# Patient Record
Sex: Female | Born: 1935 | Race: White | Hispanic: No | Smoking: Never smoker
Health system: Southern US, Community
[De-identification: ages and names within clinical notes are randomized; demographics above are authoritative.]

## PROBLEM LIST (undated history)

## (undated) DIAGNOSIS — M79602 Pain in left arm: Secondary | ICD-10-CM

## (undated) DIAGNOSIS — I10 Essential (primary) hypertension: Secondary | ICD-10-CM

## (undated) DIAGNOSIS — E785 Hyperlipidemia, unspecified: Secondary | ICD-10-CM

## (undated) DIAGNOSIS — Z90710 Acquired absence of both cervix and uterus: Secondary | ICD-10-CM

## (undated) DIAGNOSIS — M545 Low back pain, unspecified: Secondary | ICD-10-CM

## (undated) DIAGNOSIS — C439 Malignant melanoma of skin, unspecified: Secondary | ICD-10-CM

## (undated) DIAGNOSIS — F329 Major depressive disorder, single episode, unspecified: Secondary | ICD-10-CM

## (undated) DIAGNOSIS — R55 Syncope and collapse: Secondary | ICD-10-CM

## (undated) DIAGNOSIS — E119 Type 2 diabetes mellitus without complications: Secondary | ICD-10-CM

## (undated) DIAGNOSIS — F32A Depression, unspecified: Secondary | ICD-10-CM

## (undated) HISTORY — DX: Depression, unspecified: F32.A

## (undated) HISTORY — DX: Pain in left arm: M79.602

## (undated) HISTORY — DX: Low back pain, unspecified: M54.50

## (undated) HISTORY — DX: Syncope and collapse: R55

## (undated) HISTORY — DX: Type 2 diabetes mellitus without complications: E11.9

## (undated) HISTORY — DX: Malignant melanoma of skin, unspecified: C43.9

## (undated) HISTORY — DX: Acquired absence of both cervix and uterus: Z90.710

## (undated) HISTORY — DX: Major depressive disorder, single episode, unspecified: F32.9

## (undated) HISTORY — DX: Low back pain: M54.5

---

## 1971-08-11 DIAGNOSIS — Z90710 Acquired absence of both cervix and uterus: Secondary | ICD-10-CM

## 1971-08-11 HISTORY — DX: Acquired absence of both cervix and uterus: Z90.710

## 1971-08-11 HISTORY — PX: ABDOMINAL HYSTERECTOMY: SHX81

## 1983-08-11 HISTORY — PX: BREAST BIOPSY: SHX20

## 2000-01-01 ENCOUNTER — Encounter: Payer: Self-pay | Admitting: Gastroenterology

## 2000-01-01 ENCOUNTER — Ambulatory Visit (HOSPITAL_COMMUNITY): Admission: RE | Admit: 2000-01-01 | Discharge: 2000-01-01 | Payer: Self-pay | Admitting: Gastroenterology

## 2001-03-22 ENCOUNTER — Ambulatory Visit (HOSPITAL_COMMUNITY): Admission: RE | Admit: 2001-03-22 | Discharge: 2001-03-22 | Payer: Self-pay | Admitting: General Surgery

## 2001-03-22 ENCOUNTER — Encounter: Payer: Self-pay | Admitting: General Surgery

## 2001-03-23 ENCOUNTER — Encounter: Payer: Self-pay | Admitting: General Surgery

## 2001-03-25 ENCOUNTER — Encounter (INDEPENDENT_AMBULATORY_CARE_PROVIDER_SITE_OTHER): Payer: Self-pay | Admitting: *Deleted

## 2001-03-25 ENCOUNTER — Ambulatory Visit (HOSPITAL_COMMUNITY): Admission: RE | Admit: 2001-03-25 | Discharge: 2001-03-26 | Payer: Self-pay | Admitting: General Surgery

## 2001-05-26 ENCOUNTER — Encounter: Admission: RE | Admit: 2001-05-26 | Discharge: 2001-05-26 | Payer: Self-pay | Admitting: Orthopaedic Surgery

## 2001-05-26 ENCOUNTER — Encounter: Payer: Self-pay | Admitting: Orthopaedic Surgery

## 2001-06-06 ENCOUNTER — Encounter: Admission: RE | Admit: 2001-06-06 | Discharge: 2001-06-06 | Payer: Self-pay | Admitting: Orthopaedic Surgery

## 2001-06-06 ENCOUNTER — Encounter: Payer: Self-pay | Admitting: Orthopaedic Surgery

## 2001-06-16 ENCOUNTER — Encounter: Payer: Self-pay | Admitting: Orthopaedic Surgery

## 2001-06-16 ENCOUNTER — Encounter: Admission: RE | Admit: 2001-06-16 | Discharge: 2001-06-16 | Payer: Self-pay | Admitting: Orthopaedic Surgery

## 2001-06-29 ENCOUNTER — Encounter: Payer: Self-pay | Admitting: Orthopaedic Surgery

## 2001-06-29 ENCOUNTER — Encounter: Admission: RE | Admit: 2001-06-29 | Discharge: 2001-06-29 | Payer: Self-pay | Admitting: Orthopaedic Surgery

## 2001-07-13 ENCOUNTER — Encounter: Admission: RE | Admit: 2001-07-13 | Discharge: 2001-07-13 | Payer: Self-pay | Admitting: Orthopaedic Surgery

## 2001-07-13 ENCOUNTER — Encounter: Payer: Self-pay | Admitting: Orthopaedic Surgery

## 2001-08-10 HISTORY — PX: JOINT REPLACEMENT: SHX530

## 2001-08-10 HISTORY — PX: TOTAL HIP ARTHROPLASTY: SHX124

## 2001-11-03 ENCOUNTER — Encounter (INDEPENDENT_AMBULATORY_CARE_PROVIDER_SITE_OTHER): Payer: Self-pay | Admitting: *Deleted

## 2001-11-03 ENCOUNTER — Encounter: Payer: Self-pay | Admitting: Orthopaedic Surgery

## 2001-11-03 ENCOUNTER — Inpatient Hospital Stay (HOSPITAL_COMMUNITY): Admission: RE | Admit: 2001-11-03 | Discharge: 2001-11-07 | Payer: Self-pay | Admitting: Orthopaedic Surgery

## 2001-11-07 ENCOUNTER — Inpatient Hospital Stay (HOSPITAL_COMMUNITY)
Admission: RE | Admit: 2001-11-07 | Discharge: 2001-11-14 | Payer: Self-pay | Admitting: Physical Medicine & Rehabilitation

## 2001-12-27 ENCOUNTER — Encounter: Admission: RE | Admit: 2001-12-27 | Discharge: 2001-12-27 | Payer: Self-pay | Admitting: Gastroenterology

## 2001-12-27 ENCOUNTER — Encounter: Payer: Self-pay | Admitting: Gastroenterology

## 2002-02-24 ENCOUNTER — Encounter: Payer: Self-pay | Admitting: Emergency Medicine

## 2002-02-24 ENCOUNTER — Emergency Department (HOSPITAL_COMMUNITY): Admission: EM | Admit: 2002-02-24 | Discharge: 2002-02-24 | Payer: Self-pay | Admitting: Emergency Medicine

## 2002-03-21 ENCOUNTER — Ambulatory Visit (HOSPITAL_COMMUNITY): Admission: RE | Admit: 2002-03-21 | Discharge: 2002-03-21 | Payer: Self-pay | Admitting: Gastroenterology

## 2002-03-22 ENCOUNTER — Encounter: Payer: Self-pay | Admitting: Gastroenterology

## 2002-03-22 ENCOUNTER — Ambulatory Visit (HOSPITAL_COMMUNITY): Admission: RE | Admit: 2002-03-22 | Discharge: 2002-03-22 | Payer: Self-pay | Admitting: Gastroenterology

## 2002-04-20 ENCOUNTER — Ambulatory Visit (HOSPITAL_COMMUNITY): Admission: RE | Admit: 2002-04-20 | Discharge: 2002-04-20 | Payer: Self-pay | Admitting: Dermatology

## 2002-04-20 ENCOUNTER — Encounter: Payer: Self-pay | Admitting: Dermatology

## 2003-09-11 ENCOUNTER — Ambulatory Visit (HOSPITAL_COMMUNITY): Admission: RE | Admit: 2003-09-11 | Discharge: 2003-09-11 | Payer: Self-pay | Admitting: Family Medicine

## 2003-10-17 ENCOUNTER — Emergency Department (HOSPITAL_COMMUNITY): Admission: EM | Admit: 2003-10-17 | Discharge: 2003-10-17 | Payer: Self-pay | Admitting: Emergency Medicine

## 2004-12-18 ENCOUNTER — Ambulatory Visit (HOSPITAL_COMMUNITY): Admission: RE | Admit: 2004-12-18 | Discharge: 2004-12-18 | Payer: Self-pay | Admitting: Dermatology

## 2005-02-04 ENCOUNTER — Emergency Department (HOSPITAL_COMMUNITY): Admission: EM | Admit: 2005-02-04 | Discharge: 2005-02-05 | Payer: Self-pay | Admitting: Emergency Medicine

## 2005-05-25 ENCOUNTER — Encounter: Admission: RE | Admit: 2005-05-25 | Discharge: 2005-05-25 | Payer: Self-pay | Admitting: Family Medicine

## 2005-06-04 ENCOUNTER — Ambulatory Visit (HOSPITAL_COMMUNITY): Admission: RE | Admit: 2005-06-04 | Discharge: 2005-06-04 | Payer: Self-pay | Admitting: Family Medicine

## 2005-11-05 ENCOUNTER — Encounter: Admission: RE | Admit: 2005-11-05 | Discharge: 2005-11-05 | Payer: Self-pay | Admitting: Family Medicine

## 2006-04-10 ENCOUNTER — Encounter: Admission: RE | Admit: 2006-04-10 | Discharge: 2006-04-10 | Payer: Self-pay | Admitting: Family Medicine

## 2006-05-24 ENCOUNTER — Encounter: Admission: RE | Admit: 2006-05-24 | Discharge: 2006-05-24 | Payer: Self-pay | Admitting: Family Medicine

## 2006-08-24 ENCOUNTER — Encounter: Admission: RE | Admit: 2006-08-24 | Discharge: 2006-08-24 | Payer: Self-pay | Admitting: Gastroenterology

## 2006-12-01 ENCOUNTER — Ambulatory Visit (HOSPITAL_COMMUNITY): Admission: RE | Admit: 2006-12-01 | Discharge: 2006-12-01 | Payer: Self-pay | Admitting: Dermatology

## 2007-03-09 ENCOUNTER — Encounter: Admission: RE | Admit: 2007-03-09 | Discharge: 2007-03-09 | Payer: Self-pay | Admitting: Family Medicine

## 2007-03-29 IMAGING — CR DG CHEST 2V
2 series · 2 of 2 positions shown · non-contrast
Comparison: 10/17/2003.

CLINICAL DATA: Melanoma, dyspnea.  Shortness of breath. 
CHEST - TWO VIEW:

[w chest pa *]
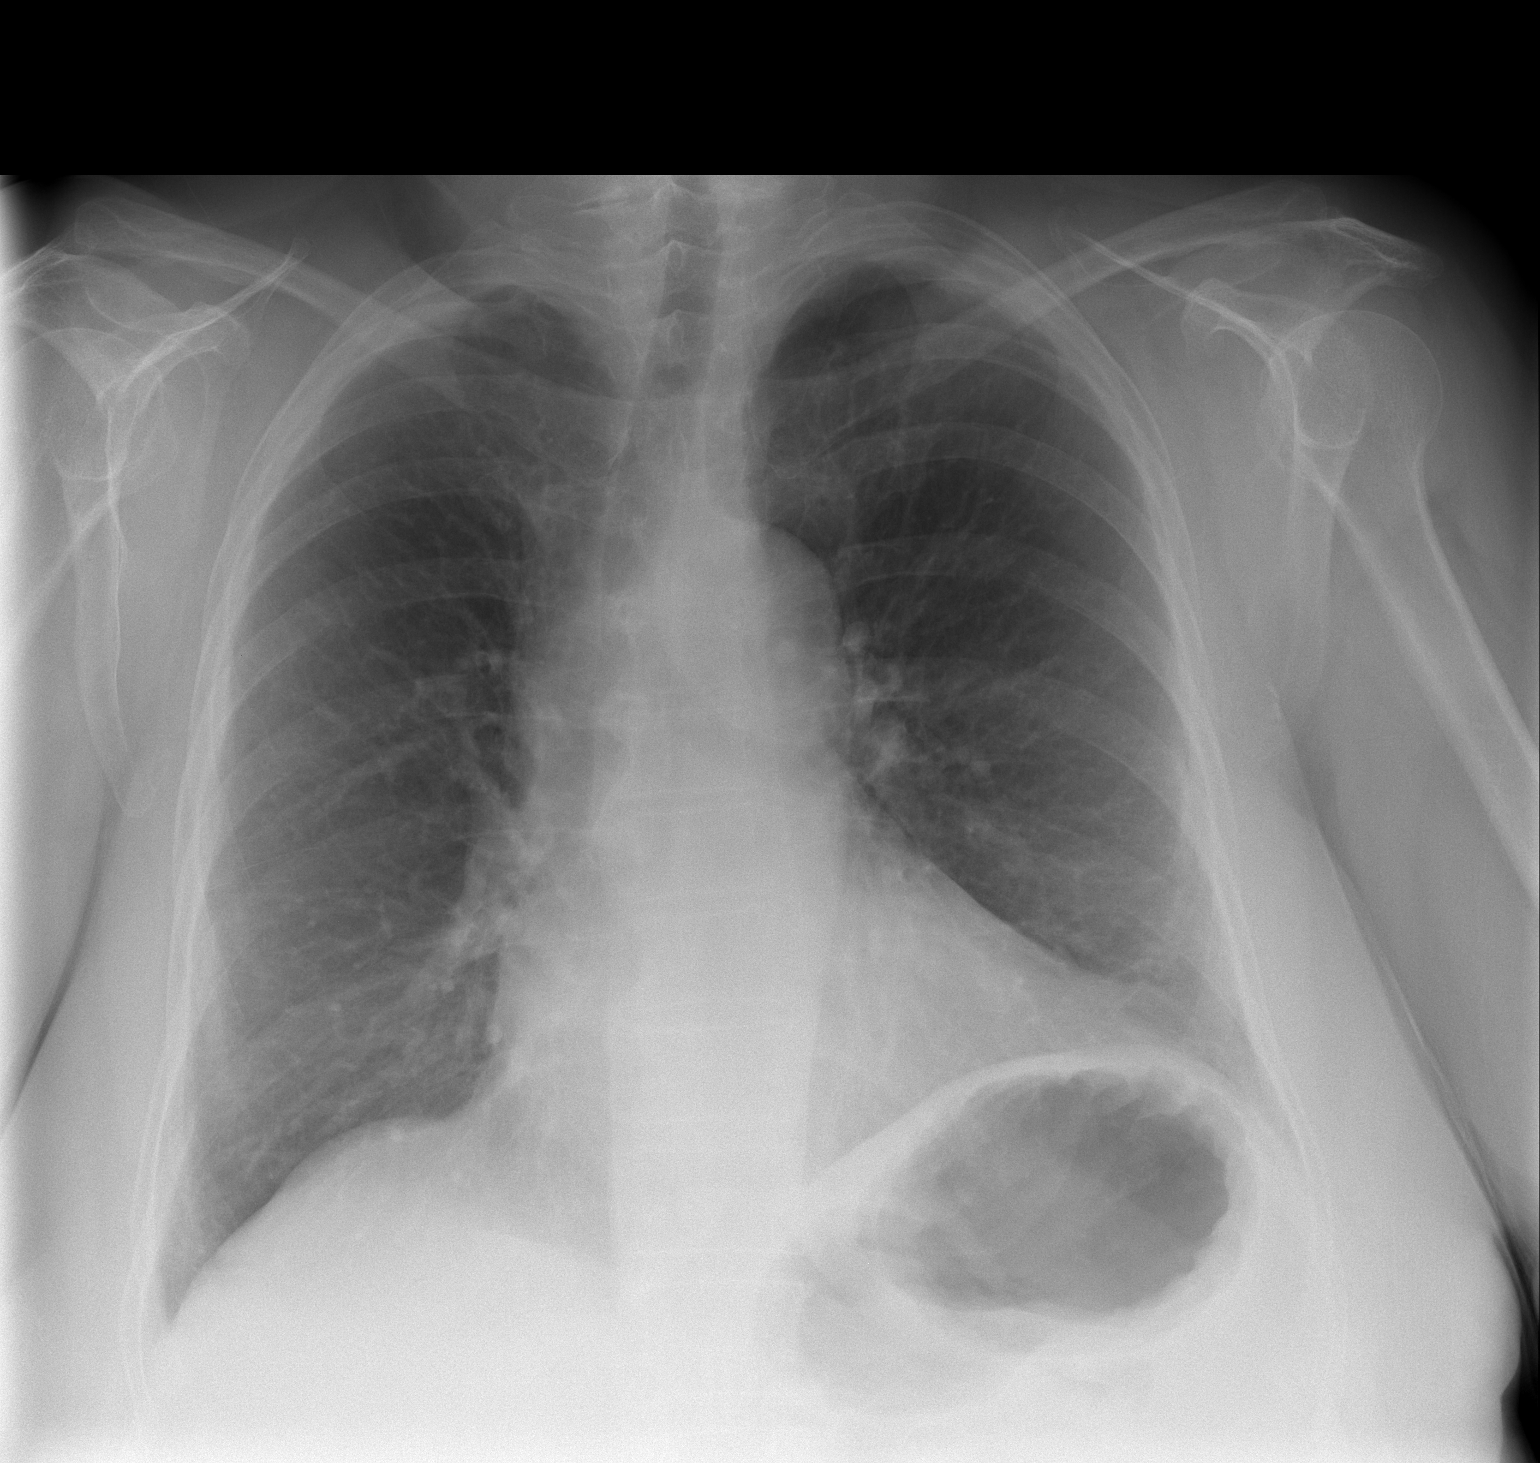

[w chest lat *]
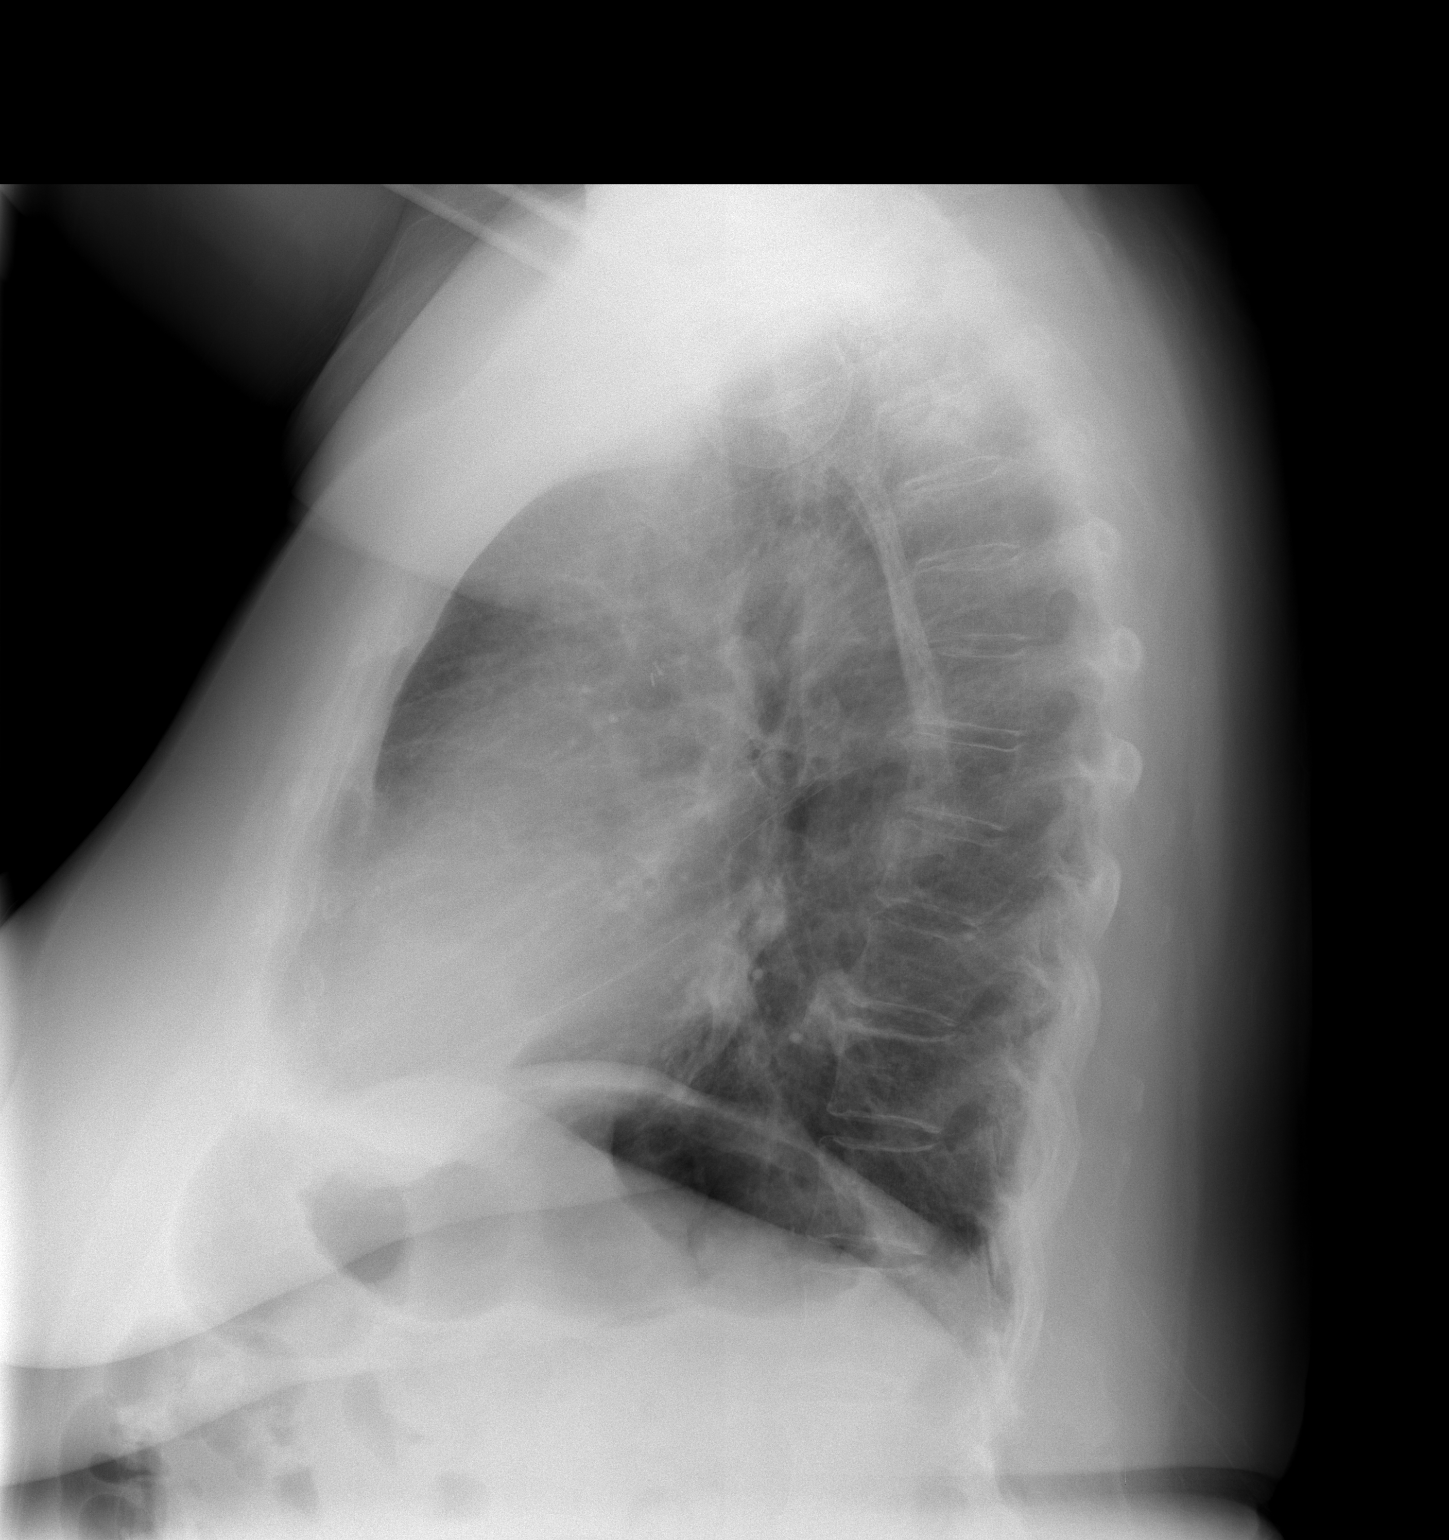

[2 of 2 positions shown; findings below may reference images not displayed]

Cardiomegaly.  Minimal chronic interstitial lung changes. No CHF and no evidence for metastatic involvement of the chest.   Slight peribronchial thickening mid and lower lung zones and suspicion for COPD.  Small nodular density projects over the aorticopulmonary window on PA view compatible with granuloma.
IMPRESSION: 1.  No acute chest disease findings.  Mild cardiomegaly. 
2.  Suspicion for COPD.

## 2007-09-06 ENCOUNTER — Encounter: Admission: RE | Admit: 2007-09-06 | Discharge: 2007-09-06 | Payer: Self-pay | Admitting: Family Medicine

## 2008-01-13 ENCOUNTER — Ambulatory Visit: Payer: Self-pay | Admitting: Internal Medicine

## 2008-01-13 DIAGNOSIS — J441 Chronic obstructive pulmonary disease with (acute) exacerbation: Secondary | ICD-10-CM

## 2008-01-13 DIAGNOSIS — R0602 Shortness of breath: Secondary | ICD-10-CM | POA: Insufficient documentation

## 2008-01-13 DIAGNOSIS — R05 Cough: Secondary | ICD-10-CM

## 2008-02-14 ENCOUNTER — Telehealth (INDEPENDENT_AMBULATORY_CARE_PROVIDER_SITE_OTHER): Payer: Self-pay | Admitting: *Deleted

## 2008-02-20 ENCOUNTER — Ambulatory Visit: Payer: Self-pay | Admitting: Internal Medicine

## 2008-02-21 ENCOUNTER — Telehealth (INDEPENDENT_AMBULATORY_CARE_PROVIDER_SITE_OTHER): Payer: Self-pay | Admitting: *Deleted

## 2008-04-03 ENCOUNTER — Ambulatory Visit: Payer: Self-pay | Admitting: Internal Medicine

## 2008-05-04 ENCOUNTER — Ambulatory Visit: Payer: Self-pay | Admitting: Internal Medicine

## 2008-06-01 ENCOUNTER — Encounter: Payer: Self-pay | Admitting: Internal Medicine

## 2008-06-11 ENCOUNTER — Ambulatory Visit: Payer: Self-pay | Admitting: Internal Medicine

## 2008-06-25 ENCOUNTER — Ambulatory Visit: Payer: Self-pay | Admitting: Internal Medicine

## 2008-07-09 ENCOUNTER — Encounter: Payer: Self-pay | Admitting: Internal Medicine

## 2008-07-10 ENCOUNTER — Encounter: Payer: Self-pay | Admitting: Internal Medicine

## 2008-07-23 ENCOUNTER — Encounter: Payer: Self-pay | Admitting: Internal Medicine

## 2008-08-10 ENCOUNTER — Encounter: Payer: Self-pay | Admitting: Internal Medicine

## 2008-09-05 ENCOUNTER — Encounter: Payer: Self-pay | Admitting: Internal Medicine

## 2008-09-26 ENCOUNTER — Encounter: Payer: Self-pay | Admitting: Internal Medicine

## 2009-12-31 ENCOUNTER — Ambulatory Visit: Payer: Self-pay | Admitting: Otolaryngology

## 2010-03-26 ENCOUNTER — Ambulatory Visit: Payer: Self-pay | Admitting: Otolaryngology

## 2010-05-01 ENCOUNTER — Ambulatory Visit: Payer: Self-pay | Admitting: Specialist

## 2010-12-26 NOTE — Op Note (Signed)
[place]. [facility]  Patient:    Sheena [name], Sheena [name] Visit Number: [id number] MRN: [id number]          Service Type: [facility] Location: [id number] Attending Physician:  Ranelle Oyster Dictated by:   Claude Manges. Cleophas Dunker, M.D. Proc. Date: 11/03/01 Admit Date:  11/07/2001                             Operative Report  PREOPERATIVE DIAGNOSIS:  End-stage osteoarthritis of the left hip.  POSTOPERATIVE DIAGNOSIS:  End-stage osteoarthritis of the left hip.  OPERATION PERFORMED:  Left total hip replacement.  SURGEON:  Claude Manges. Cleophas Dunker, M.D.  ASSISTANT: 1. Lenard Galloway. Chaney Malling, M.D. 2. Jamelle Rushing, P.A.  ANESTHESIA:  General orotracheal.  COMPLICATIONS:  None.  COMPONENTS:  Depuy Prodigy hip stem with pore-coat 12 mm diameter standard, a 32 mm outer diameter hip ball, +9 mm neck length, a pinnacle 100 series acetabular cup 54 mm outer diameter with a pinnacle Marathon acetabular liner plus 4 mm with a 10 degree liner.  DESCRIPTION OF PROCEDURE:  With the patient comfortable in the prone position on the operating table, general anesthetic was provided by anesthesia.  A Foley catheter was inserted by the nursing staff.  The patient was then placed in the lateral decubitus position with the left side up.  The patient was then secured to the operating table with the Innomed hip system.  The left hip was then prepped the iliac crest to below the knee with Betadine scrub and then DuraPrep.  Sterile draping was performed.  A routine Southern incision was utilized and via sharp dissection carried down to the subcutaneous tissue.  There was abundant adipose tissue which made the procedure technically difficult.  Bleeders were Bovie coagulated. Self-retaining retractors were inserted.  The iliotibial band was identified approximately 5 to 6 inches deep to the skin.  It was incised along the length of the skin incision.  Again retractors were inserted more  deeply.  Fatty tissue was removed bluntly from the short external rotators with the hip internally rotated with some difficulty because of the arthritis, the short external rotators were carefully incised.  The tendinous structures were tagged with 0 Tycron suture.  The capsule was identified and incised.  There was a clear yellow joint effusion.  The hip was then dislocated posteriorly.  Using the AML calcar guide, the head was then osteotomized at a point about a fingerbreadth proximal to the lesser trochanter.  The head was removed and inspected.  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  The acetabulum was inspected with evidence of considerable beefy red synovial tissue.  This was removed with a rongeur.  The femoral canal was then prepared initially with a starter hole and then the canal finder was used to obtain the appropriate angle for reaming.  Reaming was performed 11.5 mm to accept a 12 mm prosthesis.  We were very tight that point.  We had templated either the 12 or 13.5 femoral component preoperatively.  Rasping was performed 12 mm with a medial modified aspect and then with the standard, we had a very nice tight fit.  Calcar reamer was used to obtain the appropriate angle in the calcar.  Retractors were placed around the acetabulum.  The labrum was excised sharply. Care was taken throughout the procedure to protect the sciatic nerve which was palpated deeply in the tissue  but never visualized.  Reaming was performed to 53 mm outer diameter to accept a 54 mm prosthesis.  We inserted the trial 52, which had a nice rim fit but completely fit into the acetabulum and then we inserted a 54 that would not completely seat but had good rim fit.  Accordingly, the final 100 series pinnacle acetabular cup was inserted followed by the screw in liner.  We initially tried a 28 mm hip ball but felt that we were  unstable even up to a 9.5 mm neck, so we rearranged the trial components and even with the Prodigy felt that we were still a little unstable in flexion and adduction, so we repositioned the acetabular cut to a little bit more abduction.  I then reinserted all of the trial components and the final construct was a Prodigy hip stem with a 32 mm ball 9 mm in length with a +4 acetabular liner.  We felt we had established leg length discrepancy to be equal in the appropriately stable construct in both extension and flexion.  The trial components were removed.  The wound was copiously irrigated with antibiotic and saline solution.  The apex hole eliminator was inserted followed by the pinnacle Marathon acetabular liner.  The 12 mm diameter femoral component was then impacted on the calcar followed by the 9 mm neck length and 32 mm hip ball.  The entire construct was then reduced and through a full range of motion in both flexion and extension, we had a perfectly stable construct.  The wound was then irrigated with saline solution and the capsule was closed with interrupted 0 Ethibond.  Short external rotators were reapproximated with the same material.  The iliotibial band was then closed with a running 0 Vicryl, the subcutaneous closed in several layers with 0 and 2-0 Vicryl, skin closed with skin clips.  Sterile bulky dressing was applied followed by a knee immobilizer.  The patient was then placed in the supine position and transferred to the operating room stretcher without intraoperative complications. Dictated by:   Claude Manges. Cleophas Dunker, M.D. Attending Physician:  Faith Rogue T DD:  11/03/01 TD:  11/04/01 Job: [id number]

## 2010-12-26 NOTE — H&P (Signed)
[place]. [facility]  Patient:    Sheena [name], Sheena [name]. Visit Number: [id number] MRN: [id number]          Service Type: Attending:  Claude Manges. Cleophas Dunker, M.D. Dictated by:   Jamelle Rushing, P.[name]. Adm. Date:  11/03/01                           History and Physical  DATE OF BIRTH:  06/02/1924  CHIEF COMPLAINT:  Left hip pain.  HISTORY OF PRESENT ILLNESS:  The patient is [name] 77 year old white female with [name] long-term history of progressively worsening left hip pain.  The patient states that around last October her discomfort had significantly increased, causing severe difficulty with ambulation.  The pain is located directly in the left groin.  She does have [name] constant aching sensation in there with some sharp shooting pain with awkward range of motion.  She denies any mechanical symptoms.  She does have pain that does radiate down the entire leg.  She does have significant problems with night pain for which she uses Darvocet to get [name] good nights sleep.  She has been using [name] cane and [name] walker to assist ambulation.  The patient has had bone scan which shows [name] significant degenerate reaction in the left hip.  The patient has donated two units of autologous blood.  CURRENT MEDICATIONS: 1. Zocor 20 mg p.o. q.d. 2. Aspirin 81 mg p.o. q.d. 3. Celebrex 200 mg p.o. q.d. 4. Darvocet p.r.n. 5. Lorazepam 0.5 mg p.r.n. 6. Albuterol MDI p.r.n.  DRUG ALLERGIES:  No known drug allergies.  PAST MEDICAL HISTORY:  The patient does have occasional problems with seasonal-related asthma.  She uses her inhalers.  She has not been hospitalized with this problem.  The patient does have [name] problem with [name] hiatal hernia and some reflux for which she uses Prilosec on [name] p.r.n. basis.  The patient denies any history of diabetes, hypertension, thyroid disease, peptic ulcers, or heart disease.  PAST SURGICAL HISTORY: 1. Fibroid tumor and hemorrhoids removed in 1961. 2. Hysterectomy in  1973. 3. Foot surgery in 1990. 4. Melanoma removal in 2002.  The patient denies any complications with the above-mentioned surgical procedures.  SOCIAL HISTORY:  The patient is [name] 77 year old, centrally obese, white female. She denies any history of smoking or alcohol use.  The patient is currently married.  She lives with her husband in [name] two-story house with all living facilities on the first floor.  The patient is currently retired.  Her family physician is Reola Mosher. Randa Lynn, M.D., with the [facility].  FAMILY MEDICAL HISTORY:  Her mother is deceased from heart disease and complications of diabetes.  Her father is deceased from kidney disease and complications of diabetes.  The patient has one sister alive with [name] history of hypertension, cardiac disease, thyroid disease, and diabetes.  REVIEW OF SYSTEMS:  Positive for partial upper dentures.  She does use glasses at all times.  She does have occasional shortness of breath due to her obesity and severe difficulty ambulating and exercising due to her hip pain.  She does have [name] recent weight loss of 18 pounds since last January with [name] change in diet.  She does have [name] problem with reflux disease for which she uses Prilosec on [name] p.r.n. basis.  She does have occasional problems with urinary urgency. Otherwise the review of systems is negative in all other categories.  PHYSICAL EXAMINATION:  Height is 5  feet 6 inches.  Weight is 214 pounds.  VITAL SIGNS:  The pulse is 72 and regular, respirations 16, temperature 98.0 degrees, and blood pressure 150/74.  GENERAL APPEARANCE:  This patient is [name] healthy-appearing, well-developed, centrally obese, white female.  She does ambulate rate slowly with [name] significant limp and limited range of motion due to her left hip.  She ambulates with the use of [name] cane, but she is able to get on and off of the exam table by herself without any significant difficulty.  HEENT:  Normocephalic and atraumatic.   Nontender over the maxillary and frontal sinuses.  The pupils are equal, round, reactive, and accommodating to light.  Extraocular movements are intact.  The sclerae are not icteric.  The conjunctivae are pink and moist.  External ears without deformities.  Canals patent.  TMs pearly gray and intact.  Gross hearing is intact.  The nasal septum was midline.  Mucous membranes pink and moist.  No polyps noted.  The oral buccal mucosa was pink and moist without lesions.  An upper partial plate was in place.  The rest of the dentition was in fair repair.  The uvula was midline.  NECK:  Supple.  No palpable lymphadenopathy.  The thyroid gland was nontender. The patient had good range of motion of her cervical spine without any difficulty or tenderness.  The rest of her thoracic and lumbar spine was nontender to percussion.  CHEST:  Lung sounds were clear and equal bilaterally.  No wheezes, rales, rhonchi, or rubs noted.  HEART:  Regular rate and rhythm.  S1 and S2 are auscultated.  ABDOMEN:  Round, obese, soft, and nontender to deep palpation.  She had normoactive bowel sounds throughout.  Unable to palpate any hepatosplenomegaly.  CVA was nontender to percussion.  EXTREMITIES:  The upper extremities were symmetric in size and shape.  She had good range of motion of her shoulders, elbows, and wrists with 5/5 motor strength in all muscle groups tested.  In the lower extremities, the patient had 5 degrees short of full extension and flexion of 90 degrees of her left hip.  She had 10 degrees external rotation and no internal rotation, limited by mechanical discomfort.  The right hip had full extension.  She was able to flex up to 115 degrees limited mostly by obesity.  She had 20 degrees of internal and external rotation without discomfort.  Bilateral knees were symmetrical.  There was no sign of erythema or ecchymosis.  She had no palpable effusion.  Medial and lateral joint lines were  nontender.  She had no instability in her knees.  She had  excellent range of motion.  Bilateral ankles were symmetrical with good dorsiflexion and plantar flexion.  PERIPHERAL VASCULATURE:  Carotid pulses 2+ with no bruits.  Radial pulses 2+. Unable to palpate femoral pulses.  Dorsalis pedis and posterior tibial pulses were 1+ with no overt extremity edema or venous stasis changes.  She did have some varicosities.  NEUROLOGIC:  The patient was conscious, alert, and appropriate.  She held an easy conversation with the examiner.  Cranial nerves II-XII were grossly intact.  Deep tendon reflexes of the upper and lower extremities were symmetrical right to left.  The patient was grossly intact to light touch sensation from head to toe.  BREASTS:  Deferred at this time.  RECTAL:  Deferred at this time.  GENITOURINARY:  Deferred at this time.  IMPRESSION: 1. Left hip osteoarthritis. 2. Obesity. 3. Hiatal hernia with reflux. 4.  Asthma.  PLAN:  The patient will be admitted to [facility]. [facility] on November 03, 2001, under the care of Claude Manges. Cleophas Dunker, M.D.  The patient will undergo all routine labs and tests prior to having [name] left total hip arthroplasty.  The patient has donated two units of autologous blood for this procedure. Dictated by:   Jamelle Rushing, P.[name]. Attending:  Claude Manges. Cleophas Dunker, M.D. DD:  10/26/01 TD:  10/27/01 Job: [id number]

## 2010-12-26 NOTE — Discharge Summary (Signed)
[place]. [facility]  Patient:    Sheena [name], Sheena [name] Visit Number: [id number] MRN: [id number]          Service Type: [facility] Location: [id number] Attending Physician:  Faith Rogue T Dictated by:   Mcarthur Rossetti. Angiulli, P.A. Admit Date:  11/07/2001 Discharge Date: 11/14/2001   CC:         Dr. Angelina Ok, M.D.   Discharge Summary  DISCHARGE DIAGNOSES: 1. Left total hip replacement on November 03, 2001 secondary to osteoarthritis. 2. Coumadin for deep vein thrombosis prophylaxis. 3. Anemia. 4. Gastroesophageal reflux disease. 5. Seasonal asthma. 6. Hyperlipidemia.  HISTORY OF PRESENT ILLNESS: The patient is a 77 year old white female admitted on November 03, 2001 with end-stage osteoarthritis of the left hip and no relief with conservative care. She underwent a left total hip replacement on November 03, 2001 per Dr. Cleophas Dunker and placed on Coumadin for deep vein thrombosis prophylaxis and advised 50% partial weight bearing.  Postoperative anemia and transfused on November 05, 2001. Hypokalemia of 3.3 and supplemented X2 days. There was no chest pain or shortness of breath.  Minimal assistance for ambulation. Contact guard assist bed mobility. Latest INR 1.7, hemoglobin 9.1. Chest x-ray negative. Admitted for comprehensive rehab program.  PAST MEDICAL HISTORY: See discharge diagnosis.  PAST SURGICAL HISTORY: 1. Hemorrhoid surgery. 2. Hysterectomy. 3. Foot surgery. 4. Melanoma removed in 2002.  ALLERGIES: None.  SOCIAL HISTORY: No alcohol or tobacco.  FAMILY HISTORY: Lives with husband in [place]. Independent prior to admission.  Lives in 2 level home with 3 steps to entry. Stays on the first floor. Husband to assist on discharge. There is also a daughter in [place].  PRIMARY PHYSICIAN: Dr. Alonna Buckler of [facility].  ADMISSION MEDICATIONS: Prior to admission included Zocor, aspirin, Celebrex, Darvocet, Lorazepam, and Albuterol  inhaler as needed.  HOSPITAL COURSE: The patient did well on rehabilitation services with therapy as initiated on a b.i.d. basis. The following issues were followed during patients rehab course.  Pertaining to Ms. Pattersons left total hip replacement, it remained stable. She was followed by orthopaedic services. She continued to have some serous drainage from the hip. She was placed on Keflex for wound coverage per orthopaedic services. On November 09, 2001 [place] straps were added to keep dressing intact. There were no obvious signs of hematoma.  DISPOSITION: She will continue on Coumadin for deep vein thrombosis prophylaxis, to complete protocol, and be followed by [facility]. She was ambulating with a walker, 50% partial weight bearing. Essentially independent standby assist in all areas of activities of daily living and dressing, grooming, and home making. Overall her strength endurance greatly improved as she was encouraged of her progress and discharged to home with home health therapies.  Her postop anemia revealed latest hemoglobin of 9.8, hematocrit 28.  She continued on an iron supplement.  Blood pressures were controlled.  She will be continued on her iron supplement as advised. She was maintained on her Zocor for hyperlipidemia. Once her Coumadin was completed, her aspirin would be resumed as prior to hospital admission.  LABORATORY:  Latest labs showed an INR of 2.7, hemoglobin 9.8, hematocrit 28.1, sodium 137, potassium 3.9, BUN 7, creatinine 0.8.  DISCHARGE MEDICATIONS: 1. Coumadin daily, dose to be established at the time of discharge, with    latest dose of Coumadin 2.5 mg. 2. Trinsicon b.i.d. 3. Zocor 20 mg at bedtimes. 4. Keflex 500 mg q.i.d. X10 days. 5. Tylox as needed for pain.  6. Albuterol inhaler 4 times daily as needed, as prior to hospital admission.  ACTIVITY: 50% partial weight bearing with total hip  precautions.  DIET:  Regular.  WOUND CARE: Dry dressing to left hip. Home health nurse to follow-up on wound care.  SPECIAL INSTRUCTIONS: Home health physical and occupational therapy. Home health nurse for prothrombin time on Wednesday, November 16, 2001, to be followed by [facility]. The patient would resume aspirin therapy after Coumadin is completed.  FOLLOW-UP: Call for appointment with Dr. Cleophas Dunker, Orthopaedic Services;  Dr. Alonna Buckler of [facility] will continue to follow medical management. Dictated by:   Mcarthur Rossetti. Angiulli, P.A. Attending Physician:  Faith Rogue T DD:  11/14/01 TD:  11/14/01 Job: [id number]

## 2010-12-26 NOTE — Discharge Summary (Signed)
[place]. [facility]  Patient:    KANG, ISHIDA Visit Number: [id number] MRN: [id number]          Service Type: [facility] Location: [phone] Attending Physician:  Faith Rogue T Dictated by:   Jamelle Rushing, P.A. Admit Date:  11/07/2001 Discharge Date: 11/14/2001   CC:         Alonna Buckler, M.D., [facility]   Discharge Summary  ADMISSION DIAGNOSES: 1. Severe left hip osteoarthritis. 2. Obesity. 3. Hiatal hernia with reflux. 4. Asthma.  DISCHARGE DIAGNOSES: 1. Left total hip arthroplasty. 2. Postoperative blood loss anemia. 3. Hypokalemia. 4. Obesity. 5. History of hiatal hernia with reflux. 6. Asthma.  HISTORY OF PRESENT ILLNESS:  The patient is a 77 year old white female with several years of left groin pain with any type of ambulation.  The pain has significantly worsened over the last four months making ambulation very difficult.  The patient is currently having to use a walker and cane to assist ambulation.  The pain is described as a deep aching sensation with sharp shooting pains down the leg with awkward movement.  The patient denies any mechanical symptoms.  The patient does have pain at night.  ALLERGIES:  No known drug allergies.  CURRENT MEDICATIONS: 1. Zocor 20 mg p.o. q.d. 2. Aspirin 81 mg p.o. q.d. 3. Celebrex 200 mg p.o. q.d. 4. Darvocet p.r.n. 5. Lorazepam 0.5 mg p.r.n. 6. Albuterol MDI p.r.n.  SURGICAL PROCEDURE:  On November 03, 2001 the patient was taken to the OR by Dr. Norlene Campbell. He was assisted by Dr. Lenard Galloway. Mortenson and Jamelle Rushing, P.A.-C.  Under general anesthesia, the patient underwent a left total hip replacement with the following components:  A DePuy Prodigy hip stem with pour coat 12 mm diameter standard, a 32 mm outer diameter hip ball with a +9 neck length, pinnacle 100 series acetabular cup, 54 mm outer diameter with a pinnacle marathon acetabular liner plus 4 mm with a 10 degree  liner.  The patient tolerated this procedure well.  No drains were left in place and the patient was transferred to the recovery room and then to the orthopedic floor in good condition.  CONSULTS:  The following routine consults were requested:  Physical therapy, occupational therapy, case management, rehab, and pharmacy for routine DVT prophylaxis with Coumadin.  HOSPITAL COURSE:  On November 03, 2001 the patient was admitted to [facility] under the care of Dr. Norlene Campbell.  The patient was taken to the OR where a left total hip arthroplasty was performed.  The patient tolerated this procedure well and was transferred to the recovery room and then to the orthopedic floor for routine postoperative care.  The patient was started on Coumadin for routine DVT prophylaxis.  The patient incurred a total of four days postoperative care on the orthopedic floor in which the patients only significant untoward event was some postoperative blood loss anemia, where the patients H&H dropped to 8.7/25.3. The patient was typed and crossed and transfused two units of autologous blood.  The patient received this blood without any problems and the patient was able to continue with her routine orthopedic recovery.  The patients vital signs otherwise remained stable. She had some low grade temperatures but nothing significant. Her wound remained benign for any signs of infection. Her leg remained neuromotor vascularly intact.  The patient initially was controlled pain wise with a PCA but that was discontinued on postoperative day two and transferred  over to just p.o. pain medications and this worked very well.  The patient worked well with physical therapy but due to her home social situation and the necessity to be fairly independent on discharge, the patient was evaluated and accepted on the rehab floor and discharged to that unit on postoperative day four in good  condition.  LABORATORY DATA:  CBC on November 06, 2001:  WBC 8.4, hemoglobin 9.1, hematocrit 26.5, platelets 126.  November 07, 2001, PT was 17.7 with an INR of 1.7.  November 06, 2001 routine chemistry showed sodium of 132, potassium 3.3 improving with p.o. replacement, glucose 111, BUN 7, creatinine 0.8.  Routine urinalysis on admission was negative.  Blood cultures showed no growth.  The patient received a total of two units of autologous blood.  DISCHARGE MEDICATIONS FROM ORTHOPEDIC FLOOR:  1. Colace 100 mg p.o. b.i.d.  2. Trinsicon one tablet p.o. t.i.d.  3. Zocor 20 mg p.o. q.d.  4. Laxative or enema of choice p.r.n.  5. Potassium chloride 20 mEq p.o. b.i.d.  6. Coumadin 4 mg p.o. q.d. for an INR of 2.0.  7. Percocet one or two tablets every four to six hours p.r.n. pain.  8. Phenergan 25 mg p.o. q.6h. p.r.n.  9. Tylenol 650 mg p.o. q.4h. p.r.n. 10. Restoril 30 mg p.o. q.h.s. p.r.n. 11. Albuterol MDI one puff p.r.n.  DISCHARGE INSTRUCTIONS:  MEDICATIONS:  The patient to continue routine medications as dispensed on orthopedic floor.  ACTIVITY:  The patient may be 50% weight bearing on the left leg with the use of a walker and close supervision.  DIET:  No restrictions.  WOUND CARE:  The patient is to have wound checked daily for any signs of infection.  Staples are to be removed on postoperative day #14.  SPECIAL INSTRUCTIONS:  The patient is to continue Coumadin for a total of 30 days with an INR of 2.0 to 2.5.  The patient is to have a follow up with Dr. Tomasa Blase two weeks from date of discharge from the orthopedic rehab floor.  CONDITION ON DISCHARGE FROM ORTHOPEDIC FLOOR:  Listed as improved and good. Dictated by:   Jamelle Rushing, P.A. Attending Physician:  Faith Rogue T DD:  12/19/01 TD:  12/20/01 Job: [id number]

## 2010-12-26 NOTE — Op Note (Signed)
[facility]  Patient:    Sheena [name], Sheena [name]                     MRN: [id number] Proc. Date: 03/25/01 Adm. Date:  [id number] Disc. Date: [id number] Attending:  Tempie Donning CC:         Hope M. Danella Deis, M.D.   Operative Report  PREOPERATIVE DIAGNOSIS:  Melanoma, superficial spreading, 1.27 mm deep (Clarks IV).  POSTOPERATIVE DIAGNOSES: 1. Melanoma, superficial spreading, 1.27 mm deep (Clarks level 4). 2. One sentinel node.  PROCEDURES:  Wide excision, level IV melanoma left arm, and left axillary lymph node dissection.  SURGEON:  Gita Kudo, M.D.  ANESTHESIA:  General.  CLINICAL SUMMARY:  This 77 year old female is brought in for surgery.  She had excision of a lesion of her arm that turned out to be a 1.27 mm melanoma.  She has undergone lymphoscintigraphy, showing a solitary node in the left axilla. Preoperatively she was injected in the nuclear medicine division with sulfur colloid.  OPERATIVE FINDINGS:  There was one large hot node noted in the left axilla. The tumor was widely excised from her left arm after mapping out the incision with a measuring device.  DESCRIPTION OF PROCEDURE:  Under satisfactory general endotracheal anesthesia, the patients left arm biopsy site was infiltrated with 4 cc of methylene blue at two separate areas in the dermis.  It was then prepped and draped in a standard fashion to allow for axillary dissection.  The NeoProbe was used preop to localize the area, and this was confirmed after prepping and used continuously intraoperatively.  A transverse incision made in her lowest axillary fold and then the incision deepened to the pectoralis muscle, which was retracted medially.  Using the probe as well as following blue lymphatics, I identified one large, hot blue node, and its count was approximately 500. After it was removed, the count remained at 500 in the node and the axilla itself had no further  activity.  Accordingly, it was sent for pathology and the wound lavaged with saline and approximated with 3-0 Vicryl followed by staples for the skin.  The patient was then positioned, prepped and draped for wide excision of her skin lesion.  A 1.5 cm margin was drawn all around, and then an elliptical incision oriented in an oblique fashion to accommodate this was performed. This was then carried down through the skin and subcutaneous tissue into the fat and then the lesion removed using cautery down to the muscle fascia but not excising it.  This wound was then closed with interrupted 3-0 nylon figure-of-eight sutures followed by multiple metal staples.  Then sterile absorbent compressive dressings were applied and the patient went to the recovery room from the operating room in good condition without complication. DD:  03/25/01 TD:  03/26/01 Job: [id number]

## 2011-02-20 ENCOUNTER — Encounter: Payer: Self-pay | Admitting: Internal Medicine

## 2011-03-11 ENCOUNTER — Encounter: Payer: Self-pay | Admitting: Internal Medicine

## 2011-04-09 ENCOUNTER — Other Ambulatory Visit: Payer: Self-pay | Admitting: *Deleted

## 2011-04-10 NOTE — Telephone Encounter (Signed)
Opened in error

## 2011-04-27 ENCOUNTER — Other Ambulatory Visit: Payer: Self-pay | Admitting: Internal Medicine

## 2011-04-27 MED ORDER — BENZONATATE 200 MG PO CAPS: 200.0000 mg | ORAL_CAPSULE | Freq: Three times a day (TID) | ORAL | Status: DC | PRN

## 2011-05-08 ENCOUNTER — Other Ambulatory Visit: Payer: Self-pay | Admitting: Internal Medicine

## 2011-05-11 MED ORDER — ATORVASTATIN CALCIUM 40 MG PO TABS: 40.0000 mg | ORAL_TABLET | Freq: Every day | ORAL | Status: DC

## 2011-05-11 MED ORDER — LISINOPRIL-HYDROCHLOROTHIAZIDE 20-25 MG PO TABS: 1.0000 | ORAL_TABLET | Freq: Every day | ORAL | Status: DC

## 2011-06-22 ENCOUNTER — Other Ambulatory Visit: Payer: Self-pay | Admitting: Internal Medicine

## 2011-06-23 ENCOUNTER — Other Ambulatory Visit: Payer: Self-pay | Admitting: Internal Medicine

## 2011-06-23 MED ORDER — ZOLPIDEM TARTRATE 10 MG PO TABS: 10.0000 mg | ORAL_TABLET | Freq: Every evening | ORAL | Status: DC | PRN

## 2011-06-23 MED ORDER — ZOLPIDEM TARTRATE 5 MG PO TABS: 5.0000 mg | ORAL_TABLET | Freq: Every evening | ORAL | Status: DC | PRN

## 2011-06-29 ENCOUNTER — Other Ambulatory Visit: Payer: Self-pay | Admitting: Internal Medicine

## 2011-07-09 LAB — HM MAMMOGRAPHY: HM Mammogram: NORMAL

## 2011-07-27 ENCOUNTER — Other Ambulatory Visit: Payer: Self-pay | Admitting: *Deleted

## 2011-07-27 MED ORDER — ESCITALOPRAM OXALATE 10 MG PO TABS: 10.0000 mg | ORAL_TABLET | Freq: Every day | ORAL | Status: DC

## 2011-07-27 MED ORDER — ZOLPIDEM TARTRATE 10 MG PO TABS: 10.0000 mg | ORAL_TABLET | Freq: Every evening | ORAL | Status: DC | PRN

## 2011-07-31 NOTE — Telephone Encounter (Signed)
Rx for Remus Loffler called in.

## 2011-08-07 ENCOUNTER — Telehealth: Payer: Self-pay | Admitting: *Deleted

## 2011-08-07 MED ORDER — LOSARTAN POTASSIUM-HCTZ 50-12.5 MG PO TABS: 1.0000 | ORAL_TABLET | Freq: Every day | ORAL | Status: DC

## 2011-08-07 NOTE — Telephone Encounter (Signed)
Pt's daughter is requesting a refill on losartan for the patient.  She says you have prescribed this for her, but it's not on her med list.  Uses [facility] [address]

## 2011-08-07 NOTE — Telephone Encounter (Signed)
Losartan/hctz was in chart and refilled.

## 2011-08-07 NOTE — Telephone Encounter (Signed)
Addended by: Duncan Dull on: 08/07/2011 03:58 PM   Modules accepted: Orders

## 2011-08-12 ENCOUNTER — Other Ambulatory Visit: Payer: Self-pay | Admitting: *Deleted

## 2011-08-12 NOTE — Telephone Encounter (Signed)
Faxed refill request from [facility], last filled 07/09/11.

## 2011-08-13 MED ORDER — LISINOPRIL-HYDROCHLOROTHIAZIDE 20-25 MG PO TABS: 1.0000 | ORAL_TABLET | Freq: Every day | ORAL | Status: DC

## 2011-08-13 MED ORDER — SITAGLIPTIN PHOS-METFORMIN HCL 50-500 MG PO TABS: 1.0000 | ORAL_TABLET | Freq: Two times a day (BID) | ORAL | Status: DC

## 2011-09-01 ENCOUNTER — Ambulatory Visit (INDEPENDENT_AMBULATORY_CARE_PROVIDER_SITE_OTHER): Payer: 59 | Admitting: Internal Medicine

## 2011-09-01 ENCOUNTER — Encounter: Payer: Self-pay | Admitting: Internal Medicine

## 2011-09-01 VITALS — BP 120/86 | HR 94 | Temp 98.6°F | Resp 16 | Wt 251.5 lb

## 2011-09-01 DIAGNOSIS — E669 Obesity, unspecified: Secondary | ICD-10-CM | POA: Insufficient documentation

## 2011-09-01 DIAGNOSIS — F329 Major depressive disorder, single episode, unspecified: Secondary | ICD-10-CM

## 2011-09-01 DIAGNOSIS — E119 Type 2 diabetes mellitus without complications: Secondary | ICD-10-CM | POA: Insufficient documentation

## 2011-09-01 DIAGNOSIS — F323 Major depressive disorder, single episode, severe with psychotic features: Secondary | ICD-10-CM | POA: Insufficient documentation

## 2011-09-01 DIAGNOSIS — E1159 Type 2 diabetes mellitus with other circulatory complications: Secondary | ICD-10-CM | POA: Insufficient documentation

## 2011-09-01 DIAGNOSIS — R0602 Shortness of breath: Secondary | ICD-10-CM

## 2011-09-01 DIAGNOSIS — G4710 Hypersomnia, unspecified: Secondary | ICD-10-CM

## 2011-09-01 DIAGNOSIS — R5383 Other fatigue: Secondary | ICD-10-CM

## 2011-09-01 DIAGNOSIS — G4700 Insomnia, unspecified: Secondary | ICD-10-CM | POA: Insufficient documentation

## 2011-09-01 LAB — COMPREHENSIVE METABOLIC PANEL
Alkaline Phosphatase: 79 U/L (ref 39–117)
Glucose, Bld: 137 mg/dL — ABNORMAL HIGH (ref 70–99)
Sodium: 136 mEq/L (ref 135–145)
Total Bilirubin: 0.4 mg/dL (ref 0.3–1.2)
Total Protein: 6.9 g/dL (ref 6.0–8.3)

## 2011-09-01 LAB — CBC WITH DIFFERENTIAL/PLATELET
Basophils Relative: 0.5 % (ref 0.0–3.0)
Eosinophils Relative: 2.1 % (ref 0.0–5.0)
HCT: 33.5 % — ABNORMAL LOW (ref 36.0–46.0)
Lymphs Abs: 1.4 10*3/uL (ref 0.7–4.0)
MCV: 92.9 fl (ref 78.0–100.0)
Monocytes Absolute: 0.7 10*3/uL (ref 0.1–1.0)
RBC: 3.61 Mil/uL — ABNORMAL LOW (ref 3.87–5.11)
WBC: 7.9 10*3/uL (ref 4.5–10.5)

## 2011-09-01 LAB — HEMOGLOBIN A1C: Hgb A1c MFr Bld: 7.3 % — ABNORMAL HIGH (ref 4.6–6.5)

## 2011-09-01 MED ORDER — LOSARTAN POTASSIUM-HCTZ 50-12.5 MG PO TABS: 1.0000 | ORAL_TABLET | Freq: Every day | ORAL | Status: DC

## 2011-09-01 MED ORDER — ESCITALOPRAM OXALATE 10 MG PO TABS: 20.0000 mg | ORAL_TABLET | Freq: Every day | ORAL | Status: DC

## 2011-09-01 NOTE — Progress Notes (Signed)
Subjective:    Patient ID: Sheena [name], female    DOB: [birth year], 78 y.o.   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 Her daughter Sheena [name] has been acting aloof and  withdrawn and spending more and more time in her bedroom despite her husband being bedridden and on hospice. She has been driving and according to concerned friends of family, her driving skills have become poor.  She has been  sleeping all day and then unable to sleep at night despite use of hypnotics.  She has been less and less involved with her husband despite his persistent decline. She has been eating a lot of  sweets and according  to several friends and family her driving skills have deteriorated and her daughter has noticed several new dents in her new car. The patient denies getting lost in her car and denies most of the observations that the daughter has made.   Past Medical History  Diagnosis Date  . H/O: hysterectomy 1973    fibroids  . Lumbago   . Left arm pain   . Diabetes mellitus, type 2   . Melanoma     left arm, resected, annual CXR's ordered  . Asthma     treated by Dr. [place] Callas  d  Current Outpatient Prescriptions on File Prior to Visit  Medication Sig Dispense Refill  . atorvastatin (LIPITOR) 40 MG tablet Take 1 tablet (40 mg total) by mouth daily.  30 tablet  3  . lisinopril-hydrochlorothiazide (PRINZIDE,ZESTORETIC) 20-25 MG per tablet Take 1 tablet by mouth daily.  30 tablet  6  . sitaGLIPtan-metformin (JANUMET) 50-500 MG per tablet Take 1 tablet by mouth 2 (two) times daily with a meal.  60 tablet  6    Review of Systems  Constitutional: Negative for fever, chills and unexpected weight change.  HENT: Negative for hearing loss, ear pain,  nosebleeds, congestion, sore throat, facial swelling, rhinorrhea, sneezing, mouth sores, trouble swallowing, neck pain, neck stiffness, voice change, postnasal drip, sinus pressure, tinnitus and ear discharge.   Eyes: Negative for pain, discharge, redness and visual disturbance.  Respiratory: Negative for cough, chest tightness, shortness of breath, wheezing and stridor.   Cardiovascular: Negative for chest pain, palpitations and leg swelling.  Musculoskeletal: Negative for myalgias and arthralgias.  Skin: Negative for color change and rash.  Neurological: Negative for dizziness, weakness, light-headedness and headaches.  Hematological: Negative for adenopathy.       Objective:   Physical Exam  Constitutional: She is oriented to person, place, and time. She appears well-developed and well-nourished.  HENT:  Mouth/Throat: Oropharynx is clear and moist.  Eyes: EOM are normal. Pupils are equal, round, and reactive to light. No scleral icterus.  Neck: Normal range of motion. Neck supple. No JVD present. No thyromegaly present.  Cardiovascular: Normal rate, regular rhythm, normal heart sounds and intact distal pulses.   Pulmonary/Chest: Effort normal and breath sounds normal.  Abdominal: Soft. Bowel sounds are normal. She exhibits no mass. There is no tenderness.  Musculoskeletal: Normal range of motion. She exhibits no edema.  Lymphadenopathy:    She has no cervical adenopathy.  Neurological: She is alert and oriented to person, place, and time.  Skin: Skin is warm and dry.  Psychiatric: She has a  normal mood and affect.      Assessment & Plan:   SHORTNESS OF BREATH (SOB) Her nocturnal gasping and exertional dyspnea may be due to untreated sleep apnea versus congestive heart failure. She has no signs of congestive heart failure on exam but does have central abdominal obesity which may be causing restrictive lung disease with supine position. I have ordered an overnight pulse oximetry  chest to assess her oxygenation at night. If this is normal we'll consider stress test or echocardiogram to evaluate systolic function.  Insomnia, persistent Her current complaints of insomnia are due to an altered sleep cycle. She is retreating to her bedroom during the day to avoid spending time with her husband who is dying on hospice. As a consequence she is unable to sleep at night because she sleeps all day long. I cannot prescribe any more medications for insomnia when the problem is patient's reluctance to engage in quality time with her husband. We spent 20 minutes discussing the fact that she needs to be more actively involved in this part of his life before tonight. I suggested that she collect his favorite readings and read to him during the day. This will not only provide her an activity that will keep her awake but will also assuage any guilt or that she may develop in the future over not spending time with him. She is also admonished and advised to walk for 20 minutes daily with or without her family whenever she can whenever she feels sleepy. I suggested that she take no more than a 20 minute during the day. A total of 60 minutes was spent with this patient today in evaluation and counselling.   Depression Neurologic exam was normal in Mini-Mental Status was actually quite good. Figure her current erratic behavior is due to worsening depression and anxiety. She has been taking Lexapro 10 mg daily for treatment of anxiety. Her current level of inactivity and disengagement from husband suggests that she is not clinically depressed. Will increase Lexapro to 20 mg daily and have patient return in one month for followup.  Obesity (BMI 30-39.9) She has gained considerable weight since her husband's decline in his is resulted in hospice care. She is not walking daily and is requiring increasing assistance at home due to inactivity. I have recommended that she take a 15 minute walk daily with her  daughter and caregiver and have recommended that daughter remove the ice cream and candy from the house and force her to  Adopt a healthy eating style.  Diabetes mellitus type 2 in obese Her diabetes has become less controlled due to her inacitivity and eating habits.  hgba1c is now 7.3.  NO medication changes today but have recommended that she stop eating candy and ice cream every night.     Updated Medication List Outpatient Encounter Prescriptions as of 09/01/2011  Medication Sig Dispense Refill  . aspirin 81 MG tablet Take 81 mg by mouth daily.      Marland Kitchen atorvastatin (LIPITOR) 40 MG tablet Take 1 tablet (40 mg total) by mouth daily.  30 tablet  3  . Azelastine HCl (ASTEPRO) 0.15 % SOLN One inhalation 2 times daily in each nostril      . beta carotene w/minerals (OCUVITE) tablet Take 1 tablet by mouth daily.      . Calcium Carbonate (CALCIUM 500 PO) Take by mouth.      . carisoprodol (SOMA) 350 MG tablet Take 350 mg by mouth 4 (four) times daily as  needed.      Marland Kitchen escitalopram (LEXAPRO) 10 MG tablet Take 2 tablets (20 mg total) by mouth daily.  30 tablet  5  . Fluticasone-Salmeterol (ADVAIR DISKUS) 100-50 MCG/DOSE AEPB Inhale 1 puff into the lungs every 12 (twelve) hours.      Marland Kitchen HYDROcodone-acetaminophen (NORCO) 5-325 MG per tablet Take 1 tablet by mouth every 6 (six) hours as needed.      Marland Kitchen lisinopril-hydrochlorothiazide (PRINZIDE,ZESTORETIC) 20-25 MG per tablet Take 1 tablet by mouth daily.  30 tablet  6  . LORazepam (ATIVAN) 1 MG tablet Take one half to one at bedtime as needed.      Marland Kitchen losartan-hydrochlorothiazide (HYZAAR) 50-12.5 MG per tablet Take 1 tablet by mouth daily.  90 tablet  3  . methocarbamol (ROBAXIN) 500 MG tablet Take 500 mg by mouth 3 (three) times daily.      . Multiple Vitamin (MULTIVITAMIN) tablet Take 1 tablet by mouth daily.      Marland Kitchen omeprazole (PRILOSEC) 40 MG capsule Take 40 mg by mouth daily.      . sitaGLIPtan-metformin (JANUMET) 50-500 MG per tablet Take 1 tablet by  mouth 2 (two) times daily with a meal.  60 tablet  6  . VITAMIN D, ERGOCALCIFEROL, PO Take by mouth.      . zolpidem (AMBIEN) 10 MG tablet Take 10 mg by mouth at bedtime as needed.      Marland Kitchen DISCONTD: escitalopram (LEXAPRO) 10 MG tablet Take 1 tablet (10 mg total) by mouth daily.  30 tablet  5  . DISCONTD: losartan-hydrochlorothiazide (HYZAAR) 50-12.5 MG per tablet Take 1 tablet by mouth daily.  90 tablet  3

## 2011-09-01 NOTE — Assessment & Plan Note (Addendum)
Neurologic exam was normal in Mini-Mental Status was actually quite good. Figure her current erratic behavior is due to worsening depression and anxiety. She has been taking Lexapro 10 mg daily for treatment of anxiety. Her current level of inactivity and disengagement from husband suggests that she is not clinically depressed. Will increase Lexapro to 20 mg daily and have patient return in one month for followup.

## 2011-09-01 NOTE — Assessment & Plan Note (Signed)
Her nocturnal gasping and exertional dyspnea may be due to untreated sleep apnea versus congestive heart failure. She has no signs of congestive heart failure on exam but does have central abdominal obesity which may be causing restrictive lung disease with supine position. I have ordered an overnight pulse oximetry chest to assess her oxygenation at night. If this is normal we'll consider stress test or echocardiogram to evaluate systolic function.

## 2011-09-01 NOTE — Patient Instructions (Signed)
I am increasing your Lexapro to 20 mg daily .  I am recommending that you walk for  20 minutes three times a week to start   I do not want you to sleep during the day except for a 20 minute nap  I would like you to read  to Sheena [name] when she feels sleepy or get up and walk   I want you to take a Driver's Test  To test your skills

## 2011-09-01 NOTE — Assessment & Plan Note (Addendum)
Her current complaints of insomnia are due to an altered sleep cycle. She is retreating to her bedroom during the day to avoid spending time with her husband who is dying on hospice. As a consequence she is unable to sleep at night because she sleeps all day long. I cannot prescribe any more medications for insomnia when the problem is patient's reluctance to engage in quality time with her husband. We spent 20 minutes discussing the fact that she needs to be more actively involved in this part of his life before tonight. I suggested that she collect his favorite readings and read to him during the day. This will not only provide her an activity that will keep her awake but will also assuage any guilt or that she may develop in the future over not spending time with him. She is also admonished and advised to walk for 20 minutes daily with or without her family whenever she can whenever she feels sleepy. I suggested that she take no more than a 20 minute during the day. A total of 60 minutes was spent with this patient today in evaluation and counselling.

## 2011-09-02 ENCOUNTER — Encounter: Payer: Self-pay | Admitting: Internal Medicine

## 2011-09-02 ENCOUNTER — Telehealth: Payer: Self-pay | Admitting: Internal Medicine

## 2011-09-02 DIAGNOSIS — J45909 Unspecified asthma, uncomplicated: Secondary | ICD-10-CM | POA: Insufficient documentation

## 2011-09-02 DIAGNOSIS — C439 Malignant melanoma of skin, unspecified: Secondary | ICD-10-CM | POA: Insufficient documentation

## 2011-09-02 NOTE — Telephone Encounter (Signed)
[phone] Sheena December Sheena [name] daughter called. Dr Darrick Huntsman gave verbal test for deimitia that she passed. daughter was not surprised she passed.  Is the behavior that Sheena [name] a type of deimita treatable.  Her symptoms are  Ex. Going to store for milk and cheese she comes back with 7 ice creams onions 6 olive jars 4 peanut butter tuff she bought the day before. obsessive buying Forgetting where she is going.  And where to turn when she driving somewhere, [employer], forget time and where she's  Been.  Is this a type of demitia or her behavior.  What is the next step for Sheena [name] with her mom.

## 2011-09-02 NOTE — Assessment & Plan Note (Signed)
Her diabetes has become less controlled due to her inacitivity and eating habits.  hgba1c is now 7.3.  NO medication changes today but have recommended that she stop eating candy and ice cream every night.

## 2011-09-02 NOTE — Assessment & Plan Note (Signed)
She has gained considerable weight since her husband's decline in his is resulted in hospice care. She is not walking daily and is requiring increasing assistance at home due to inactivity. I have recommended that she take a 15 minute walk daily with her daughter and caregiver and have recommended that daughter remove the ice cream and candy from the house and force her to  Adopt a healthy eating style.

## 2011-09-02 NOTE — Telephone Encounter (Signed)
Advised patient's daughter.

## 2011-09-02 NOTE — Telephone Encounter (Signed)
I do not think she has dementia.  I think her problem is depression and her personality.  The next step is to increase her lexapro, as we discussed,  And if she wants more indepth evaluation for behavioral issues we can refer her to a psychiatrist, ,  Dr. Jeanie Sewer at [facility] is excellent

## 2011-09-03 ENCOUNTER — Telehealth: Payer: Self-pay | Admitting: *Deleted

## 2011-09-03 NOTE — Telephone Encounter (Signed)
Advised pharmacist. 

## 2011-09-03 NOTE — Telephone Encounter (Signed)
Disregard the losartan and continue the lisinopril,  The family gave [place] the wrong information and the old chart was not availabel for verification.  From now on we call the pharmacy if there is a question about a medication before  filling it.

## 2011-09-03 NOTE — Telephone Encounter (Signed)
Pharmacist states that pt was given a script for losartan /hctz 50/12.5 but she has previously been on lisinopril 20/12.5 and he just wants to make sure you are changing her meds and why.

## 2011-09-03 NOTE — Telephone Encounter (Signed)
Letter regarding pt's driving mailed to her daughter.

## 2011-09-08 ENCOUNTER — Telehealth: Payer: Self-pay | Admitting: *Deleted

## 2011-09-08 NOTE — Telephone Encounter (Signed)
Pt's daughter wanted you to know that pt needs, and will be getting, bilateral hearing aids.

## 2011-09-14 ENCOUNTER — Encounter: Payer: Self-pay | Admitting: Internal Medicine

## 2011-09-15 ENCOUNTER — Encounter: Payer: Self-pay | Admitting: Internal Medicine

## 2011-09-29 ENCOUNTER — Telehealth: Payer: Self-pay | Admitting: Internal Medicine

## 2011-09-29 NOTE — Telephone Encounter (Signed)
Office Message [address] [place], [place] [address] p. [phone] f. [phone] To: [facility] (Daytime Triage) Fax: [phone] From: [facility] Date/ Time: 09/29/2011 4:35 PM Taken By: Alphonzo Lemmings, CSR Caller: [name] Facility: not collected Patient: Areya, Lemmerman DOB: October 14, 1923 Phone: [phone] Reason for Call: [name] calling to check on letter Dr Darrick Huntsman wrote to [facility] regarding patient having to take drivers test for license and wants to know if they were suppose to hear from the [facility]. Regarding Appointment: Appt Date: Appt Time: Unknown Provider: Reason: Details: Outcome:

## 2011-10-01 NOTE — Telephone Encounter (Signed)
Advised pt's daughter to call DM[employer] to follow up.

## 2011-10-14 ENCOUNTER — Other Ambulatory Visit: Payer: Self-pay | Admitting: Internal Medicine

## 2011-10-14 MED ORDER — FISH OIL 1000 MG PO CAPS: 1000.0000 mg | ORAL_CAPSULE | Freq: Every day | ORAL | Status: DC

## 2011-10-16 ENCOUNTER — Other Ambulatory Visit: Payer: Self-pay | Admitting: *Deleted

## 2011-10-16 MED ORDER — BENZONATATE 200 MG PO CAPS: 200.0000 mg | ORAL_CAPSULE | Freq: Three times a day (TID) | ORAL | Status: AC | PRN

## 2011-10-16 NOTE — Telephone Encounter (Signed)
Refill request for Benzonatate 200 mg received by fax.

## 2011-10-29 ENCOUNTER — Other Ambulatory Visit: Payer: Self-pay | Admitting: Internal Medicine

## 2011-10-29 MED ORDER — ASPIRIN 81 MG PO TABS: 81.0000 mg | ORAL_TABLET | Freq: Every day | ORAL | Status: DC

## 2011-10-29 MED ORDER — OCUVITE PO TABS: 1.0000 | ORAL_TABLET | Freq: Every day | ORAL | Status: DC

## 2011-10-29 MED ORDER — LORATADINE 10 MG PO TABS: 10.0000 mg | ORAL_TABLET | Freq: Every day | ORAL | Status: DC

## 2011-10-29 MED ORDER — ATORVASTATIN CALCIUM 40 MG PO TABS: 40.0000 mg | ORAL_TABLET | Freq: Every day | ORAL | Status: DC

## 2011-11-12 ENCOUNTER — Telehealth: Payer: Self-pay | Admitting: Internal Medicine

## 2011-11-12 NOTE — Telephone Encounter (Signed)
Letter has been faxed.

## 2011-11-12 NOTE — Telephone Encounter (Signed)
Wanting a letter that we have on file faxed to [facility] [phone] for her mother's drivers license .

## 2011-11-13 DIAGNOSIS — Z0279 Encounter for issue of other medical certificate: Secondary | ICD-10-CM

## 2011-12-29 ENCOUNTER — Telehealth: Payer: Self-pay | Admitting: Internal Medicine

## 2011-12-29 NOTE — Telephone Encounter (Signed)
Use of soma in the elderly is not  advised. It can cuase delirium and hallucinations

## 2011-12-29 NOTE — Telephone Encounter (Signed)
Patient's daughter Sheena [name] wants mother to have a prescription for Soma 450 mg to help her sleep.

## 2011-12-30 NOTE — Telephone Encounter (Signed)
She is already prescribed ambien and lorazepam, both of which are active on her chart.  f she is sleeping during the day, then she will not sleep at night. I cannot prescribe anything else safely without seeing patient.

## 2011-12-30 NOTE — Telephone Encounter (Signed)
[name] notified, she stated patient stays awake all night until 4 or 5 am, she wanted to know if there was anything you could prescribe that she could take if she has not fallen asleep by 5 am.  Please advise.

## 2011-12-31 NOTE — Telephone Encounter (Signed)
Patients daughter notified, she made an appt.

## 2012-01-08 ENCOUNTER — Other Ambulatory Visit: Payer: Self-pay | Admitting: *Deleted

## 2012-01-08 MED ORDER — OMEPRAZOLE 40 MG PO CPDR: 40.0000 mg | DELAYED_RELEASE_CAPSULE | Freq: Every day | ORAL | Status: DC

## 2012-01-12 ENCOUNTER — Ambulatory Visit: Payer: 59 | Admitting: Internal Medicine

## 2012-01-12 DIAGNOSIS — Z0289 Encounter for other administrative examinations: Secondary | ICD-10-CM

## 2012-02-08 ENCOUNTER — Other Ambulatory Visit: Payer: Self-pay | Admitting: Internal Medicine

## 2012-02-08 MED ORDER — OMEPRAZOLE 40 MG PO CPDR: 40.0000 mg | DELAYED_RELEASE_CAPSULE | Freq: Every day | ORAL | Status: DC

## 2012-02-22 ENCOUNTER — Other Ambulatory Visit: Payer: Self-pay | Admitting: *Deleted

## 2012-02-23 MED ORDER — ZOLPIDEM TARTRATE 5 MG PO TABS: 5.0000 mg | ORAL_TABLET | Freq: Every evening | ORAL | Status: DC | PRN

## 2012-02-24 NOTE — Telephone Encounter (Signed)
rx phone in

## 2012-03-15 ENCOUNTER — Encounter: Payer: Self-pay | Admitting: Internal Medicine

## 2012-03-15 ENCOUNTER — Ambulatory Visit (INDEPENDENT_AMBULATORY_CARE_PROVIDER_SITE_OTHER): Payer: 59 | Admitting: Internal Medicine

## 2012-03-15 VITALS — BP 116/60 | HR 82 | Temp 98.2°F | Resp 18 | Wt 245.5 lb

## 2012-03-15 DIAGNOSIS — F32A Depression, unspecified: Secondary | ICD-10-CM

## 2012-03-15 DIAGNOSIS — N289 Disorder of kidney and ureter, unspecified: Secondary | ICD-10-CM

## 2012-03-15 DIAGNOSIS — Z634 Disappearance and death of family member: Secondary | ICD-10-CM

## 2012-03-15 DIAGNOSIS — E119 Type 2 diabetes mellitus without complications: Secondary | ICD-10-CM

## 2012-03-15 DIAGNOSIS — E039 Hypothyroidism, unspecified: Secondary | ICD-10-CM

## 2012-03-15 DIAGNOSIS — G4700 Insomnia, unspecified: Secondary | ICD-10-CM

## 2012-03-15 DIAGNOSIS — E1169 Type 2 diabetes mellitus with other specified complication: Secondary | ICD-10-CM

## 2012-03-15 DIAGNOSIS — F329 Major depressive disorder, single episode, unspecified: Secondary | ICD-10-CM

## 2012-03-15 DIAGNOSIS — E669 Obesity, unspecified: Secondary | ICD-10-CM

## 2012-03-15 LAB — COMPREHENSIVE METABOLIC PANEL
AST: 21 U/L (ref 0–37)
Albumin: 3.7 g/dL (ref 3.5–5.2)
BUN: 33 mg/dL — ABNORMAL HIGH (ref 6–23)
Calcium: 8.7 mg/dL (ref 8.4–10.5)
Chloride: 99 mEq/L (ref 96–112)
Glucose, Bld: 217 mg/dL — ABNORMAL HIGH (ref 70–99)
Potassium: 4.2 mEq/L (ref 3.5–5.1)
Sodium: 133 mEq/L — ABNORMAL LOW (ref 135–145)
Total Protein: 7.1 g/dL (ref 6.0–8.3)

## 2012-03-15 LAB — TSH: TSH: 1.64 u[IU]/mL (ref 0.35–5.50)

## 2012-03-15 LAB — HEMOGLOBIN A1C: Hgb A1c MFr Bld: 7.2 % — ABNORMAL HIGH (ref 4.6–6.5)

## 2012-03-15 MED ORDER — HYDROCODONE-ACETAMINOPHEN 5-325 MG PO TABS: 1.0000 | ORAL_TABLET | Freq: Four times a day (QID) | ORAL | Status: DC | PRN

## 2012-03-15 MED ORDER — QUETIAPINE FUMARATE 50 MG PO TABS: 50.0000 mg | ORAL_TABLET | Freq: Every day | ORAL | Status: DC

## 2012-03-15 MED ORDER — PAROXETINE HCL 30 MG PO TABS
30.0000 mg | ORAL_TABLET | ORAL | Status: DC
Start: 2012-03-15 — End: 2012-07-11

## 2012-03-15 NOTE — Patient Instructions (Addendum)
Please reduce the Soma to 2 times daily.    I am going to prescribe Seroquel  To take 1 hour before bedtime.  If she is still wide awake in one hour, may add the [place].   We can increase the seroquel in one week if we need to    I am going to change the lexapro to paxil 30 mg daily ,  because it will treat the depression and anxiety . We can wait until her next refill on the lexapro is due
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
## 2012-03-15 NOTE — Assessment & Plan Note (Signed)
Unresponsive to Ambien 5 mg. Patient took 10 mg with no results either. We are starting Seroquel today, 50 mg at bedtime.

## 2012-03-15 NOTE — Assessment & Plan Note (Signed)
L. will give her husband's recent passing. We discussed changing her Lexapro to Paxil in several weeks given failure of symptoms to be mitigated.

## 2012-03-16 ENCOUNTER — Ambulatory Visit: Payer: Self-pay | Admitting: Ophthalmology

## 2012-03-16 LAB — POTASSIUM: Potassium: 5.3 mmol/L — ABNORMAL HIGH (ref 3.5–5.1)

## 2012-03-17 NOTE — Addendum Note (Signed)
Addended by: Duncan Dull on: 03/17/2012 02:42 PM   Modules accepted: Orders

## 2012-03-28 ENCOUNTER — Encounter: Payer: Self-pay | Admitting: Internal Medicine

## 2012-03-30 ENCOUNTER — Ambulatory Visit: Payer: 59 | Admitting: Internal Medicine

## 2012-03-30 ENCOUNTER — Ambulatory Visit: Payer: Self-pay | Admitting: Ophthalmology

## 2012-03-31 ENCOUNTER — Other Ambulatory Visit: Payer: Self-pay | Admitting: *Deleted

## 2012-04-01 ENCOUNTER — Other Ambulatory Visit: Payer: Self-pay | Admitting: Internal Medicine

## 2012-04-01 MED ORDER — ZOLPIDEM TARTRATE 5 MG PO TABS: 5.0000 mg | ORAL_TABLET | Freq: Every evening | ORAL | Status: DC | PRN

## 2012-04-06 ENCOUNTER — Ambulatory Visit (INDEPENDENT_AMBULATORY_CARE_PROVIDER_SITE_OTHER): Payer: 59 | Admitting: Internal Medicine

## 2012-04-06 ENCOUNTER — Encounter: Payer: Self-pay | Admitting: Internal Medicine

## 2012-04-06 VITALS — BP 110/76 | HR 90 | Temp 98.4°F | Resp 18 | Wt 247.8 lb

## 2012-04-06 DIAGNOSIS — F329 Major depressive disorder, single episode, unspecified: Secondary | ICD-10-CM

## 2012-04-06 DIAGNOSIS — E1169 Type 2 diabetes mellitus with other specified complication: Secondary | ICD-10-CM

## 2012-04-06 DIAGNOSIS — E669 Obesity, unspecified: Secondary | ICD-10-CM

## 2012-04-06 DIAGNOSIS — E119 Type 2 diabetes mellitus without complications: Secondary | ICD-10-CM

## 2012-04-06 NOTE — Progress Notes (Signed)
Patient ID: Sheena [name], female   DOB: 1924-03-02, 78 y.o.   MRN: [id number] Patient Active Problem List  Diagnosis  . C O P D  . SHORTNESS OF BREATH (SOB)  . COUGH  . Insomnia, persistent  . Depression  . Obesity (BMI 30-39.9)  . Diabetes mellitus type 2 in obese  . Melanoma  . Asthma  . Bereavement due to life event    Subjective:  CC:   Chief Complaint  Patient presents with  . Follow-up    HPI:   Sheena [name] a 78 y.o. female who presents for follow up on insomnia , chronic complicated by grief reaction to recent loss of husband.  She is sleeping better since changing to seroquel and paxil. She is not using ambien daily any more .  The nocturnal agitation has resolved and she denies excessive sedation in the morning.    Past Medical History  Diagnosis Date  . H/O: hysterectomy 1973    fibroids  . Lumbago   . Left arm pain   . Diabetes mellitus, type 2   . Melanoma     left arm, resected, annual CXR's ordered  . Asthma     treated by Dr.  Callas    Past Surgical History  Procedure Date  . Total hip arthroplasty 2003    left, Dr. Cleophas Dunker  . Breast biopsy 1985    normal  . Joint replacement  2003    Left Hip  . Abdominal hysterectomy 1973    fibroid uterus    The following portions of the patient's history were reviewed and updated as appropriate: Allergies, current medications, and problem list.    Review of Systems:   A comprehensive ROS was done and positive for increased appetite.   The rest was negative.     History   Social History  . Marital Status: Married    Spouse Name: N/A    Number of Children: N/A  . Years of Education: N/A   Occupational History  . Not on file.   Social History Main Topics  . Smoking status: Never Smoker   . Smokeless tobacco: Never Used  . Alcohol Use: Yes     occasional  . Drug Use: No  . Sexually Active: Not on file   Other Topics Concern  . Not on file   Social History Narrative   Lives with husband in one level house.    Objective:  BP 110/76  Pulse 90  Temp 98.4 F (36.9 C) (Oral)  Resp 18  Wt 247 lb 12 oz (112.379 kg)  SpO2 95%  General appearance: alert, cooperative and appears stated age Ears: normal TM's and external ear canals both ears Throat: lips, mucosa, and tongue normal; teeth and gums normal Neck: no adenopathy, no carotid bruit, supple, symmetrical, trachea midline and thyroid not enlarged, symmetric, no tenderness/mass/nodules Back: symmetric, no curvature. ROM normal. No CVA tenderness. Lungs: clear to auscultation bilaterally Heart: regular rate and rhythm, S1, S2 normal, no murmur, click, rub or gallop Abdomen: soft, non-tender; bowel sounds normal; no masses,  no organomegaly Pulses: 2+ and symmetric Skin: Skin color, texture, turgor normal. No rashes or lesions Lymph nodes: Cervical, supraclavicular, and axillary nodes normal.  Assessment and Plan:  Depression Aggravated by loss of husband. With insomnia , nocturnal agitation and overeating as primary symptoms.  The insomnia hs improved with addition of Seroquel and Paxill,   No changes today   Obesity (BMI 30-39.9) I have addressed  BMI and  recommended a low glycemic index diet utilizing smaller more frequent meals to increase metabolism.  I have also recommended that patient start exercising with a goal of 30 minutes of aerobic exercise a minimum of 5 days per week. Screening for lipid disorders, thyroid and diabetes to be done today.    Diabetes mellitus type 2 in obese hgba1c was 7.2 earlier in the month.  No changes today except strong advice to start exercising and following a low GI diet   Updated Medication List Outpatient Encounter Prescriptions as of 04/06/2012  Medication Sig Dispense Refill  . aspirin 81 MG tablet Take 1 tablet (81 mg total) by mouth daily.  30 tablet  5  . atorvastatin (LIPITOR) 40 MG tablet Take 1 tablet (40 mg total) by mouth daily.  30 tablet  3    . Azelastine HCl (ASTEPRO) 0.15 % SOLN One inhalation 2 times daily in each nostril      . beta carotene w/minerals (OCUVITE) tablet Take 1 tablet by mouth daily.  30 tablet  5  . Calcium Carbonate (CALCIUM 500 PO) Take by mouth.      . carisoprodol (SOMA) 350 MG tablet Take 350 mg by mouth 4 (four) times daily as needed.      . Fluticasone-Salmeterol (ADVAIR DISKUS) 100-50 MCG/DOSE AEPB Inhale 1 puff into the lungs every 12 (twelve) hours.      Marland Kitchen HYDROcodone-acetaminophen (NORCO/VICODIN) 5-325 MG per tablet Take 1 tablet by mouth every 6 (six) hours as needed for pain.  60 tablet  2  . lisinopril-hydrochlorothiazide (PRINZIDE,ZESTORETIC) 20-25 MG per tablet Take 1 tablet by mouth daily.  30 tablet  6  . LORazepam (ATIVAN) 1 MG tablet Take one half to one at bedtime as needed.      Marland Kitchen losartan-hydrochlorothiazide (HYZAAR) 50-12.5 MG per tablet Take 1 tablet by mouth daily.  90 tablet  3  . Multiple Vitamin (MULTIVITAMIN) tablet Take 1 tablet by mouth daily.      . Omega-3 Fatty Acids (FISH OIL) 1000 MG CAPS Take 1 capsule (1,000 mg total) by mouth daily.  30 capsule  5  . omeprazole (PRILOSEC) 40 MG capsule Take 1 capsule (40 mg total) by mouth daily.  30 capsule  4  . PARoxetine (PAXIL) 30 MG tablet Take 1 tablet (30 mg total) by mouth every morning.  30 tablet  1  . QUEtiapine (SEROQUEL) 50 MG tablet Take 1 tablet (50 mg total) by mouth at bedtime.  30 tablet  1  . sitaGLIPtan-metformin (JANUMET) 50-500 MG per tablet Take 1 tablet by mouth 2 (two) times daily with a meal.  60 tablet  6  . VITAMIN D, ERGOCALCIFEROL, PO Take by mouth.      . zolpidem (AMBIEN) 5 MG tablet Take 1 tablet (5 mg total) by mouth at bedtime as needed.  30 tablet  3     No orders of the defined types were placed in this encounter.    Return in about 1 month (around 05/07/2012).

## 2012-04-06 NOTE — Patient Instructions (Addendum)
[facility] want [facility] to join [facility] , you and [name]  I want you to spend 3 days a week in the pool either walking or taking part in the water aerobics classes.   The other two day  Walk for a total of 15 minutes on your treadmill  (eventually all at one time instead of broken up)  You can have 2 [employer] mini bars daily  (only if you exercise)    Stick with the sugar free ice cream and yogurt

## 2012-04-11 NOTE — Assessment & Plan Note (Signed)
hgba1c was 7.2 earlier in the month.  No changes today except strong advice to start exercising and following a low GI diet

## 2012-04-11 NOTE — Assessment & Plan Note (Signed)
I have addressed  BMI and recommended a low glycemic index diet utilizing smaller more frequent meals to increase metabolism.  I have also recommended that patient start exercising with a goal of 30 minutes of aerobic exercise a minimum of 5 days per week. Screening for lipid disorders, thyroid and diabetes to be done today.   

## 2012-04-11 NOTE — Assessment & Plan Note (Signed)
Aggravated by loss of husband. With insomnia , nocturnal agitation and overeating as primary symptoms.  The insomnia hs improved with addition of Seroquel and Paxill,   No changes today

## 2012-05-09 ENCOUNTER — Other Ambulatory Visit: Payer: Self-pay | Admitting: Internal Medicine

## 2012-05-13 ENCOUNTER — Other Ambulatory Visit: Payer: Self-pay | Admitting: Internal Medicine

## 2012-05-17 ENCOUNTER — Ambulatory Visit: Payer: 59 | Admitting: Internal Medicine

## 2012-05-19 ENCOUNTER — Ambulatory Visit: Payer: 59 | Admitting: Internal Medicine

## 2012-05-19 DIAGNOSIS — Z0289 Encounter for other administrative examinations: Secondary | ICD-10-CM

## 2012-06-04 ENCOUNTER — Other Ambulatory Visit: Payer: Self-pay | Admitting: Internal Medicine

## 2012-06-06 ENCOUNTER — Encounter: Payer: Self-pay | Admitting: Internal Medicine

## 2012-06-06 ENCOUNTER — Ambulatory Visit (INDEPENDENT_AMBULATORY_CARE_PROVIDER_SITE_OTHER): Payer: 59 | Admitting: Internal Medicine

## 2012-06-06 VITALS — BP 110/60 | HR 96 | Temp 97.7°F | Resp 12 | Ht 63.0 in | Wt 248.2 lb

## 2012-06-06 DIAGNOSIS — R2689 Other abnormalities of gait and mobility: Secondary | ICD-10-CM | POA: Insufficient documentation

## 2012-06-06 DIAGNOSIS — E669 Obesity, unspecified: Secondary | ICD-10-CM

## 2012-06-06 DIAGNOSIS — F323 Major depressive disorder, single episode, severe with psychotic features: Secondary | ICD-10-CM

## 2012-06-06 DIAGNOSIS — E1169 Type 2 diabetes mellitus with other specified complication: Secondary | ICD-10-CM

## 2012-06-06 DIAGNOSIS — E119 Type 2 diabetes mellitus without complications: Secondary | ICD-10-CM

## 2012-06-06 DIAGNOSIS — R29818 Other symptoms and signs involving the nervous system: Secondary | ICD-10-CM

## 2012-06-06 MED ORDER — ESCITALOPRAM OXALATE 20 MG PO TABS: 20.0000 mg | ORAL_TABLET | Freq: Every day | ORAL | Status: DC

## 2012-06-06 NOTE — Assessment & Plan Note (Signed)
Cerebellar function intact.  Neuro exam normal.  Falls occurring secondary to  Vs sedation.  Home PT evaluation ordered.

## 2012-06-06 NOTE — Patient Instructions (Addendum)
Continue the lexapro and stop the paxil.  You can give her the lexapro at night or in the morning.   We will ask a physical therapist to come to the house for an assessment on your balance.

## 2012-06-06 NOTE — Assessment & Plan Note (Signed)
Well controlled for age , no changes today. Retrun for labs in November

## 2012-06-06 NOTE — Assessment & Plan Note (Signed)
Her hallucinations have resolved with use of Seroquel but she is sleeping most of the day.  Stopping Paxil,  Continue Lexapro.  meds reviewed  Lorazepam and soma have not been used in weeks
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
## 2012-06-08 ENCOUNTER — Other Ambulatory Visit: Payer: Self-pay | Admitting: Internal Medicine

## 2012-06-08 DIAGNOSIS — I10 Essential (primary) hypertension: Secondary | ICD-10-CM

## 2012-06-08 MED ORDER — LOSARTAN POTASSIUM-HCTZ 50-12.5 MG PO TABS: 1.0000 | ORAL_TABLET | Freq: Every day | ORAL | Status: DC

## 2012-06-08 NOTE — Telephone Encounter (Signed)
Refills authorized.

## 2012-06-09 ENCOUNTER — Other Ambulatory Visit: Payer: Self-pay

## 2012-07-04 ENCOUNTER — Other Ambulatory Visit: Payer: Self-pay

## 2012-07-04 ENCOUNTER — Ambulatory Visit: Payer: 59 | Admitting: Internal Medicine

## 2012-07-04 NOTE — Telephone Encounter (Signed)
Refill request for Seroquel 50 mg. Ok to refill?

## 2012-07-05 ENCOUNTER — Other Ambulatory Visit: Payer: Self-pay

## 2012-07-05 MED ORDER — QUETIAPINE FUMARATE 50 MG PO TABS: 50.0000 mg | ORAL_TABLET | Freq: Every day | ORAL | Status: DC

## 2012-07-05 NOTE — Telephone Encounter (Signed)
Refill request for Seroquel 50 mg. Ok to refill?Send to [facility]

## 2012-07-11 ENCOUNTER — Ambulatory Visit (INDEPENDENT_AMBULATORY_CARE_PROVIDER_SITE_OTHER): Payer: 59 | Admitting: Internal Medicine

## 2012-07-11 VITALS — BP 118/60 | HR 86 | Temp 97.2°F | Ht 66.0 in | Wt 234.5 lb

## 2012-07-11 DIAGNOSIS — F323 Major depressive disorder, single episode, severe with psychotic features: Secondary | ICD-10-CM

## 2012-07-11 DIAGNOSIS — E119 Type 2 diabetes mellitus without complications: Secondary | ICD-10-CM

## 2012-07-11 DIAGNOSIS — E1169 Type 2 diabetes mellitus with other specified complication: Secondary | ICD-10-CM

## 2012-07-11 DIAGNOSIS — R2689 Other abnormalities of gait and mobility: Secondary | ICD-10-CM

## 2012-07-11 DIAGNOSIS — E669 Obesity, unspecified: Secondary | ICD-10-CM

## 2012-07-11 DIAGNOSIS — N289 Disorder of kidney and ureter, unspecified: Secondary | ICD-10-CM

## 2012-07-11 DIAGNOSIS — R29818 Other symptoms and signs involving the nervous system: Secondary | ICD-10-CM

## 2012-07-11 LAB — HM DIABETES FOOT EXAM: HM Diabetic Foot Exam: NORMAL

## 2012-07-11 MED ORDER — FLUTICASONE-SALMETEROL 100-50 MCG/DOSE IN AEPB: 1.0000 | INHALATION_SPRAY | Freq: Two times a day (BID) | RESPIRATORY_TRACT | Status: DC

## 2012-07-11 NOTE — Progress Notes (Signed)
Patient ID: Sheena [name], female   DOB: 1923-11-19, 78 y.o.   MRN: [id number]   Patient Active Problem List  Diagnosis  . C O P D  . Insomnia, persistent  . Severe major depression with psychotic features  . Obesity (BMI 30-39.9)  . Diabetes mellitus type 2 in obese  . Melanoma  . Asthma  . Loss of balance    Subjective:  CC:   Chief Complaint  Patient presents with  . Medication Problem    HPI:   Sheena [name] a 78 y.o. female who presents for follow up on chronic conditions including diabetes mellitus,  Insomnia secondary to depression with complicated grief reaction, dementia , obesity and deconditioning . She is accompanied by [name], her caregiver who reports that her balance is improving with PT twice weekly, and she is no longer staggering. No recent falls.  However she has developed itching of her forearms that causes her to scratch until she bleeds. There is no history of new medications.  No rash noted.  Uses a scented moisturizer from [place].Marland Kitchen 2) Diabetes and obesity.  Has lost weight,  Eating less ice cream and candy.  4) Daughter [name] is concerned about patient sleeping too much.  Review of medications indicated that she is taking seroquel, started after her husband died for extreme agitation and nocturnal hallucinations, ambien and lexapro.    Past Medical History  Diagnosis Date  . H/O: hysterectomy 1973    fibroids  . Lumbago   . Left arm pain   . Diabetes mellitus, type 2   . Melanoma     left arm, resected, annual CXR's ordered  . Asthma     treated by Dr. [place] Callas  . Depression     Past Surgical History  Procedure Date  . Total hip arthroplasty 2003    left, Dr. Cleophas Dunker  . Breast biopsy 1985    normal  . Joint replacement  2003    Left Hip  . Abdominal hysterectomy 1973    fibroid uterus         The following portions of the patient's history were reviewed and updated as appropriate: Allergies, current medications, and problem  list.    Review of Systems:   Patient denies headache, fevers, malaise, unintentional weight loss, skin rash, eye pain, sinus congestion and sinus pain, sore throat, dysphagia,  hemoptysis , cough, dyspnea, wheezing, chest pain, palpitations, orthopnea, edema, abdominal pain, nausea, melena, diarrhea, constipation, flank pain, dysuria, hematuria, urinary  Frequency, nocturia, numbness, tingling, seizures,  Focal weakness, Loss of consciousness,  Tremor, insomnia, depression, anxiety, and suicidal ideation.       History   Social History  . Marital Status: Married    Spouse Name: N/A    Number of Children: N/A  . Years of Education: N/A   Occupational History  . Not on file.   Social History Main Topics  . Smoking status: Never Smoker   . Smokeless tobacco: Never Used  . Alcohol Use: Yes     Comment: occasional  . Drug Use: No  . Sexually Active: Not on file   Other Topics Concern  . Not on file   Social History Narrative   Lives with husband in one level house.    Objective:  BP 118/60  Pulse 86  Temp 97.2 F (36.2 C) (Oral)  Ht 5\' 6"  (1.676 m)  Wt 234 lb 8 oz (106.369 kg)  BMI 37.85 kg/m2  SpO2 95%  General appearance: alert, cooperative  and appears stated age Ears: normal TM's and external ear canals both ears Throat: lips, mucosa, and tongue normal; teeth and gums normal Neck: no adenopathy, no carotid bruit, supple, symmetrical, trachea midline and thyroid not enlarged, symmetric, no tenderness/mass/nodules Back: symmetric, no curvature. ROM normal. No CVA tenderness. Lungs: clear to auscultation bilaterally Heart: regular rate and rhythm, S1, S2 normal, no murmur, click, rub or gallop Abdomen: soft, non-tender; bowel sounds normal; no masses,  no organomegaly Pulses: 2+ and symmetric Skin: Skin color, texture, turgor normal. No rashes or lesions Lymph nodes: Cervical, supraclavicular, and axillary nodes normal. Foot exam:  Nails are well trimmed,  No  callouses,  Sensation intact to microfilament  Assessment and Plan:  Diabetes mellitus type 2 in obese She has lost weight and her diabetes control is stable with dietary changes.  Given her age and dementia, no attempt at tighter control will be made.  hgba1c is 7.4 cr is stable,  On ARB,  Asa, and statin for hyperlipidemia..  Foot exam normal. Up to date on eye exam.   Severe major depression with psychotic features Stopping seroquel after brief titration due to excessive somnolence. . continue lexapro  Obesity (BMI 30-39.9) Improving with ditary changes and increased acti vity.  Loss of balance Secondary to deconditioning.  Improving with home PT,  No falls since early October.  Gait was improved today    Updated Medication List Outpatient Encounter Prescriptions as of 07/11/2012  Medication Sig Dispense Refill  . aspirin 81 MG tablet Take 1 tablet (81 mg total) by mouth daily.  30 tablet  5  . Azelastine HCl (ASTEPRO) 0.15 % SOLN One inhalation 2 times daily in each nostril      . beta carotene w/minerals (OCUVITE) tablet TAKE ONE TABLET BY MOUTH EVERY DAY.  30 tablet  11  . Calcium Carbonate (CALCIUM 500 PO) Take by mouth.      Marland Kitchen CLARITIN 10 MG tablet TAKE ONE TABLET BY MOUTH EVERY DAY.  30 tablet  11  . Fluticasone-Salmeterol (ADVAIR DISKUS) 100-50 MCG/DOSE AEPB Inhale 1 puff into the lungs every 12 (twelve) hours.  60 each  11  . HYDROcodone-acetaminophen (NORCO/VICODIN) 5-325 MG per tablet Take 1 tablet by mouth every 6 (six) hours as needed for pain.  60 tablet  2  . JANUMET 50-500 MG per tablet TAKE ONE TABLET BY MOUTH 2 TIMES A DAY WITH A MEAL  60 tablet  5  . LEXAPRO 20 MG tablet TAKE (1) TABLET BY MOUTH DAILY.  30 tablet  5  . LIPITOR 40 MG tablet TAKE ONE TABLET BY MOUTH EVERY DAY.  30 tablet  5  . lisinopril-hydrochlorothiazide (PRINZIDE,ZESTORETIC) 20-25 MG per tablet Take 1 tablet by mouth daily.  30 tablet  6  . losartan-hydrochlorothiazide (HYZAAR) 50-12.5 MG per  tablet Take 1 tablet by mouth daily.  90 tablet  3  . losartan-hydrochlorothiazide (HYZAAR) 50-12.5 MG per tablet Take 1 tablet by mouth daily.  90 tablet  3  . Multiple Vitamin (MULTIVITAMIN) tablet Take 1 tablet by mouth daily.      . Omega-3 Fatty Acids (FISH OIL) 1000 MG CAPS Take 1 capsule (1,000 mg total) by mouth daily.  30 capsule  5  . PRILOSEC 40 MG capsule TAKE 1 CAPSULE BY MOUTH DAILY.  30 capsule  5  . QC LO-DOSE ASPIRIN 81 MG EC tablet TAKE ONE TABLET BY MOUTH EVERY DAY.  30 tablet  11  . zolpidem (AMBIEN) 5 MG tablet Take 1 tablet (5 mg  total) by mouth at bedtime as needed.  30 tablet  3  . [DISCONTINUED] Fluticasone-Salmeterol (ADVAIR DISKUS) 100-50 MCG/DOSE AEPB Inhale 1 puff into the lungs every 12 (twelve) hours.      . [DISCONTINUED] PARoxetine (PAXIL) 30 MG tablet Take 1 tablet (30 mg total) by mouth every morning.  30 tablet  1  . [DISCONTINUED] QUEtiapine (SEROQUEL) 50 MG tablet Take 1 tablet (50 mg total) by mouth at bedtime.  30 tablet  3  . [DISCONTINUED] VITAMIN D, ERGOCALCIFEROL, PO Take by mouth.      . ergocalciferol (DRISDOL) 50000 UNITS capsule Take 1 capsule (50,000 Units total) by mouth once a week.  4 capsule  2     Orders Placed This Encounter  Procedures  . Microalbumin / creatinine urine ratio  . Comprehensive metabolic panel  . Hemoglobin A1c  . LDL cholesterol, direct  . HM DIABETES EYE EXAM    No Follow-up on file.

## 2012-07-11 NOTE — Patient Instructions (Addendum)
Decrease the seroquel to 25 mg nightly for 2 weeks  Then stop completely.  continue the zolpidem Remus Loffler) as needed   If we need something to help rest,  We can try mirtazipine, but call to let me know.   Please use eucerin skin moisturizer only ,  After every shower.    Avoid scented moisturizers completely.    Congratulations!  You lost 14 lbs!!  Keep up the good work

## 2012-07-12 ENCOUNTER — Other Ambulatory Visit: Payer: Self-pay | Admitting: Internal Medicine

## 2012-07-12 LAB — CBC WITH DIFFERENTIAL/PLATELET
Eosinophils Relative: 3.4 % (ref 0.0–5.0)
HCT: 35.2 % — ABNORMAL LOW (ref 36.0–46.0)
Hemoglobin: 11.4 g/dL — ABNORMAL LOW (ref 12.0–15.0)
Lymphocytes Relative: 16.3 % (ref 12.0–46.0)
Lymphs Abs: 1.4 10*3/uL (ref 0.7–4.0)
Monocytes Relative: 7.6 % (ref 3.0–12.0)
Neutro Abs: 6 10*3/uL (ref 1.4–7.7)
RBC: 3.76 Mil/uL — ABNORMAL LOW (ref 3.87–5.11)
WBC: 8.4 10*3/uL (ref 4.5–10.5)

## 2012-07-12 LAB — LDL CHOLESTEROL, DIRECT: Direct LDL: 99.8 mg/dL

## 2012-07-12 LAB — COMPREHENSIVE METABOLIC PANEL
ALT: 15 U/L (ref 0–35)
AST: 17 U/L (ref 0–37)
Albumin: 3.8 g/dL (ref 3.5–5.2)
Calcium: 8.9 mg/dL (ref 8.4–10.5)
Chloride: 101 mEq/L (ref 96–112)
Potassium: 4.7 mEq/L (ref 3.5–5.1)

## 2012-07-12 LAB — BASIC METABOLIC PANEL
Calcium: 8.9 mg/dL (ref 8.4–10.5)
GFR: 33.65 mL/min — ABNORMAL LOW (ref >60.00)
Potassium: 4.7 mEq/L (ref 3.5–5.1)
Sodium: 135 mEq/L (ref 135–145)

## 2012-07-12 LAB — MICROALBUMIN / CREATININE URINE RATIO
Creatinine,U: 182.2 mg/dL
Microalb Creat Ratio: 0.5 mg/g (ref 0.0–30.0)

## 2012-07-12 MED ORDER — ERGOCALCIFEROL 1.25 MG (50000 UT) PO CAPS: 50000.0000 [IU] | ORAL_CAPSULE | ORAL | Status: DC

## 2012-07-13 ENCOUNTER — Other Ambulatory Visit: Payer: Self-pay

## 2012-07-13 ENCOUNTER — Encounter: Payer: Self-pay | Admitting: Internal Medicine

## 2012-07-13 NOTE — Assessment & Plan Note (Signed)
Stopping seroquel after brief titration due to excessive somnolence. . continue lexapro

## 2012-07-13 NOTE — Assessment & Plan Note (Signed)
Secondary to deconditioning.  Improving with home PT,  No falls since early October.  Gait was improved today

## 2012-07-13 NOTE — Telephone Encounter (Signed)
Entered in error

## 2012-07-13 NOTE — Assessment & Plan Note (Signed)
Improving with ditary changes and increased acti vity.

## 2012-07-13 NOTE — Assessment & Plan Note (Addendum)
She has lost weight and her diabetes control is stable with dietary changes.  Given her age and dementia, no attempt at tighter control will be made.  hgba1c is 7.4 cr is stable,  On ARB,  Asa, and statin for hyperlipidemia..  Foot exam normal. Up to date on eye exam.

## 2012-07-26 ENCOUNTER — Other Ambulatory Visit: Payer: Self-pay | Admitting: Internal Medicine

## 2012-07-26 ENCOUNTER — Telehealth: Payer: Self-pay

## 2012-07-26 NOTE — Telephone Encounter (Signed)
She should stop the lisinopril Ethelda Chick

## 2012-07-26 NOTE — Telephone Encounter (Signed)
Home Health  Aide called stated that she notice patient is taking Lisinopril and Losartan, she wants clarification if patient should be taking back or needs to stop one. Please advise daughter Virgina Jock @ [phone].

## 2012-07-27 NOTE — Telephone Encounter (Signed)
Spoke to patient daughter advised her to stop taking lisinopril.

## 2012-08-09 ENCOUNTER — Telehealth: Payer: Self-pay | Admitting: Internal Medicine

## 2012-08-09 NOTE — Telephone Encounter (Signed)
07/25/12 at 4:47 daughter called in and stated that Patty the house keeper and caregiver for her mom walked out on them on Friday.  She wanted to know if we knew of anyone that needed a part time job to sit with her mother.

## 2012-08-09 NOTE — Telephone Encounter (Signed)
No I can't think of anyone at the moment.

## 2012-08-12 NOTE — Telephone Encounter (Signed)
Left message on home# voice mail that Dr. Darrick Huntsman has no information to offer on a sitter for her mom.Marland Kitchen

## 2012-08-27 ENCOUNTER — Other Ambulatory Visit: Payer: Self-pay | Admitting: Internal Medicine

## 2012-08-31 ENCOUNTER — Ambulatory Visit (INDEPENDENT_AMBULATORY_CARE_PROVIDER_SITE_OTHER): Payer: 59 | Admitting: Internal Medicine

## 2012-08-31 ENCOUNTER — Encounter: Payer: Self-pay | Admitting: Internal Medicine

## 2012-08-31 VITALS — BP 118/68 | HR 77 | Temp 97.9°F | Resp 16 | Wt 246.2 lb

## 2012-08-31 DIAGNOSIS — I10 Essential (primary) hypertension: Secondary | ICD-10-CM

## 2012-08-31 DIAGNOSIS — G4700 Insomnia, unspecified: Secondary | ICD-10-CM

## 2012-08-31 DIAGNOSIS — L981 Factitial dermatitis: Secondary | ICD-10-CM

## 2012-08-31 DIAGNOSIS — E669 Obesity, unspecified: Secondary | ICD-10-CM

## 2012-08-31 MED ORDER — ASPIRIN 81 MG PO TABS: 81.0000 mg | ORAL_TABLET | Freq: Every day | ORAL | Status: DC

## 2012-08-31 MED ORDER — QUETIAPINE FUMARATE 50 MG PO TABS: 50.0000 mg | ORAL_TABLET | Freq: Every day | ORAL | Status: DC

## 2012-08-31 MED ORDER — HYDROXYZINE HCL 25 MG PO TABS: 25.0000 mg | ORAL_TABLET | Freq: Three times a day (TID) | ORAL | Status: DC | PRN

## 2012-08-31 MED ORDER — ASPIRIN 81 MG PO TBEC: 81.0000 mg | DELAYED_RELEASE_TABLET | Freq: Every day | ORAL | Status: DC

## 2012-08-31 MED ORDER — OMEPRAZOLE 40 MG PO CPDR: 40.0000 mg | DELAYED_RELEASE_CAPSULE | Freq: Every morning | ORAL | Status: DC

## 2012-08-31 MED ORDER — LOSARTAN POTASSIUM-HCTZ 50-12.5 MG PO TABS: 1.0000 | ORAL_TABLET | Freq: Every day | ORAL | Status: DC

## 2012-08-31 MED ORDER — SITAGLIPTIN PHOS-METFORMIN HCL 50-500 MG PO TABS: 1.0000 | ORAL_TABLET | Freq: Two times a day (BID) | ORAL | Status: DC

## 2012-08-31 MED ORDER — FISH OIL 1000 MG PO CAPS: 1000.0000 mg | ORAL_CAPSULE | Freq: Every day | ORAL | Status: DC

## 2012-08-31 NOTE — Patient Instructions (Addendum)
I would like to moisturize your skin after showering,  With Eucerin skin cream  Use hydroxyzine up to 3 tmes daily for the itching .   Please have Takeisha up and dressed by 9 am an din bed by 10 PM   all morning  meds can be given with breakfast except the omeprazole, which should be given upon wakening

## 2012-08-31 NOTE — Progress Notes (Signed)
Patient ID: Sheena [name], female   DOB: 09-24-1923, 79 y.o.   MRN: [id number]  Patient Active Problem List  Diagnosis  . C O P D  . Insomnia, persistent  . Severe major depression with psychotic features  . Obesity (BMI 30-39.9)  . Diabetes mellitus type 2 in obese  . Melanoma  . Asthma  . Loss of balance  . Neurotic excoriations    Subjective:  CC:   Chief Complaint  Patient presents with  . Anxiety    HPI:   Sheena [name] a 79 y.o. female who presents New onset agitation evidenced by picking at the skin on her arms until she is bleeding.  Behavior has been present for several weeks.  She is brought in by new caretaker Sheena [name] who is present 3 24 hour sshifts per week.  Daughter Sheena [name] is still staying at night as well and gave mother one of her Hydroxyzine tablets which worked very well without excessive sedation.  New caretaker has replaced Sheena [name] who left abruptly.  New caretaker is concerned patient is not taking medications correctly and requests a review of appropriate times to give medications.  This was done today and required 15 minutes of discussion.  According to caregiver, patient has very  Labile sleep schedule, often sleeps through morning. But recently was observed to go a full 24 hours without sleep. No wandering or obsessive behavior. Seem to get short of breath with minimal walking.    Past Medical History  Diagnosis Date  . H/O: hysterectomy 1973    fibroids  . Lumbago   . Left arm pain   . Diabetes mellitus, type 2   . Melanoma     left arm, resected, annual CXR's ordered  . Asthma     treated by Dr. [place] Callas  . Depression     Past Surgical History  Procedure Date  . Total hip arthroplasty 2003    left, Dr. Cleoph[place] Dunker  . Breast biopsy 1985    normal  . Joint replacement  2003    Left Hip  . Abdominal hysterectomy 1973    fibroid uterus    The following portions of the patient's history were reviewed and updated as appropriate:  Allergies, current medications, and problem list.   Review of Systems:   Patient denies headache, fevers, malaise, unintentional weight loss, skin rash, eye pain, sinus congestion and sinus pain, sore throat, dysphagia,  hemoptysis , cough, dyspnea, wheezing, chest pain, palpitations, orthopnea, edema, abdominal pain, nausea, melena, diarrhea, constipation, flank pain, dysuria, hematuria, urinary  Frequency, nocturia, numbness, tingling, seizures,  Focal weakness, Loss of consciousness,  Tremor, insomnia, depression, anxiety, and suicidal ideation.     History   Social History  . Marital Status: Married    Spouse Name: N/A    Number of Children: N/A  . Years of Education: N/A   Occupational History  . Not on file.   Social History Main Topics  . Smoking status: Never Smoker   . Smokeless tobacco: Never Used  . Alcohol Use: Yes     Comment: occasional  . Drug Use: No  . Sexually Active: Not on file   Other Topics Concern  . Not on file   Social History Narrative   Lives with husband in one level house.    Objective:  BP 118/68  Pulse 77  Temp 97.9 F (36.6 C) (Oral)  Resp 16  Wt 246 lb 4 oz (111.698 kg)  SpO2 95%  General appearance: alert,  cooperative and appears stated age Ears: normal TM's and external ear canals both ears Throat: lips, mucosa, and tongue normal; teeth and gums normal Neck: no adenopathy, no carotid bruit, supple, symmetrical, trachea midline and thyroid not enlarged, symmetric, no tenderness/mass/nodules Back: symmetric, no curvature. ROM normal. No CVA tenderness. Lungs: clear to auscultation bilaterally Heart: regular rate and rhythm, S1, S2 normal, no murmur, click, rub or gallop Abdomen: soft, non-tender; bowel sounds normal; no masses,  no organomegaly Pulses: 2+ and symmetric Skin: multiple excorations some actively bleeding,  Others healing no signs of infection or rash Lymph nodes: Cervical, supraclavicular, and axillary nodes  normal.  Assessment and Plan:  Neurotic excoriations Trial of hydroxyzine .  Moisturize skin fully with Eucerin after bathing.   Insomnia, persistent Reviewed meds with caregiver.  Patient needs structure and should not be allowed to stay in bed past 8 am. To bed by 10 PM   Obesity (BMI 30-39.9) With deconditioning and diabetes.  Diet discussed,  continue home PT and daily walking    Updated Medication List Outpatient Encounter Prescriptions as of 08/31/2012  Medication Sig Dispense Refill  . aspirin (QC LO-DOSE ASPIRIN) 81 MG EC tablet Take 1 tablet (81 mg total) by mouth daily with lunch. Swallow whole.  30 tablet  11  . aspirin 81 MG tablet Take 1 tablet (81 mg total) by mouth daily.  30 tablet  5  . beta carotene w/minerals (OCUVITE) tablet TAKE ONE TABLET BY MOUTH EVERY DAY.  30 tablet  11  . Calcium Carbonate (CALCIUM 500 PO) Take by mouth.      Marland Kitchen CLARITIN 10 MG tablet TAKE ONE TABLET BY MOUTH EVERY DAY.  30 tablet  11  . ergocalciferol (DRISDOL) 50000 UNITS capsule Take 1 capsule (50,000 Units total) by mouth once a week.  4 capsule  2  . Fluticasone-Salmeterol (ADVAIR DISKUS) 100-50 MCG/DOSE AEPB Inhale 1 puff into the lungs every 12 (twelve) hours.  60 each  11  . HYDROcodone-acetaminophen (NORCO/VICODIN) 5-325 MG per tablet Take 1 tablet by mouth every 6 (six) hours as needed for pain.  60 tablet  2  . LEXAPRO 20 MG tablet TAKE (1) TABLET BY MOUTH DAILY.  30 tablet  5  . LIPITOR 40 MG tablet TAKE ONE TABLET BY MOUTH EVERY DAY.  30 tablet  5  . losartan-hydrochlorothiazide (HYZAAR) 50-12.5 MG per tablet Take 1 tablet by mouth daily with breakfast.  90 tablet  3  . Multiple Vitamin (MULTIVITAMIN) tablet Take 1 tablet by mouth daily.      . Omega-3 Fatty Acids (FISH OIL) 1000 MG CAPS Take 1 capsule (1,000 mg total) by mouth daily. With lunch or dinner  30 capsule  5  . omeprazole (PRILOSEC) 40 MG capsule Take 1 capsule (40 mg total) by mouth every morning.  30 capsule  5  .  QUEtiapine (SEROQUEL) 50 MG tablet Take 1 tablet (50 mg total) by mouth at bedtime.  30 tablet  11  . sitaGLIPtan-metformin (JANUMET) 50-500 MG per tablet Take 1 tablet by mouth 2 (two) times daily with a meal.  60 tablet  5  . TESSALON 200 MG capsule TAKE 1 CAPSULE BY MOUTH EVERY 8 HOURS AS NEEDED FOR COUGH.  60 capsule  0  . zolpidem (AMBIEN) 5 MG tablet TAKE 1 TABLET AT BEDTIME AS NEEDED  30 tablet  2  . [DISCONTINUED] aspirin 81 MG tablet Take 1 tablet (81 mg total) by mouth daily.  30 tablet  5  . [DISCONTINUED] Azelastine  HCl (ASTEPRO) 0.15 % SOLN One inhalation 2 times daily in each nostril      . [DISCONTINUED] JANUMET 50-500 MG per tablet TAKE ONE TABLET BY MOUTH 2 TIMES A DAY WITH A MEAL  60 tablet  5  . [DISCONTINUED] losartan-hydrochlorothiazide (HYZAAR) 50-12.5 MG per tablet Take 1 tablet by mouth daily.  90 tablet  3  . [DISCONTINUED] losartan-hydrochlorothiazide (HYZAAR) 50-12.5 MG per tablet Take 1 tablet by mouth daily.  90 tablet  3  . [DISCONTINUED] Omega-3 Fatty Acids (FISH OIL) 1000 MG CAPS Take 1 capsule (1,000 mg total) by mouth daily.  30 capsule  5  . [DISCONTINUED] PRILOSEC 40 MG capsule TAKE 1 CAPSULE BY MOUTH DAILY.  30 capsule  5  . [DISCONTINUED] QC LO-DOSE ASPIRIN 81 MG EC tablet TAKE ONE TABLET BY MOUTH EVERY DAY.  30 tablet  11  . [DISCONTINUED] QUEtiapine (SEROQUEL) 50 MG tablet       . hydrOXYzine (ATARAX/VISTARIL) 25 MG tablet Take 1 tablet (25 mg total) by mouth 3 (three) times daily as needed for itching.  90 tablet  3  . [DISCONTINUED] lisinopril-hydrochlorothiazide (PRINZIDE,ZESTORETIC) 20-25 MG per tablet          No orders of the defined types were placed in this encounter.    No Follow-up on file.

## 2012-09-02 ENCOUNTER — Telehealth: Payer: Self-pay | Admitting: Internal Medicine

## 2012-09-02 ENCOUNTER — Encounter: Payer: Self-pay | Admitting: Internal Medicine

## 2012-09-02 DIAGNOSIS — L981 Factitial dermatitis: Secondary | ICD-10-CM | POA: Insufficient documentation

## 2012-09-02 MED ORDER — BENZONATATE 200 MG PO CAPS: 200.0000 mg | ORAL_CAPSULE | Freq: Two times a day (BID) | ORAL | Status: DC | PRN

## 2012-09-02 NOTE — Assessment & Plan Note (Signed)
Trial of hydroxyzine .  Moisturize skin fully with Eucerin after bathing.

## 2012-09-02 NOTE — Telephone Encounter (Signed)
[name] calling stating Sheena [name]'s meds are not up to date.  Pt needs Tessalon and hydrocodone and [facility] do not have it on [facility], it has been so long.

## 2012-09-02 NOTE — Assessment & Plan Note (Signed)
With deconditioning and diabetes.  Diet discussed,  continue home PT and daily walking

## 2012-09-02 NOTE — Telephone Encounter (Signed)
Those are PRN medications and narcotics and per caregiver visit yesterday, she was not having any pain so I do not want to refill the hydrocodone.  the tessalon is for cough and I will refill that .  Done via epic.

## 2012-09-02 NOTE — Telephone Encounter (Signed)
Is it ok to fill these meds the pt has been seen on 08/31/12.

## 2012-09-02 NOTE — Assessment & Plan Note (Signed)
Reviewed meds with caregiver.  Patient needs structure and should not be allowed to stay in bed past 8 am. To bed by 10 PM

## 2012-09-08 ENCOUNTER — Other Ambulatory Visit: Payer: Self-pay | Admitting: Internal Medicine

## 2012-10-12 ENCOUNTER — Other Ambulatory Visit: Payer: Self-pay | Admitting: Internal Medicine

## 2012-10-12 ENCOUNTER — Telehealth: Payer: Self-pay | Admitting: Internal Medicine

## 2012-10-12 MED ORDER — HYDROCODONE-ACETAMINOPHEN 5-325 MG PO TABS: 1.0000 | ORAL_TABLET | Freq: Four times a day (QID) | ORAL | Status: DC | PRN

## 2012-10-12 NOTE — Telephone Encounter (Signed)
Ok to refill,  Authorized in epic 

## 2012-10-12 NOTE — Telephone Encounter (Signed)
Pt is needing Hydrocodone refilled for her back pain. [facility] uses [facility] [address].

## 2012-10-13 NOTE — Telephone Encounter (Signed)
Medication phoned in on 10/13/12.  

## 2012-10-28 ENCOUNTER — Encounter: Payer: Self-pay | Admitting: Internal Medicine

## 2012-10-28 ENCOUNTER — Ambulatory Visit (INDEPENDENT_AMBULATORY_CARE_PROVIDER_SITE_OTHER): Payer: 59 | Admitting: Internal Medicine

## 2012-10-28 VITALS — BP 118/68 | HR 90 | Temp 97.9°F | Resp 16 | Wt 237.5 lb

## 2012-10-28 DIAGNOSIS — L981 Factitial dermatitis: Secondary | ICD-10-CM

## 2012-10-28 DIAGNOSIS — E119 Type 2 diabetes mellitus without complications: Secondary | ICD-10-CM

## 2012-10-28 DIAGNOSIS — R5381 Other malaise: Secondary | ICD-10-CM

## 2012-10-28 DIAGNOSIS — R5383 Other fatigue: Secondary | ICD-10-CM

## 2012-10-28 DIAGNOSIS — E039 Hypothyroidism, unspecified: Secondary | ICD-10-CM

## 2012-10-28 DIAGNOSIS — G4700 Insomnia, unspecified: Secondary | ICD-10-CM

## 2012-10-28 DIAGNOSIS — E1169 Type 2 diabetes mellitus with other specified complication: Secondary | ICD-10-CM

## 2012-10-28 DIAGNOSIS — R2689 Other abnormalities of gait and mobility: Secondary | ICD-10-CM

## 2012-10-28 DIAGNOSIS — M6281 Muscle weakness (generalized): Secondary | ICD-10-CM

## 2012-10-28 DIAGNOSIS — E669 Obesity, unspecified: Secondary | ICD-10-CM

## 2012-10-28 DIAGNOSIS — R29818 Other symptoms and signs involving the nervous system: Secondary | ICD-10-CM

## 2012-10-28 LAB — COMPREHENSIVE METABOLIC PANEL
Albumin: 3.4 g/dL (ref 3.4–5.0)
Alkaline Phosphatase: 92 U/L (ref 50–136)
Anion Gap: 13 (ref 7–16)
Calcium, Total: 8.2 mg/dL — ABNORMAL LOW (ref 8.5–10.1)
Chloride: 104 mmol/L (ref 98–107)
Co2: 23 mmol/L (ref 21–32)
EGFR (Non-African Amer.): 25 — ABNORMAL LOW
Glucose: 188 mg/dL — ABNORMAL HIGH (ref 65–99)
Osmolality: 289 (ref 275–301)
Potassium: 4.1 mmol/L (ref 3.5–5.1)
SGPT (ALT): 24 U/L (ref 12–78)
Sodium: 140 mmol/L (ref 136–145)
Total Protein: 7.6 g/dL (ref 6.4–8.2)

## 2012-10-28 LAB — CBC WITH DIFFERENTIAL/PLATELET
Basophils Absolute: 0.1 10*3/uL (ref 0.0–0.1)
Basophils Relative: 1 % (ref 0–1)
MCHC: 32.8 g/dL (ref 30.0–36.0)
Monocytes Absolute: 0.6 10*3/uL (ref 0.1–1.0)
Neutro Abs: 4 10*3/uL (ref 1.7–7.7)
Neutrophils Relative %: 64 % (ref 43–77)
RDW: 13.1 % (ref 11.5–15.5)

## 2012-10-28 LAB — CBC
HCT: 35.9 % (ref 35.0–47.0)
MCH: 29.6 pg (ref 26.0–34.0)
MCHC: 32.6 g/dL (ref 32.0–36.0)
WBC: 9.3 10*3/uL (ref 3.6–11.0)

## 2012-10-28 LAB — HEMOGLOBIN A1C: Mean Plasma Glucose: 157 mg/dL — ABNORMAL HIGH (ref <117)

## 2012-10-28 LAB — CK TOTAL AND CKMB (NOT AT ARMC): CK-MB: <0.5 ng/mL — ABNORMAL LOW (ref 0.5–3.6)

## 2012-10-28 LAB — TROPONIN I: Troponin-I: <0.02 ng/mL

## 2012-10-28 MED ORDER — INSULIN GLARGINE 100 UNIT/ML ~~LOC~~ SOLN: 15.0000 [IU] | Freq: Every day | SUBCUTANEOUS | Status: DC

## 2012-10-28 MED ORDER — INSULIN PEN NEEDLE 31G X 8 MM MISC: 1.0000 | Freq: Every day | Status: DC

## 2012-10-28 NOTE — Patient Instructions (Addendum)
We are starting Lantus Insulin at bedtime 15 units at bedtime   We will schedule Sheena [name] to come back in 2 weeks to reevaluate her blood sugars and follow up on the other issues

## 2012-10-28 NOTE — Progress Notes (Signed)
Patient ID: Sheena [name], female   DOB: 17-Oct-1923, 79 y.o.   MRN: [id number]   Patient Active Problem List  Diagnosis  . C O P D  . Insomnia, persistent  . Severe major depression with psychotic features  . Obesity (BMI 30-39.9)  . Diabetes mellitus type 2 in obese  . Melanoma  . Asthma  . Loss of balance  . Neurotic excoriations    Subjective:  CC:   Chief Complaint  Patient presents with  . Diabetes    HPI:   Sheena [name] a 79 y.o. female who presents for two-month followup on chronic issues including diabetes mellitus, obesity, dementia, neurotic excoriations, and decreased physical conditioning. Patient is accompanied by her caregiver today. Caregiver brings the last several days of blood sugar readings along with a  detailed note from her daughter [name] listing several  concerns including a request for a new lift chair. Patient cannot rise from a seated position without considerable help due to proximal  muscleweakness and history of  orthostasis. Her current lift chair is not function correctly. She walks with a walker but here in the office is using a cane.  ughter is quite happy with the professional care that her current daytime caregivers are providing  But continues to feel that she must remain in the house at night  With Mom as this would cause undue emotional stress of her mother. All caregivers are concerned the patient's blood sugars have been elevated despite medication administration and attention to diet. Medication administration is complicated by patient's sleep schedule. 1st  meal 1:30 to 3:00   gets janumet at 10:00  Sugar is highest at 1:3 at 217  .  After dinner checked at 10:30 pm 181 ,. Caregiver feels that the neurotic itching has improved with use of hydroxyzine.    Past Medical History  Diagnosis Date  . H/O: hysterectomy 1973    fibroids  . Lumbago   . Left arm pain   . Diabetes mellitus, type 2   . Melanoma     left arm, resected, annual  CXR's ordered  . Asthma     treated by Dr. [place] Callas  . Depression     Past Surgical History  Procedure Laterality Date  . Total hip arthroplasty  2003    left, Dr. Cleophas Dunker  . Breast biopsy  1985    normal  . Joint replacement   2003    Left Hip  . Abdominal hysterectomy  1973    fibroid uterus       The following portions of the patient's history were reviewed and updated as appropriate: Allergies, current medications, and problem list.    Review of Systems:   Patient denies headache, fevers, malaise, unintentional weight loss, skin rash, eye pain, sinus congestion and sinus pain, sore throat, dysphagia,  hemoptysis , cough, dyspnea, wheezing, chest pain, palpitations, orthopnea, edema, abdominal pain, nausea, melena, diarrhea, constipation, flank pain, dysuria, hematuria, urinary  Frequency, nocturia, numbness, tingling, seizures,  Focal weakness, Loss of consciousness,  Tremor, insomnia, depression, anxiety, and suicidal ideation.        History   Social History  . Marital Status: Married    Spouse Name: N/A    Number of Children: N/A  . Years of Education: N/A   Occupational History  . Not on file.   Social History Main Topics  . Smoking status: Never Smoker   . Smokeless tobacco: Never Used  . Alcohol Use: No  Comment: occasional  . Drug Use: No  . Sexually Active: No   Other Topics Concern  . Not on file   Social History Narrative   Widowed 2013 after 60+ years of marriage.   One level house,  2 daytime caregivers and daughter livers with her temporarily     Objective:  BP 118/68  Pulse 90  Temp(Src) 97.9 F (36.6 C) (Oral)  Resp 16  Wt 237 lb 8 oz (107.729 kg)  BMI 38.35 kg/m2  SpO2 97%  General appearance: alert, cooperative and appears stated age Ears: normal TM's and external ear canals both ears Throat: lips, mucosa, and tongue normal; teeth and gums normal Neck: no adenopathy, no carotid bruit, supple, symmetrical, trachea  midline and thyroid not enlarged, symmetric, no tenderness/mass/nodules Back: symmetric, no curvature. ROM normal. No CVA tenderness. Lungs: clear to auscultation bilaterally Heart: regular rate and rhythm, S1, S2 normal, no murmur, click, rub or gallop Abdomen: soft, non-tender; bowel sounds normal; no masses,  no organomegaly Pulses: 2+ and symmetric Skin: Skin color, texture, turgor normal. No rashes or lesions Lymph nodes: Cervical, supraclavicular, and axillary nodes normal.  Assessment and Plan:  Neurotic excoriations Degrees 6 grazes and lesions on her arms and legs have completely healed. We'll continue  Loss of balance She has had no recent falls but is not walking very much. We will continue physical therapy.  Diabetes mellitus type 2 in obese A1c has actually improved from 7.3-7.1. No changes today.  Insomnia, persistent Secondary to major depression and dementia. At 27 I not sure we're going to be able to change her sleeping pattern without subjecting her increased risk with more sedation.  Continue current medications of hydroxyzine and 5 mg Ambien daily.  A total of 40 minutes was spent with patient more than half of which was spent in counseling, reviewing records from other prviders and coordination of care.   Updated Medication List Outpatient Encounter Prescriptions as of 10/28/2012  Medication Sig Dispense Refill  . aspirin (QC LO-DOSE ASPIRIN) 81 MG EC tablet Take 1 tablet (81 mg total) by mouth daily with lunch. Swallow whole.  30 tablet  11  . benzonatate (TESSALON) 200 MG capsule Take 1 capsule (200 mg total) by mouth 2 (two) times daily as needed for cough.  60 capsule  2  . beta carotene w/minerals (OCUVITE) tablet TAKE ONE TABLET BY MOUTH EVERY DAY.  30 tablet  11  . Calcium Carbonate (CALCIUM 500 PO) Take by mouth.      Marland Kitchen CLARITIN 10 MG tablet TAKE ONE TABLET BY MOUTH EVERY DAY.  30 tablet  11  . ergocalciferol (DRISDOL) 50000 UNITS capsule Take 1 capsule  (50,000 Units total) by mouth once a week.  4 capsule  2  . Fluticasone-Salmeterol (ADVAIR DISKUS) 100-50 MCG/DOSE AEPB Inhale 1 puff into the lungs every 12 (twelve) hours.  60 each  11  . HYDROcodone-acetaminophen (NORCO/VICODIN) 5-325 MG per tablet Take 1 tablet by mouth every 6 (six) hours as needed for pain.  60 tablet  2  . hydrOXYzine (ATARAX/VISTARIL) 25 MG tablet Take 1 tablet (25 mg total) by mouth 3 (three) times daily as needed for itching.  90 tablet  3  . LEXAPRO 20 MG tablet TAKE (1) TABLET BY MOUTH DAILY.  30 tablet  5  . LIPITOR 40 MG tablet TAKE ONE TABLET BY MOUTH EVERY DAY.  30 tablet  5  . losartan-hydrochlorothiazide (HYZAAR) 50-12.5 MG per tablet Take 1 tablet by mouth daily with breakfast.  90 tablet  3  . Multiple Vitamin (MULTIVITAMIN) tablet Take 1 tablet by mouth daily.      . Omega-3 Fatty Acids (FISH OIL) 1000 MG CAPS TAKE ONE CAPSULE BY MOUTH EVERY DAY.  30 capsule  11  . omeprazole (PRILOSEC) 40 MG capsule Take 1 capsule (40 mg total) by mouth every morning.  30 capsule  5  . QUEtiapine (SEROQUEL) 50 MG tablet Take 1 tablet (50 mg total) by mouth at bedtime.  30 tablet  11  . sitaGLIPtan-metformin (JANUMET) 50-500 MG per tablet Take 1 tablet by mouth 2 (two) times daily with a meal.  60 tablet  5  . zolpidem (AMBIEN) 5 MG tablet TAKE 1 TABLET AT BEDTIME AS NEEDED  30 tablet  2  . insulin glargine (LANTUS SOLOSTAR) 100 UNIT/ML injection Inject 0.15 mLs (15 Units total) into the skin at bedtime.  5 pen  PRN  . Insulin Pen Needle 31G X 8 MM MISC 1 Syringe by Does not apply route daily.  30 each  0  . [DISCONTINUED] aspirin 81 MG tablet Take 1 tablet (81 mg total) by mouth daily.  30 tablet  5   No facility-administered encounter medications on file as of 10/28/2012.     Orders Placed This Encounter  Procedures  . TSH  . CBC with Differential  . Comprehensive metabolic panel  . Hemoglobin A1c    No Follow-up on file.

## 2012-10-29 ENCOUNTER — Inpatient Hospital Stay: Payer: Self-pay | Admitting: Internal Medicine

## 2012-10-29 ENCOUNTER — Ambulatory Visit: Payer: Self-pay | Admitting: Orthopedic Surgery

## 2012-10-29 LAB — COMPREHENSIVE METABOLIC PANEL
AST: 18 U/L (ref 0–37)
Albumin: 4 g/dL (ref 3.5–5.2)
Alkaline Phosphatase: 72 U/L (ref 39–117)
BUN: 21 mg/dL (ref 6–23)
Potassium: 4.5 mEq/L (ref 3.5–5.3)
Sodium: 138 mEq/L (ref 135–145)
Total Bilirubin: 0.5 mg/dL (ref 0.3–1.2)

## 2012-10-29 LAB — TROPONIN I: Troponin-I: <0.02 ng/mL

## 2012-10-29 LAB — CK-MB
CK-MB: <0.5 ng/mL — ABNORMAL LOW (ref 0.5–3.6)
CK-MB: <0.5 ng/mL — ABNORMAL LOW (ref 0.5–3.6)

## 2012-10-29 LAB — TSH: TSH: 2.257 u[IU]/mL (ref 0.350–4.500)

## 2012-10-30 ENCOUNTER — Encounter: Payer: Self-pay | Admitting: Internal Medicine

## 2012-10-30 DIAGNOSIS — M6281 Muscle weakness (generalized): Secondary | ICD-10-CM | POA: Insufficient documentation

## 2012-10-30 LAB — BASIC METABOLIC PANEL
Anion Gap: 6 — ABNORMAL LOW (ref 7–16)
BUN: 18 mg/dL (ref 7–18)
Chloride: 107 mmol/L (ref 98–107)
Co2: 25 mmol/L (ref 21–32)
Creatinine: 1.41 mg/dL — ABNORMAL HIGH (ref 0.60–1.30)
EGFR (Non-African Amer.): 34 — ABNORMAL LOW
Potassium: 3.9 mmol/L (ref 3.5–5.1)
Sodium: 138 mmol/L (ref 136–145)

## 2012-10-30 NOTE — Assessment & Plan Note (Signed)
She has had no recent falls but is not walking very much. We will continue physical therapy.

## 2012-10-30 NOTE — Assessment & Plan Note (Addendum)
Secondary to major depression and dementia. At 33 I not sure we're going to be able to change her sleeping pattern without subjecting her increased risk with more sedation.  Continue current medications of hydroxyzine and 5 mg Ambien daily.

## 2012-10-30 NOTE — Assessment & Plan Note (Signed)
Degrees 6 grazes and lesions on her arms and legs have completely healed. We'll continue

## 2012-10-30 NOTE — Assessment & Plan Note (Addendum)
Chronic due to dig deconditioning and degenerative disc disease seen on prior films of lower back . Her current lift chair is not functioning well and she is at increased risk for fall. We have written an order for a new lift chair.

## 2012-10-30 NOTE — Assessment & Plan Note (Signed)
A1c has actually improved from 7.3-7.1. No changes today.

## 2012-10-31 ENCOUNTER — Encounter: Payer: Self-pay | Admitting: General Practice

## 2012-11-01 ENCOUNTER — Telehealth: Payer: Self-pay | Admitting: Internal Medicine

## 2012-11-01 ENCOUNTER — Other Ambulatory Visit: Payer: Self-pay | Admitting: Internal Medicine

## 2012-11-01 NOTE — Telephone Encounter (Signed)
meds filled

## 2012-11-01 NOTE — Telephone Encounter (Signed)
Hospital follow up 4.8.14 .

## 2012-11-01 NOTE — Telephone Encounter (Signed)
Please calirfy

## 2012-11-01 NOTE — Telephone Encounter (Signed)
Called pt today to see how she was feeling after her hospital stay. No answer. Left message on voicemail for pt to return call.

## 2012-11-02 ENCOUNTER — Telehealth: Payer: Self-pay | Admitting: Internal Medicine

## 2012-11-02 NOTE — Telephone Encounter (Signed)
Caller: [name]/Child; Phone: [phone]; Reason for Call: Daughter called to report Sheena [name] fractured left ankle after syncope on commode 10/28/12 after office visit for vomiting.  Was admitted to [facility] and transferred 10/30/12 to [facility].  ED suspected Vasovagal response.  Has appointment with Dr.Tullo for 11/15/12.

## 2012-11-02 NOTE — Telephone Encounter (Signed)
Caller: [name]/Child; Phone: [phone]; Reason for Call: Daughter called to report Annjeanette fractured left ankle after syncope on commode 10/28/12 after office visit for vomiting.  Was admitted to [facility] and transferred 10/30/12 to [facility].  ED suspected Vasovagal response.  Has appointment with Dr.Tullo for 11/15/12. ° °

## 2012-11-03 ENCOUNTER — Telehealth: Payer: Self-pay | Admitting: Internal Medicine

## 2012-11-03 NOTE — Telephone Encounter (Signed)
Pt daughter stated that pt seen Dr. Ernest Pine. As far as she knows their office should be handling these orders.

## 2012-11-03 NOTE — Telephone Encounter (Signed)
I have gotten 4 requests for an AFG stabilizer on Ms Sheena [name].  I dont' know who generated the order and why. Please find out from doaughter,  It is not something I typically order,  Usually orthopedics does, and now that she has had a fracture, i do not know whether it has been addressed already by Orhtopedics

## 2012-11-10 ENCOUNTER — Encounter: Payer: Self-pay | Admitting: Internal Medicine

## 2012-11-15 ENCOUNTER — Encounter: Payer: Self-pay | Admitting: Internal Medicine

## 2012-11-15 ENCOUNTER — Ambulatory Visit (INDEPENDENT_AMBULATORY_CARE_PROVIDER_SITE_OTHER): Payer: 59 | Admitting: Internal Medicine

## 2012-11-15 VITALS — BP 120/60 | HR 88 | Temp 98.5°F | Resp 16

## 2012-11-15 DIAGNOSIS — Z87448 Personal history of other diseases of urinary system: Secondary | ICD-10-CM

## 2012-11-15 DIAGNOSIS — S82892D Other fracture of left lower leg, subsequent encounter for closed fracture with routine healing: Secondary | ICD-10-CM

## 2012-11-15 DIAGNOSIS — E669 Obesity, unspecified: Secondary | ICD-10-CM

## 2012-11-15 DIAGNOSIS — E1169 Type 2 diabetes mellitus with other specified complication: Secondary | ICD-10-CM

## 2012-11-15 DIAGNOSIS — S8290XD Unspecified fracture of unspecified lower leg, subsequent encounter for closed fracture with routine healing: Secondary | ICD-10-CM

## 2012-11-15 DIAGNOSIS — S82892A Other fracture of left lower leg, initial encounter for closed fracture: Secondary | ICD-10-CM | POA: Insufficient documentation

## 2012-11-15 DIAGNOSIS — E119 Type 2 diabetes mellitus without complications: Secondary | ICD-10-CM

## 2012-11-15 DIAGNOSIS — Z87898 Personal history of other specified conditions: Secondary | ICD-10-CM

## 2012-11-15 DIAGNOSIS — Z9189 Other specified personal risk factors, not elsewhere classified: Secondary | ICD-10-CM

## 2012-11-15 MED ORDER — SITAGLIPTIN PHOSPHATE 100 MG PO TABS: 100.0000 mg | ORAL_TABLET | Freq: Every day | ORAL | Status: DC

## 2012-11-15 NOTE — Progress Notes (Signed)
Patient ID: Sheena [name], female   DOB: [birth year], 79 y.o.   MRN: [id number]   Patient Active Problem List  Diagnosis  . C O P D  . Insomnia, persistent  . Severe major depression with psychotic features  . Obesity (BMI 30-39.9)  . Diabetes mellitus type 2 in obese  . Melanoma  . Asthma  . Loss of balance  . Neurotic excoriations  . Proximal muscle weakness  . History of syncope  . Ankle fracture, left  . H/O acute renal failure    Subjective:  CC:   Chief Complaint  Patient presents with  . [facility] Discharge    HPI:   Sheena [name] a 79 y.o. female who presents for hospital follow up after admission to [facility] on March 22 for syncopal episode which occurred after having a bowel movement . She was noted to be acute renal failure, and had a closed ankle fracture on the left. Head CT and echo of the heart were both normal. . she was seen by orthopedics and placed in an air boot and referred to Dr. Ernest Pine as an outpatient. She has not seen him yet, her appointment is Scheduled for April  14 .  She was discharged to [facility] on March 24 was transferred to [facility] facility  per patient request. She's been receiving physical therapy there. Her pain is under control. She's has gained several pounds and she's been there. Her caregiver today her blood sugars have been well controlled while at [facility].   Past Medical History  Diagnosis Date  . H/O: hysterectomy 1973    fibroids  . Lumbago   . Left arm pain   . Diabetes mellitus, type 2   . Melanoma     left arm, resected, annual CXR's ordered  . Asthma     treated by Dr. [place] Callas  . Depression     Past Surgical History  Procedure Laterality Date  . Total hip arthroplasty  2003    left, Dr. Cleophas Dunker  . Breast biopsy  1985    normal  . Joint replacement   2003    Left Hip  . Abdominal hysterectomy  1973    fibroid uterus       The following portions of the patient's history were reviewed and updated as  appropriate: Allergies, current medications, and problem list.    Review of Systems:   Patient denies headache, fevers, malaise, unintentional weight loss, skin rash, eye pain, sinus congestion and sinus pain, sore throat, dysphagia,  hemoptysis , cough, dyspnea, wheezing, chest pain, palpitations, orthopnea, edema, abdominal pain, nausea, melena, diarrhea, constipation, flank pain, dysuria, hematuria, urinary  Frequency, nocturia, numbness, tingling, seizures,  Focal weakness, Loss of consciousness,  Tremor, insomnia, depression, anxiety, and suicidal ideation.     History   Social History  . Marital Status: Married    Spouse Name: N/A    Number of Children: N/A  . Years of Education: N/A   Occupational History  . Not on file.   Social History Main Topics  . Smoking status: Never Smoker   . Smokeless tobacco: Never Used  . Alcohol Use: No     Comment: occasional  . Drug Use: No  . Sexually Active: No   Other Topics Concern  . Not on file   Social History Narrative   Widowed 2013 after 60+ years of marriage.   One level house,  2 daytime caregivers and daughter livers with her temporarily     Objective:  BP 120/60  Pulse 88  Temp(Src) 98.5 F (36.9 C) (Oral)  Resp 16  SpO2 96%  General appearance: alert, cooperative and appears stated age Ears: normal TM's and external ear canals both ears Throat: lips, mucosa, and tongue normal; teeth and gums normal Neck: no adenopathy, no carotid bruit, supple, symmetrical, trachea midline and thyroid not enlarged, symmetric, no tenderness/mass/nodules Back: symmetric, no curvature. ROM normal. No CVA tenderness. Lungs: clear to auscultation bilaterally Heart: regular rate and rhythm, S1, S2 normal, no murmur, click, rub or gallop Abdomen: soft, non-tender; bowel sounds normal; no masses,  no organomegaly Pulses: 2+ and symmetric Skin: Skin color, texture, turgor normal. No rashes or lesions Lymph nodes: Cervical,  supraclavicular, and axillary nodes normal.  Assessment and Plan:  Diabetes mellitus type 2 in obese Stopping janumet given recent episodes of ARF.  Blood sugars have been well controlled per her caregiver on a supervised diabetic diet.  H/O acute renal failure Given the recent episode of acute renal failure I decided to stop her metformin permanently. Resume Januvia 100 mg daily and continue Lantus  Ankle fracture, left She was evaluated during hospitalization by orthopedics and will followup with trauma clinic in mid April. She is wearing the cam boot and he recently had/PT at [facility].  History of syncope March 2014, post void, resulting in admission and workup with normal ECHO and head CT, ,and resulting in left Ankle fracture     Updated Medication List Outpatient Encounter Prescriptions as of 11/15/2012  Medication Sig Dispense Refill  . aspirin (QC LO-DOSE ASPIRIN) 81 MG EC tablet Take 1 tablet (81 mg total) by mouth daily with lunch. Swallow whole.  30 tablet  11  . atorvastatin (LIPITOR) 40 MG tablet TAKE ONE TABLET BY MOUTH EVERY DAY.  30 tablet  6  . benzonatate (TESSALON) 200 MG capsule Take 1 capsule (200 mg total) by mouth 2 (two) times daily as needed for cough.  60 capsule  2  . beta carotene w/minerals (OCUVITE) tablet TAKE ONE TABLET BY MOUTH EVERY DAY.  30 tablet  11  . Calcium Carbonate (CALCIUM 500 PO) Take by mouth.      Marland Kitchen CLARITIN 10 MG tablet TAKE ONE TABLET BY MOUTH EVERY DAY.  30 tablet  11  . ergocalciferol (DRISDOL) 50000 UNITS capsule Take 1 capsule (50,000 Units total) by mouth once a week.  4 capsule  2  . Fluticasone-Salmeterol (ADVAIR DISKUS) 100-50 MCG/DOSE AEPB Inhale 1 puff into the lungs every 12 (twelve) hours.  60 each  11  . HYDROcodone-acetaminophen (NORCO/VICODIN) 5-325 MG per tablet Take 1 tablet by mouth every 6 (six) hours as needed for pain.  60 tablet  2  . hydrOXYzine (ATARAX/VISTARIL) 25 MG tablet Take 1 tablet (25 mg total) by mouth 3  (three) times daily as needed for itching.  90 tablet  3  . insulin glargine (LANTUS SOLOSTAR) 100 UNIT/ML injection Inject 0.15 mLs (15 Units total) into the skin at bedtime.  5 pen  PRN  . Insulin Pen Needle 31G X 8 MM MISC 1 Syringe by Does not apply route daily.  30 each  0  . LEXAPRO 20 MG tablet TAKE (1) TABLET BY MOUTH DAILY.  30 tablet  5  . losartan-hydrochlorothiazide (HYZAAR) 50-12.5 MG per tablet Take 1 tablet by mouth daily with breakfast.  90 tablet  3  . Multiple Vitamin (MULTIVITAMIN) tablet Take 1 tablet by mouth daily.      . Omega-3 Fatty Acids (FISH OIL) 1000 MG CAPS  TAKE ONE CAPSULE BY MOUTH EVERY DAY.  30 capsule  11  . omeprazole (PRILOSEC) 40 MG capsule Take 1 capsule (40 mg total) by mouth every morning.  30 capsule  5  . QUEtiapine (SEROQUEL) 50 MG tablet Take 1 tablet (50 mg total) by mouth at bedtime.  30 tablet  11  . zolpidem (AMBIEN) 5 MG tablet TAKE 1 TABLET AT BEDTIME AS NEEDED  30 tablet  2  . [DISCONTINUED] JANUMET 50-500 MG per tablet TAKE ONE TABLET BY MOUTH 2 TIMES A DAY WITH A MEAL  60 tablet  6  . sitaGLIPtin (JANUVIA) 100 MG tablet Take 1 tablet (100 mg total) by mouth daily.  30 tablet  2   No facility-administered encounter medications on file as of 11/15/2012.     Orders Placed This Encounter  Procedures  . Basic metabolic panel    Return in about 3 months (around 02/14/2013).

## 2012-11-15 NOTE — Assessment & Plan Note (Signed)
Given the recent episode of acute renal failure I decided to stop her metformin permanently. Resume Januvia 100 mg daily and continue Lantus

## 2012-11-15 NOTE — Patient Instructions (Addendum)
We are changing your insulin medication  to remove the metformin to avoid pro blems whe your kidney function  Wavers.     Continue januvia and lantus.   Return in 3 months

## 2012-11-15 NOTE — Assessment & Plan Note (Signed)
March 2014, post void, resulting in admission and workup with normal ECHO and head CT, ,and resulting in left Ankle fracture

## 2012-11-15 NOTE — Assessment & Plan Note (Addendum)
Stopping janumet given recent episodes of ARF.  Blood sugars have been well controlled per her caregiver on a supervised diabetic diet.

## 2012-11-15 NOTE — Assessment & Plan Note (Signed)
She was evaluated during hospitalization by orthopedics and will followup with trauma clinic in mid April. She is wearing the cam boot and he recently had/PT at [facility].

## 2012-11-16 LAB — BASIC METABOLIC PANEL
BUN: 29 mg/dL — ABNORMAL HIGH (ref 6–23)
CO2: 25 mEq/L (ref 19–32)
GFR: 36.9 mL/min — ABNORMAL LOW (ref >60.00)
Glucose, Bld: 129 mg/dL — ABNORMAL HIGH (ref 70–99)
Potassium: 4.7 mEq/L (ref 3.5–5.1)

## 2012-11-19 LAB — URINALYSIS, COMPLETE
Bilirubin,UR: NEGATIVE
Glucose,UR: NEGATIVE mg/dL (ref 0–75)
Nitrite: NEGATIVE
Specific Gravity: 1.015 (ref 1.003–1.030)
Squamous Epithelial: 1
WBC UR: 257 /HPF (ref 0–5)

## 2012-11-21 LAB — URINE CULTURE

## 2012-12-01 ENCOUNTER — Telehealth: Payer: Self-pay

## 2012-12-01 MED ORDER — HYDROXYZINE HCL 25 MG PO TABS: 25.0000 mg | ORAL_TABLET | Freq: Three times a day (TID) | ORAL | Status: DC

## 2012-12-01 NOTE — Telephone Encounter (Signed)
Called [facility] and notified the nurse on duty of the changes to the patient prescription to give to the patient "three times daily". And im sending the prescription to [facility]

## 2012-12-01 NOTE — Telephone Encounter (Signed)
Please Advise..  Per Request from patients daughter Virgina Jock. The patient is at [facility] for Physical Therapy, pt daughter says that the pt is scratching her body until she bleeds.The nurses at [facility] will not give the patient the medication Hydroxyzine which is prescribed for the patient because the directions on how to take the medication is take one tablet by mouth 3 (three) times daily as needed for itching. Pt daugther wants to know if it is okay to change the directions on how the patient is suppose to take the medication to 3 times daily.   [facility] # [phone]

## 2012-12-01 NOTE — Telephone Encounter (Signed)
Please call [facility] and tell them to give it to her "three times daily" .  Rx on printer

## 2012-12-08 ENCOUNTER — Encounter: Payer: Self-pay | Admitting: Internal Medicine

## 2012-12-08 ENCOUNTER — Telehealth: Payer: Self-pay | Admitting: *Deleted

## 2012-12-08 NOTE — Telephone Encounter (Signed)
Leaving on Saturday from [facility] place and recommending skilled nursing, occupational therapy, physical therapy.

## 2012-12-08 NOTE — Telephone Encounter (Signed)
The doctor at [facility] needs to order this,  I have not had the Face to face" encounter that is necessary to order these services and get them paid for by insurance

## 2012-12-08 NOTE — Telephone Encounter (Signed)
Called Sheena [name] left detailed message that Dr. At [facility] would have to give the okay on home health for this patient.

## 2012-12-12 ENCOUNTER — Ambulatory Visit: Payer: 59 | Admitting: Adult Health

## 2012-12-12 NOTE — Telephone Encounter (Signed)
So you will know

## 2012-12-12 NOTE — Telephone Encounter (Signed)
Ms. Stanton Kidney left message on voicemail on regards to the patient needing papers signed. Dr. Dareen Piano can only sign the initial visit he can not continue to sign off on pt cause he will not be following patient. Pt had appointment to see Raquel today but they reschedule that appointment for tomorrow.

## 2012-12-12 NOTE — Telephone Encounter (Signed)
Make sure Fleet Contras has the form that was in your redfolder

## 2012-12-13 ENCOUNTER — Ambulatory Visit (INDEPENDENT_AMBULATORY_CARE_PROVIDER_SITE_OTHER): Payer: 59 | Admitting: Adult Health

## 2012-12-13 ENCOUNTER — Encounter: Payer: Self-pay | Admitting: Adult Health

## 2012-12-13 VITALS — BP 128/60 | HR 80 | Temp 97.8°F | Resp 12 | Wt 221.5 lb

## 2012-12-13 DIAGNOSIS — S8290XS Unspecified fracture of unspecified lower leg, sequela: Secondary | ICD-10-CM

## 2012-12-13 DIAGNOSIS — Z0189 Encounter for other specified special examinations: Secondary | ICD-10-CM

## 2012-12-13 DIAGNOSIS — S82892S Other fracture of left lower leg, sequela: Secondary | ICD-10-CM

## 2012-12-13 NOTE — Patient Instructions (Addendum)
  I am referring you to outpatient physical therapy for left ankle fracture.  They will contact you to set up an appointment with you.

## 2012-12-13 NOTE — Progress Notes (Signed)
Subjective:    Patient ID: Sheena [name], female    DOB: 10/16/1923, 79 y.o.   MRN: [id number]  HPI  Patient is a pleasant 79 y/o female who was recently discharged from [facility] after fracture of left ankle. Patient was discharged home with order to continue physical therapy. Patient was scheduled appointment today for home health referral for PT. She is feeling well. Currently denies any complaints. Patient has 24 hour caregivers in her home.   Current Outpatient Prescriptions on File Prior to Visit  Medication Sig Dispense Refill  . aspirin (QC LO-DOSE ASPIRIN) 81 MG EC tablet Take 1 tablet (81 mg total) by mouth daily with lunch. Swallow whole.  30 tablet  11  . atorvastatin (LIPITOR) 40 MG tablet TAKE ONE TABLET BY MOUTH EVERY DAY.  30 tablet  6  . beta carotene w/minerals (OCUVITE) tablet TAKE ONE TABLET BY MOUTH EVERY DAY.  30 tablet  11  . Calcium Carbonate (CALCIUM 500 PO) Take by mouth.      Marland Kitchen CLARITIN 10 MG tablet TAKE ONE TABLET BY MOUTH EVERY DAY.  30 tablet  11  . ergocalciferol (DRISDOL) 50000 UNITS capsule Take 1 capsule (50,000 Units total) by mouth once a week.  4 capsule  2  . Fluticasone-Salmeterol (ADVAIR DISKUS) 100-50 MCG/DOSE AEPB Inhale 1 puff into the lungs every 12 (twelve) hours.  60 each  11  . HYDROcodone-acetaminophen (NORCO/VICODIN) 5-325 MG per tablet Take 1 tablet by mouth every 6 (six) hours as needed for pain.  60 tablet  2  . hydrOXYzine (ATARAX/VISTARIL) 25 MG tablet Take 1 tablet (25 mg total) by mouth 3 (three) times daily.  90 tablet  3  . insulin glargine (LANTUS SOLOSTAR) 100 UNIT/ML injection Inject 0.15 mLs (15 Units total) into the skin at bedtime.  5 pen  PRN  . Insulin Pen Needle 31G X 8 MM MISC 1 Syringe by Does not apply route daily.  30 each  0  . LEXAPRO 20 MG tablet TAKE (1) TABLET BY MOUTH DAILY.  30 tablet  5  . losartan-hydrochlorothiazide (HYZAAR) 50-12.5 MG per tablet Take 1 tablet by mouth daily with breakfast.  90 tablet  3   . Multiple Vitamin (MULTIVITAMIN) tablet Take 1 tablet by mouth daily.      . Omega-3 Fatty Acids (FISH OIL) 1000 MG CAPS TAKE ONE CAPSULE BY MOUTH EVERY DAY.  30 capsule  11  . omeprazole (PRILOSEC) 40 MG capsule Take 1 capsule (40 mg total) by mouth every morning.  30 capsule  5  . QUEtiapine (SEROQUEL) 50 MG tablet Take 1 tablet (50 mg total) by mouth at bedtime.  30 tablet  11  . sitaGLIPtin (JANUVIA) 100 MG tablet Take 1 tablet (100 mg total) by mouth daily.  30 tablet  2  . zolpidem (AMBIEN) 5 MG tablet TAKE 1 TABLET AT BEDTIME AS NEEDED  30 tablet  2   No current facility-administered medications on file prior to visit.     Review of Systems  Constitutional: Negative.   HENT: Negative.   Eyes: Negative.   Respiratory: Negative.   Cardiovascular: Negative.   Gastrointestinal: Negative.   Endocrine: Negative.   Genitourinary: Negative.   Musculoskeletal: Positive for joint swelling and gait problem.  Skin: Negative.   Allergic/Immunologic: Negative.   Neurological: Negative.   Hematological: Negative.   Psychiatric/Behavioral: Negative.        Objective:   Physical Exam  Constitutional: She is oriented to person, place, and time.  Obese,  pleasant elderly female in NAD.  Cardiovascular: Normal rate, regular rhythm and normal heart sounds.   Pulmonary/Chest: Breath sounds normal. No respiratory distress. She has no wheezes. She has no rales.  Abdominal: Soft. Bowel sounds are normal.  Musculoskeletal: She exhibits edema.  Mild edema left ankle. She is ambulating with walker.  Neurological: She is alert and oriented to person, place, and time.  Skin: Skin is warm and dry.  Psychiatric: She has a normal mood and affect. Her behavior is normal. Judgment and thought content normal.          Assessment & Plan:

## 2012-12-13 NOTE — Assessment & Plan Note (Signed)
Benefit from continued physical therapy given recent fracture of left ankle. She is able to get out of her home and go to outpatient facility so will order referral for outpatient physical therapy. Patient and caregiver were in agreement with plan.

## 2012-12-14 NOTE — Telephone Encounter (Signed)
Form not found called care path for new form no response to date

## 2012-12-21 ENCOUNTER — Telehealth: Payer: Self-pay | Admitting: *Deleted

## 2012-12-21 NOTE — Telephone Encounter (Signed)
Daughter states 5 mg Ambien is not working and could you please put her mother back on the 10 mg.

## 2012-12-22 NOTE — Telephone Encounter (Signed)
i will not increase the dose because I could lose my license.  I am happy to refill the ambien early but only 5 mg #30 , and I will be happy to refer Sheena [name] to a neurologist or psychiatrist if necessary

## 2012-12-22 NOTE — Telephone Encounter (Signed)
No, I cannot.  5 mg is the maximal safe dose allowed  in elderly patients.  She has two other medications she can use to help her sleep depending on the issue (if it is agitation, she can resume the seroquel ,  Or generic quetiapine.  If the problem is plain insonmnia,  The medication for itching (hydroxyzine,  25 or 50 mg can be used) to supplement the [place].

## 2012-12-22 NOTE — Telephone Encounter (Signed)
Informed patient daughter  Of your instructions Sheena [name] she stated she has been giving her mother two Ambien at bed time ( for total of 10 mg) for Approximately two weeks with the other medications. I informed her that the max safe dose is 5 mg.  Patient daughter stated we will need refill sooner because of giving double the dose. Patient also stated we are giving mother all those medications.

## 2012-12-23 NOTE — Telephone Encounter (Signed)
Patient daughter sated will call for refill next week.

## 2012-12-27 ENCOUNTER — Other Ambulatory Visit: Payer: Self-pay | Admitting: Internal Medicine

## 2012-12-27 ENCOUNTER — Ambulatory Visit (INDEPENDENT_AMBULATORY_CARE_PROVIDER_SITE_OTHER): Payer: 59 | Admitting: Internal Medicine

## 2012-12-27 ENCOUNTER — Encounter: Payer: Self-pay | Admitting: Internal Medicine

## 2012-12-27 VITALS — BP 132/66 | HR 78 | Temp 97.4°F | Resp 18 | Wt 229.5 lb

## 2012-12-27 DIAGNOSIS — F323 Major depressive disorder, single episode, severe with psychotic features: Secondary | ICD-10-CM

## 2012-12-27 DIAGNOSIS — R2689 Other abnormalities of gait and mobility: Secondary | ICD-10-CM

## 2012-12-27 DIAGNOSIS — R3981 Functional urinary incontinence: Secondary | ICD-10-CM

## 2012-12-27 DIAGNOSIS — G4700 Insomnia, unspecified: Secondary | ICD-10-CM

## 2012-12-27 DIAGNOSIS — R29818 Other symptoms and signs involving the nervous system: Secondary | ICD-10-CM

## 2012-12-27 DIAGNOSIS — L981 Factitial dermatitis: Secondary | ICD-10-CM

## 2012-12-27 MED ORDER — QUETIAPINE FUMARATE 50 MG PO TABS: 50.0000 mg | ORAL_TABLET | Freq: Two times a day (BID) | ORAL | Status: DC

## 2012-12-27 NOTE — Telephone Encounter (Signed)
Rx sent to pharmacy by escript  

## 2012-12-27 NOTE — Patient Instructions (Addendum)
Increase the hydroxyzine to 50  Mg three times daily (for itching and to see if the insomnia improves)  Take the ambien at 8 PM.  No water after that!!   For the mood swings,  Increase the seroquel to twice  Daily ( 50 mg )

## 2012-12-27 NOTE — Progress Notes (Signed)
Patient ID: Sheena [name], female   DOB: 1924/07/06, 79 y.o.   MRN: [id number]   . Patient Active Problem List   Diagnosis Date Noted  . Physical therapy evaluation, initial 12/13/2012  . History of syncope 11/15/2012  . Ankle fracture, left 11/15/2012  . H/O acute renal failure 11/15/2012  . Proximal muscle weakness 10/30/2012  . Neurotic excoriations 09/02/2012  . Loss of balance 06/06/2012  . Melanoma   . Asthma   . Insomnia, persistent 09/01/2011  . Severe major depression with psychotic features 09/01/2011  . Obesity (BMI 30-39.9) 09/01/2011  . Diabetes mellitus type 2 in obese 09/01/2011  . C O P D 01/13/2008    Subjective:  CC:   Chief Complaint  Patient presents with  . Follow-up    Patient states she cannot sleep only sleeps 3-4 hrs per night    HPI:   Sheena Clouatre Pattersonis a 79 y.o. female who presents with behavioral changes noticed by family since discharge from rehab.  She has been having persistent insomnia despite using a combination of  ambien 5 mg at bedtime, along with seroquel 50 mg and hydroxyzine 25 mg taken after dinner at 8 PM.  She is not wandering or having agitation, but her  caretaker states that she is awake until 3 am usually. Nocturia x 2 .  Patient's daughter Sheena [name] has requested resuming the 10 mg dose of ambien.  She has also noticed mood changes that occur almost daily, with patient becoming very surly to daughter and family.   Refuses to talk about her deceased husband even in reminiscence with daughter. A urinalysis was checked previously and was normal.  She is continuing to scratch at her arms to the point of bleeding despite using hydroxyzine  25 mg tid .  Blood sugars have been well controlled per caretaker    Past Medical History  Diagnosis Date  . H/O: hysterectomy 1973    fibroids  . Lumbago   . Left arm pain   . Diabetes mellitus, type 2   . Melanoma     left arm, resected, annual CXR's ordered  . Asthma     treated by Dr. [place] Callas   . Depression     Past Surgical History  Procedure Laterality Date  . Total hip arthroplasty  2003    left, Dr. Cleophas Dunker  . Breast biopsy  1985    normal  . Joint replacement   2003    Left Hip  . Abdominal hysterectomy  1973    fibroid uterus       The following portions of the patient's history were reviewed and updated as appropriate: Allergies, current medications, and problem list.    Review of Systems:  Patient denies headache, fevers, malaise, unintentional weight loss, skin rash, eye pain, sinus congestion and sinus pain, sore throat, dysphagia,  hemoptysis , cough, dyspnea, wheezing, chest pain, palpitations, orthopnea, edema, abdominal pain, nausea, melena, diarrhea, constipation, flank pain, dysuria, hematuria,, numbness, tingling, seizures,  Focal weakness, Loss of consciousness,  Tremor, depression, anxiety, and suicidal ideation.     History   Social History  . Marital Status: Married    Spouse Name: N/A    Number of Children: N/A  . Years of Education: N/A   Occupational History  . Not on file.   Social History Main Topics  . Smoking status: Never Smoker   . Smokeless tobacco: Never Used  . Alcohol Use: No     Comment: occasional  . Drug Use:  No  . Sexually Active: No   Other Topics Concern  . Not on file   Social History Narrative   Widowed 2013 after 60+ years of marriage.   One level house,  2 daytime caregivers and daughter livers with her temporarily     Objective:  BP 132/66  Pulse 78  Temp(Src) 97.4 F (36.3 C) (Oral)  Resp 18  Wt 229 lb 8 oz (104.101 kg)  BMI 37.06 kg/m2  SpO2 96%  General appearance: alert, cooperative and appears stated age Ears: normal TM's and external ear canals both ears Throat: lips, mucosa, and tongue normal; teeth and gums normal Neck: no adenopathy, no carotid bruit, supple, symmetrical, trachea midline and thyroid not enlarged, symmetric, no tenderness/mass/nodules Back: symmetric, no curvature.  ROM normal. No CVA tenderness. Lungs: clear to auscultation bilaterally Heart: regular rate and rhythm, S1, S2 normal, no murmur, click, rub or gallop Abdomen: soft, non-tender; bowel sounds normal; no masses,  no organomegaly Pulses: 2+ and symmetric Skin: Skin color, texture, turgor normal. No rashes or lesions Lymph nodes: Cervical, supraclavicular, and axillary nodes normal.  Assessment and Plan:  Severe major depression with psychotic features She is tolerating lexapro and has resumed seroquel with no significant improvement per family.  They are not interested in psychiatric referral.  Will increase seroquel to 50 mg twice daily   Neurotic excoriations Discussed increasing the hydroxyzine to 50 mg as needed.   Insomnia, persistent continue ambien 5 mg but take at 8 pm. ,  Increase hydroxyzine to 50 mg and seroquel to 50 mg bid.   Loss of balance With recent ankle fracture sustained during syncopal episode, requiring rehab and brace without surgical intervention,  Improved balance with PT>    Updated Medication List Outpatient Encounter Prescriptions as of 12/27/2012  Medication Sig Dispense Refill  . aspirin (QC LO-DOSE ASPIRIN) 81 MG EC tablet Take 1 tablet (81 mg total) by mouth daily with lunch. Swallow whole.  30 tablet  11  . atorvastatin (LIPITOR) 40 MG tablet TAKE ONE TABLET BY MOUTH EVERY DAY.  30 tablet  6  . beta carotene w/minerals (OCUVITE) tablet TAKE ONE TABLET BY MOUTH EVERY DAY.  30 tablet  11  . Calcium Carbonate (CALCIUM 500 PO) Take by mouth.      Marland Kitchen CLARITIN 10 MG tablet TAKE ONE TABLET BY MOUTH EVERY DAY.  30 tablet  11  . Fluticasone-Salmeterol (ADVAIR DISKUS) 100-50 MCG/DOSE AEPB Inhale 1 puff into the lungs every 12 (twelve) hours.  60 each  11  . HYDROcodone-acetaminophen (NORCO/VICODIN) 5-325 MG per tablet Take 1 tablet by mouth every 6 (six) hours as needed for pain.  60 tablet  2  . hydrOXYzine (ATARAX/VISTARIL) 25 MG tablet Take 1 tablet (25 mg total)  by mouth 3 (three) times daily.  90 tablet  3  . insulin glargine (LANTUS SOLOSTAR) 100 UNIT/ML injection Inject 0.15 mLs (15 Units total) into the skin at bedtime.  5 pen  PRN  . Insulin Pen Needle 31G X 8 MM MISC 1 Syringe by Does not apply route daily.  30 each  0  . LEXAPRO 20 MG tablet TAKE (1) TABLET BY MOUTH DAILY.  30 tablet  5  . losartan-hydrochlorothiazide (HYZAAR) 50-12.5 MG per tablet Take 1 tablet by mouth daily with breakfast.  90 tablet  3  . Multiple Vitamin (MULTIVITAMIN) tablet Take 1 tablet by mouth daily.      . Omega-3 Fatty Acids (FISH OIL) 1000 MG CAPS TAKE ONE CAPSULE BY MOUTH  EVERY DAY.  30 capsule  11  . omeprazole (PRILOSEC) 40 MG capsule Take 1 capsule (40 mg total) by mouth every morning.  30 capsule  5  . QUEtiapine (SEROQUEL) 50 MG tablet Take 1 tablet (50 mg total) by mouth 2 (two) times daily. i n the morning and evening  60 tablet  11  . sitaGLIPtin (JANUVIA) 100 MG tablet Take 1 tablet (100 mg total) by mouth daily.  30 tablet  2  . zolpidem (AMBIEN) 5 MG tablet TAKE 1 TABLET AT BEDTIME AS NEEDED  30 tablet  2  . [DISCONTINUED] QUEtiapine (SEROQUEL) 50 MG tablet Take 1 tablet (50 mg total) by mouth at bedtime.  30 tablet  11  . ergocalciferol (DRISDOL) 50000 UNITS capsule Take 1 capsule (50,000 Units total) by mouth once a week.  4 capsule  2   No facility-administered encounter medications on file as of 12/27/2012.     Orders Placed This Encounter  Procedures  . DME Other see comment    Return in about 4 weeks (around 01/24/2013).

## 2012-12-28 ENCOUNTER — Encounter: Payer: Self-pay | Admitting: Internal Medicine

## 2012-12-28 NOTE — Assessment & Plan Note (Signed)
Discussed increasing the hydroxyzine to 50 mg as needed.

## 2012-12-28 NOTE — Assessment & Plan Note (Addendum)
She is tolerating lexapro and has resumed seroquel with no significant improvement per family.  They are not interested in psychiatric referral.  Will increase seroquel to 50 mg twice daily

## 2012-12-28 NOTE — Assessment & Plan Note (Addendum)
continue ambien 5 mg but take at 8 pm. ,  Increase hydroxyzine to 50 mg and seroquel to 50 mg bid.

## 2012-12-28 NOTE — Assessment & Plan Note (Addendum)
With recent ankle fracture sustained during syncopal episode, requiring rehab and brace without surgical intervention,  Improved balance with PT>

## 2012-12-30 ENCOUNTER — Other Ambulatory Visit: Payer: Self-pay | Admitting: Internal Medicine

## 2013-01-11 ENCOUNTER — Other Ambulatory Visit: Payer: Self-pay | Admitting: Internal Medicine

## 2013-01-11 ENCOUNTER — Encounter: Payer: Self-pay | Admitting: Internal Medicine

## 2013-01-11 DIAGNOSIS — M1612 Unilateral primary osteoarthritis, left hip: Secondary | ICD-10-CM | POA: Insufficient documentation

## 2013-01-12 NOTE — Telephone Encounter (Signed)
Please Advise

## 2013-01-13 ENCOUNTER — Other Ambulatory Visit: Payer: Self-pay | Admitting: Internal Medicine

## 2013-01-13 NOTE — Telephone Encounter (Signed)
Script faxed.

## 2013-01-13 NOTE — Telephone Encounter (Signed)
Please Advise

## 2013-01-19 ENCOUNTER — Telehealth: Payer: Self-pay | Admitting: Internal Medicine

## 2013-01-19 NOTE — Telephone Encounter (Signed)
sitaGLIPtin (JANUVIA) 100 MG tablet  The patient is needing a refill called into the pharmacy she is completely out of medication.

## 2013-01-20 ENCOUNTER — Other Ambulatory Visit: Payer: Self-pay | Admitting: Internal Medicine

## 2013-01-20 ENCOUNTER — Other Ambulatory Visit: Payer: Self-pay

## 2013-01-20 ENCOUNTER — Telehealth: Payer: Self-pay | Admitting: *Deleted

## 2013-01-20 DIAGNOSIS — E1169 Type 2 diabetes mellitus with other specified complication: Secondary | ICD-10-CM

## 2013-01-20 MED ORDER — SITAGLIPTIN PHOSPHATE 100 MG PO TABS: 100.0000 mg | ORAL_TABLET | Freq: Every day | ORAL | Status: DC

## 2013-01-20 NOTE — Telephone Encounter (Signed)
Refill Request  Loratadine 10 mg  #30   Take one tablet daily

## 2013-01-20 NOTE — Telephone Encounter (Signed)
Sent rx to pt pharmacy  

## 2013-01-23 ENCOUNTER — Telehealth: Payer: Self-pay | Admitting: *Deleted

## 2013-01-23 NOTE — Telephone Encounter (Signed)
Refill Request  Loratadine 10 mg tablet  #30   Take one tablet daily

## 2013-01-24 MED ORDER — LORATADINE 10 MG PO TABS: ORAL_TABLET | ORAL | Status: DC

## 2013-01-24 NOTE — Telephone Encounter (Signed)
Refill sent.

## 2013-02-12 ENCOUNTER — Ambulatory Visit: Payer: Self-pay | Admitting: Orthopedic Surgery

## 2013-02-12 ENCOUNTER — Inpatient Hospital Stay: Payer: Self-pay | Admitting: Orthopedic Surgery

## 2013-02-12 LAB — PROTIME-INR: Prothrombin Time: 12.7 secs (ref 11.5–14.7)

## 2013-02-12 LAB — URINALYSIS, COMPLETE
Bilirubin,UR: NEGATIVE
Blood: NEGATIVE
Glucose,UR: NEGATIVE mg/dL (ref 0–75)
Hyaline Cast: 3
Ketone: NEGATIVE
Nitrite: POSITIVE
Ph: 5 (ref 4.5–8.0)
Protein: NEGATIVE
RBC,UR: 3 /HPF (ref 0–5)
WBC UR: 215 /HPF (ref 0–5)

## 2013-02-12 LAB — CBC WITH DIFFERENTIAL/PLATELET
Basophil #: 0.1 10*3/uL (ref 0.0–0.1)
Basophil %: 0.4 %
Eosinophil #: 0.1 10*3/uL (ref 0.0–0.7)
HGB: 11.1 g/dL — ABNORMAL LOW (ref 12.0–16.0)
Lymphocyte %: 7 %
MCH: 28.8 pg (ref 26.0–34.0)
Monocyte #: 0.9 x10 3/mm (ref 0.2–0.9)
Monocyte %: 6.5 %
Neutrophil #: 11.5 10*3/uL — ABNORMAL HIGH (ref 1.4–6.5)
Neutrophil %: 85.4 %
RDW: 14.8 % — ABNORMAL HIGH (ref 11.5–14.5)
WBC: 13.5 10*3/uL — ABNORMAL HIGH (ref 3.6–11.0)

## 2013-02-12 LAB — BASIC METABOLIC PANEL
Anion Gap: 7 (ref 7–16)
Calcium, Total: 8.1 mg/dL — ABNORMAL LOW (ref 8.5–10.1)
Chloride: 105 mmol/L (ref 98–107)
Co2: 29 mmol/L (ref 21–32)
EGFR (Non-African Amer.): 28 — ABNORMAL LOW
Potassium: 4 mmol/L (ref 3.5–5.1)

## 2013-02-13 LAB — BASIC METABOLIC PANEL
Anion Gap: 4 — ABNORMAL LOW (ref 7–16)
Chloride: 106 mmol/L (ref 98–107)
Co2: 29 mmol/L (ref 21–32)
EGFR (Non-African Amer.): 24 — ABNORMAL LOW
Glucose: 145 mg/dL — ABNORMAL HIGH (ref 65–99)
Sodium: 139 mmol/L (ref 136–145)

## 2013-02-13 LAB — CBC WITH DIFFERENTIAL/PLATELET
Basophil #: 0 10*3/uL (ref 0.0–0.1)
Eosinophil #: 0 10*3/uL (ref 0.0–0.7)
Eosinophil %: 0.4 %
HCT: 28.4 % — ABNORMAL LOW (ref 35.0–47.0)
HGB: 9.4 g/dL — ABNORMAL LOW (ref 12.0–16.0)
MCH: 29.7 pg (ref 26.0–34.0)
MCV: 90 fL (ref 80–100)
Monocyte %: 15.1 %
Neutrophil %: 73.3 %
RBC: 3.16 10*6/uL — ABNORMAL LOW (ref 3.80–5.20)
WBC: 11.9 10*3/uL — ABNORMAL HIGH (ref 3.6–11.0)

## 2013-02-14 LAB — CBC WITH DIFFERENTIAL/PLATELET
Basophil %: 0.5 %
HCT: 24.8 % — ABNORMAL LOW (ref 35.0–47.0)
HGB: 8.3 g/dL — ABNORMAL LOW (ref 12.0–16.0)
Lymphocyte %: 15.6 %
MCH: 30.1 pg (ref 26.0–34.0)
MCV: 90 fL (ref 80–100)
Monocyte #: 1.6 x10 3/mm — ABNORMAL HIGH (ref 0.2–0.9)
Neutrophil %: 65.6 %
Platelet: 137 10*3/uL — ABNORMAL LOW (ref 150–440)
WBC: 10.4 10*3/uL (ref 3.6–11.0)

## 2013-02-14 LAB — BASIC METABOLIC PANEL
Anion Gap: 6 — ABNORMAL LOW (ref 7–16)
Calcium, Total: 8.1 mg/dL — ABNORMAL LOW (ref 8.5–10.1)
Co2: 28 mmol/L (ref 21–32)
Creatinine: 1.77 mg/dL — ABNORMAL HIGH (ref 0.60–1.30)
EGFR (African American): 29 — ABNORMAL LOW
Osmolality: 282 (ref 275–301)

## 2013-02-16 LAB — URINE CULTURE

## 2013-02-23 ENCOUNTER — Other Ambulatory Visit: Payer: Self-pay | Admitting: Internal Medicine

## 2013-02-27 ENCOUNTER — Ambulatory Visit: Payer: 59 | Admitting: Internal Medicine

## 2013-03-10 ENCOUNTER — Other Ambulatory Visit: Payer: Self-pay | Admitting: *Deleted

## 2013-03-11 MED ORDER — HYDROCODONE-ACETAMINOPHEN 5-325 MG PO TABS: ORAL_TABLET | ORAL | Status: DC

## 2013-03-11 MED ORDER — ZOLPIDEM TARTRATE 5 MG PO TABS: ORAL_TABLET | ORAL | Status: DC

## 2013-03-28 ENCOUNTER — Ambulatory Visit: Payer: 59 | Admitting: Internal Medicine

## 2013-05-05 ENCOUNTER — Telehealth: Payer: Self-pay | Admitting: Internal Medicine

## 2013-05-05 NOTE — Telephone Encounter (Signed)
Hospital follow up 10.3.14

## 2013-05-06 NOTE — Telephone Encounter (Signed)
Thanks, please fax over request for ER records

## 2013-05-10 ENCOUNTER — Telehealth: Payer: Self-pay | Admitting: Internal Medicine

## 2013-05-10 NOTE — Telephone Encounter (Signed)
Yes we are aware of the potentional drug interaction,.  continue current meds  Verbal auth to continue home  PT 2/week given

## 2013-05-10 NOTE — Telephone Encounter (Signed)
Also need verbal for physical therapy 2 times weekly please advise.

## 2013-05-10 NOTE — Telephone Encounter (Signed)
Calling to inform of drug interaction on pt medication list.  Quetiapine and escitalopram have drug interaction.  States Dr. Darrick Huntsman may be aware of the interaction and benefits may outweigh risks, but they need confirmation that we are aware that there is an interaction between the two drugs.  Please contact [facility]

## 2013-05-11 ENCOUNTER — Encounter: Payer: Self-pay | Admitting: *Deleted

## 2013-05-11 ENCOUNTER — Other Ambulatory Visit: Payer: Self-pay | Admitting: Internal Medicine

## 2013-05-12 ENCOUNTER — Ambulatory Visit (INDEPENDENT_AMBULATORY_CARE_PROVIDER_SITE_OTHER): Payer: Medicare Other | Admitting: Internal Medicine

## 2013-05-12 VITALS — BP 116/58 | HR 92 | Temp 97.6°F | Resp 14 | Wt 228.0 lb

## 2013-05-12 DIAGNOSIS — E785 Hyperlipidemia, unspecified: Secondary | ICD-10-CM

## 2013-05-12 DIAGNOSIS — IMO0002 Reserved for concepts with insufficient information to code with codable children: Secondary | ICD-10-CM

## 2013-05-12 DIAGNOSIS — E669 Obesity, unspecified: Secondary | ICD-10-CM

## 2013-05-12 DIAGNOSIS — E119 Type 2 diabetes mellitus without complications: Secondary | ICD-10-CM

## 2013-05-12 DIAGNOSIS — K5900 Constipation, unspecified: Secondary | ICD-10-CM

## 2013-05-12 DIAGNOSIS — F323 Major depressive disorder, single episode, severe with psychotic features: Secondary | ICD-10-CM

## 2013-05-12 DIAGNOSIS — M6281 Muscle weakness (generalized): Secondary | ICD-10-CM

## 2013-05-12 DIAGNOSIS — D649 Anemia, unspecified: Secondary | ICD-10-CM

## 2013-05-12 DIAGNOSIS — R2689 Other abnormalities of gait and mobility: Secondary | ICD-10-CM

## 2013-05-12 DIAGNOSIS — G4700 Insomnia, unspecified: Secondary | ICD-10-CM

## 2013-05-12 DIAGNOSIS — Z23 Encounter for immunization: Secondary | ICD-10-CM

## 2013-05-12 DIAGNOSIS — S82892K Other fracture of left lower leg, subsequent encounter for closed fracture with nonunion: Secondary | ICD-10-CM

## 2013-05-12 DIAGNOSIS — E1169 Type 2 diabetes mellitus with other specified complication: Secondary | ICD-10-CM

## 2013-05-12 DIAGNOSIS — R29818 Other symptoms and signs involving the nervous system: Secondary | ICD-10-CM

## 2013-05-12 LAB — COMPREHENSIVE METABOLIC PANEL
Alkaline Phosphatase: 86 U/L (ref 39–117)
BUN: 26 mg/dL — ABNORMAL HIGH (ref 6–23)
Glucose, Bld: 86 mg/dL (ref 70–99)
Sodium: 137 mEq/L (ref 135–145)
Total Bilirubin: 0.3 mg/dL (ref 0.3–1.2)
Total Protein: 6.8 g/dL (ref 6.0–8.3)

## 2013-05-12 LAB — HEMOGLOBIN A1C
Hgb A1c MFr Bld: 6.6 % — ABNORMAL HIGH (ref <5.7)
Mean Plasma Glucose: 143 mg/dL — ABNORMAL HIGH (ref <117)

## 2013-05-12 MED ORDER — POLYETHYLENE GLYCOL 3350 17 GM/SCOOP PO POWD: 17.0000 g | Freq: Every day | ORAL | Status: DC

## 2013-05-12 MED ORDER — DOCUSATE SODIUM 100 MG PO CAPS: 100.0000 mg | ORAL_CAPSULE | Freq: Two times a day (BID) | ORAL | Status: DC

## 2013-05-12 NOTE — Patient Instructions (Addendum)
Need to log every BM  Get a calendar for BM's   For DM:  Increase lantus to 18 units daily   Check BS at different times twice daily   Pre/post breakfast  ( w hr post)    Pre/ post lunch   Pre/ post dinner   Send  Sugars to me weekly  Via e mail/[facility]  Can substitute dipenhydramine for hydroxyzine   Up to 50 mg at a time   Start using miralax once daily and stool softener twice daily (sent to pharamcy)  Return in one month

## 2013-05-12 NOTE — Progress Notes (Signed)
Patient ID: Sheena [name], female   DOB: 1924/03/24, 79 y.o.   MRN: [id number]  Patient Active Problem List   Diagnosis Date Noted  . Other and unspecified hyperlipidemia 05/14/2013  . Osteoarthritis of left hip 01/11/2013  . History of syncope 11/15/2012  . Ankle fracture, left 11/15/2012  . H/O acute renal failure 11/15/2012  . Proximal muscle weakness 10/30/2012  . Neurotic excoriations 09/02/2012  . Loss of balance 06/06/2012  . Melanoma   . Asthma   . Insomnia, persistent 09/01/2011  . Severe major depression with psychotic features 09/01/2011  . Obesity (BMI 30-39.9) 09/01/2011  . Type II or unspecified type diabetes mellitus with renal manifestations, not stated as uncontrolled(250.40) 09/01/2011    Subjective:  CC:   Chief Complaint  Patient presents with  . Follow-up    hospital    HPI:   Sheena [name] a 79 y.o. female who presents Follow up on multiple medical issues.   She was hospitalized at [facility] on July 6 for left distal tib/ fib fracturewhich occurred at home  and transferred to [facility] care from in late July for rehab until late September  Following reconstructive ankle surgery  Requiring 27 screws,  2 plates.  Etc ,  Apparently had an episode of asystole while in the ER and apparently a Code Blue was called.  Patient was previously  DNR but is now FULL CODE    Had a lot of fecal incontinence while there due to overuse of suppositories.  Had an episode of severe constipation requiring manual disimpaction while there. Family reports that she has not had a bowel movement since coming home on the 26th.  She now has 3 caregivers providing 24 hour supervision., one from 8 am  to noon,  One from 12 am to 8 pm,  Another from 8 pm to 8 am.Daughter Sheena [name] is still living in her house and overseeing things.  They are not charting her bowel movements. She does not ambulate to the bathroom unassisted since her fall and subsequent ankle fracture.   DM: her  daughter is concerned that the  Insulin regimen used while in rehab needs adjustment.  There have been no hypoglyceminc episodes reported since being home,  Her appetite is good.,  The caregivers are checking blood sugars   2 hr post prandially and are 200 to 250  And her  fastings are alsoaveraging  200 .   Family is giving her Lantus once daily     Past Medical History  Diagnosis Date  . H/O: hysterectomy 1973    fibroids  . Lumbago   . Left arm pain   . Diabetes mellitus, type 2   . Melanoma     left arm, resected, annual CXR's ordered  . Asthma     treated by Dr. [place] Callas  . Depression     Past Surgical History  Procedure Laterality Date  . Total hip arthroplasty  2003    left, Dr. Cleophas Dunker  . Breast biopsy  1985    normal  . Joint replacement   2003    Left Hip  . Abdominal hysterectomy  1973    fibroid uterus       The following portions of the patient's history were reviewed and updated as appropriate: Allergies, current medications, and problem list.    Review of Systems:   12 Pt  review of systems was negative except those addressed in the HPI,     History   Social History  .  Marital Status: Married    Spouse Name: N/A    Number of Children: N/A  . Years of Education: N/A   Occupational History  . Not on file.   Social History Main Topics  . Smoking status: Never Smoker   . Smokeless tobacco: Never Used  . Alcohol Use: No     Comment: occasional  . Drug Use: No  . Sexual Activity: No   Other Topics Concern  . Not on file   Social History Narrative   Widowed 2013 after 60+ years of marriage.   One level house,  2 daytime caregivers and daughter livers with her temporarily     Objective:  Filed Vitals:   05/12/13 1410  BP: 116/58  Pulse: 92  Temp: 97.6 F (36.4 C)  Resp: 14     General appearance: alert, cooperative and appears stated age Ears: normal TM's and external ear canals both ears Throat: lips, mucosa, and tongue  normal; teeth and gums normal Neck: no adenopathy, no carotid bruit, supple, symmetrical, trachea midline and thyroid not enlarged, symmetric, no tenderness/mass/nodules Back: symmetric, no curvature. ROM normal. No CVA tenderness. Lungs: clear to auscultation bilaterally Heart: regular rate and rhythm, S1, S2 normal, no murmur, click, rub or gallop Abdomen: soft, non-tender; bowel sounds normal; no masses,  no organomegaly Pulses: 2+ and symmetric Skin: Skin color, texture, turgor normal. No rashes or lesions Lymph nodes: Cervical, supraclavicular, and axillary nodes normal.  Assessment and Plan:  Ankle fracture, left T  Recovery was slow due to nonweightbearing and she was transferred to SNF .  She was release from [facility] on Sept 26th.   Type II or unspecified type diabetes mellitus with renal manifestations, not stated as uncontrolled(250.40) Well-controlled on current medications.  hemoglobin A1c has been consistently less than 7.0 . Will raise the Lantus to 18 units from 15 .  Based on family and caregivers recent blood sugars reported since discharge from [facility] being elevated.  Se is up-to-date on eye exams and his foot exam is normal. She is on the appropriate medications. Renal funciton is stable,  Metformin has been discontinued.   Loss of balance With recent left tib fib fracture.  She requires a walker and constant supervision .   Other and unspecified hyperlipidemia Well controlled on current regimen.liver enzymes normal, no changes today.  Severe major depression with psychotic features No changes were made today to patient's regimen,   Unspecified constipation Recommended keeping a stool chart. Resume daily miralax and daily stool softener  Insomnia, persistent Continue seroquel and ambien.  Without these medications patient does not sleep and wanders.   A total of 40 minutes was spent with patient more than half of which was spent in counseling,  reviewing records from other prviders and coordination of care. Updated Medication List Outpatient Encounter Prescriptions as of 05/12/2013  Medication Sig Dispense Refill  . aspirin (QC LO-DOSE ASPIRIN) 81 MG EC tablet Take 1 tablet (81 mg total) by mouth daily with lunch. Swallow whole.  30 tablet  11  . aspirin EC 81 MG tablet TAKE 1 TABLET BY MOUTH EVERY DAY  30 tablet  11  . atorvastatin (LIPITOR) 40 MG tablet TAKE ONE TABLET BY MOUTH EVERY DAY.  30 tablet  6  . beta carotene w/minerals (OCUVITE) tablet TAKE ONE TABLET BY MOUTH EVERY DAY.  30 tablet  11  . Calcium Carbonate (CALCIUM 500 PO) Take by mouth.      . ergocalciferol (DRISDOL) 50000 UNITS capsule Take  1 capsule (50,000 Units total) by mouth once a week.  4 capsule  2  . escitalopram (LEXAPRO) 20 MG tablet TAKE 1 TABLET (20 MG TOTAL) BY MOUTH DAILY.  30 tablet  2  . Fluticasone-Salmeterol (ADVAIR DISKUS) 100-50 MCG/DOSE AEPB Inhale 1 puff into the lungs every 12 (twelve) hours.  60 each  11  . HYDROcodone-acetaminophen (NORCO/VICODIN) 5-325 MG per tablet TAKE 1 TABLET BY MOUTH TWICE DAILY  AS NEEDED FOR PAIN  60 tablet  2  . hydrOXYzine (ATARAX/VISTARIL) 25 MG tablet Take 1 tablet (25 mg total) by mouth 3 (three) times daily.  90 tablet  3  . hydrOXYzine (ATARAX/VISTARIL) 25 MG tablet Take 2 tablets (50 mg total) by mouth every 8 (eight) hours as needed for itching.  180 tablet  3  . insulin glargine (LANTUS) 100 UNIT/ML injection Inject 0.16 mLs (16 Units total) into the skin at bedtime.  5 pen  PRN  . Insulin Pen Needle 31G X 8 MM MISC 1 Syringe by Does not apply route daily.  30 each  0  . loratadine (CLARITIN) 10 MG tablet TAKE ONE TABLET BY MOUTH EVERY DAY.  30 tablet  5  . losartan-hydrochlorothiazide (HYZAAR) 50-12.5 MG per tablet Take 1 tablet by mouth daily with breakfast.  90 tablet  3  . Multiple Vitamin (MULTIVITAMIN) tablet Take 1 tablet by mouth daily.      . Omega-3 Fatty Acids (FISH OIL) 1000 MG CAPS TAKE ONE CAPSULE  BY MOUTH EVERY DAY.  30 capsule  11  . omeprazole (PRILOSEC) 40 MG capsule TAKE 1 CAPSULE BY MOUTH DAILY.  30 capsule  5  . QUEtiapine (SEROQUEL) 50 MG tablet Take 1 tablet (50 mg total) by mouth 2 (two) times daily. i n the morning and evening  60 tablet  11  . sitaGLIPtin (JANUVIA) 100 MG tablet Take 1 tablet (100 mg total) by mouth daily.  30 tablet  5  . zolpidem (AMBIEN) 5 MG tablet TAKE 1 TABLET AT BEDTIME AS NEEDED FOR INSOMNIA  30 tablet  2  . [DISCONTINUED] insulin glargine (LANTUS SOLOSTAR) 100 UNIT/ML injection Inject 0.15 mLs (15 Units total) into the skin at bedtime.  5 pen  PRN  . docusate sodium (COLACE) 100 MG capsule Take 1 capsule (100 mg total) by mouth 2 (two) times daily.  180 capsule  3  . polyethylene glycol powder (GLYCOLAX/MIRALAX) powder Take 17 g by mouth daily.  3350 g  1  . [DISCONTINUED] LEXAPRO 20 MG tablet TAKE (1) TABLET BY MOUTH DAILY.  30 tablet  5   No facility-administered encounter medications on file as of 05/12/2013.      Orders Placed This Encounter  Procedures  . Flu Vaccine QUAD 36+ mos PF IM (Fluarix)  . CBC with Differential  . Comprehensive metabolic panel  . TSH  . Hemoglobin A1c  . HM DIABETES FOOT EXAM    Return in about 4 weeks (around 06/09/2013).

## 2013-05-12 NOTE — Telephone Encounter (Signed)
Left message for [facility] to call office for verbal.

## 2013-05-14 ENCOUNTER — Encounter: Payer: Self-pay | Admitting: Internal Medicine

## 2013-05-14 DIAGNOSIS — K5900 Constipation, unspecified: Secondary | ICD-10-CM | POA: Insufficient documentation

## 2013-05-14 DIAGNOSIS — E785 Hyperlipidemia, unspecified: Secondary | ICD-10-CM | POA: Insufficient documentation

## 2013-05-14 MED ORDER — INSULIN GLARGINE 100 UNIT/ML ~~LOC~~ SOLN: 16.0000 [IU] | Freq: Every day | SUBCUTANEOUS | Status: DC

## 2013-05-14 NOTE — Assessment & Plan Note (Signed)
Well controlled on current regimen.liver enzymes normal, no changes today.

## 2013-05-14 NOTE — Assessment & Plan Note (Signed)
Recommended keeping a stool chart. Resume daily miralax and daily stool softener

## 2013-05-14 NOTE — Assessment & Plan Note (Signed)
With recent left tib fib fracture.  She requires a walker and constant supervision .

## 2013-05-14 NOTE — Assessment & Plan Note (Addendum)
Continue seroquel and ambien.  Without these medications patient does not sleep and wanders.

## 2013-05-14 NOTE — Assessment & Plan Note (Addendum)
Well-controlled on current medications.  hemoglobin A1c has been consistently less than 7.0 . Will raise the Lantus to 18 units from 15 .  Based on family and caregivers recent blood sugars reported since discharge from [facility] being elevated.  Se is up-to-date on eye exams and his foot exam is normal. She is on the appropriate medications. Renal funciton is stable,  Metformin has been discontinued.    

## 2013-05-14 NOTE — Assessment & Plan Note (Addendum)
T  Recovery was slow due to nonweightbearing and she was transferred to SNF .  She was release from [facility] on Sept 26th.

## 2013-05-14 NOTE — Assessment & Plan Note (Signed)
No changes were made today to patient's regimen,

## 2013-05-16 ENCOUNTER — Encounter: Payer: Self-pay | Admitting: *Deleted

## 2013-06-05 ENCOUNTER — Ambulatory Visit (INDEPENDENT_AMBULATORY_CARE_PROVIDER_SITE_OTHER): Payer: 59 | Admitting: Adult Health

## 2013-06-05 ENCOUNTER — Encounter: Payer: Self-pay | Admitting: Adult Health

## 2013-06-05 VITALS — BP 110/60 | HR 88 | Temp 97.3°F | Resp 14 | Wt 247.0 lb

## 2013-06-05 DIAGNOSIS — R300 Dysuria: Secondary | ICD-10-CM

## 2013-06-05 DIAGNOSIS — F05 Delirium due to known physiological condition: Secondary | ICD-10-CM

## 2013-06-05 DIAGNOSIS — N390 Urinary tract infection, site not specified: Secondary | ICD-10-CM | POA: Insufficient documentation

## 2013-06-05 LAB — POCT URINALYSIS DIPSTICK
Bilirubin, UA: NEGATIVE
Blood, UA: NEGATIVE
Glucose, UA: NEGATIVE
Ketones, UA: NEGATIVE
Nitrite, UA: POSITIVE
Protein, UA: NEGATIVE
Spec Grav, UA: 1.02
Urobilinogen, UA: 0.2
pH, UA: 5

## 2013-06-05 MED ORDER — CIPROFLOXACIN HCL 250 MG PO TABS: 250.0000 mg | ORAL_TABLET | Freq: Two times a day (BID) | ORAL | Status: DC

## 2013-06-05 NOTE — Progress Notes (Signed)
Subjective:    Patient ID: Sheena [name], female    DOB: 26-Jan-1924, 79 y.o.   MRN: [id number]  HPI  Patient presents with symptoms of possible UTI and confusion. She has had symptoms for approximately 3 weeks. No dysuria but had frequency and foul odor of urine. Low grade temp on Friday of 99. She has not taken any over-the-counter medications such as Pyridium. She presents to clinic with 2 daughters.   Current Outpatient Prescriptions on File Prior to Visit  Medication Sig Dispense Refill  . aspirin (QC LO-DOSE ASPIRIN) 81 MG EC tablet Take 1 tablet (81 mg total) by mouth daily with lunch. Swallow whole.  30 tablet  11  . atorvastatin (LIPITOR) 40 MG tablet TAKE ONE TABLET BY MOUTH EVERY DAY.  30 tablet  6  . beta carotene w/minerals (OCUVITE) tablet TAKE ONE TABLET BY MOUTH EVERY DAY.  30 tablet  11  . Calcium Carbonate (CALCIUM 500 PO) Take by mouth.      . docusate sodium (COLACE) 100 MG capsule Take 1 capsule (100 mg total) by mouth 2 (two) times daily.  180 capsule  3  . ergocalciferol (DRISDOL) 50000 UNITS capsule Take 1 capsule (50,000 Units total) by mouth once a week.  4 capsule  2  . escitalopram (LEXAPRO) 20 MG tablet TAKE 1 TABLET (20 MG TOTAL) BY MOUTH DAILY.  30 tablet  2  . Fluticasone-Salmeterol (ADVAIR DISKUS) 100-50 MCG/DOSE AEPB Inhale 1 puff into the lungs every 12 (twelve) hours.  60 each  11  . HYDROcodone-acetaminophen (NORCO/VICODIN) 5-325 MG per tablet TAKE 1 TABLET BY MOUTH TWICE DAILY  AS NEEDED FOR PAIN  60 tablet  2  . hydrOXYzine (ATARAX/VISTARIL) 25 MG tablet Take 1 tablet (25 mg total) by mouth 3 (three) times daily.  90 tablet  3  . insulin glargine (LANTUS) 100 UNIT/ML injection Inject 0.16 mLs (16 Units total) into the skin at bedtime.  5 pen  PRN  . Insulin Pen Needle 31G X 8 MM MISC 1 Syringe by Does not apply route daily.  30 each  0  . loratadine (CLARITIN) 10 MG tablet TAKE ONE TABLET BY MOUTH EVERY DAY.  30 tablet  5  .  losartan-hydrochlorothiazide (HYZAAR) 50-12.5 MG per tablet Take 1 tablet by mouth daily with breakfast.  90 tablet  3  . Multiple Vitamin (MULTIVITAMIN) tablet Take 1 tablet by mouth daily.      . Omega-3 Fatty Acids (FISH OIL) 1000 MG CAPS TAKE ONE CAPSULE BY MOUTH EVERY DAY.  30 capsule  11  . omeprazole (PRILOSEC) 40 MG capsule TAKE 1 CAPSULE BY MOUTH DAILY.  30 capsule  5  . polyethylene glycol powder (GLYCOLAX/MIRALAX) powder Take 17 g by mouth daily.  3350 g  1  . QUEtiapine (SEROQUEL) 50 MG tablet Take 1 tablet (50 mg total) by mouth 2 (two) times daily. i n the morning and evening  60 tablet  11  . sitaGLIPtin (JANUVIA) 100 MG tablet Take 1 tablet (100 mg total) by mouth daily.  30 tablet  5  . zolpidem (AMBIEN) 5 MG tablet TAKE 1 TABLET AT BEDTIME AS NEEDED FOR INSOMNIA  30 tablet  2   No current facility-administered medications on file prior to visit.      Review of Systems  Constitutional: Positive for fever. Negative for chills.  Genitourinary: Positive for frequency. Negative for dysuria, hematuria and pelvic pain.  Psychiatric/Behavioral: Positive for confusion.       Objective:   Physical Exam  Constitutional: No distress.  Pleasant 79 year old female  Pulmonary/Chest: Effort normal. No respiratory distress.  Genitourinary:  No suprapubic tenderness  Neurological: She is alert.  Psychiatric: She has a normal mood and affect. Her behavior is normal. Judgment and thought content normal.          Assessment & Plan:

## 2013-06-05 NOTE — Patient Instructions (Signed)
  Start Cipro 250 mg twice a day for 5 days. Return to clinic for urine recheck to see if UTI cleared at least 3 days after completing antibiotic.  Urinary Tract Infection Urinary tract infections (UTIs) can develop anywhere along your urinary tract. Your urinary tract is your body's drainage system for removing wastes and extra water. Your urinary tract includes two kidneys, two ureters, a bladder, and a urethra. Your kidneys are a pair of bean-shaped organs. Each kidney is about the size of your fist. They are located below your ribs, one on each side of your spine. CAUSES Infections are caused by microbes, which are microscopic organisms, including fungi, viruses, and bacteria. These organisms are so small that they can only be seen through a microscope. Bacteria are the microbes that most commonly cause UTIs. SYMPTOMS  Symptoms of UTIs may vary by age and gender of the patient and by the location of the infection. Symptoms in young women typically include a frequent and intense urge to urinate and a painful, burning feeling in the bladder or urethra during urination. Older women and men are more likely to be tired, shaky, and weak and have muscle aches and abdominal pain. A fever may mean the infection is in your kidneys. Other symptoms of a kidney infection include pain in your back or sides below the ribs, nausea, and vomiting. DIAGNOSIS To diagnose a UTI, your caregiver will ask you about your symptoms. Your caregiver also will ask to provide a urine sample. The urine sample will be tested for bacteria and white blood cells. White blood cells are made by your body to help fight infection. TREATMENT  Typically, UTIs can be treated with medication. Because most UTIs are caused by a bacterial infection, they usually can be treated with the use of antibiotics. The choice of antibiotic and length of treatment depend on your symptoms and the type of bacteria causing your infection. HOME CARE  INSTRUCTIONS  If you were prescribed antibiotics, take them exactly as your caregiver instructs you. Finish the medication even if you feel better after you have only taken some of the medication.  Drink enough water and fluids to keep your urine clear or pale yellow.  Avoid caffeine, tea, and carbonated beverages. They tend to irritate your bladder.  Empty your bladder often. Avoid holding urine for long periods of time.  Empty your bladder before and after sexual intercourse.  After a bowel movement, women should cleanse from front to back. Use each tissue only once. SEEK MEDICAL CARE IF:   You have back pain.  You develop a fever.  Your symptoms do not begin to resolve within 3 days. SEEK IMMEDIATE MEDICAL CARE IF:   You have severe back pain or lower abdominal pain.  You develop chills.  You have nausea or vomiting.  You have continued burning or discomfort with urination. MAKE SURE YOU:   Understand these instructions.  Will watch your condition.  Will get help right away if you are not doing well or get worse. Document Released: 05/06/2005 Document Revised: 01/26/2012 Document Reviewed: 09/04/2011 [facility] Patient Information 2014 [place], [place].

## 2013-06-05 NOTE — Assessment & Plan Note (Addendum)
UA dipstick shows large amounts of leukocytes and positive nitrites. Start Cipro 250 mg twice a day x5 days. Return to clinic for recheck 3-4 days after completing antibiotics.

## 2013-06-06 ENCOUNTER — Other Ambulatory Visit: Payer: Self-pay | Admitting: Internal Medicine

## 2013-06-07 LAB — URINE CULTURE

## 2013-06-08 ENCOUNTER — Other Ambulatory Visit: Payer: Self-pay | Admitting: Adult Health

## 2013-06-08 MED ORDER — SULFAMETHOXAZOLE-TMP DS 800-160 MG PO TABS: 1.0000 | ORAL_TABLET | Freq: Two times a day (BID) | ORAL | Status: DC

## 2013-06-08 NOTE — Progress Notes (Signed)
Urine culture shows resistance to Cipro. Change to Bactrim bid x 7 days.

## 2013-06-12 ENCOUNTER — Encounter: Payer: Self-pay | Admitting: *Deleted

## 2013-06-13 ENCOUNTER — Encounter: Payer: Self-pay | Admitting: Internal Medicine

## 2013-06-13 ENCOUNTER — Telehealth: Payer: Self-pay | Admitting: *Deleted

## 2013-06-13 ENCOUNTER — Ambulatory Visit (INDEPENDENT_AMBULATORY_CARE_PROVIDER_SITE_OTHER): Payer: 59 | Admitting: Internal Medicine

## 2013-06-13 VITALS — BP 118/60 | HR 98 | Temp 97.5°F | Resp 16 | Wt 245.5 lb

## 2013-06-13 DIAGNOSIS — Z87898 Personal history of other specified conditions: Secondary | ICD-10-CM

## 2013-06-13 DIAGNOSIS — M6281 Muscle weakness (generalized): Secondary | ICD-10-CM

## 2013-06-13 DIAGNOSIS — E669 Obesity, unspecified: Secondary | ICD-10-CM

## 2013-06-13 DIAGNOSIS — Z9189 Other specified personal risk factors, not elsewhere classified: Secondary | ICD-10-CM

## 2013-06-13 DIAGNOSIS — G3184 Mild cognitive impairment, so stated: Secondary | ICD-10-CM

## 2013-06-13 DIAGNOSIS — M169 Osteoarthritis of hip, unspecified: Secondary | ICD-10-CM

## 2013-06-13 DIAGNOSIS — M1612 Unilateral primary osteoarthritis, left hip: Secondary | ICD-10-CM

## 2013-06-13 DIAGNOSIS — N390 Urinary tract infection, site not specified: Secondary | ICD-10-CM

## 2013-06-13 DIAGNOSIS — R413 Other amnesia: Secondary | ICD-10-CM

## 2013-06-13 LAB — POCT URINALYSIS DIPSTICK
Leukocytes, UA: NEGATIVE
Nitrite, UA: NEGATIVE
Urobilinogen, UA: 0.2

## 2013-06-13 MED ORDER — HYDROCODONE-ACETAMINOPHEN 5-325 MG PO TABS: ORAL_TABLET | ORAL | Status: DC

## 2013-06-13 NOTE — Telephone Encounter (Signed)
Ok to refill,  printed rx  

## 2013-06-13 NOTE — Telephone Encounter (Signed)
[facility]ist, [facility] ([facility]) is requesting that we mail a hard copy of the hydrocodone to their [place] address. Pt was seen today with ankle injury

## 2013-06-13 NOTE — Progress Notes (Addendum)
Patient ID: Sheena [name], female   DOB: [birth year], 79 y.o.   MRN: [id number]  Patient Active Problem List   Diagnosis Date Noted  . UTI (urinary tract infection) 06/05/2013  . Other and unspecified hyperlipidemia 05/14/2013  . Unspecified constipation 05/14/2013  . Osteoarthritis of left hip 01/11/2013  . History of syncope 11/15/2012  . Ankle fracture, left 11/15/2012  . H/O acute renal failure 11/15/2012  . Proximal muscle weakness 10/30/2012  . Neurotic excoriations 09/02/2012  . Loss of balance 06/06/2012  . Melanoma   . Asthma   . Insomnia, persistent 09/01/2011  . Severe major depression with psychotic features 09/01/2011  . Obesity (BMI 30-39.9) 09/01/2011  . Type II or unspecified type diabetes mellitus with renal manifestations, not stated as uncontrolled(250.40) 09/01/2011    Subjective:  CC:   Chief Complaint  Patient presents with  . Follow-up    f/u of UTI. Also had a non productive cough last night    HPI:   Sheena [name] a 79 y.o. female who presents Follow up on altered mental status accompanying UTI.  Her acute mental status changes have resolved but she continues to required 24 hour supervision due  For medication administration secondary to  medication noncompliance  Complicated by macular degeneration , assistance with ADLs due to proximal muscle weakness and limited mobility resulting from prior hip and ankle fractures, and recurrent syncopal events and falls.  She is thus far tolerating abx without diarrhea or nausea.  No frequency or burning.   Cough reported started < 24 hours ago. No fevers, dyspnea or wheezing. Appetite excellent   Obesity and DM diet reviewed with caregiver. Patient continues to overeat and sneak cookies and ice cream whenever she can .     Past Medical History  Diagnosis Date  . H/O: hysterectomy 1973    fibroids  . Lumbago   . Left arm pain   . Diabetes mellitus, type 2   . Melanoma     left arm, resected, annual  CXR's ordered  . Asthma     treated by Dr. Moca Callas  . Depression     Past Surgical History  Procedure Laterality Date  . Total hip arthroplasty  2003    left, Dr. Cleophas Dunker  . Breast biopsy  1985    normal  . Joint replacement   2003    Left Hip  . Abdominal hysterectomy  1973    fibroid uterus       The following portions of the patient's history were reviewed and updated as appropriate: Allergies, current medications, and problem list.    Review of Systems:   12 Pt  review of systems was negative except those addressed in the HPI,     History   Social History  . Marital Status: Married    Spouse Name: N/A    Number of Children: N/A  . Years of Education: N/A   Occupational History  . Not on file.   Social History Main Topics  . Smoking status: Never Smoker   . Smokeless tobacco: Never Used  . Alcohol Use: No     Comment: occasional  . Drug Use: No  . Sexual Activity: No   Other Topics Concern  . Not on file   Social History Narrative   Widowed 2013 after 60+ years of marriage.   One level house,  2 daytime caregivers and daughter livers with her temporarily     Objective:  Filed Vitals:   06/13/13 1142  BP: 118/60  Pulse: 98  Temp: 97.5 F (36.4 C)  Resp: 16     General appearance: alert, cooperative and appears stated age Ears: normal TM's and external ear canals both ears Throat: lips, mucosa, and tongue normal; teeth and gums normal Neck: no adenopathy, no carotid bruit, supple, symmetrical, trachea midline and thyroid not enlarged, symmetric, no tenderness/mass/nodules Back: symmetric, no curvature. ROM normal. No CVA tenderness. Lungs: clear to auscultation bilaterally Heart: regular rate and rhythm, S1, S2 normal, no murmur, click, rub or gallop Abdomen: soft, non-tender; bowel sounds normal; no masses,  no organomegaly Pulses: 2+ and symmetric Skin: Skin color, texture, turgor normal. No rashes or lesions Lymph nodes: Cervical,  supraclavicular, and axillary nodes normal.  Assessment and Plan:  UTI (urinary tract infection) Accompanied by delirium.  All resolved by repeat eval and UA  Obesity (BMI 30-39.9) I have addressed  BMI and recommended wt loss of 10% of body weight over the next 6 months using a low glycemic index diet and regular exercise a minimum of 5 days per week. Diet reviewed at length with caregiver   Proximal muscle weakness She requires use of a walker at home and a motorized wheelchair away from home due to recurrent loss of balance and falls.   History of syncope Her last syncopal event occurred March 2014, post void, resulting in admission and workup with ECHO and head CT, ,and resulting in left Ankle fracture  Which required surgery and rehabilitation    Osteoarthritis of left hip aggravated by recent ankle fracture. She now requires a lift chair to stand.   Mild cognitive impairment with memory loss She continues to require full time supervision to maintain safe living environment, without which she would require residence in assisted living for medication management, meal provision and assistance with ADLs   Updated Medication List Outpatient Encounter Prescriptions as of 06/13/2013  Medication Sig  . beta carotene w/minerals (OCUVITE) tablet TAKE ONE TABLET BY MOUTH EVERY DAY.  . Calcium Carbonate (CALCIUM 500 PO) Take by mouth.  . docusate sodium (COLACE) 100 MG capsule Take 1 capsule (100 mg total) by mouth 2 (two) times daily.  . ergocalciferol (DRISDOL) 50000 UNITS capsule Take 1 capsule (50,000 Units total) by mouth once a week.  . Fluticasone-Salmeterol (ADVAIR DISKUS) 100-50 MCG/DOSE AEPB Inhale 1 puff into the lungs every 12 (twelve) hours.  . Multiple Vitamin (MULTIVITAMIN) tablet Take 1 tablet by mouth daily.  . Omega-3 Fatty Acids (FISH OIL) 1000 MG CAPS TAKE ONE CAPSULE BY MOUTH EVERY DAY.  Marland Kitchen omeprazole (PRILOSEC) 40 MG capsule TAKE 1 CAPSULE BY MOUTH DAILY.  Marland Kitchen  polyethylene glycol powder (GLYCOLAX/MIRALAX) powder Take 17 g by mouth daily.  . sitaGLIPtin (JANUVIA) 100 MG tablet Take 1 tablet (100 mg total) by mouth daily.  . [DISCONTINUED] aspirin (QC LO-DOSE ASPIRIN) 81 MG EC tablet Take 1 tablet (81 mg total) by mouth daily with lunch. Swallow whole.  . [DISCONTINUED] atorvastatin (LIPITOR) 40 MG tablet TAKE ONE TABLET BY MOUTH EVERY DAY.  . [DISCONTINUED] escitalopram (LEXAPRO) 20 MG tablet TAKE (1) TABLET BY MOUTH DAILY.  . [DISCONTINUED] HYDROcodone-acetaminophen (NORCO/VICODIN) 5-325 MG per tablet TAKE 1 TABLET BY MOUTH TWICE DAILY  AS NEEDED FOR PAIN  . [DISCONTINUED] hydrOXYzine (ATARAX/VISTARIL) 25 MG tablet Take 1 tablet (25 mg total) by mouth 3 (three) times daily.  . [DISCONTINUED] insulin glargine (LANTUS) 100 UNIT/ML injection Inject 0.16 mLs (16 Units total) into the skin at bedtime.  . [DISCONTINUED] Insulin Pen Needle 31G X 8  MM MISC 1 Syringe by Does not apply route daily.  . [DISCONTINUED] loratadine (CLARITIN) 10 MG tablet TAKE ONE TABLET BY MOUTH EVERY DAY.  . [DISCONTINUED] losartan-hydrochlorothiazide (HYZAAR) 50-12.5 MG per tablet Take 1 tablet by mouth daily with breakfast.  . [DISCONTINUED] QUEtiapine (SEROQUEL) 50 MG tablet Take 1 tablet (50 mg total) by mouth 2 (two) times daily. i n the morning and evening  . [DISCONTINUED] sulfamethoxazole-trimethoprim (BACTRIM DS) 800-160 MG per tablet Take 1 tablet by mouth 2 (two) times daily.  . [DISCONTINUED] zolpidem (AMBIEN) 5 MG tablet TAKE 1 TABLET AT BEDTIME AS NEEDED FOR INSOMNIA

## 2013-06-13 NOTE — Patient Instructions (Addendum)
Your urinary tract infection has resolved, but I would like you to finish your septra .  Your blood sugars go up after dinner because of the starch and the desserts you are having  You do not need to increase your insulin.  Just choose lower "glycemic index" fruits and vegetables to eat   Follow up in 2 months

## 2013-06-13 NOTE — Progress Notes (Signed)
Pre-visit discussion using our clinic review tool. No additional management support is needed unless otherwise documented below in the visit note.  

## 2013-06-14 NOTE — Assessment & Plan Note (Addendum)
I have addressed  BMI and recommended wt loss of 10% of body weight over the next 6 months using a low glycemic index diet and regular exercise a minimum of 5 days per week. Diet reviewed at length with caregiver

## 2013-06-14 NOTE — Assessment & Plan Note (Signed)
Accompanied by delirium.  All resolved by repeat eval and UA

## 2013-06-15 ENCOUNTER — Other Ambulatory Visit: Payer: Self-pay | Admitting: Internal Medicine

## 2013-06-15 DIAGNOSIS — E1169 Type 2 diabetes mellitus with other specified complication: Secondary | ICD-10-CM

## 2013-06-15 MED ORDER — INSULIN GLARGINE 100 UNIT/ML ~~LOC~~ SOLN: 16.0000 [IU] | Freq: Every day | SUBCUTANEOUS | Status: DC

## 2013-06-15 MED ORDER — ZOLPIDEM TARTRATE 5 MG PO TABS: ORAL_TABLET | ORAL | Status: DC

## 2013-06-15 MED ORDER — INSULIN PEN NEEDLE 31G X 8 MM MISC: 1.0000 | Freq: Every day | Status: DC

## 2013-06-15 NOTE — Telephone Encounter (Signed)
Left vm.  States pt needs Lantus and needles and [facility] did not have the refill.  Asking [place] to call [facility] for refills on needles as well as Ambien.

## 2013-06-15 NOTE — Telephone Encounter (Signed)
Ok to refill 

## 2013-06-16 NOTE — Telephone Encounter (Signed)
Eprescribed.

## 2013-06-19 ENCOUNTER — Other Ambulatory Visit: Payer: Self-pay | Admitting: Internal Medicine

## 2013-07-04 ENCOUNTER — Other Ambulatory Visit: Payer: Self-pay | Admitting: Internal Medicine

## 2013-07-10 ENCOUNTER — Telehealth: Payer: Self-pay | Admitting: Internal Medicine

## 2013-07-10 NOTE — Telephone Encounter (Signed)
States Sheena [name] at [facility] needs hard copy mailed for hydrocodone.  States this was mailed last time and wants to do that again.

## 2013-07-11 MED ORDER — HYDROCODONE-ACETAMINOPHEN 5-325 MG PO TABS: ORAL_TABLET | ORAL | Status: DC

## 2013-07-11 NOTE — Telephone Encounter (Signed)
Hard script mailed as requested.

## 2013-07-11 NOTE — Telephone Encounter (Signed)
no printer

## 2013-07-11 NOTE — Telephone Encounter (Signed)
Last refill 06/13/13 okay to refill.

## 2013-07-14 ENCOUNTER — Telehealth: Payer: Self-pay | Admitting: Internal Medicine

## 2013-07-14 MED ORDER — SULFAMETHOXAZOLE-TRIMETHOPRIM 800-160 MG PO TABS: 1.0000 | ORAL_TABLET | Freq: Two times a day (BID) | ORAL | Status: DC

## 2013-07-14 NOTE — Telephone Encounter (Signed)
Patient notified as requested. 

## 2013-07-14 NOTE — Telephone Encounter (Signed)
Patient Information:  Caller Name: Asher Muir  Phone: [phone]  Patient: Sheena [name], Sheena [name]  Gender: Female  DOB: 1924-04-29  Age: 79 Years  PCP: Duncan Dull (Adults only)  Office Follow Up:  Does the office need to follow up with this patient?: Yes  Instructions For The Office: Family would like to have her urine tested.  PLEASE CONTACT FAMILY.  RN Note:  Family would like to have her urine tested.  PLEASE CONTACT FAMILY.  Symptoms  Reason For Call & Symptoms: Caregiver states that patient was treated for UTI three weeks ago . They believe the UTI is back. She is wearing incontinet pads and the odor is strong, color dark, sleeping more and no energy. They called for appt and was told Dr. Darrick Huntsman did not have appt and they would like to drop of U/A sample  Reviewed Health History In EMR: Yes  Reviewed Medications In EMR: Yes  Reviewed Allergies In EMR: Yes  Reviewed Surgeries / Procedures: Yes  Date of Onset of Symptoms: 07/12/2013  Guideline(s) Used:  Urination Pain - Female  Disposition Per Guideline:   See Today in Office  Reason For Disposition Reached:   Age > 50 years  Advice Given:  Fluids:   Drink extra fluids. Drink 8-10 glasses of liquids a day (Reason: to produce a dilute, non-irritating urine).  Cranberry Juice:   Some people think that drinking cranberry juice may help in fighting urinary tract infections. However, there is no good research that has ever proved this.  Call Back If:  You become worse.  RN Overrode Recommendation:  Make Appointment  Family would like to have her urine tested.  PLEASE CONTACT FAMILY.

## 2013-07-14 NOTE — Telephone Encounter (Signed)
Since it is too late to have them drop off a urine,  I sent an rx for Septra DS to [facility] for her to take over the weekend.  If she doesn't improve she will need to go to ER or make appt

## 2013-07-14 NOTE — Telephone Encounter (Signed)
Please advise 

## 2013-07-14 NOTE — Telephone Encounter (Signed)
It's a little too late to have her drop off urine now .  In the future if there are no appts available, let the patient  drop off a urine.

## 2013-08-01 ENCOUNTER — Other Ambulatory Visit: Payer: Self-pay | Admitting: Internal Medicine

## 2013-08-08 ENCOUNTER — Encounter: Payer: 59 | Admitting: Internal Medicine

## 2013-08-10 DIAGNOSIS — R55 Syncope and collapse: Secondary | ICD-10-CM

## 2013-08-10 HISTORY — DX: Syncope and collapse: R55

## 2013-08-22 ENCOUNTER — Observation Stay: Payer: Self-pay | Admitting: Internal Medicine

## 2013-08-22 LAB — URINALYSIS, COMPLETE
Glucose,UR: NEGATIVE mg/dL (ref 0–75)
Hyaline Cast: 7
Nitrite: NEGATIVE
Ph: 5 (ref 4.5–8.0)
Protein: NEGATIVE
RBC,UR: 2 /HPF (ref 0–5)
SPECIFIC GRAVITY: 1.024 (ref 1.003–1.030)
Squamous Epithelial: 2
WBC UR: 9 /HPF (ref 0–5)

## 2013-08-22 LAB — COMPREHENSIVE METABOLIC PANEL
ALK PHOS: 98 U/L
ANION GAP: 8 (ref 7–16)
Albumin: 3.4 g/dL (ref 3.4–5.0)
BILIRUBIN TOTAL: 0.3 mg/dL (ref 0.2–1.0)
BUN: 24 mg/dL — ABNORMAL HIGH (ref 7–18)
CREATININE: 1.6 mg/dL — AB (ref 0.60–1.30)
Calcium, Total: 9 mg/dL (ref 8.5–10.1)
Chloride: 102 mmol/L (ref 98–107)
Co2: 25 mmol/L (ref 21–32)
GFR CALC AF AMER: 33 — AB
GFR CALC NON AF AMER: 29 — AB
GLUCOSE: 176 mg/dL — AB (ref 65–99)
OSMOLALITY: 278 (ref 275–301)
POTASSIUM: 3.7 mmol/L (ref 3.5–5.1)
SGOT(AST): 23 U/L (ref 15–37)
SGPT (ALT): 18 U/L (ref 12–78)
Sodium: 135 mmol/L — ABNORMAL LOW (ref 136–145)
TOTAL PROTEIN: 7.6 g/dL (ref 6.4–8.2)

## 2013-08-22 LAB — TROPONIN I: Troponin-I: <0.02 ng/mL

## 2013-08-22 LAB — CK TOTAL AND CKMB (NOT AT ARMC)
CK, TOTAL: 53 U/L (ref 21–215)
CK-MB: 0.5 ng/mL (ref 0.5–3.6)

## 2013-08-22 LAB — CBC
HCT: 35.7 % (ref 35.0–47.0)
HGB: 12 g/dL (ref 12.0–16.0)
MCH: 30 pg (ref 26.0–34.0)
MCHC: 33.5 g/dL (ref 32.0–36.0)
MCV: 90 fL (ref 80–100)
Platelet: 223 10*3/uL (ref 150–440)
RBC: 3.99 10*6/uL (ref 3.80–5.20)
RDW: 15.2 % — ABNORMAL HIGH (ref 11.5–14.5)
WBC: 8.8 10*3/uL (ref 3.6–11.0)

## 2013-08-23 DIAGNOSIS — I519 Heart disease, unspecified: Secondary | ICD-10-CM

## 2013-08-23 HISTORY — PX: TRANSTHORACIC ECHOCARDIOGRAM: SHX275

## 2013-08-23 LAB — TROPONIN I: Troponin-I: <0.02 ng/mL

## 2013-08-23 LAB — CK-MB
CK-MB: 0.7 ng/mL (ref 0.5–3.6)
CK-MB: <0.5 ng/mL — ABNORMAL LOW (ref 0.5–3.6)

## 2013-08-24 ENCOUNTER — Encounter: Payer: Self-pay | Admitting: Internal Medicine

## 2013-08-24 ENCOUNTER — Telehealth: Payer: Self-pay

## 2013-08-24 ENCOUNTER — Telehealth: Payer: Self-pay | Admitting: *Deleted

## 2013-08-24 ENCOUNTER — Ambulatory Visit (INDEPENDENT_AMBULATORY_CARE_PROVIDER_SITE_OTHER): Payer: 59 | Admitting: Internal Medicine

## 2013-08-24 VITALS — BP 98/58 | HR 76 | Temp 97.3°F | Resp 14 | Wt 238.6 lb

## 2013-08-24 DIAGNOSIS — G3184 Mild cognitive impairment, so stated: Secondary | ICD-10-CM

## 2013-08-24 DIAGNOSIS — R55 Syncope and collapse: Secondary | ICD-10-CM

## 2013-08-24 DIAGNOSIS — E1129 Type 2 diabetes mellitus with other diabetic kidney complication: Secondary | ICD-10-CM

## 2013-08-24 DIAGNOSIS — G4700 Insomnia, unspecified: Secondary | ICD-10-CM

## 2013-08-24 DIAGNOSIS — E669 Obesity, unspecified: Secondary | ICD-10-CM

## 2013-08-24 MED ORDER — DOXEPIN HCL 10 MG PO CAPS: 10.0000 mg | ORAL_CAPSULE | Freq: Every evening | ORAL | Status: DC | PRN

## 2013-08-24 MED ORDER — LOSARTAN POTASSIUM 50 MG PO TABS: 50.0000 mg | ORAL_TABLET | Freq: Every day | ORAL | Status: DC

## 2013-08-24 MED ORDER — BLOOD PRESSURE KIT: PACK | Status: DC

## 2013-08-24 MED ORDER — DOXEPIN HCL 6 MG PO TABS: 1.0000 | ORAL_TABLET | Freq: Every evening | ORAL | Status: DC | PRN

## 2013-08-24 NOTE — Telephone Encounter (Signed)
Pharmacy stated that they do not make Doxepin 6 mg that the lowest dose is 10 mg. Please advise?

## 2013-08-24 NOTE — Telephone Encounter (Signed)
Attempted to contact pt regarding discharge from [facility] on 08/23/13.  Left detailed message on pt's voicemail asking her to call w/ any questions regarding her discharge instructions or medications and to make pt aware of appt w/ Valeria Batman, PA on 08/30/13 @ 1:15.

## 2013-08-24 NOTE — Patient Instructions (Addendum)
Sheena [name] is too sedated and too obese.   I am stopping her hydrocodone , ambien and seroquel (quetiapine).  I am reducing her blood pressure medication  To losartan only, (no hctz) ;  New rx sent to [facility].   Suspend the losartan hct for now and resume  The losartan only if bp is > 140/90   You can use doxepin (silenor) at bedtime as needed for insomnia  I recommend that you continue to have a paid caregiver 24/7 for Sheena Johndrow   tran

## 2013-08-24 NOTE — Progress Notes (Addendum)
Patient ID: Sheena [name], female   DOB: 12/05/1923, 80 y.o.   MRN: [id number]   Patient Active Problem List   Diagnosis Date Noted  . Other and unspecified hyperlipidemia 05/14/2013  . Unspecified constipation 05/14/2013  . Osteoarthritis of left hip 01/11/2013  . History of syncope 11/15/2012  . Ankle fracture, left 11/15/2012  . H/O acute renal failure 11/15/2012  . Proximal muscle weakness 10/30/2012  . Neurotic excoriations 09/02/2012  . Loss of balance 06/06/2012  . Melanoma   . Asthma   . Insomnia, persistent 09/01/2011  . Severe major depression with psychotic features 09/01/2011  . Obesity (BMI 30-39.9) 09/01/2011  . Type II or unspecified type diabetes mellitus with renal manifestations, not stated as uncontrolled(250.40) 09/01/2011    Subjective:  CC:   Chief Complaint  Patient presents with  . Follow-up    hospital for syncope.     HPI:   Sheena Gallus Pattersonis a 80 y.o. female who presents for hospital followup,.  She was admitted to [facility] on Jan 13th after syncopal event which occurred at a restaurant.  [patient had overeaten (12 dinner rolls per caregiver, before meal even began) , developed sudden onset of nausea, then fainted and fell to the floor.. .  Caregiver Daleen Snook is with her today and was with her at the time of the incident . She reported seeing vomitus in her mouth when she rolled her over and patient was apneic with a puulse.  Caregiver did Heimlich ,  Patient started throwing up and breathing again.  And was taken to [facility] by EMS.  ED physician tried to send her home but Caregiver insisted she be admitted. Marland Kitchen   Discharged on the 14th.  Carotid dopplers and CT scan of head was  done as well as cxr,  Per caregiver all were negative for aacute events and UTI.  Wearing a telemetry monitor,  Until Friday.     Sleepy currently.  Received ambein last night. She has not slept since the night of the 12th  ,  Finally fell alseep last night  Around 12:30.  This morningn  was difficult to arouse.   Chronic insomnia  Goes to sleep late and wakes up late.  Hypersomnolent.  Caregiver uses ambien 5 mg prn but it does not work .   Has a left forehead abrasion.  Hematoma noted on CT scan.  Cognitive impairment: mild complicated by history of  severe depression, and personality disorder. Patient continues to require 24 hours of supervision and assistance for medication administration due to macular degeneration,  meal preparation, due ot histor of overeating and poor judgement  and ADLs  With frequent falls due to limited ,mobility due to  OA of hip and ankle with history of prior fractures requiring surgery .  If she is not primpted she will spend the day in bed.  Without supervision of meals she will not follow a sensible diet.  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 H/O: hysterectomy 1973    fibroids  . Lumbago   . Left arm pain   . Diabetes mellitus, type 2   . Melanoma     left arm, resected, annual CXR's ordered  . Asthma     treated by Dr. Donneta Romberg  . Depression     Past Surgical History  Procedure Laterality Date  . Total hip arthroplasty  2003    left, Dr. Durward Fortes  .  Breast biopsy  1985    normal  . Joint replacement   2003    Left Hip  . Abdominal hysterectomy  1973    fibroid uterus       The following portions of the patient's history were reviewed and updated as appropriate: Allergies, current medications, and problem list.    Review of Systems:   12 Pt  review of systems was negative except those addressed in the HPI,     History   Social History  . Marital Status: Married    Spouse Name: N/A    Number of Children: N/A  . Years of Education: N/A   Occupational History  . Not on file.   Social History Main Topics  . Smoking status: Never Smoker   . Smokeless tobacco: Never Used  . Alcohol Use: No     Comment: occasional  . Drug Use: No  . Sexual Activity: No    Other Topics Concern  . Not on file   Social History Narrative   Widowed 2013 after 60+ years of marriage.   One level house,  2 daytime caregivers and daughter livers with her temporarily     Objective:  Filed Vitals:   08/24/13 1103  BP: 98/58  Pulse:   Temp:   Resp:      General appearance: alert, cooperative and appears stated age Ears: normal TM's and external ear canals both ears Throat: lips, mucosa, and tongue normal; teeth and gums normal Neck: no adenopathy, no carotid bruit, supple, symmetrical, trachea midline and thyroid not enlarged, symmetric, no tenderness/mass/nodules Back: symmetric, no curvature. ROM normal. No CVA tenderness. Lungs: clear to auscultation bilaterally Heart: regular rate and rhythm, S1, S2 normal, no murmur, click, rub or gallop Abdomen: soft, non-tender; bowel sounds normal; no masses,  no organomegaly Pulses: 2+ and symmetric Skin: Skin color, texture, turgor normal. No rashes or lesions Lymph nodes: Cervical, supraclavicular, and axillary nodes normal.  Assessment and Plan: Insomnia, persistent She has been receiving ambien for persistent insomnia, but , per caregiver, has only been  Receiving it once or twice per week. She is concerned that other family members have been using it.  Dc ambien  Type II or unspecified type diabetes mellitus with renal manifestations, not stated as uncontrolled(250.40) Well-controlled on current medications.  hemoglobin A1c has been consistently less than 7.0 . Will raise the Lantus to 18 units from 15 .  Based on family and caregivers recent blood sugars reported since discharge from [facility] being elevated.  Se is up-to-date on eye exams and his foot exam is normal. She is on the appropriate medications. Renal funciton is stable,  Metformin has been discontinued.     Syncope and collapse Etiology unclear. Cardiac monitoring is ongoing  Obesity (BMI 30-39.9) She has a tendency to overeat on a  regular basis per caregiver. This may be a manifestation of her dementia.  Reviewed diet, which is low glycemic   Mild cognitive impairment with memory loss Complicated by depression and personality disorder and macular degenertative.  She is unsafe to live alone or self administer medications and has had recurrent hospitalizations due to syncope , falls , overeating leading to choking, etc.  Family has provided 24 hour caregiver.    Updated Medication List Outpatient Encounter Prescriptions as of 08/24/2013  Medication Sig  . ASPIRIN LOW DOSE 81 MG EC tablet TAKE ONE TABLET BY MOUTH EVERY DAY.  . beta carotene w/minerals (OCUVITE) tablet TAKE ONE TABLET BY MOUTH EVERY  DAY.  . Calcium Carbonate (CALCIUM 500 PO) Take by mouth.  . docusate sodium (COLACE) 100 MG capsule Take 1 capsule (100 mg total) by mouth 2 (two) times daily.  . ergocalciferol (DRISDOL) 50000 UNITS capsule Take 1 capsule (50,000 Units total) by mouth once a week.  . Fluticasone-Salmeterol (ADVAIR DISKUS) 100-50 MCG/DOSE AEPB Inhale 1 puff into the lungs every 12 (twelve) hours.  . insulin glargine (LANTUS) 100 UNIT/ML injection Inject 0.16 mLs (16 Units total) into the skin at bedtime.  . Insulin Pen Needle 31G X 8 MM MISC 1 Syringe by Does not apply route daily.  Marland Kitchen LANTUS SOLOSTAR 100 UNIT/ML SOPN INJECT 15 UNITS INTO SKIN AT BEDTIME  . loratadine (CLARITIN) 10 MG tablet TAKE ONE TABLET BY MOUTH EVERY DAY.  . Multiple Vitamin (MULTIVITAMIN) tablet Take 1 tablet by mouth daily.  . Omega-3 Fatty Acids (FISH OIL) 1000 MG CAPS TAKE ONE CAPSULE BY MOUTH EVERY DAY.  Marland Kitchen omeprazole (PRILOSEC) 40 MG capsule TAKE 1 CAPSULE BY MOUTH DAILY.  Marland Kitchen polyethylene glycol powder (GLYCOLAX/MIRALAX) powder Take 17 g by mouth daily.  . sitaGLIPtin (JANUVIA) 100 MG tablet Take 1 tablet (100 mg total) by mouth daily.  . [DISCONTINUED] atorvastatin (LIPITOR) 40 MG tablet TAKE ONE TABLET BY MOUTH EVERY DAY.  . [DISCONTINUED] escitalopram (LEXAPRO) 20  MG tablet TAKE (1) TABLET BY MOUTH DAILY.  . [DISCONTINUED] HYDROcodone-acetaminophen (NORCO/VICODIN) 5-325 MG per tablet TAKE 1 TABLET BY MOUTH TWICE DAILY  AS NEEDED FOR PAIN  . [DISCONTINUED] hydrOXYzine (ATARAX/VISTARIL) 25 MG tablet Take 1 tablet (25 mg total) by mouth 3 (three) times daily.  . [DISCONTINUED] losartan-hydrochlorothiazide (HYZAAR) 50-12.5 MG per tablet TAKE ONE TABLET BY MOUTH ONCE DAILY.  . [DISCONTINUED] QUEtiapine (SEROQUEL) 50 MG tablet Take 1 tablet (50 mg total) by mouth 2 (two) times daily. i n the morning and evening  . [DISCONTINUED] sulfamethoxazole-trimethoprim (BACTRIM DS) 800-160 MG per tablet Take 1 tablet by mouth 2 (two) times daily.  . [DISCONTINUED] sulfamethoxazole-trimethoprim (SEPTRA DS) 800-160 MG per tablet Take 1 tablet by mouth 2 (two) times daily.  . [DISCONTINUED] zolpidem (AMBIEN) 5 MG tablet TAKE ONE TABLET BY MOUTH AT BEDTIME AS NEEDED FOR INSOMNIA.  Marland Kitchen Blood Pressure KIT Use daily to check blood pressure  401.9  . [DISCONTINUED] Doxepin HCl 6 MG TABS Take 1 tablet (6 mg total) by mouth at bedtime as needed.  . [DISCONTINUED] losartan (COZAAR) 50 MG tablet Take 1 tablet (50 mg total) by mouth daily.

## 2013-08-24 NOTE — Telephone Encounter (Signed)
Changed to 10 mg and re sent

## 2013-08-25 NOTE — Telephone Encounter (Signed)
The doxepin is not cover under patient insurance and pharmacy would like to know if you want to do a prior authorization or try something else please advise.

## 2013-08-25 NOTE — Telephone Encounter (Signed)
Pharmacy notified to ho;d off stated they will notify patient.

## 2013-08-25 NOTE — Telephone Encounter (Signed)
I would like to to do nothing for now.  Since caregiver states that she is not using it every night and everything is either contraindicated or not covered. She was very lethargic in the office yesterday after receiving ambien.

## 2013-08-26 ENCOUNTER — Encounter: Payer: Self-pay | Admitting: Internal Medicine

## 2013-08-26 DIAGNOSIS — R55 Syncope and collapse: Secondary | ICD-10-CM | POA: Insufficient documentation

## 2013-08-26 NOTE — Assessment & Plan Note (Signed)
She has been receiving ambien for persistent insomnia, but , per caregiver, has only been  Receiving it once or twice per week. She is concerned that other family members have been using it.  Dc Lorrin Mais

## 2013-08-26 NOTE — Assessment & Plan Note (Signed)
Etiology unclear. Cardiac monitoring is ongoing

## 2013-08-26 NOTE — Assessment & Plan Note (Signed)
She has a tendency to overeat on a regular basis per caregiver. This may be a manifestation of her dementia.  Reviewed diet, which is low glycemic

## 2013-08-26 NOTE — Assessment & Plan Note (Signed)
Well-controlled on current medications.  hemoglobin A1c has been consistently less than 7.0 . Will raise the Lantus to 18 units from 15 .  Based on family and caregivers recent blood sugars reported since discharge from [facility] being elevated.  Se is up-to-date on eye exams and his foot exam is normal. She is on the appropriate medications. Renal funciton is stable,  Metformin has been discontinued.

## 2013-08-28 ENCOUNTER — Encounter: Payer: 59 | Admitting: Internal Medicine

## 2013-08-30 ENCOUNTER — Encounter: Payer: Self-pay | Admitting: Physician Assistant

## 2013-08-30 ENCOUNTER — Ambulatory Visit (INDEPENDENT_AMBULATORY_CARE_PROVIDER_SITE_OTHER): Payer: 59 | Admitting: Physician Assistant

## 2013-08-30 VITALS — BP 118/62 | HR 76 | Ht 66.0 in | Wt 248.5 lb

## 2013-08-30 DIAGNOSIS — R55 Syncope and collapse: Secondary | ICD-10-CM

## 2013-08-30 NOTE — Patient Instructions (Signed)
Your heart monitor shows no evidence of abnormal heart rhythms. You have no evidence of blockages on your echocardiogram (ultrasound). Your EKG shows no evidence of a heart attack.  Your passing out episode may have been caused by an abnormality with your blood pressure caused by certain triggers such as nausea, pain, straining, stress, the sight of blood, etc. This is called vagal-mediated syncope.   We will see you back in 3 months.  If you continue to have multiple or more frequent episodes, please call us and we can arrange a heart monitor for a longer period of time.

## 2013-08-30 NOTE — Assessment & Plan Note (Signed)
The patient had an extensive syncope work-up at [facility], as above, which was unrevealing. Holter monitoring indicated sinus arrhythmia, no arrhythmias otherwise. She has had several of these episodes which are years apart, and surrounding vagal-stimulation, nausea most recently, straining in the past. After reviewing with Dr. Ula Lingo who spoke with the patient, no further cardiac work-up is planned. Lengthy cardiac monitoring would be of low yield. Will see in follow-up in 3 months to reassess symptoms. Advised to call sooner if symptoms develop or syncope recurs.

## 2013-08-30 NOTE — Progress Notes (Signed)
Patient ID: Sheena [name], female   DOB: May 22, 1924, 80 y.o.   MRN: [id number]   Date:  08/30/2013   ID:  Sheena [name], DOB August 23, 1923, MRN [id number]  PCP:  Deborra Medina, MD  Primary Cardiologist: New- staffed w/ Dr. Ula Lingo   History of Present Illness:  Sheena [name] is a 80 y.o. female w/ PMHx s/f morbid obesity, DM2, HTN, HLD, GERD, CKD and bipolar disorder who was admitted at [facility] 1/13 to 08/23/13 with syncope. She presents today as a new patient visit to follow-up with Holter monitor results.   She reports two prior episodes of syncope- one while seated on the toilet (straining) and the other while seated on a stool while cooking. She was enjoying dinner at [employer] and thinks she may have over-eaten. She became nauseated, then awoke on the floor 2-3 minutes later. He grand daughter was with her. She fell to the floor. She was rolled over and food bolus was seen in her mouth. She was given the Heimlich and CPR for < 1 minute. She reports the patient had no pulses and was not breathing. More food was removed and the patient regained consciousness. No chest pain, palpitations, involuntary movements, incontinence, tongue biting or postictal state. She was subsequently admitted.   EKG in the ED showed NSR, inferior IVCD, no ST/T changes or Q waves.   2D echo 1/14: EF 60-65%, borderline concentric LVH, impaired LV diastolic relaxation, LA ULN  Noncontrast head CT 1/14: atrophy, chronic microvascular ischemia, no acute abnormality  Carotid u/s 1/14: R ICA < 50%, L ICA minor nonobstructive  48 hour Holter monitor was arranged, interpreted by Dr. Fletcher Anon: NSR, occasional PACs, no significant arrhythmias  Orthostatics negative in the hospital.   Blood glucose 90 in the ED per caregiver.   No involuntary movements, tongue biting, incontinence, post ictal state.  Two prior episodes, one while sitting on the toilet straining. The other while sitting on a stool cooking.   EKG:  NSR, Q waves III, aVF, 0.5 mm flat ST depression I, aVL  Wt Readings from Last 3 Encounters:  08/24/13 238 lb 9 oz (108.211 kg)  06/13/13 245 lb 8 oz (111.358 kg)  06/05/13 247 lb (112.038 kg)    Past Medical History  Diagnosis Date  . H/O: hysterectomy 1973    fibroids  . Lumbago   . Left arm pain   . Diabetes mellitus, type 2   . Melanoma     left arm, resected, annual CXR's ordered  . Asthma     treated by Dr. Donneta Romberg  . Depression     Current Outpatient Prescriptions  Medication Sig Dispense Refill  . ASPIRIN LOW DOSE 81 MG EC tablet TAKE ONE TABLET BY MOUTH EVERY DAY.  30 tablet  11  . atorvastatin (LIPITOR) 40 MG tablet TAKE ONE TABLET BY MOUTH EVERY DAY.  30 tablet  0  . beta carotene w/minerals (OCUVITE) tablet TAKE ONE TABLET BY MOUTH EVERY DAY.  30 tablet  11  . Blood Pressure KIT Use daily to check blood pressure  401.9  1 each  0  . Calcium Carbonate (CALCIUM 500 PO) Take by mouth.      . docusate sodium (COLACE) 100 MG capsule Take 1 capsule (100 mg total) by mouth 2 (two) times daily.  180 capsule  3  . doxepin (SINEQUAN) 10 MG capsule Take 1 capsule (10 mg total) by mouth at bedtime as needed.  30 capsule  2  . ergocalciferol (DRISDOL)  50000 UNITS capsule Take 1 capsule (50,000 Units total) by mouth once a week.  4 capsule  2  . escitalopram (LEXAPRO) 20 MG tablet TAKE (1) TABLET BY MOUTH DAILY.  30 tablet  2  . Fluticasone-Salmeterol (ADVAIR DISKUS) 100-50 MCG/DOSE AEPB Inhale 1 puff into the lungs every 12 (twelve) hours.  60 each  11  . hydrOXYzine (ATARAX/VISTARIL) 25 MG tablet Take 1 tablet (25 mg total) by mouth 3 (three) times daily.  90 tablet  3  . insulin glargine (LANTUS) 100 UNIT/ML injection Inject 0.16 mLs (16 Units total) into the skin at bedtime.  5 pen  3  . Insulin Pen Needle 31G X 8 MM MISC 1 Syringe by Does not apply route daily.  100 each  3  . LANTUS SOLOSTAR 100 UNIT/ML SOPN INJECT 15 UNITS INTO SKIN AT BEDTIME  15 pen  5  . loratadine  (CLARITIN) 10 MG tablet TAKE ONE TABLET BY MOUTH EVERY DAY.  30 tablet  5  . losartan (COZAAR) 50 MG tablet Take 1 tablet (50 mg total) by mouth daily.  90 tablet  3  . losartan-hydrochlorothiazide (HYZAAR) 50-12.5 MG per tablet TAKE ONE TABLET BY MOUTH ONCE DAILY.  30 tablet  5  . Multiple Vitamin (MULTIVITAMIN) tablet Take 1 tablet by mouth daily.      . Omega-3 Fatty Acids (FISH OIL) 1000 MG CAPS TAKE ONE CAPSULE BY MOUTH EVERY DAY.  30 capsule  11  . omeprazole (PRILOSEC) 40 MG capsule TAKE 1 CAPSULE BY MOUTH DAILY.  30 capsule  5  . polyethylene glycol powder (GLYCOLAX/MIRALAX) powder Take 17 g by mouth daily.  3350 g  1  . sitaGLIPtin (JANUVIA) 100 MG tablet Take 1 tablet (100 mg total) by mouth daily.  30 tablet  5  . sulfamethoxazole-trimethoprim (BACTRIM DS) 800-160 MG per tablet Take 1 tablet by mouth 2 (two) times daily.  14 tablet  0  . sulfamethoxazole-trimethoprim (SEPTRA DS) 800-160 MG per tablet Take 1 tablet by mouth 2 (two) times daily.  14 tablet  0   No current facility-administered medications for this visit.    Allergies:   No Known Allergies  Social History:  The patient  reports that she has never smoked. She has never used smokeless tobacco. She reports that she does not drink alcohol or use illicit drugs.   Family History:  Family History  Problem Relation Age of Onset  . Diabetes Mother   . Heart disease Mother     CHF  . Heart disease Father   . Diabetes Father     Review of Systems: General: negative for chills, fever, night sweats or weight changes.  Cardiovascular: negative for chest pain, dyspnea on exertion, edema, orthopnea, palpitations, paroxysmal nocturnal dyspnea or shortness of breath Dermatological: negative for rash Respiratory:  negative for cough or wheezing Urologic:  negative for hematuria Abdominal: negative for nausea, vomiting, diarrhea, bright red blood per rectum, melena, or hematemesis Neurologic: positive for syncope, negative for  visual changes, syncope, or dizziness All other systems reviewed and are otherwise negative except as noted above.  PHYSICAL EXAM: VS:  There were no vitals taken for this visit. Well nourished, well developed, in no acute distress HEENT: normal, PERRL Neck: no JVD or bruits Cardiac:  normal S1, S2; RRR; no murmur or gallops Lungs:  clear to auscultation bilaterally, no wheezing, rhonchi or rales Abd: soft, nontender, no hepatomegaly, normoactive BS x 4 quads Ext: no edema, cyanosis or clubbing Skin: warm and dry, cap  refill < 2 sec Neuro:  CNs 2-12 intact, no focal abnormalities noted Musculoskeletal: strength and tone appropriate for age  Psych: normal affect

## 2013-08-31 ENCOUNTER — Telehealth: Payer: Self-pay | Admitting: *Deleted

## 2013-08-31 NOTE — Telephone Encounter (Signed)
Requesting refill on Hydrocodone daughter called and advised of needing refill [name].

## 2013-08-31 NOTE — Progress Notes (Signed)
I saw & examined the patient after Mr. Everlean Cherry. 80 y/o woman admitted for syncope.  To date, she has had an unrevealing evaluation. No clear etiology identified.   No arrythmia. Echo normal. I agree that post-prandial vasovagal syncope is a valid diagnosis.  Would not do further work-up for now unless there is another event.  Leonie Man, MD

## 2013-08-31 NOTE — Telephone Encounter (Signed)
Denied.  Per caregiver who was at her hospital follow up,  The patient has not needed the hydrocodone at all.  The patient was also very lethargic at her last visit.

## 2013-09-01 ENCOUNTER — Other Ambulatory Visit: Payer: Self-pay | Admitting: Internal Medicine

## 2013-09-01 NOTE — Telephone Encounter (Signed)
Patients DPR notified and advised of the reason for denial daughter reported giving mother Ambien says patient cannot sleep a nervous wreck and ask if you had reviewed  Notes from cardiology.

## 2013-09-02 NOTE — Telephone Encounter (Signed)
No I have not reviewed the notes from cardiology.  I am not refilling the ambien because the last two times I have seen patient she has been lethargic and both times she had received ambien.

## 2013-09-05 NOTE — Telephone Encounter (Signed)
Left message for pt to call back  °

## 2013-09-08 NOTE — Telephone Encounter (Signed)
Left message on voicemail to call office.  

## 2013-09-21 DIAGNOSIS — G3184 Mild cognitive impairment, so stated: Secondary | ICD-10-CM

## 2013-09-21 DIAGNOSIS — R413 Other amnesia: Secondary | ICD-10-CM

## 2013-09-21 DIAGNOSIS — E1129 Type 2 diabetes mellitus with other diabetic kidney complication: Secondary | ICD-10-CM

## 2013-09-21 DIAGNOSIS — G4700 Insomnia, unspecified: Secondary | ICD-10-CM

## 2013-09-21 DIAGNOSIS — R55 Syncope and collapse: Secondary | ICD-10-CM

## 2013-09-21 DIAGNOSIS — E669 Obesity, unspecified: Secondary | ICD-10-CM

## 2013-09-25 ENCOUNTER — Ambulatory Visit: Payer: 59 | Admitting: Internal Medicine

## 2013-10-01 DIAGNOSIS — R55 Syncope and collapse: Secondary | ICD-10-CM

## 2013-10-01 DIAGNOSIS — L981 Factitial dermatitis: Secondary | ICD-10-CM

## 2013-10-01 DIAGNOSIS — M1610 Unilateral primary osteoarthritis, unspecified hip: Secondary | ICD-10-CM

## 2013-10-01 DIAGNOSIS — M169 Osteoarthritis of hip, unspecified: Secondary | ICD-10-CM

## 2013-10-04 DIAGNOSIS — G3184 Mild cognitive impairment, so stated: Secondary | ICD-10-CM | POA: Insufficient documentation

## 2013-10-04 NOTE — Assessment & Plan Note (Addendum)
She requires use of a walker at home and a motorized wheelchair away from home due to recurrent loss of balance and falls.

## 2013-10-04 NOTE — Assessment & Plan Note (Signed)
Complicated by depression and personality disorder and macular degenertative.  She is unsafe to live alone or self administer medications and has had recurrent hospitalizations due to syncope , falls , overeating leading to choking, etc.  Family has provided 24 hour caregiver.

## 2013-10-04 NOTE — Assessment & Plan Note (Signed)
aggravated by recent ankle fracture. She now requires a lift chair to stand.

## 2013-10-04 NOTE — Assessment & Plan Note (Signed)
She continues to require full time supervision to maintain safe living environment, without which she would require residence in assisted living for medication management, meal provision and assistance with ADLs

## 2013-10-04 NOTE — Assessment & Plan Note (Signed)
Her last syncopal event occurred March 2014, post void, resulting in admission and workup with ECHO and head CT, ,and resulting in left Ankle fracture  Which required surgery and rehabilitation

## 2013-10-11 ENCOUNTER — Telehealth: Payer: Self-pay | Admitting: Internal Medicine

## 2013-10-11 NOTE — Telephone Encounter (Signed)
Patient notifed and forms placed up front.

## 2013-10-11 NOTE — Telephone Encounter (Signed)
The patient's grandson (POA) called to see if the patient's paper work for long term care insurance was ready.

## 2013-10-12 ENCOUNTER — Telehealth: Payer: Self-pay | Admitting: Internal Medicine

## 2013-10-12 MED ORDER — HYDROCODONE-ACETAMINOPHEN 5-325 MG PO TABS: 1.0000 | ORAL_TABLET | Freq: Four times a day (QID) | ORAL | Status: DC | PRN

## 2013-10-12 NOTE — Telephone Encounter (Signed)
Please advise? You DC due to Falls right?

## 2013-10-12 NOTE — Telephone Encounter (Signed)
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  She's 89 and sedentary .  She can use tylenol and motrin ,  No narcotics

## 2013-10-12 NOTE — Telephone Encounter (Signed)
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

## 2013-10-12 NOTE — Telephone Encounter (Signed)
Pt nurse calling.  States pt was previously prescribed a pain medication for back pain.  States at last appt they told Dr. Derrel Nip she could cancel the script due to the patient not taking the medication for a while.  Pt was making a pie and hurt her back and they are asking if the prescription can be refilled just for a couple of days to help the pt.  No appt available.  States it is too hard to take the pt to ER due to size and weather.  Please advise.    Nurse:  Daleen Snook [phone]

## 2013-10-12 NOTE — Telephone Encounter (Signed)
If they have not tried ibuprofren 800mg   And tylenol 500 mg every 8 hours they should do tht today  I cannot sign an rx until tomorrow anymore be i am out of offic.e

## 2013-10-13 ENCOUNTER — Encounter: Payer: Self-pay | Admitting: Internal Medicine

## 2013-10-13 ENCOUNTER — Ambulatory Visit (INDEPENDENT_AMBULATORY_CARE_PROVIDER_SITE_OTHER): Payer: 59 | Admitting: Internal Medicine

## 2013-10-13 VITALS — BP 118/60 | HR 72 | Temp 97.8°F | Resp 16 | Wt 251.5 lb

## 2013-10-13 DIAGNOSIS — M546 Pain in thoracic spine: Secondary | ICD-10-CM

## 2013-10-13 MED ORDER — HYDROCODONE-ACETAMINOPHEN 5-325 MG PO TABS: 1.0000 | ORAL_TABLET | Freq: Two times a day (BID) | ORAL | Status: DC | PRN

## 2013-10-13 NOTE — Telephone Encounter (Signed)
Seen in office today  

## 2013-10-13 NOTE — Progress Notes (Signed)
Patient ID: Sheena [name], female   DOB: 1924-08-01, 80 y.o.   MRN: [id number]   Patient Active Problem List   Diagnosis Date Noted  . Back pain, thoracic 10/14/2013  . Mild cognitive impairment with memory loss 10/04/2013  . Syncope and collapse 08/26/2013  . Other and unspecified hyperlipidemia 05/14/2013  . Unspecified constipation 05/14/2013  . Osteoarthritis of left hip 01/11/2013  . History of syncope 11/15/2012  . Ankle fracture, left 11/15/2012  . H/O acute renal failure 11/15/2012  . Proximal muscle weakness 10/30/2012  . Neurotic excoriations 09/02/2012  . Loss of balance 06/06/2012  . Melanoma   . Asthma   . Insomnia, persistent 09/01/2011  . Severe major depression with psychotic features 09/01/2011  . Obesity (BMI 30-39.9) 09/01/2011  . Type II or unspecified type diabetes mellitus with renal manifestations, not stated as uncontrolled(250.40) 09/01/2011    Subjective:  CC:   Chief Complaint  Patient presents with  . Back Pain    HPI:   MEOSHIA [name] is a 80 y.o. female who presents for evaluation of Back pain .  Patient reports pain on the right side in the mid thoracic side, present for over a week,  Aggravated by prolonged standing last week when cooking,  Taking ibuprofen and tylenol without relief., makin it difficult to stand up and get out of chair.     Past Medical History  Diagnosis Date  . H/O: hysterectomy 1973    fibroids  . Lumbago   . Left arm pain   . Diabetes mellitus, type 2   . Melanoma     left arm, resected, annual CXR's ordered  . Asthma     treated by Dr. Donneta Romberg  . Depression   . Syncope 08/2013    Suspected vagal-mediated    Past Surgical History  Procedure Laterality Date  . Total hip arthroplasty  2003    left, Dr. Durward Fortes  . Breast biopsy  1985    normal  . Joint replacement   2003    Left Hip  . Abdominal hysterectomy  1973    fibroid uterus  . Transthoracic echocardiogram  08/23/13    EF 60-65%, borderline  concentric LVH, impaired LV diastolic relaxation, LA ULN       The following portions of the patient's history were reviewed and updated as appropriate: Allergies, current medications, and problem list.    Review of Systems:   Patient denies headache, fevers, malaise, unintentional weight loss, skin rash, eye pain, sinus congestion and sinus pain, sore throat, dysphagia,  hemoptysis , cough, dyspnea, wheezing, chest pain, palpitations, orthopnea, edema, abdominal pain, nausea, melena, diarrhea, constipation, flank pain, dysuria, hematuria, urinary  Frequency, nocturia, numbness, tingling, seizures,  Focal weakness, Loss of consciousness,  Tremor, insomnia, depression, anxiety, and suicidal ideation.     History   Social History  . Marital Status: Married    Spouse Name: N/A    Number of Children: N/A  . Years of Education: N/A   Occupational History  . Not on file.   Social History Main Topics  . Smoking status: Never Smoker   . Smokeless tobacco: Never Used  . Alcohol Use: No     Comment: occasional  . Drug Use: No  . Sexual Activity: No   Other Topics Concern  . Not on file   Social History Narrative   Widowed 2013 after 60+ years of marriage.   One level house,  2 daytime caregivers and daughter livers with her temporarily  Objective:  Filed Vitals:   10/13/13 1041  BP: 118/60  Pulse: 72  Temp: 97.8 F (36.6 C)  Resp: 16     General appearance: alert, cooperative and appears stated age Ears: normal TM's and external ear canals both ears Throat: lips, mucosa, and tongue normal; teeth and gums normal Neck: no adenopathy, no carotid bruit, supple, symmetrical, trachea midline and thyroid not enlarged, symmetric, no tenderness/mass/nodules Back: symmetric, no curvature. ROM normal. No CVA tenderness. Lungs: clear to auscultation bilaterally Heart: regular rate and rhythm, S1, S2 normal, no murmur, click, rub or gallop Abdomen: soft, non-tender; bowel  sounds normal; no masses,  no organomegaly Pulses: 2+ and symmetric Skin: Skin color, texture, turgor normal. No rashes or lesions Lymph nodes: Cervical, supraclavicular, and axillary nodes normal.  Assessment and Plan:  Back pain, thoracic Adding vicodin for persistent pain limiting her activity,    Updated Medication List Outpatient Encounter Prescriptions as of 10/13/2013  Medication Sig  . ASPIRIN LOW DOSE 81 MG EC tablet TAKE ONE TABLET BY MOUTH EVERY DAY.  Marland Kitchen atorvastatin (LIPITOR) 40 MG tablet TAKE ONE TABLET BY MOUTH EVERY DAY.  . beta carotene w/minerals (OCUVITE) tablet TAKE ONE TABLET BY MOUTH EVERY DAY.  Marland Kitchen Blood Pressure KIT Use daily to check blood pressure  401.9  . Calcium Carbonate (CALCIUM 500 PO) Take by mouth.  . docusate sodium (COLACE) 100 MG capsule Take 1 capsule (100 mg total) by mouth 2 (two) times daily.  Marland Kitchen doxepin (SINEQUAN) 10 MG capsule Take 1 capsule (10 mg total) by mouth at bedtime as needed.  . ergocalciferol (DRISDOL) 50000 UNITS capsule Take 1 capsule (50,000 Units total) by mouth once a week.  . escitalopram (LEXAPRO) 20 MG tablet TAKE (1) TABLET BY MOUTH DAILY.  Marland Kitchen Fluticasone-Salmeterol (ADVAIR DISKUS) 100-50 MCG/DOSE AEPB Inhale 1 puff into the lungs every 12 (twelve) hours.  . hydrOXYzine (ATARAX/VISTARIL) 25 MG tablet Take 25 mg by mouth daily.  . insulin glargine (LANTUS) 100 UNIT/ML injection Inject 0.16 mLs (16 Units total) into the skin at bedtime.  . Insulin Pen Needle 31G X 8 MM MISC 1 Syringe by Does not apply route daily.  Marland Kitchen loratadine (CLARITIN) 10 MG tablet TAKE ONE TABLET BY MOUTH EVERY DAY.  . Multiple Vitamin (MULTIVITAMIN) tablet Take 1 tablet by mouth daily.  . Omega-3 Fatty Acids (FISH OIL) 1000 MG CAPS TAKE ONE CAPSULE BY MOUTH EVERY DAY.  Marland Kitchen polyethylene glycol powder (GLYCOLAX/MIRALAX) powder Take 17 g by mouth daily.  . sitaGLIPtin (JANUVIA) 100 MG tablet Take 1 tablet (100 mg total) by mouth daily.  Marland Kitchen HYDROcodone-acetaminophen  (NORCO/VICODIN) 5-325 MG per tablet Take 1 tablet by mouth 2 (two) times daily as needed for moderate pain.  Marland Kitchen LANTUS SOLOSTAR 100 UNIT/ML SOPN INJECT 15 UNITS INTO SKIN AT BEDTIME  . omeprazole (PRILOSEC) 40 MG capsule TAKE 1 CAPSULE BY MOUTH DAILY.  . [DISCONTINUED] HYDROcodone-acetaminophen (NORCO/VICODIN) 5-325 MG per tablet Take 1 tablet by mouth every 6 (six) hours as needed for moderate pain.     Orders Placed This Encounter  Procedures  . Ambulatory referral to Home Health    No Follow-up on file.

## 2013-10-13 NOTE — Patient Instructions (Signed)
Continue ibuprofen every  8 hours up to  600 mg  Or naproxen one tablet twice daily  You may use up to 2 vicodin daily in addition to the ibuprofen   PT  Home health for back pain and strengthening    Watch for constipation   The medication for sleep,  Doxepin was sent to [facility]

## 2013-10-13 NOTE — Progress Notes (Signed)
Pre-visit discussion using our clinic review tool. No additional management support is needed unless otherwise documented below in the visit note.  

## 2013-10-14 DIAGNOSIS — M546 Pain in thoracic spine: Secondary | ICD-10-CM | POA: Insufficient documentation

## 2013-10-14 NOTE — Assessment & Plan Note (Signed)
Adding vicodin for persistent pain limiting her activity,

## 2013-10-17 ENCOUNTER — Telehealth: Payer: Self-pay | Admitting: Internal Medicine

## 2013-10-17 NOTE — Telephone Encounter (Signed)
Received order for PT but have not been able to contact the pt for therapy visit.  There will be a delay in service until they can reach the pt.

## 2013-10-18 NOTE — Telephone Encounter (Signed)
FYI

## 2013-10-19 ENCOUNTER — Telehealth: Payer: Self-pay | Admitting: *Deleted

## 2013-10-19 ENCOUNTER — Other Ambulatory Visit: Payer: Self-pay | Admitting: Internal Medicine

## 2013-10-19 NOTE — Telephone Encounter (Signed)
Patient grandson left message that the Januvia  With insurance the copay is 259.00 and patient cannot afford this and ask if there is an alternative. Please advise

## 2013-10-20 NOTE — Telephone Encounter (Signed)
Left message to return call to office.

## 2013-10-20 NOTE — Telephone Encounter (Signed)
No problem.  Ask him to check about onglyza and tradjenta which are similar and may be on their formulary which i dont' have.  If they are not covered either we will simply icrease her lantus dose as needed based on blood sugars.

## 2013-10-20 NOTE — Telephone Encounter (Signed)
Patient notified to contact  Insurance to find drug formulary approval will advise ASAP. Patient POA also ask if a letter could be written that patient is to have no cats around or in house with patient due to health condition. Please advise?

## 2013-10-23 NOTE — Telephone Encounter (Signed)
FYI

## 2013-10-23 NOTE — Telephone Encounter (Signed)
Cary called from [facility] still trying to contact patient . Have not be able to talk with anyone.

## 2013-11-06 ENCOUNTER — Telehealth: Payer: Self-pay | Admitting: Internal Medicine

## 2013-11-06 ENCOUNTER — Other Ambulatory Visit: Payer: Self-pay | Admitting: Internal Medicine

## 2013-11-06 DIAGNOSIS — G3184 Mild cognitive impairment, so stated: Secondary | ICD-10-CM

## 2013-11-06 NOTE — Telephone Encounter (Signed)
Called to request refill for hydrocodone.  Please call when ready for pick up.

## 2013-11-06 NOTE — Telephone Encounter (Signed)
Please advise 

## 2013-11-07 MED ORDER — HYDROCODONE-ACETAMINOPHEN 5-325 MG PO TABS: 1.0000 | ORAL_TABLET | Freq: Two times a day (BID) | ORAL | Status: DC | PRN

## 2013-11-07 NOTE — Telephone Encounter (Signed)
Patient notified

## 2013-11-07 NOTE — Addendum Note (Signed)
Addended by: Nanci Pina on: 11/07/2013 01:46 PM   Modules accepted: Orders

## 2013-11-07 NOTE — Telephone Encounter (Signed)
Patient must pick up personally, and once she is here needs to provide a UDS

## 2013-11-08 NOTE — Telephone Encounter (Signed)
Patient came in to pick up script but refused o sign narcotics agreement still have script patient stated she was going to check with POA before signing.

## 2013-11-09 NOTE — Telephone Encounter (Signed)
Patient POA called and ask why patient had to sign a narcotic agreement and why did patient have to take a UDS and advised POA this was the rules of [place] and that all patient receiving narcotic are bound by this agreement. POA seem satisfied with answer.

## 2013-11-10 ENCOUNTER — Other Ambulatory Visit: Payer: Self-pay | Admitting: Internal Medicine

## 2013-11-28 ENCOUNTER — Ambulatory Visit: Payer: 59 | Admitting: Cardiovascular Disease

## 2013-12-06 ENCOUNTER — Ambulatory Visit: Payer: Self-pay | Admitting: Internal Medicine

## 2013-12-07 ENCOUNTER — Ambulatory Visit: Payer: 59 | Admitting: Cardiovascular Disease

## 2013-12-08 ENCOUNTER — Other Ambulatory Visit: Payer: Self-pay | Admitting: Internal Medicine

## 2013-12-14 ENCOUNTER — Ambulatory Visit: Payer: 59 | Admitting: Cardiovascular Disease

## 2013-12-14 ENCOUNTER — Encounter: Payer: Self-pay | Admitting: *Deleted

## 2014-01-10 ENCOUNTER — Other Ambulatory Visit: Payer: Self-pay | Admitting: Internal Medicine

## 2014-01-15 ENCOUNTER — Other Ambulatory Visit: Payer: Self-pay | Admitting: Internal Medicine

## 2014-01-15 NOTE — Telephone Encounter (Signed)
Refill? Not on current med list 

## 2014-01-16 NOTE — Telephone Encounter (Signed)
i assume you are asking about the bottom 3 . .ok to refill

## 2014-02-05 ENCOUNTER — Other Ambulatory Visit: Payer: Self-pay | Admitting: Internal Medicine

## 2014-03-05 ENCOUNTER — Emergency Department: Payer: Self-pay | Admitting: Emergency Medicine

## 2014-03-05 LAB — URINALYSIS, COMPLETE
Bilirubin,UR: NEGATIVE
Blood: NEGATIVE
Glucose,UR: NEGATIVE mg/dL (ref 0–75)
Ketone: NEGATIVE
Nitrite: NEGATIVE
PROTEIN: NEGATIVE
Ph: 5 (ref 4.5–8.0)
RBC,UR: 2 /HPF (ref 0–5)
Specific Gravity: 1.01 (ref 1.003–1.030)
Squamous Epithelial: 1

## 2014-03-05 LAB — CBC
HCT: 34.3 % — ABNORMAL LOW (ref 35.0–47.0)
HGB: 11.4 g/dL — AB (ref 12.0–16.0)
MCH: 30.2 pg (ref 26.0–34.0)
MCHC: 33.1 g/dL (ref 32.0–36.0)
MCV: 91 fL (ref 80–100)
Platelet: 209 10*3/uL (ref 150–440)
RBC: 3.77 10*6/uL — ABNORMAL LOW (ref 3.80–5.20)
RDW: 14.3 % (ref 11.5–14.5)
WBC: 8.1 10*3/uL (ref 3.6–11.0)

## 2014-03-05 LAB — BASIC METABOLIC PANEL
Anion Gap: 11 (ref 7–16)
BUN: 21 mg/dL — ABNORMAL HIGH (ref 7–18)
CALCIUM: 8.1 mg/dL — AB (ref 8.5–10.1)
CHLORIDE: 104 mmol/L (ref 98–107)
CREATININE: 1.4 mg/dL — AB (ref 0.60–1.30)
Co2: 23 mmol/L (ref 21–32)
EGFR (African American): 39 — ABNORMAL LOW
EGFR (Non-African Amer.): 33 — ABNORMAL LOW
GLUCOSE: 113 mg/dL — AB (ref 65–99)
OSMOLALITY: 279 (ref 275–301)
Potassium: 3.8 mmol/L (ref 3.5–5.1)
SODIUM: 138 mmol/L (ref 136–145)

## 2014-03-05 LAB — TROPONIN I: Troponin-I: <0.02 ng/mL

## 2014-03-05 LAB — PRO B NATRIURETIC PEPTIDE: B-Type Natriuretic Peptide: 349 pg/mL (ref 0–450)

## 2014-03-05 LAB — CK TOTAL AND CKMB (NOT AT ARMC)
CK, Total: 69 U/L
CK-MB: 0.6 ng/mL (ref 0.5–3.6)

## 2014-03-12 ENCOUNTER — Other Ambulatory Visit: Payer: Self-pay | Admitting: Internal Medicine

## 2014-03-12 NOTE — Telephone Encounter (Signed)
Review of chart appears pts last several appoints have been for hospital follow ups.  Does not appear to have had CPE in over a year.  Please advise refills

## 2014-03-14 NOTE — Telephone Encounter (Signed)
Ok to refill,  Refills sent

## 2014-03-29 ENCOUNTER — Other Ambulatory Visit: Payer: Self-pay | Admitting: Internal Medicine

## 2014-04-11 ENCOUNTER — Other Ambulatory Visit: Payer: Self-pay | Admitting: Internal Medicine

## 2014-04-21 ENCOUNTER — Other Ambulatory Visit: Payer: Self-pay | Admitting: Internal Medicine

## 2014-04-23 ENCOUNTER — Telehealth: Payer: Self-pay | Admitting: *Deleted

## 2014-04-23 NOTE — Telephone Encounter (Signed)
Fax received from [facility], needing PA on hydroxyzine. PA started and given to Dr. Derrel Nip for signature

## 2014-04-25 NOTE — Telephone Encounter (Signed)
PA signed and faxed to [facility] billing copy made and sent to scan.

## 2014-06-12 ENCOUNTER — Other Ambulatory Visit: Payer: Self-pay | Admitting: Internal Medicine

## 2014-07-09 ENCOUNTER — Ambulatory Visit: Payer: Medicare Other | Admitting: Nurse Practitioner

## 2014-07-11 ENCOUNTER — Other Ambulatory Visit: Payer: Self-pay | Admitting: Internal Medicine

## 2014-08-13 ENCOUNTER — Emergency Department: Payer: Self-pay | Admitting: Emergency Medicine

## 2014-08-13 LAB — BASIC METABOLIC PANEL
Anion Gap: 8 (ref 7–16)
BUN: 20 mg/dL — ABNORMAL HIGH (ref 7–18)
Calcium, Total: 8.4 mg/dL — ABNORMAL LOW (ref 8.5–10.1)
Chloride: 101 mmol/L (ref 98–107)
Co2: 27 mmol/L (ref 21–32)
Creatinine: 1.64 mg/dL — ABNORMAL HIGH (ref 0.60–1.30)
EGFR (African American): 38 — ABNORMAL LOW
EGFR (Non-African Amer.): 31 — ABNORMAL LOW
GLUCOSE: 212 mg/dL — AB (ref 65–99)
Osmolality: 281 (ref 275–301)
POTASSIUM: 4.2 mmol/L (ref 3.5–5.1)
SODIUM: 136 mmol/L (ref 136–145)

## 2014-08-13 LAB — CBC
HCT: 38.5 % (ref 35.0–47.0)
HGB: 12.6 g/dL (ref 12.0–16.0)
MCH: 30.4 pg (ref 26.0–34.0)
MCHC: 32.6 g/dL (ref 32.0–36.0)
MCV: 93 fL (ref 80–100)
PLATELETS: 246 10*3/uL (ref 150–440)
RBC: 4.13 10*6/uL (ref 3.80–5.20)
RDW: 14 % (ref 11.5–14.5)
WBC: 10.5 10*3/uL (ref 3.6–11.0)

## 2014-09-12 ENCOUNTER — Other Ambulatory Visit: Payer: Self-pay | Admitting: Internal Medicine

## 2014-09-13 ENCOUNTER — Other Ambulatory Visit: Payer: Self-pay | Admitting: Internal Medicine

## 2014-10-09 ENCOUNTER — Other Ambulatory Visit: Payer: Self-pay | Admitting: Internal Medicine

## 2014-10-31 ENCOUNTER — Other Ambulatory Visit: Payer: Self-pay | Admitting: Internal Medicine

## 2014-10-31 NOTE — Telephone Encounter (Signed)
Ok to refill,  Refill sent  

## 2014-10-31 NOTE — Telephone Encounter (Signed)
Last visit 10/13/13

## 2014-11-27 NOTE — Op Note (Signed)
PATIENT NAME:  Sheena [name], Sheena [name] MR#:  [id number] DATE OF BIRTH:  [birth year]  DATE OF PROCEDURE:  03/30/2012  PROCEDURE PERFORMED:  1. Pars plana vitrectomy of the right eye.  2. Internal limiting membrane peel of the right eye.   PREOPERATIVE DIAGNOSES:  1. Epiretinal membrane.  2. Retinal edema.   POSTOPERATIVE DIAGNOSES:  1. Epiretinal membrane.  2. Retinal edema.   ESTIMATED BLOOD LOSS: Less than 1 mL.   PRIMARY SURGEON: Garlan Fair,  M.D.   ANESTHESIA: Retrobulbar block of the right eye with monitored anesthesia care.   COMPLICATIONS: None.   INDICATIONS FOR PROCEDURE: This is a patient with slowly decreasing vision in the right eye. Examination revealed an epiretinal membrane and associated retinal edema. The risks, benefits, and alternatives of the above procedure were discussed and the patient wished to proceed.   DETAILS OF PROCEDURE: After informed consent was obtained, the patient was brought into the operative suite at [facility] The patient was placed in the supine position and was given a small dose of Alfenta and a retrobulbar block was performed on the right eye by the primary surgery without any complications. The right eye was prepped and draped in sterile manner. After a lid speculum was inserted, a 25-gauge trocar was placed inferotemporally through displaced conjunctiva 3 mm beyond the limbus. Infusion cannula was turned on and inserted through the trocar and secured in position with Steri-Strips. Two more trocars were placed in a similar fashion superotemporally and superonasally. The vitreous cutter and light pipe were introduced in the eye and a core vitrectomy was performed. The vitreous face was elevated off of the retina using suction and peripheral vitrectomy was performed for 360 degrees. Indocyanine green was injected onto the posterior pole and removed within 30 seconds. The epiretinal membrane and internal limiting membrane were  removed 360 degrees around the fovea. Epiretinal membrane was noted to be extremely thick and adherent and extreme care was taken in slowly removing this from the fovea. No hole could be identified once complete. Scleral depressed exam was performed for 360 degrees and no signs of any breaks or tears could be identified. A 90% air-fluid exchange was performed and the trocars were removed. The wounds were noted to be airtight. Pressure in the eye was confirmed to be approximately 15 mmHg. 5 mg of dexamethasone was given into the inferior fornix and the lid speculum was removed. The eye was cleaned and TobraDex was placed on the eye. A patch and shield were placed over the eye and the patient was taken to postanesthesia care with instructions to remain head up.      ____________________________ Teresa Pelton. Starling Manns, MD [id number] D: 03/30/2012 08:14:13 ET T: 03/30/2012 09:51:58 ET JOB#: [id number]  cc: Teresa Pelton. Starling Manns, MD, <Dictator> Coralee Rud MD ELECTRONICALLY SIGNED 03/30/2012 12:36

## 2014-11-30 NOTE — Discharge Summary (Signed)
PATIENT NAME:  Sheena [name], Sheena [name] MR#:  [id number] DATE OF BIRTH:  1924-02-21  DATE OF ADMISSION:  10/29/2012 DATE OF DISCHARGE:  10/31/2012  DISPOSITION: To [facility].   DISCHARGE DIAGNOSES:  1.  Vasovagal syncope.  2.  Acute renal failure.  3.  Hypertension.  4.  Diabetes mellitus type 2.  5.  Left ankle fracture.   DISCHARGE MEDICATIONS:  1.  Aspirin 81 mg daily.  2.  Tessalon Perle as needed for cough.  3.  Ocuvite 1 tablet p.o. daily.  4.  Claritin 10 mg daily.  5.  Vitamin D 50,000 units once a week.  6.  Advair 100/50, 1 puff b.i.d.  7.  Hydroxyzine as needed for itching.  8.  Lexapro 20 mg daily.  9.  Lipitor 40 mg p.o. daily.  10.  Metformin with sitagliptin 500/50, 1 tablet p.o. b.i.d.  11.  Ambien 5 mg p.o. daily.  12.  Omeprazole 40 mg p.o. daily.  13.  Fish oil 1 gram p.o. daily.  14.  Lipitor 40 mg daily.  15.  Lantus 15 units at bedtime.  16.  Percocet 5/325 every 6 hours p.r.n. as needed for pain.   CONSULTATIONS: Orthopedic consult.   HOSPITAL COURSE: An 81 year old female patient who follows with Dr. Deborra Medina, came in because of syncope. The patient had noticed syncope after she had a bowel movement. She felt lightheaded and was brought in for syncope. The patient did not have any chest pain or trouble breathing. She felt dizzy when she passed out. Her creatinine was found to be 1.8. The patient's past medical history is significant for hypertension, diabetes, GERD and chronic sinusitis. Blood pressure was 168/62 on admission and heart rate 70. Her EKG showed normal sinus, no ST-T changes. On admission, creatinine 1.3. CT head did not show any acute changes. The patient was admitted for syncope, likely vasovagal. Troponins have been negative. She was monitored on telemetry. The patient had no neurological deficits. Her echocardiogram showed _normal systolic ____ function with EF of 65% to 70% with normal wall motion. The patient's troponins have been negative  x 3. The patient was found to have BUN and creatinine on admission of 26 BUN, creatinine 1.83. She was hydrated. Repeat BUN and creatinine did drop to BUN of 18, creatinine 1.41. That was yesterday. The patient did suffer an ankle fracture with ankle swelling when she passed out. Left ankle showed oblique fracture in the distal fibula and avulsion fracture on the tip of the malleolus. The patient had severe pain, and she was seen by orthopedic doctor, Dr. Marry Guan, and she was seen by Dr. Durene Cal, the on-call physician for orthopedic doctor. The patient saw the orthopedic doctor who recommended Ace wrap and Cam walker boot, and also seeing the physical therapist. The patient did not get the Cam walker boot yesterday, and Physical Therapy recommended rehab, so she will be going to [facility] today. The patient needs to see orthopedic doctor, Dr. Marry Guan, in a week, and the patient will continue Cam walker board and weightbearing as tolerated and mobilize with physical therapy. The patient was on her HCTZ at home for high blood pressure, but the dose is not satisfied. We can probably put her on some Norvasc 5 mg daily to help with blood pressure, and she can follow up with Dr. Deborra Medina in about a month for followup. The patient will also have a carotid ultrasound, which was not done as a part of syncope workup, before she  goes to rehab.   TIME SPENT ON DISCHARGE PREPARATION: More than 30 minutes    ____________________________ Epifanio Lesches, MD sk:lo D: 10/31/2012 12:20:17 ET T: 10/31/2012 12:40:35 ET JOB#: [id number]  cc: Epifanio Lesches, MD, <Dictator> Epifanio Lesches MD ELECTRONICALLY SIGNED 11/08/2012 21:40

## 2014-11-30 NOTE — Consult Note (Signed)
Brief Consult Note: Diagnosis: closed min disp L distal fibula fx.   Patient was seen by consultant.   Consult note dictated.   Orders entered.   Comments: nonop tx recommmended, ice/elevate ace wrap cam boot  f/u Dr Marry Guan 1 wk full note dictated.  Electronic Signatures: Maebelle Munroe (MD)  (Signed 22-Mar-14 18:58)  Authored: Brief Consult Note   Last Updated: 22-Mar-14 18:58 by Maebelle Munroe (MD)

## 2014-11-30 NOTE — Op Note (Signed)
PATIENT NAME:  Sheena [name], Sheena [name] MR#:  [id number] DATE OF BIRTH:  1923-08-23  DATE OF PROCEDURE:  02/12/2013  PREOPERATIVE DIAGNOSIS: Closed displaced left distal tibia and fibula fracture with possible distal fibula nonunion.   POSTOPERATIVE DIAGNOSIS:  Closed displaced left distal tibia and fibula fracture with  distal fibula atrophic nonunion.   PROCEDURES PERFORMED:   1.  Left distal fibula open reduction and internal fixation with distal fibula nonunion repair and allograft bone graft.  2.  Subcutaneous plating with ORIF of left distal tibia fracture.   SURGEON:  Maebelle Munroe, MD   ANESTHESIA: Spinal.   COMPLICATIONS: None apparent.   ESTIMATED BLOOD LOSS: 300 mL.  TOURNIQUET TIME: 21 minutes for the fibula and approximately 90 minutes for the tibia.   IMPLANTS: DePuy distal fibular locking plate with assorted 3.5 locking and nonlocking screws and Synthes DBX bone putty, 1 mL, Synthes distal tibial locking plate with 3.5 locking and nonlocking screws.   OPERATIVE FINDINGS: Atrophic distal fibula nonunion with good bone quality.  Small fracture gap after debridement of the attempted fibula healing.   Approximately 3 x 3 area of skin necrosis of the anteromedial distal tibia or the anteromedial distal leg.    ESTIMATED BLOOD LOSS: 300 mL.   DRAINS: None.   SPECIMENS: None.  INDICATIONS: Ms. Seefeld is an 81 year old female who sustained a fall last evening with resultant displaced distal fibula and tibia fracture indicating need for operative treatment. Risks, benefits and alternatives were discussed with her to include but not limited to bleeding, infection, damage to blood vessels and nerves, need for further surgery and treatment, chronic pain, loss of function, stiffness, allergy, anesthetic risk, DVT, PE, increased risk of wound problems, nonunion and infection, given history of diabetes. She appeared to understand the risks, benefits and desired to proceed with the  operative treatment. I did also explain to the patient that with or without surgery the area of skin necrosis over the distal tibia may be problematic to her, including small potential for risk for amputation if she were to have unremitting wound problems and infection, which she appeared to understand. Hospitalist consultation deemed her to be medically optimized for surgical intervention.   DESCRIPTION OF PROCEDURE: After positive identification of the patient in the preoperative holding area, and after informed consent had been obtained, the correct operative site had been initialed by myself, the patient was taken to the operating room and placed in supine  position. IV antibiotic was given before any skin incision had been made, which was ceftriaxone, given her history of UTI preoperatively.  IV antibiotics were repeated at approximately 4 hours.  The tourniquet was placed on the distal thigh. The left lower extremity was prepped and draped in the usual sterile fashion after spinal anesthesia had been successfully administered. Timeout was performed. TED and sequentials were applied to the opposite leg. The left lower extremity was prepped and draped in the usual sterile fashion. A longitudinal incision was made coursing about the distal aspect of the fibula.  Incision was carried sharply through the skin and subcutaneous tissues.  The superficial peroneal nerve was identified and protected throughout the procedure. The fascia was incised over the distal fibula and peroneal musculature.  The acute fracture was stabilized with a single K wire. The distal fibula nonunion was curetted and clamped into an anatomic position, and a single lag screw was placed with excellent fixation using standard AO technique.  After the plate had been applied, this area was filled  in with DBX bone paste and after the wound had been copiously irrigated. A 6-hole DePuy periarticular distal fibular locking plate was then fixated to  the distal fibula. Provisional position was secured with K wires and the plate was secured to the bone proximally with 3.5 nonlocking screws and in the segmental piece of the fibula as well as distally initially with nonlocking screws to get plate apposition to the bone and 3.5 locking screws thereafter. All clamps and K wires were removed.  Biplanar fluoroscopy deemed her to be in anatomic reduction of the distal fibula. Synthes DBX paste was injected into the nonunion site.  The fascia overlying the distal fibula was closed, again protecting the superficial peroneal nerve. The wound was closed in layers with 3-0 PDS and 3-0 nylon in the skin. Attention was then directed toward the distal tibia.  Alignment of the distal tibia was unacceptable after plating of the distal fibula fracture nonunion repair.  Given that all incisions could be kept out of the zone of the necrosis, I opted to proceed with fixation of the tibia given that it could be done in a percutaneous fashion. Limited incisions were made proximally at the proximal most aspect of the plate and in the middle of the plate and just over the medial malleolus. All incisions were kept out of the zone of the necrosis.  The leg was exsanguinated. The tourniquet had been deflated after 21 minutes tourniquet time given the venous tourniquet. Hemostasis was obtained prior to closure of the distal fibula, and copious irrigation was utilized. The saphenous neurovascular bundle was identified anteriorly and protected throughout the procedure. The periosteal elevator was used on the medial face of the distal tibia from the distal incision, the middle incision and the proximal most incision as well. A Synthes tibial locking plate was then slid subcutaneously. Plate placement was assessed under biplanar fluoroscopy and approved. A single 3.5 nonlocking screw was placed.  Plate position was adjusted, and the plate was fixed distally. Next, the distal tibial fragment was  then reduced to the metaphyseal and distal portion of the plate.  Biplanar fluoroscopy deemed there to be a near anatomic reduction. The distal tibial plate was then fixated using initially 3.5 nonlocking screw for plate apposition to the bone and then 4 locking screws, and a lag screw in standard AO fashion was advanced directly across the fracture site from the middle incision. All  wounds were copiously irrigated. The area of necrosis did not widen significantly during the procedure. There was a small fracture blister over the area of necrosis at the end of the procedure. This was left all untouched. All wounds were copiously irrigated. Skin was closed in layers with 3-0 PDS and 3-0 nylon. Xeroform was applied to all incisions. A percutaneous incision had been made with blunt dissection carried out down to the lateral aspect of the fracture anteriorly to clamp the fracture into an anatomic position; 30 mL of 0.25%  bupivacaine without epinephrine was injected proximal to the tibia and fibular    wounds, and an additional 40 mL of 1:1 mix of 20 mL of Exparel and 20 mL of normal saline dilutent for an ankle block for both the Marcaine and the Exparel.  The tourniquet had been deflated, and the short leg splint with the foot in the plantigrade position was placed.  These findings were relayed to her daughter, as well as her helper, Loma Sousa, who appeared to understand the nature of the operation as well as her  need for followup compliance.   Dr. Marry Guan will assume her care tomorrow morning.    ____________________________ Maebelle Munroe, MD jfs:cb D: 02/12/2013 20:57:54 ET T: 02/12/2013 21:16:10 ET JOB#: [id number]  cc: Maebelle Munroe, MD, <Dictator> Maebelle Munroe MD ELECTRONICALLY SIGNED 02/26/2013 14:00

## 2014-11-30 NOTE — Consult Note (Signed)
  DATE OF BIRTH:  23-Sep-1923  DATE OF CONSULTATION:  10/29/2012  CONSULTING PHYSICIAN:  Maebelle Munroe, MD  CHIEF COMPLAINT:  Left ankle pain.   HISTORY OF PRESENT ILLNESS:  The patient is an 81 year old female who was sitting on the commode, and suddenly became sweaty and lightheaded, and  slid off the commode onto her left lower extremity. She was brought to [facility] Emergency Department, where she was diagnosed with a distal fibula fracture. I was asked to provide opinion for management of the fracture.   She complains of sharp, intermittent pain diffusely in the left ankle.   PAST MEDICAL HISTORY:  Remarkable for hypertension, diabetes mellitus, osteoarthritis, GERD, and chronic sinusitis.   PAST SURGICAL HISTORY: Remarkable for hysterectomy and hip replacement. Also melanoma excision left arm.   CURRENT MEDICATIONS INCLUDE: Ambien, Quetiapine, omeprazole, Ocuvite, multivitamin, metformin, Lipitor, Lexapro, Lantus, hydroxyzine, hydrochlorothiazide, fluticasone, Salmeterol, fish oil, ergocalciferol,  Claritin, calcium, aspirin, acetaminophen.   SOCIAL HISTORY:  No smoking or alcohol use. Currently lives with daughter, and has a caregiver.  REVIEW OF SYSTEMS:  Please see HPI.   PHYSICAL EXAMINATION: GENERAL:  A pleasant, alert, elderly female appearing stated age.  PSYCHIATRIC:  Mood and affect appropriate.  VITAL SIGNS: On presentation to the ward demonstrate a temperature of 98.1, pulse of 87, respirations 18, blood pressure 102/50, with 90% room air saturation.  SKIN EXAMINATION:  Warm and intact in the left ankle region.  LYMPHATIC EXAMINATION:  Moderate swelling left ankle.  NEUROLOGIC EXAMINATION:  Motor and light touch sensation examination intact to the left foot.  VASCULAR EXAMINATION:  2+ dorsalis pedis and posterior tibialis pulse.   MUSCULOSKELETAL: Focused musculoskeletal examination of the left ankle and foot demonstrates moderate tenderness to  palpation over the distal fibula, and mild to moderate tenderness to palpation over the deltoid ligament. There is a negative proximal squeeze test. The foot is nontender to palpation. There is no deformity. There is moderate swelling and mild ecchymosis about the left ankle.   DIAGNOSTIC DATA:  Radiographs reveal a minimally displaced distal fibula fracture, with symmetric mortise and syndesmotic relationships.   IMPRESSION:  Closed displaced left distal fibula fracture, SER2 pattern.   PLAN:  Ace wrap, Cam Walker boot, weight-bearing as tolerated, mobilize with physical therapy. TED and sequentials to be applied to the opposite leg, and followup in the office with Dr. Marry Guan in approximately one week, or sooner if questions, concerns or problems. I will be signing off the case,  in that there is no operative management needed for this patient.      ____________________________ Maebelle Munroe, MD jfs:mr D: 10/29/2012 18:57:17 ET T: 10/29/2012 19:22:38 ET JOB#: [id number]  cc: Maebelle Munroe, MD, <Dictator> Maebelle Munroe MD ELECTRONICALLY SIGNED 11/12/2012 13:02

## 2014-11-30 NOTE — H&P (Signed)
PATIENT NAME:  Sheena [name], Sheena [name] MR#:  [id number] DATE OF BIRTH:  [birth year]  DATE OF ADMISSION:  02/12/2013  PRIMARY CARE PHYSICIAN: Dr. Deborra Medina   REFERRING PHYSICIAN: Dr. Alric Ran   CHIEF COMPLAINT: Left ankle fracture status post fall.   HISTORY OF PRESENT ILLNESS:  Sheena [name] is an 81 year old Caucasian female who was in her usual state of health. Today, she had an accident while she was walking in the kitchen.  She tripped, and when her foot caught by the chair, fell to the floor, resulting in left ankle fracture. She fractured the distal tibia and fibula. The patient was transferred to the Emergency Department for evaluation. She was seen by Dr. Alric Ran as part of orthopedic consultation. He is planning to operate on the patient tomorrow. In the interim, the patient is here, denies any other discomfort other than the pain in the left ankle.   REVIEW OF SYSTEM: CONSTITUTIONAL: Denies any fever. No chills. No fatigue.  EYES: No blurring of vision. No double vision.  ENT: No hearing impairment. No sore throat. No dysphagia.  CARDIOVASCULAR: No chest pain. No shortness of breath. No syncope.  RESPIRATORY: No cough. No sputum production. No chest pain.  GASTROINTESTINAL: No abdominal pain, no vomiting, no diarrhea.  GENITOURINARY: No dysuria. No frequency of urination, although her urine appears to be infected.  MUSCULOSKELETAL: Apart from the left ankle pain, she has no other muscular pain or swelling. INTEGUMENTARY: No skin rash. No ulcers.  NEUROLOGY: No focal weakness. No seizure activity. No headache.  PSYCHIATRY: No anxiety, but she has history of bipolar disorder and possibly schizophrenia.  ENDOCRINE: No polyuria or polydipsia. No heat or cold intolerance.   PAST MEDICAL HISTORY: Systemic hypertension, history of syncope in March of this year, diabetes mellitus type 2, chronic kidney disease stage 3 to stage 4 with a creatinine ranging between 1.4 to 1.8, gastroesophageal  reflux disease, osteoarthritis, chronic sinusitis, bipolar manic depressive illness. There is a question whether she has schizophrenia; one daughter states that she does have that; the other is objecting about this diagnosis.   PAST SURGICAL HISTORY: Hysterectomy, hip replacement and removal of melanoma from the left arm.   SOCIAL HABITS:  Nonsmoker. No history of alcohol or drug abuse.   FAMILY HISTORY: Both parents had diabetes mellitus and her father died from complications of diabetes. Her mother suffered from coronary artery disease and diabetes. She has a daughter at the age of 98 had transient ischemic attack.   SOCIAL HISTORY: She is widowed. Lives at home with her one of her daughters.   ADMISSION MEDICATIONS: Aspirin 81 mg a day, Claritin 10 mg a day, Advair 100/50 one inhalation twice a day, calcium 500 mg once a day, vitamin D 50,000 units once a week, hydroxyzine 25 mg 3 times a day and recently started on Seroquel 10 mg at night, Lexapro 20 mg a day, Lipitor 40 mg a day, metformin with sitagliptin 500/50 twice a day, Ambien 5 mg at night, omeprazole 40 mg a day, Lantus 15 units at bedtime.   ALLERGIES: No known drug allergies.   PHYSICAL EXAMINATION: VITAL SIGNS: Blood pressure 133/59, respiratory rate 18, pulse 79, temperature 98.6, oxygen saturation 96%.  GENERAL APPEARANCE: Elderly female lying in bed, in no acute distress.  HEAD AND NECK: No pallor. No icterus. No cyanosis.  EENT: Ear examination revealed normal hearing, no discharge, no ulcers. Examination of the mouth, lips, tongue were normal. No ulcers. No oral thrush. Eye examination revealed normal eyelids and  conjunctivae.  NECK: Supple. Trachea at midline. No thyromegaly. No cervical lymphadenopathies.  HEART: Normal S1, S2. No S3, S4. No murmur. No gallop. No carotid bruits.  RESPIRATORY: Revealed normal breathing pattern without use of accessory muscles. No rales. No wheezing.  ABDOMEN: Soft without tenderness. No  hepatosplenomegaly. No masses. Obese. No hernias.  SKIN: No ulcers. No subcutaneous nodules.  EXTREMITIES: There is ecchymosis and swelling of the left ankle area, site of the fracture.   MUSCULOSKELETAL: Soft tissue swelling and deformity of the left foot which is externally rotated. No other joint swelling or no clubbing of fingers.  NEUROLOGIC: Cranial nerves II through XII are intact. No focal motor deficit.  PSYCHIATRIC: The patient is alert, oriented to place and people.   LABORATORY FINDINGS: EKG is not done. Chest x-ray was not done. X-ray of the left ankle showed distal tibia and fibula fracture. X-ray of the AP and pelvis: There is a question whether there is pelvic fracture. Serum glucose 117, BUN 23, creatinine 1.6, sodium 141, potassium 4. Estimated GFR is 28. CBC showed white count of 13,000, hemoglobin 11, hematocrit 34, platelet count 197. ProTime 12, INR 0.9, PTT 26. Urinalysis showed cloudy urine, positive nitrite, 215 white blood cells and bacteria +1.   ASSESSMENT: 1.  Left distal tibia and left fibular fracture, status post fall.  2.  Urinary tract infection.  3.  Systemic hypertension.  4.  Diabetes mellitus, type 2.  5.  Chronic kidney disease, stage 4.   6.  History of gastroesophageal reflux disease. 7.  Bipolar manic depressive illness and question whether she has schizophrenia.   PLAN: I asked the Emergency Department physician to order an EKG and portable chest x-ray. I will send urine for culture. I will start her on Rocephin 1 gram daily to cover for urinary tract infection. Keep the patient n.p.o. except for her medications. I will continue the same home medications, but I will discontinue metformin in the face of elevated creatinine. Accu-Chek on sliding scale. I will reduce her Lantus to 10 units until she is back eating as usual, then we can resume the full dose. The patient does have a living will. Her CODE STATUS is FULL CODE. Her daughter tells me that  previously she was do not resuscitate status, but thereafter her mother had changed her mind.   Time spent in evaluating this patient took more than 55 minutes, including reviewing her medical records and discussion with the daughters.    ____________________________ Clovis Pu. Lenore Manner, MD amd:nts D: 02/12/2013 04:37:44 ET T: 02/12/2013 04:53:41 ET JOB#: [id number]  cc: Clovis Pu. Lenore Manner, MD, <Dictator> Ellin Saba MD ELECTRONICALLY SIGNED 02/13/2013 6:07

## 2014-11-30 NOTE — Consult Note (Signed)
PATIENT NAME:  Sheena [name], Sheena [name] MR#:  [id number] DATE OF BIRTH:  04/11/1924  ORTHOPEDICS CONSULTATION  DATE OF EVALUATION:  02/12/2013  CHIEF COMPLAINT: Left ankle pain.   HISTORY OF PRESENT ILLNESS: Ms Edinger is an 81 year old female who fell in the kitchen last evening, sustaining a displaced left distal tibia and fibula fracture. She was admitted by the hospitalist service, who feel that she is medically optimized for surgical intervention. She complains of sharp pain intermittently in the left ankle.   She is a known diabetic.   PAST MEDICAL HISTORY: Remarkable for diabetes mellitus, vitamin D deficiency, environmental allergies, anxiety disorder, hypercholesterolemia, insomnia and depression. Past medical history also remarkable for diabetic nephropathy.   PAST SURGICAL HISTORY: Remarkable for left total hip replacement, hysterectomy and left arm melanoma excision.   SOCIAL HISTORY: Lives at home with her daughter and 2 young females who intermittently provide assistance to her. She is a widow and denies any alcohol or tobacco use.   CURRENT ADMISSION MEDICATIONS: Include Lantus, omeprazole, metformin, Lipitor, Lexapro, Seroquel, hydroxyzine, vitamin D, calcium, aspirin, Claritin, Advair.   ALLERGIES: No known drug allergies.   PHYSICAL EXAMINATION: GENERAL: Pleasant, alert, elderly female appearing stated age, presenting with a female helper, Loma Sousa, today.  VITAL SIGNS: On presentation to the ward show a temperature of 98.2, pulse of  87, respirations of 20, blood pressure 112/71, 2 liters 95% oxygen saturation on 2 liters of oxygen.  PSYCHIATRIC: Mood appropriate. Affect slightly blunted.   SKIN: Is warm and intact over the left ankle region. There is a 3 x 4 cm area of ecchymosis over the medial ankle though the skin itself is intact. The lateral ankle skin is completely intact.  HEENT: Normocephalic, atraumatic. Sclerae clear. Oral mucosa membranes are slightly dry.   LYMPHATIC: Moderate swelling of the left ankle.  VASCULAR: Showing 1+ dorsalis pedis and posterior tibialis pulse.  NEUROLOGIC: Motor and light touch sensation examination intact to the left ankle.  ABDOMEN: Soft, nondistended. Positive bowel sounds.  HEART: Regular rate and rhythm. Normal S1, S2.  LUNGS: Clear to auscultation bilaterally except for mild expiratory wheezing bilaterally.  MUSCULOSKELETAL: Bilateral upper extremities and right lower extremity nontender to palpation. Left lower extremity reveals obvious pain and mild deformity of the left ankle, with  marked tenderness to palpation over the distal tibia and fibula.  SKIN: Warm and intact over the ankle region. The remainder of the ipsilateral left lower extremity is nontender to palpation, including the hip region.   Radiographic review reveals a distal tibia and fibula segmental fibula fracture.   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 The distal tibia has mild-to-moderate comminution in a spiral fracture morphology.   There is also a question of a left anterior pelvic ring injury on the left, which upon evaluation of the left AP pelvis reveals total hip components in place on the left hip.   CT of the pelvis reveals no fracture or dislocation. Findings consistent with prior left total hip replacement.   UA on admission is consistent with a urinary tract infection. She was given a dose of Rocephin in the Emergency Department.   IMPRESSION: Closed, displaced left distal tibia and fibula fracture, with unacceptable alignment.  UTI  PLAN: Given the comminuted nature of this fracture as well as the fact that the fracture occurs at the same level, operative treatment is indicated. Given the distal  nature of the tibia fracture, a  subcutaneous plating would best manage the fracture. I  did explain to the patient that we will addressed the fibula first and if the skin is too compromised over the region of the fracture in that area needs to be opened for plating we will either delay operative treatment of her tibia for 1 to  2 weeks, or treat her tibia nonoperatively assuming that the alignment is acceptable.   Risks, benefits and alternatives were discussed with her to include, but not limited to, bleeding, infection, damage to blood vessels and nerves, need for further surgery and treatment, chronic pain, loss of function, stiffness, allergy, anesthetic risk, DVT, PE, heart, lung, brain, and kidney complications especially given prior history of chronic kidney disease and diabetes, with increased risk of nonunion, wound problems, and infection given history of diabetes.   ASSESSMENT:  1.  Closed displaced left distal tibia and fibula fracture. 2. 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  Acute urinary tract infection.   PLAN: Will give her a second dose of ceftriaxone as well preoperatively.   DVT and PE were also discussed with the patient. We will proceed with fixation of at least the fibula today, and hopefully the tibia if her soft tissues will tolerate it. possible need for distal fibula nonunion  repair.   Addendum: in discussion with the pt's daugther, the pt had sustained a fall within the past 6 months with resultant distal fibula fracture.   ____________________________ Maebelle Munroe, MD jfs:dm D: 02/12/2013 13:50:53 ET T: 02/12/2013 14:27:19 ET JOB#: [id number]  cc:   Maebelle Munroe MD ELECTRONICALLY SIGNED 02/12/2013 20:45

## 2014-11-30 NOTE — H&P (Signed)
PATIENT NAME:  Sheena [name], Sheena [name] MR#:  [id number] DATE OF BIRTH:  1923/11/25  DATE OF ADMISSION:  10/29/2012  PRIMARY CARE PHYSICIAN: Deborra Medina, MD   REFERRING PHYSICIAN: Blair Heys. Cheri Guppy, MD  CHIEF COMPLAINT: Syncope.   HISTORY OF PRESENT ILLNESS: The patient is an 81 year old obese white female, who was brought to the Emergency Department after having an episode of syncope and a fall. The patient went to toilet to have a bowel movement. After having a bowel movement, she started to experience lightheadedness and started feeling dizzy and fell down to the floor. When the patient's daughter saw her, her legs were tangled. Was well conscious at the time. However, had 1 large vomitus with green phlegm. The patient also had a big bowel movement. Considering this, the patient was brought to the Emergency Department. On workup in the Emergency Department, the patient was found to have elevated creatinine of 1.8. We do not have the patient's baseline. The patient was also found to have left malleolar fracture. At baseline, the patient's functional status is poor. Walks to the bathroom 3 to 4 times a day. Most of the time, remains in the bed. Usually has a good appetite and having regular bowel movements. Denies any dysuria. The patient denied having any chest pain at that time. The patient is able to provide all the history.   PAST MEDICAL HISTORY:  1. Hypertension.  2. Diabetes mellitus, insulin dependent.  3. Osteoarthritis.  4. Gastroesophageal reflux disease.  5. Chronic sinusitis.   PAST SURGICAL HISTORY:  1. Hysterectomy.  2. Hip replacement.  3. Melanoma removal from the left arm.   ALLERGIES: No known drug allergies.   HOME MEDICATIONS:  1. Ambien 5 mg once a day.  2. Quetiapine 50 mg once a day.  3. Omeprazole 40 mg once a day.  4. Ocuvite 1 tablet once a day.  5. Multivitamin 1 tablet once a day.  6. Metformin/sitagliptin 2 times a day.  7. Lipitor 40 mg once a day.  8. Lexapro  20 mg once a day.  9. Lantus 15 units once a day.  10. Hydroxyzine 1 tablet 3 times a day.  11. Hydrochlorothiazide 1 tablet once a day.  12. Fluticasone/salmeterol 1 puff inhaled 2 times a day.  13. Fish oil 1000 mg once a day.  14. Ergocalciferol 1 capsule once a day.  15. Claritin 10 mg once a day.  16. Calcium carbonate 1 capsule 2 times a day.  17. Aspirin 1 tablet once a day.  18. Acetaminophen tablet every 6 hours as needed.   SOCIAL HISTORY: No history of smoking, drinking alcohol or using illicit drugs. Lives with her daughter and has a caregiver.   FAMILY HISTORY: Per patient and daughter, no obvious health problems run in the family.   REVIEW OF SYSTEMS:  CONSTITUTIONAL: Has generalized weakness.  EYES: No change in vision or eye discharge.  ENT: No sore throat, change in hearing or tinnitus.  RESPIRATORY: No cough, shortness of breath.  CARDIOVASCULAR: No chest pain, no pedal edema.  GASTROINTESTINAL: No nausea, vomiting or diarrhea. No abdominal pain.  GENITOURINARY: No increased frequency or urgency of urination. No hematuria. ENDOCRINE: No polyuria or polydipsia.  NEUROLOGIC: No weakness or numbness in any part of the body.  SKIN: No rash or lesions.   MUSCULOSKELETAL: Has generalized body aches.   PHYSICAL EXAMINATION:  GENERAL: This is a well-built, well-nourished, age-appropriate female lying down in the bed, not in distress.  VITAL SIGNS: Temperature 97.9, pulse  77, blood pressure 168/62, respiratory rate of 70 and oxygen saturation is 95% on room air.  HEENT: Head normocephalic, atraumatic. Eyes: No scleral icterus. Conjunctivae normal. Pupils equal and reactive to light. Mucous membranes moist.  NECK: Supple. No lymphadenopathy. No JVD. No carotid bruit.  CHEST: No focal tenderness.  LUNGS: Bilaterally clear to auscultation.  HEART: S1 and S2 regular. No murmurs are heard.  ABDOMEN: Bowel sounds present. Soft, nontender, nondistended.  EXTREMITIES: Left  ankle is swollen with decreased range of motion.  SKIN: No rash or lesions.   ELECTROCARDIOGRAM, 12-LEAD: Normal sinus rhythm with no ST-T wave abnormalities.   LABORATORIES: BUN 26, creatinine of 1.3. The rest of the values are within normal limits. Troponins are negative CBC: WBC of 9.3, hemoglobin 11.7, platelet count of 222.   CT HEAD WITHOUT CONTRAST: No acute intracranial abnormality.   ASSESSMENT AND PLAN: The patient is an 81 year old female who comes to the Emergency Department after having an episode of syncope.   1. Syncope. This seems to be more of a vasovagal after having a bowel movement. However, considering her age and medical problems, cannot exclude cardiac causes. Will obtain cardiac enzymes x3. Will obtain echocardiogram. Admit the patient to the monitored bed. Will also obtain carotid Dopplers. The patient does not have any neurologic deficits. Will not obtain any MRI of the brain.  2. Acute renal insufficiency. We do not know the patient's baseline. Will treat it as acute. Continue with IV fluids and follow up. This could be from the drop in blood pressure at the time of syncope.  3. Diabetes mellitus. Continue with Lantus; however, will hold the metformin/sitagliptin considering the patient's renal insufficiency.  4. Hypertension, currently well controlled.  5. Left malleolar fracture. Will consult orthopedic surgery. Until then, will keep the patient on nonweightbearing. Will also involve physical therapy and occupational therapy.  6. Obesity. Will need further counseling.  7. Keep the patient on deep vein thrombosis prophylaxis with Lovenox.   TIME SPENT: 50 minutes.    ____________________________ Monica Becton, MD pv:OSi D: 10/29/2012 06:18:48 ET T: 10/29/2012 12:09:18 ET JOB#: [id number]  cc: Monica Becton, MD, <Dictator> Deborra Medina, MD Monica Becton MD ELECTRONICALLY SIGNED 11/01/2012 1:30

## 2014-11-30 NOTE — Discharge Summary (Signed)
PATIENT NAME:  Sheena [name], Sheena [name] MR#:  [id number] DATE OF BIRTH:  1924-03-11  DATE OF ADMISSION:  02/12/2013 DATE OF DISCHARGE:  02/15/2013  ADMITTING DIAGNOSIS:  Bimalleolar left ankle fracture.   DISCHARGE DIAGNOSIS:  Bimalleolar left ankle fracture.    ADMITTING PHYSICIAN:  Dr. Lenore Manner  CONSULTING PHYSICIAN:  Dr. Mardi Mainland, [place] for the weekend.   HISTORY: The patient is an 81 year old female who states that on day of admission she was walking in her kitchen. She tripped when her foot became caught by a chair and subsequently fell to the floor. She was unable to bear weight due to the severity of discomfort. She subsequently was brought to [facility], where x-rays were taken. 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 After discussion of the risks and benefits of surgical intervention, the patient expressed her  understanding of risks and benefits of surgical intervention. The patient was taken for surgery by Dr. Lenore Manner.   HOSPITAL COURSE:    PROCEDURES: 1.  Left distal fibular open reduction and internal fixation of distal fibula nonunion repair and allograft bone graft.  2.  Subcutaneous plating with ORIF of left distal tibia fracture.   IMPLANTS UTILIZED: DePuy distal fibula locking plate with assorted 3.5 locking and nonlocking screws and Synthes DBX bone putty, 1 mm, Synthes distal tibial locking plate with 3.5 locking and nonlocking screws.   The patient tolerated the procedure very well. She had no complications. She was then taken to the PACU where she was stabilized then transferred to the orthopedic floor. The patient began receiving anticoagulation therapy of enteric-coated aspirin 325 mg per day. The left leg was elevated on pillows. The right leg was elevated off the bed using rolled towels. She was fitted with TED stockings on the nonoperative leg. This was allowed to be removed 1 hour per  8-hour shift. The patient was fitted with the Polar Care day 2 following that surgery. AVI compression foot pumps were applied to the nonsurgical leg. Her calf from the right side had been nontender. There has been no evidence of any deep venous thromboses. Negative Homans sign.   The patient has denied any chest pain or shortness of breath. Vital signs have been stable. She has been afebrile. Hemodynamically, she was stable. No transfusions were needed. The patient did have a urinalysis performed in the ER prior to her surgery with the results not being available until the day of discharge. The [facility] showed preliminary results with urine of Shigella specimen. This is a specimen that is not usually seen in the urine. She denied any diarrhea or any recent aid. The specimen was being sent to the state lab for confirmation. She has had no symptoms of urinary tract infection. This was not a result of catheterization. This was done prior to her surgery.   Physical therapy was initiated on day 1 for gait training and transfers. This has been extremely slow secondary to the fact that she has been nonweightbearing to the left lower extremity. She pretty much has been bed-to-chair transfers. Occupational therapy was also initiated on day 1 for ADL and assistive devices.   The patient's dressing was changed on day 2. The wounds were looking very good. Minimal swelling. She was noted to have a lot of bruising, but not a lot of swelling. She has normal sensation to touch. Capillary refills were intact and within normal limits. She was able to move toes and foot well.  Strength was not examined on this date.   The patient is being discharged to skilled nursing facility in improved stable condition. She has been nonweightbearing to the affected side. She is to elevate the left leg on 1 to 2 pillows and rolled towels under the right heel. TED stockings to the nonoperative leg may be removed 1 hour per 8-hour shift.  Incentive spirometer q.1 hour while awake. Encouraged  IS q.2  hours while awake. She is placed on an ADA diet.  Polar Care to the surgical leg maintaining a temperature of 40 to 50 degrees Fahrenheit. The wound is to stay dry. Call the clinic if any temperatures of 101.5 or greater.  PT for gait training and transfers. OT for ADLs and assistive devices. She is not to take a shower and get the wound wet until the staples are removed. She will need to be seen in the [facility] in 7 to 10 days. An appointment will need to be made.   DRUG ALLERGIES: No known drug allergies.   MEDICATIONS:  Norvasc 5 mg daily, atorvastatin 40 mg daily, calcium carbonate 2 tablets 500 mg b.i.d., Lexapro 20 mg daily, Levaquin 250 mg daily for five days, multivitamin tablet 1 tablet per day, Senokot-S 1 tablet b.i.d., enteric coated aspirin 325 mg b.i.d., Lantus insulin 15 units subcutaneous at bedtime, Novolin R sliding scale, Januvia 100 mg daily, Advair Diskus 500/50 inhaler 1 puff b.i.d., Norco 325/7.5 one to two tablets every 4 to 6 hours p.r.n., Dulcolax suppositories 10 mg rectally daily p.r.n., Atarax 25 mg t.i.d.  p.r.n., lactulose 10 grams/15 mL syrup 30 mL b.i.d., milk of magnesia 30 mL b.i.d., tramadol 50 to 100 mg q.4 to 6 hours p.r.n., Ambien 5 mg at bedtime p.r.n.   PAST MEDICAL HISTORY: Systemic hypertension, history of syncope in March of this year, diabetes mellitus type 2, chronic kidney disease stage III to stage IV with creatinine ranging between 1.4 to 1.8, gastroesophageal reflux disease, chronic sinusitis, bipolar manic depressive illness.    ____________________________ Vance Peper, PA jrw:cc D: 02/15/2013 14:58:00 ET T: 02/15/2013 15:39:04 ET JOB#: [id number]  cc: Vance Peper, PA, <Dictator> Terena Bohan PA ELECTRONICALLY SIGNED 02/19/2013 21:29

## 2014-12-01 NOTE — H&P (Signed)
PATIENT NAME:  Sheena [name], Sheena [name] MR#:  [id number] DATE OF BIRTH:  12-16-1923  DATE OF ADMISSION:  08/22/2013  PRIMARY CARE PHYSICIAN: Dr. Deborra Medina.   REFERRING PHYSICIAN: Dr. Owens Shark.   CHIEF COMPLAINT: Syncope.   HISTORY OF PRESENT ILLNESS: Sheena [name] is an 81 year old, pleasant, white female with a past medical history of diabetes mellitus, hypertension, chronic kidney disease. Was having dinner at a restaurant. After was through eating, started to experience nausea. The patient leaned forward and was resting; however, fell off of the chair and hit her head. The patient was somewhat confused for about 2 minutes and regained consciousness. The patient's caregivers were by her side who witnessed this event. Did not have any seizure episode. After passing out, the patient had an episode of vomiting. The patient had similar episodes occur 2 times in the past. Initial workup in the Emergency Department with EKG and cardiac enzymes was unremarkable. CT head without contrast did not show any intracranial abnormality. There is a frontal scalp hematoma. EKG was unremarkable.   PAST MEDICAL HISTORY:  1. Hypertension.  2. Hyperlipidemia.  3. Diabetes mellitus.  4. Osteoarthritis.  5. Morbid obesity.  6. Gastroesophageal reflux disease.   7. Chronic kidney disease.  8. Bipolar manic depressive illness.  9. Chronic sinusitis.   PAST SURGICAL HISTORY:  1. Hysterectomy.  2. Hip replacement.  3. Removal of melanoma from the left arm.   ALLERGIES: No known drug allergies.   HOME MEDICATIONS:  1. Tessalon Perles 200 mg 3 times a day.  2. Quetiapine 50 mg 2 times a day.  3. Polyethylene glycol as needed.  4. Omeprazole 40 mg once a day.  5. Ocuvite 1 tablet once a day.  6. Naprosyn 2 tablets once a day.  7. Multivitamin 1 tablet 2 times a day.  8. Metformin/  500/50 one tablet 2 times a day.  9. Loratadine 10 mg once a day.  10. Lexapro 20 mg once a day.  11. Januvia 100 mg once a day.   12. Hydroxyzine 1 tablet 3 times a day.  13. Losartan/hydrochlorothiazide 50/12.5 mg once a day.  14. Fish oil 1 capsule once a day.  15. Docusate sodium 1 capsule 2 times a day.  16. Atorvastatin 40 mg once a day.  17. Aspirin daily.  18. Advair Diskus 1 puff 2 times a day.   SOCIAL HISTORY: No history of smoking, drinking alcohol or using illicit drugs. Lives with her daughter and other caregivers.   FAMILY HISTORY: Both parents had diabetes mellitus. Father died from complications of diabetes. Mother suffered from coronary artery disease and diabetes mellitus.   REVIEW OF SYSTEMS:  CONSTITUTIONAL: Denies any generalized weakness.  EYES: No change in vision.  EARS, NOSE, THROAT: No change in hearing, sore throat, runny nose.  RESPIRATORY: No cough, shortness of breath.  CARDIOVASCULAR: No chest pain, palpitations. No pedal edema.  GASTROINTESTINAL: No  abdominal pain.  GENITOURINARY: No dysuria or hematuria.  SKIN: No rash or lesions.  MUSCULOSKELETAL: Has history of osteoarthritis.  NEUROLOGIC: No weakness or numbness in any part of the body.   PHYSICAL EXAMINATION:  GENERAL: This is a well-built, well-nourished, morbidly obese female lying down in the bed, not in distress.  VITAL SIGNS: Temperature 97.5, pulse 75, blood pressure 122/47, respiratory rate of 16, oxygen saturation is 98% on room air.  HEENT: Head normocephalic, atraumatic. There is no scleral icterus. Conjunctivae normal. Pupils equal and react to light. Extraocular movements are intact. Mucous membranes: Mild dryness. No  pharyngeal erythema.  NECK: Supple. No lymphadenopathy. No JVD. No carotid bruit.   CHEST: Has no focal tenderness.  LUNGS: Bilaterally clear to auscultation.  HEART: S1, S2 regular. No murmurs are heard.  ABDOMEN: Bowel sounds present. Soft, nontender, nondistended.  EXTREMITIES: No pedal edema. Pulses 2+.  NEUROLOGIC: The patient is alert, oriented to place, person and time. Cranial nerves II  through XII intact. Motor 5/5 in upper and lower extremities. No sensory deficits.  SKIN: Has frontal scalp hematoma, bruising. No rash or lesions.  MUSCULOSKELETAL: Good range of motion in all of the extremities.   LABORATORIES: Troponin I negative.   CMP: BUN 24, creatinine of 1.6.   Chest x-ray, 1 view portable: No acute disease.   CT head without contrast: No acute intracranial abnormality.   UA negative for nitrites and leukocyte esterase.   ASSESSMENT AND PLAN: Sheena [name] is an 81 year old female who comes to the Emergency Department after having an episode of syncope.  1. Syncope: Most likely this is vasovagal; however, considering the patient's age and comorbid conditions, cannot exclude a transient ischemic attack or possible arrhythmias. Admit the patient to a monitored bed. Cycle cardiac enzymes x 3. Obtain MRI of the brain, carotid Dopplers and echocardiogram.  2. Diabetes mellitus: Continue with home medications. Will keep the patient on sliding scale insulin.  3. Hypertension: Will hold the hydrochlorothiazide. Continue with the losartan.  4. Morbid obesity: Will need counseling.  5. Deep vein thrombosis prophylaxis with Lovenox.   TIME SPENT: 45 minutes.   ____________________________ Monica Becton, MD pv:gb D: 08/23/2013 04:42:12 ET T: 08/23/2013 05:26:59 ET JOB#: [id number]  cc: Monica Becton, MD, <Dictator> Deborra Medina, MD Monica Becton MD ELECTRONICALLY SIGNED 09/03/2013 21:17

## 2014-12-01 NOTE — Discharge Summary (Signed)
PATIENT NAME:  Sheena [name], Sheena [name] MR#:  [id number] DATE OF BIRTH:  06/21/1924  DATE OF ADMISSION:  08/22/2013 DATE OF DISCHARGE:  08/23/2013  ADMISSION DIAGNOSIS: Syncope.    DISCHARGE DIAGNOSES: 1.  Syncope likely vasovagal reaction.  2.  Diabetes.  3.  Morbid obesity.   CONSULTATIONS: None.   IMAGING:  1.  The patient underwent a CT of the head, which showed no evidence of any intracranial hemorrhage or CVA.  2.  MRI of the brain showed no acute abnormality.  3.  Carotid ultrasound showed no hemodynamically significant stenosis.  4.  A 2-D echocardiogram showed normal ejection without any valvular abnormalities to cause her syncope.   HOSPITAL COURSE: This is an 81 year old female with a history of diabetes, who was at lunch and after lunch apparently had a syncopal episode. No further details, please refer to the H and P.  1. Syncope. The patient was admitted the hospitalist service. She was placed on telemetry unit. She underwent a carotid Doppler, MRI and  echo; all of which essentially were normal. She had no evidence of telemetry monitoring. Cardiac enzymes are normal. I suspect this is likely a vasovagal reaction; however, given her history of diabetes and morbid obesity, I did discharge the patient with a Holter monitor. We also checked orthostatics and she was not orthostatic. The patient will follow up in cardiology after her 48 hour Holter.  2.  Diabetes.  The patient will continue her  outpatient medications.  3.  Hypothyroidism. The patient will continue her Synthroid.  4.  Morbid obesity. We encouraged weight loss as tolerated and exercise as tolerated.   DISCHARGE MEDICATIONS: 1.  Ocuvite vitamin, 1 tablet.  2. Lexapro 20 mg daily 3.  Metformin/sitagliptin 500/50 b.i.d.  4.  Advair Diskus 100/50  b.i.d.  5.  Multivitamin 1 tablet b.i.d.  6.   Fish Oil 1000 mg daily.  7.  Docusate 100 mg t.i.d.  8.  Loratadine 10 mg daily.  9. HCTZ/losartan  12.5/50 daily.  10.   Aspirin 81 mg daily.  11.  Quetiapine 50 mg b.i.d.  12.  Omeprazole 40 mg.  13.  Atorvastatin 40 at bedtime.  14.  Januvia 100 mg daily.   DISCHARGE DIET: Low sodium, low fat, low cholesterol, ADA diet.   DISCHARGE ACTIVITY: As tolerated.   DISCHARGE FOLLOW-UP:  The patient will follow up with [facility] in two weeks after the Holter monitor. The patient will follow up with Dr. Derrel Nip.  TIME SPENT: 35 minutes. The patient was medically stable for discharge.    ____________________________ Mamye Bolds P. Benjie Karvonen, MD spm:cc D: 08/23/2013 21:18:57 ET T: 08/23/2013 21:40:24 ET JOB#: [id number]  cc: Kismet Facemire P. Benjie Karvonen, MD, <Dictator> Deborra Medina, MD [place] P Rodric Punch MD ELECTRONICALLY SIGNED 08/24/2013 19:27

## 2014-12-10 ENCOUNTER — Other Ambulatory Visit: Payer: Self-pay | Admitting: Internal Medicine

## 2014-12-10 NOTE — Telephone Encounter (Signed)
Refill? Last visit 10/13/13

## 2014-12-10 NOTE — Telephone Encounter (Signed)
Refill denied.  Has not been seen in over a year

## 2014-12-12 ENCOUNTER — Encounter: Payer: Self-pay | Admitting: Emergency Medicine

## 2014-12-12 ENCOUNTER — Emergency Department
Admission: EM | Admit: 2014-12-12 | Discharge: 2014-12-12 | Disposition: A | Discharge status: Home / Self Care | Payer: Medicare Other | Attending: Emergency Medicine | Admitting: Emergency Medicine

## 2014-12-12 DIAGNOSIS — R0689 Other abnormalities of breathing: Secondary | ICD-10-CM | POA: Insufficient documentation

## 2014-12-12 DIAGNOSIS — Z79899 Other long term (current) drug therapy: Secondary | ICD-10-CM | POA: Diagnosis not present

## 2014-12-12 DIAGNOSIS — N390 Urinary tract infection, site not specified: Secondary | ICD-10-CM

## 2014-12-12 DIAGNOSIS — R55 Syncope and collapse: Secondary | ICD-10-CM | POA: Insufficient documentation

## 2014-12-12 DIAGNOSIS — E119 Type 2 diabetes mellitus without complications: Secondary | ICD-10-CM | POA: Diagnosis not present

## 2014-12-12 LAB — URINALYSIS COMPLETE WITH MICROSCOPIC (ARMC ONLY)
BILIRUBIN URINE: NEGATIVE
Glucose, UA: 50 mg/dL — AB
Hgb urine dipstick: NEGATIVE
Nitrite: NEGATIVE
Protein, ur: 100 mg/dL — AB
Specific Gravity, Urine: 1.029 (ref 1.005–1.030)
pH: 5 (ref 5.0–8.0)

## 2014-12-12 LAB — BASIC METABOLIC PANEL
Anion gap: 10 (ref 5–15)
BUN: 16 mg/dL (ref 6–20)
CALCIUM: 8.6 mg/dL — AB (ref 8.9–10.3)
CO2: 23 mmol/L (ref 22–32)
CREATININE: 1.39 mg/dL — AB (ref 0.44–1.00)
Chloride: 99 mmol/L — ABNORMAL LOW (ref 101–111)
GFR calc Af Amer: 38 mL/min — ABNORMAL LOW (ref >60)
GFR calc non Af Amer: 33 mL/min — ABNORMAL LOW (ref >60)
GLUCOSE: 214 mg/dL — AB (ref 65–99)
Potassium: 3.6 mmol/L (ref 3.5–5.1)
SODIUM: 132 mmol/L — AB (ref 135–145)

## 2014-12-12 LAB — CBC
HEMATOCRIT: 32.9 % — AB (ref 35.0–47.0)
Hemoglobin: 10.6 g/dL — ABNORMAL LOW (ref 12.0–16.0)
MCH: 29.7 pg (ref 26.0–34.0)
MCHC: 32.3 g/dL (ref 32.0–36.0)
MCV: 91.7 fL (ref 80.0–100.0)
Platelets: 220 10*3/uL (ref 150–440)
RBC: 3.59 MIL/uL — AB (ref 3.80–5.20)
RDW: 13.5 % (ref 11.5–14.5)
WBC: 10.1 10*3/uL (ref 3.6–11.0)

## 2014-12-12 LAB — TROPONIN I

## 2014-12-12 MED ORDER — CEPHALEXIN 500 MG PO CAPS: 500.0000 mg | ORAL_CAPSULE | Freq: Four times a day (QID) | ORAL | Status: AC

## 2014-12-12 MED ORDER — CEPHALEXIN 500 MG PO CAPS
ORAL_CAPSULE | ORAL | Status: AC
  Administered 2014-12-12: 500 mg via ORAL
  Filled 2014-12-12: qty 1

## 2014-12-12 MED ORDER — CEPHALEXIN 500 MG PO CAPS
500.0000 mg | ORAL_CAPSULE | Freq: Once | ORAL | Status: AC
  Administered 2014-12-12: 500 mg via ORAL

## 2014-12-12 NOTE — ED Provider Notes (Signed)
[facility] Emergency Department Provider Note    ____________________________________________  Time seen: on arrival  I have reviewed the triage vital signs and the nursing notes.   HISTORY  Chief Complaint No chief complaint on file.   History limited by: Not Limited   HPI Sheena [name] is a 81 y.o. female Presents to the emergency department today after a syncopal episode. Per EMS report the patient also had some decreased respiratory rate during this episode. Patient had one episode of emesis after the syncope. She denies any chest pain or shortness of breath. She has had similar symptoms in the past. She states she has had workup for this in the past without any obvious etiology being found.     Past Medical History  Diagnosis Date  . H/O: hysterectomy 1973    fibroids  . Lumbago   . Left arm pain   . Diabetes mellitus, type 2   . Melanoma     left arm, resected, annual CXR's ordered  . Asthma     treated by Dr. Donneta Romberg  . Depression   . Syncope 08/2013    Suspected vagal-mediated    Patient Active Problem List   Diagnosis Date Noted  . Back pain, thoracic 10/14/2013  . Mild cognitive impairment with memory loss 10/04/2013  . Syncope and collapse 08/26/2013  . Other and unspecified hyperlipidemia 05/14/2013  . Unspecified constipation 05/14/2013  . Osteoarthritis of left hip 01/11/2013  . History of syncope 11/15/2012  . Ankle fracture, left 11/15/2012  . H/O acute renal failure 11/15/2012  . Proximal muscle weakness 10/30/2012  . Neurotic excoriations 09/02/2012  . Loss of balance 06/06/2012  . Melanoma   . Asthma   . Insomnia, persistent 09/01/2011  . Severe major depression with psychotic features 09/01/2011  . Obesity (BMI 30-39.9) 09/01/2011  . Type II or unspecified type diabetes mellitus with renal manifestations, not stated as uncontrolled(250.40) 09/01/2011    Past Surgical History  Procedure Laterality Date  .  Total hip arthroplasty  2003    left, Dr. Durward Fortes  . Breast biopsy  1985    normal  . Joint replacement   2003    Left Hip  . Abdominal hysterectomy  1973    fibroid uterus  . Transthoracic echocardiogram  08/23/13    EF 60-65%, borderline concentric LVH, impaired LV diastolic relaxation, LA ULN    Current Outpatient Rx  Name  Route  Sig  Dispense  Refill  . ASPIRIN LOW DOSE 81 MG EC tablet      TAKE ONE TABLET BY MOUTH EVERY DAY.   30 tablet   0     NEEDS OFFICE VISIT PRIOR TO FURTHER REFILLS, PLEAS ...   . atorvastatin (LIPITOR) 40 MG tablet      TAKE ONE TABLET BY MOUTH EVERY DAY.   30 tablet   0     NEEDS OFFICE VISIT PRIOR TO FURTHER REFILLS, PLEAS ...   . B-D ULTRAFINE III SHORT PEN 31G X 8 MM MISC      USE AS DIRECTED TO INJECT INSULIN.   100 each   5   . beta carotene w/minerals (OCUVITE) tablet      TAKE ONE TABLET BY MOUTH EVERY DAY.   30 tablet   5   . Blood Pressure KIT      Use daily to check blood pressure  401.9   1 each   0   . Calcium Carbonate (CALCIUM 500 PO)   Oral  Take by mouth.         . doxepin (SINEQUAN) 10 MG capsule   Oral   Take 1 capsule (10 mg total) by mouth at bedtime as needed.   30 capsule   2   . ergocalciferol (DRISDOL) 50000 UNITS capsule   Oral   Take 1 capsule (50,000 Units total) by mouth once a week.   4 capsule   2   . escitalopram (LEXAPRO) 20 MG tablet      TAKE (1) TABLET BY MOUTH DAILY.   30 tablet   0     NEEDS OFFICE VISIT PRIOR TO FURTHER REFILLS, PLEAS ...   . Fluticasone-Salmeterol (ADVAIR DISKUS) 100-50 MCG/DOSE AEPB   Inhalation   Inhale 1 puff into the lungs every 12 (twelve) hours.   60 each   11   . HYDROcodone-acetaminophen (NORCO/VICODIN) 5-325 MG per tablet   Oral   Take 1 tablet by mouth 2 (two) times daily as needed for moderate pain.   60 tablet   0   . hydrOXYzine (ATARAX/VISTARIL) 25 MG tablet   Oral   Take 25 mg by mouth daily.         . hydrOXYzine  (ATARAX/VISTARIL) 25 MG tablet      TAKE 2 TABLETS (50 MG TOTAL) BY MOUTH EVERY 8 (EIGHT) HOURS AS NEEDED FOR ITCHING.   180 tablet   0   . insulin glargine (LANTUS) 100 UNIT/ML injection   Subcutaneous   Inject 0.16 mLs (16 Units total) into the skin at bedtime.   5 pen   3   . JANUVIA 100 MG tablet      TAKE ONE TABLET BY MOUTH ONCE DAILY.   30 tablet   5   . LANTUS SOLOSTAR 100 UNIT/ML Solostar Pen      INJECT 16 UNITS SUBCUTANEOUSLY AT BEDTIME.   15 mL   1   . loratadine (CLARITIN) 10 MG tablet      TAKE ONE TABLET BY MOUTH EVERY DAY.   30 tablet   3   . losartan (COZAAR) 50 MG tablet      TAKE 1 TABLET BY MOUTH ONCE A DAY.   30 tablet   0     NEEDS OFFICE VISIT PRIOR TO FURTHER REFILLS, PLEAS ...   . Multiple Vitamin (MULTIVITAMIN) tablet   Oral   Take 1 tablet by mouth daily.         . Omega-3 Fatty Acids (FISH OIL) 1000 MG CAPS      TAKE ONE CAPSULE BY MOUTH EVERY DAY.   30 capsule   11   . omega-3 fish oil (MAXEPA) 1000 MG CAPS capsule      TAKE ONE CAPSULE BY MOUTH EVERY DAY.   30 capsule   5   . omega-3 fish oil (MAXEPA) 1000 MG CAPS capsule      TAKE ONE CAPSULE BY MOUTH EVERY DAY.   30 capsule   0     NEEDS OFFICE VISIT PRIOR TO FURTHER REFILLS, PLEAS ...   . omeprazole (PRILOSEC) 40 MG capsule      TAKE 1 CAPSULE BY MOUTH DAILY.   30 capsule   0     NEEDS OFFICE VISIT PRIOR TO FURTHER REFILLS, PLEAS ...   . polyethylene glycol powder (GLYCOLAX/MIRALAX) powder   Oral   Take 17 g by mouth daily.   3350 g   1   . QUEtiapine (SEROQUEL) 50 MG tablet      TAKE (1) TABLET  BY MOUTH TWICE DAILY.   60 tablet   5   . STOOL SOFTENER 100 MG capsule      TAKE (1) CAPSULE BY MOUTH TWICE DAILY.   60 capsule   3     Allergies Review of patient's allergies indicates no known allergies.  Family History  Problem Relation Age of Onset  . Diabetes Mother   . Heart disease Mother     CHF  . Heart disease Father   . Diabetes  Father     Social History History  Substance Use Topics  . Smoking status: Never Smoker   . Smokeless tobacco: Never Used  . Alcohol Use: No     Comment: occasional    Review of Systems  Constitutional: Negative for fever. Cardiovascular: Negative for chest pain. Respiratory: Negative for shortness of breath. Gastrointestinal: Negative for abdominal pain, vomiting and diarrhea. Genitourinary: Negative for dysuria. Musculoskeletal: Negative for back pain. Skin: Negative for rash. Neurological: Negative for headaches, focal weakness or numbness.  10-point ROS otherwise negative.  ____________________________________________   PHYSICAL EXAM:  Constitutional: Alert and oriented. Well appearing and in no distress. Eyes: Conjunctivae are normal. PERRL. Normal extraocular movements. ENT   Head: Normocephalic and atraumatic.   Nose: No congestion/rhinnorhea.   Mouth/Throat: Mucous membranes are moist.   Neck: No stridor. Hematological/Lymphatic/Immunilogical: No cervical lymphadenopathy. Cardiovascular: Normal rate, regular rhythm.  No murmurs, rubs, or gallops. Respiratory: Normal respiratory effort without tachypnea nor retractions. Breath sounds are clear and equal bilaterally. No wheezes/rales/rhonchi. Gastrointestinal: Soft and nontender. No distention.  Genitourinary: Deferred Musculoskeletal: Normal range of motion in all extremities. No joint effusions.  No lower extremity tenderness nor edema. Neurologic:  Normal speech and language. No gross focal neurologic deficits are appreciated. Speech is normal. No gait instability. Skin:  Skin is warm, dry and intact. No rash noted. Psychiatric: Mood and affect are normal. Speech and behavior are normal. Patient exhibits appropriate insight and judgment.  ____________________________________________    LABS (pertinent positives/negatives)  Labs Reviewed  CBC - Abnormal; Notable for the following:    RBC 3.59  (*)    Hemoglobin 10.6 (*)    HCT 32.9 (*)    All other components within normal limits  BASIC METABOLIC PANEL - Abnormal; Notable for the following:    Sodium 132 (*)    Chloride 99 (*)    Glucose, Bld 214 (*)    Creatinine, Ser 1.39 (*)    Calcium 8.6 (*)    GFR calc non Af Amer 33 (*)    GFR calc Af Amer 38 (*)    All other components within normal limits  URINALYSIS COMPLETEWITH MICROSCOPIC ([facility])  - Abnormal; Notable for the following:    Color, Urine YELLOW (*)    APPearance CLOUDY (*)    Glucose, UA 50 (*)    Ketones, ur TRACE (*)    Protein, ur 100 (*)    Leukocytes, UA TRACE (*)    Bacteria, UA MANY (*)    Squamous Epithelial / LPF 6-30 (*)    All other components within normal limits  TROPONIN I     ____________________________________________   EKG  EKG Time: 1759 Rate: 85 Rhythm: normal sinus rhythm Axis: left axis deviation Intervals: QTC 480 QRS: minimal voltage criteria for LVH ST changes: no ST elevation    ____________________________________________    RADIOLOGY  none  ____________________________________________   PROCEDURES  Procedure(s) performed: None  Critical Care performed: No  ____________________________________________   INITIAL IMPRESSION / ASSESSMENT AND PLAN /  ED COURSE  Pertinent labs & imaging results that were available during my care of the patient were reviewed by me and considered in my medical decision making (see chart for details).  Patient here after syncopal episode. Patient has had similar episodes in the past with unrevealing workup. We will get basic blood work, urine.  Urinalysis concerning for UTI. Will treat.  ____________________________________________   FINAL CLINICAL IMPRESSION(S) / ED DIAGNOSES  Final diagnoses:  Syncope, unspecified syncope type  UTI (lower urinary tract infection)     Nance Pear, MD 12/12/14 2125

## 2014-12-12 NOTE — ED Notes (Signed)
Patient placed on 1L O2 for sats at 90%.

## 2014-12-12 NOTE — ED Notes (Signed)
E-sign pad not working.  

## 2014-12-12 NOTE — Discharge Instructions (Signed)
Seek medical attention for any high fevers, chest pain, abdominal pain, shortness of breath, change behavior or any other new or concerning symptoms.   Urinary Tract Infection Urinary tract infections (UTIs) can develop anywhere along your urinary tract. Your urinary tract is your body's drainage system for removing wastes and extra water. Your urinary tract includes two kidneys, two ureters, a bladder, and a urethra. Your kidneys are a pair of bean-shaped organs. Each kidney is about the size of your fist. They are located below your ribs, one on each side of your spine. CAUSES Infections are caused by microbes, which are microscopic organisms, including fungi, viruses, and bacteria. These organisms are so small that they can only be seen through a microscope. Bacteria are the microbes that most commonly cause UTIs. SYMPTOMS  Symptoms of UTIs may vary by age and gender of the patient and by the location of the infection. Symptoms in young women typically include a frequent and intense urge to urinate and a painful, burning feeling in the bladder or urethra during urination. Older women and men are more likely to be tired, shaky, and weak and have muscle aches and abdominal pain. A fever may mean the infection is in your kidneys. Other symptoms of a kidney infection include pain in your back or sides below the ribs, nausea, and vomiting. DIAGNOSIS To diagnose a UTI, your caregiver will ask you about your symptoms. Your caregiver also will ask to provide a urine sample. The urine sample will be tested for bacteria and white blood cells. White blood cells are made by your body to help fight infection. TREATMENT  Typically, UTIs can be treated with medication. Because most UTIs are caused by a bacterial infection, they usually can be treated with the use of antibiotics. The choice of antibiotic and length of treatment depend on your symptoms and the type of bacteria causing your infection. HOME CARE  INSTRUCTIONS  If you were prescribed antibiotics, take them exactly as your caregiver instructs you. Finish the medication even if you feel better after you have only taken some of the medication.  Drink enough water and fluids to keep your urine clear or pale yellow.  Avoid caffeine, tea, and carbonated beverages. They tend to irritate your bladder.  Empty your bladder often. Avoid holding urine for long periods of time.  Empty your bladder before and after sexual intercourse.  After a bowel movement, women should cleanse from front to back. Use each tissue only once. SEEK MEDICAL CARE IF:   You have back pain.  You develop a fever.  Your symptoms do not begin to resolve within 3 days. SEEK IMMEDIATE MEDICAL CARE IF:   You have severe back pain or lower abdominal pain.  You develop chills.  You have nausea or vomiting.  You have continued burning or discomfort with urination. MAKE SURE YOU:   Understand these instructions.  Will watch your condition.  Will get help right away if you are not doing well or get worse. Document Released: 05/06/2005 Document Revised: 01/26/2012 Document Reviewed: 09/04/2011 [facility] Patient Information 2015 [place], [place]. This information is not intended to replace advice given to you by your health care provider. Make sure you discuss any questions you have with your health care provider.  Syncope Syncope means a person passes out (faints). The person usually wakes up in less than 5 minutes. It is important to seek medical care for syncope. HOME CARE  Have someone stay with you until you feel normal.  Do  not drive, use machines, or play sports until your doctor says it is okay.  Keep all doctor visits as told.  Lie down when you feel like you might pass out. Take deep breaths. Wait until you feel normal before standing up.  Drink enough fluids to keep your pee (urine) clear or pale yellow.  If you take blood pressure or heart  medicine, get up slowly. Take several minutes to sit and then stand. GET HELP RIGHT AWAY IF:   You have a severe headache.  You have pain in the chest, belly (abdomen), or back.  You are bleeding from the mouth or butt (rectum).  You have black or tarry poop (stool).  You have an irregular or very fast heartbeat.  You have pain with breathing.  You keep passing out, or you have shaking (seizures) when you pass out.  You pass out when sitting or lying down.  You feel confused.  You have trouble walking.  You have severe weakness.  You have vision problems. If you fainted, call your local emergency services (911 in [place] Do not drive yourself to the hospital. MAKE SURE YOU:   Understand these instructions.  Will watch your condition.  Will get help right away if you are not doing well or get worse. Document Released: 01/13/2008 Document Revised: 01/26/2012 Document Reviewed: 09/25/2011 [facility] Patient Information 2015 [place], [place]. This information is not intended to replace advice given to you by your health care provider. Make sure you discuss any questions you have with your health care provider.

## 2014-12-12 NOTE — ED Notes (Signed)
Pt presents via [facility]. Per EMS pt had a witness syncopal episode, with decreased RR, per EMS pt never lost pulse but believes it was possible for agonal respirations. EMS states daughter opened airway and resp failure was not noted on their arrival. EMS states pt has been nauseated with 1 episode of vomiting since the syncopal episode.

## 2014-12-12 NOTE — ED Notes (Signed)
Pt. Did not want another set of VS.

## 2014-12-12 NOTE — ED Notes (Signed)
Pt is alert, answers simple questions appropriately.

## 2014-12-23 ENCOUNTER — Encounter: Payer: Self-pay | Admitting: *Deleted

## 2014-12-23 ENCOUNTER — Emergency Department
Admission: EM | Admit: 2014-12-23 | Discharge: 2014-12-24 | Disposition: A | Discharge status: Home / Self Care | Payer: Medicare Other | Attending: Student | Admitting: Student

## 2014-12-23 DIAGNOSIS — Z7951 Long term (current) use of inhaled steroids: Secondary | ICD-10-CM | POA: Diagnosis not present

## 2014-12-23 DIAGNOSIS — N12 Tubulo-interstitial nephritis, not specified as acute or chronic: Secondary | ICD-10-CM | POA: Diagnosis not present

## 2014-12-23 DIAGNOSIS — Z7982 Long term (current) use of aspirin: Secondary | ICD-10-CM | POA: Insufficient documentation

## 2014-12-23 DIAGNOSIS — E119 Type 2 diabetes mellitus without complications: Secondary | ICD-10-CM | POA: Insufficient documentation

## 2014-12-23 DIAGNOSIS — Z794 Long term (current) use of insulin: Secondary | ICD-10-CM | POA: Diagnosis not present

## 2014-12-23 DIAGNOSIS — Z79899 Other long term (current) drug therapy: Secondary | ICD-10-CM | POA: Insufficient documentation

## 2014-12-23 DIAGNOSIS — R1030 Lower abdominal pain, unspecified: Secondary | ICD-10-CM

## 2014-12-23 DIAGNOSIS — M545 Low back pain: Secondary | ICD-10-CM

## 2014-12-23 DIAGNOSIS — Z792 Long term (current) use of antibiotics: Secondary | ICD-10-CM | POA: Diagnosis not present

## 2014-12-23 DIAGNOSIS — J441 Chronic obstructive pulmonary disease with (acute) exacerbation: Secondary | ICD-10-CM | POA: Insufficient documentation

## 2014-12-23 LAB — URINALYSIS COMPLETE WITH MICROSCOPIC (ARMC ONLY)
Bilirubin Urine: NEGATIVE
GLUCOSE, UA: 50 mg/dL — AB
Ketones, ur: NEGATIVE mg/dL
Leukocytes, UA: NEGATIVE
Nitrite: POSITIVE — AB
PH: 5 (ref 5.0–8.0)
PROTEIN: NEGATIVE mg/dL
RBC / HPF: NONE SEEN RBC/hpf (ref 0–5)
Specific Gravity, Urine: 1.017 (ref 1.005–1.030)

## 2014-12-23 NOTE — ED Notes (Signed)
Pt presents w/ c/o low back pain that has been persistent since last admission, worsening over the past 3 days. Pt has recent hx of UTI and CPR.

## 2014-12-23 NOTE — ED Provider Notes (Signed)
[facility] Emergency Department Provider Note  ____________________________________________  Time seen: Approximately 11:52 PM  I have reviewed the triage vital signs and the nursing notes.   HISTORY  Chief Complaint Back Pain    HPI Sheena [name] is a 81 y.o. female with mild cognitive impairment with memory loss, diabetes, COPD presents for evaluation of approximately 12 days of gradual onset, constant lower back pain. Patient was seen in this emergency department on 12/12/2014 after a syncopal episode during which her caregiver gave her CPR. She was discharged from the emergency Department on the same day with antibiotics for urinary tract infection. Unfortunately, her family did not realize that the prescription was mixed in with her discharge paperwork and that prescription was never filled. The patient has had worsening bilateral back pain, yesterday had 2 episodes of nonbloody nonbilious emesis, she has developed mild abdominal pain. No fevers. She has had worsening productive cough but denies any chest pain or difficulty breathing. No modifying factors.   Past Medical History  Diagnosis Date  . H/O: hysterectomy 1973    fibroids  . Lumbago   . Left arm pain   . Diabetes mellitus, type 2   . Melanoma     left arm, resected, annual CXR's ordered  . Asthma     treated by Dr. Donneta Romberg  . Depression   . Syncope 08/2013    Suspected vagal-mediated    Patient Active Problem List   Diagnosis Date Noted  . Back pain, thoracic 10/14/2013  . Mild cognitive impairment with memory loss 10/04/2013  . Syncope and collapse 08/26/2013  . Other and unspecified hyperlipidemia 05/14/2013  . Unspecified constipation 05/14/2013  . Osteoarthritis of left hip 01/11/2013  . History of syncope 11/15/2012  . Ankle fracture, left 11/15/2012  . H/O acute renal failure 11/15/2012  . Proximal muscle weakness 10/30/2012  . Neurotic excoriations 09/02/2012  . Loss of  balance 06/06/2012  . Melanoma   . Asthma   . Insomnia, persistent 09/01/2011  . Severe major depression with psychotic features 09/01/2011  . Obesity (BMI 30-39.9) 09/01/2011  . Type II or unspecified type diabetes mellitus with renal manifestations, not stated as uncontrolled(250.40) 09/01/2011    Past Surgical History  Procedure Laterality Date  . Total hip arthroplasty  2003    left, Dr. Durward Fortes  . Breast biopsy  1985    normal  . Joint replacement   2003    Left Hip  . Abdominal hysterectomy  1973    fibroid uterus  . Transthoracic echocardiogram  08/23/13    EF 60-65%, borderline concentric LVH, impaired LV diastolic relaxation, LA ULN    Current Outpatient Rx  Name  Route  Sig  Dispense  Refill  . ASPIRIN LOW DOSE 81 MG EC tablet      TAKE ONE TABLET BY MOUTH EVERY DAY.   30 tablet   0     NEEDS OFFICE VISIT PRIOR TO FURTHER REFILLS, PLEAS ...   . atorvastatin (LIPITOR) 40 MG tablet      TAKE ONE TABLET BY MOUTH EVERY DAY.   30 tablet   0     NEEDS OFFICE VISIT PRIOR TO FURTHER REFILLS, PLEAS ...   . B-D ULTRAFINE III SHORT PEN 31G X 8 MM MISC      USE AS DIRECTED TO INJECT INSULIN.   100 each   5   . beta carotene w/minerals (OCUVITE) tablet      TAKE ONE TABLET BY MOUTH EVERY DAY.  30 tablet   5   . Blood Pressure KIT      Use daily to check blood pressure  401.9   1 each   0   . Calcium Carbonate (CALCIUM 500 PO)   Oral   Take by mouth.         . ciprofloxacin (CIPRO) 500 MG tablet   Oral   Take 1 tablet (500 mg total) by mouth 2 (two) times daily.   20 tablet   0   . doxepin (SINEQUAN) 10 MG capsule   Oral   Take 1 capsule (10 mg total) by mouth at bedtime as needed.   30 capsule   2   . ergocalciferol (DRISDOL) 50000 UNITS capsule   Oral   Take 1 capsule (50,000 Units total) by mouth once a week.   4 capsule   2   . escitalopram (LEXAPRO) 20 MG tablet      TAKE (1) TABLET BY MOUTH DAILY.   30 tablet   0     NEEDS  OFFICE VISIT PRIOR TO FURTHER REFILLS, PLEAS ...   . Fluticasone-Salmeterol (ADVAIR DISKUS) 100-50 MCG/DOSE AEPB   Inhalation   Inhale 1 puff into the lungs every 12 (twelve) hours.   60 each   11   . HYDROcodone-acetaminophen (NORCO/VICODIN) 5-325 MG per tablet   Oral   Take 1 tablet by mouth 2 (two) times daily as needed for moderate pain.   60 tablet   0   . hydrOXYzine (ATARAX/VISTARIL) 25 MG tablet   Oral   Take 25 mg by mouth daily.         . hydrOXYzine (ATARAX/VISTARIL) 25 MG tablet      TAKE 2 TABLETS (50 MG TOTAL) BY MOUTH EVERY 8 (EIGHT) HOURS AS NEEDED FOR ITCHING.   180 tablet   0   . insulin glargine (LANTUS) 100 UNIT/ML injection   Subcutaneous   Inject 0.16 mLs (16 Units total) into the skin at bedtime.   5 pen   3   . JANUVIA 100 MG tablet      TAKE ONE TABLET BY MOUTH ONCE DAILY.   30 tablet   5   . LANTUS SOLOSTAR 100 UNIT/ML Solostar Pen      INJECT 16 UNITS SUBCUTANEOUSLY AT BEDTIME.   15 mL   1   . loratadine (CLARITIN) 10 MG tablet      TAKE ONE TABLET BY MOUTH EVERY DAY.   30 tablet   3   . losartan (COZAAR) 50 MG tablet      TAKE 1 TABLET BY MOUTH ONCE A DAY.   30 tablet   0     NEEDS OFFICE VISIT PRIOR TO FURTHER REFILLS, PLEAS ...   . Multiple Vitamin (MULTIVITAMIN) tablet   Oral   Take 1 tablet by mouth daily.         . Omega-3 Fatty Acids (FISH OIL) 1000 MG CAPS      TAKE ONE CAPSULE BY MOUTH EVERY DAY.   30 capsule   11   . omega-3 fish oil (MAXEPA) 1000 MG CAPS capsule      TAKE ONE CAPSULE BY MOUTH EVERY DAY.   30 capsule   5   . omega-3 fish oil (MAXEPA) 1000 MG CAPS capsule      TAKE ONE CAPSULE BY MOUTH EVERY DAY.   30 capsule   0     NEEDS OFFICE VISIT PRIOR TO FURTHER REFILLS, PLEAS ...   . omeprazole (PRILOSEC)  40 MG capsule      TAKE 1 CAPSULE BY MOUTH DAILY.   30 capsule   0     NEEDS OFFICE VISIT PRIOR TO FURTHER REFILLS, PLEAS ...   . polyethylene glycol powder (GLYCOLAX/MIRALAX)  powder   Oral   Take 17 g by mouth daily.   3350 g   1   . QUEtiapine (SEROQUEL) 50 MG tablet      TAKE (1) TABLET BY MOUTH TWICE DAILY.   60 tablet   5   . STOOL SOFTENER 100 MG capsule      TAKE (1) CAPSULE BY MOUTH TWICE DAILY.   60 capsule   3     Allergies Review of patient's allergies indicates no known allergies.  Family History  Problem Relation Age of Onset  . Diabetes Mother   . Heart disease Mother     CHF  . Heart disease Father   . Diabetes Father     Social History History  Substance Use Topics  . Smoking status: Never Smoker   . Smokeless tobacco: Never Used  . Alcohol Use: No     Comment: occasional    Review of Systems Constitutional: No fever/chills Eyes: No visual changes. ENT: No sore throat. Cardiovascular: Denies chest pain. Respiratory: Denies shortness of breath. Gastrointestinal: + abdominal pain.  + nausea, +vomiting.  No diarrhea.  No constipation. Genitourinary: Negative for dysuria. Musculoskeletal: +for back pain. Skin: Negative for rash. Neurological: Negative for headaches, focal weakness or numbness.  10-point ROS otherwise negative.  ____________________________________________   PHYSICAL EXAM:  VITAL SIGNS: ED Triage Vitals  Enc Vitals Group     BP 12/23/14 2129 133/53 mmHg     Pulse Rate 12/23/14 2129 76     Resp 12/23/14 2129 20     Temp 12/23/14 2129 98.1 F (36.7 C)     Temp Source 12/23/14 2129 Oral     SpO2 12/23/14 2129 96 %     Weight 12/23/14 2129 240 lb (108.863 kg)     Height 12/23/14 2129 '5\' 5"'  (1.651 m)     Head Cir --      Peak Flow --      Pain Score 12/23/14 2131 5     Pain Loc --      Pain Edu? --      Excl. in [place]? --     Constitutional: Alert and oriented. Well appearing and in no acute distress. Eyes: Conjunctivae are normal. PERRL. EOMI. Head: Atraumatic. Nose: No congestion/rhinnorhea. Mouth/Throat: Mucous membranes are moist.  Oropharynx non-erythematous. Neck: No stridor.   Cardiovascular: Normal rate, regular rhythm. Grossly normal heart sounds.  Good peripheral circulation. Respiratory: Normal respiratory effort.  No retractions.+ mild expiratory wheeze Gastrointestinal: Soft and nontender. No distention. No abdominal bruits.  Genitourinary: Deferred Back: Mild midline/vertebral tenderness in the L-spine as well as tenderness in the left paravertebral muscles of the lumbar spine Musculoskeletal: No lower extremity tenderness nor edema.  No joint effusions. Neurologic:  Normal speech and language. No gross focal neurologic deficits are appreciated. Speech is normal. 5 out of 5 strength in bilateral lower extremities, sensation intact to light touch throughout, strong dorsiflexion of bilateral big toes Skin:  Skin is warm, dry and intact. No rash noted. Psychiatric: Mood and affect are normal. Speech and behavior are normal.  ____________________________________________   LABS (all labs ordered are listed, but only abnormal results are displayed)  Labs Reviewed  URINALYSIS COMPLETEWITH MICROSCOPIC [facility])  - Abnormal; Notable for the following:  Color, Urine YELLOW (*)    APPearance CLEAR (*)    Glucose, UA 50 (*)    Hgb urine dipstick 1+ (*)    Nitrite POSITIVE (*)    Bacteria, UA MANY (*)    Squamous Epithelial / LPF 0-5 (*)    All other components within normal limits  COMPREHENSIVE METABOLIC PANEL - Abnormal; Notable for the following:    Sodium 133 (*)    Potassium 3.3 (*)    Chloride 100 (*)    Glucose, Bld 196 (*)    Creatinine, Ser 1.13 (*)    Calcium 8.3 (*)    Albumin 3.4 (*)    ALT 13 (*)    GFR calc non Af Amer 42 (*)    GFR calc Af Amer 48 (*)    All other components within normal limits  CULTURE, BLOOD (ROUTINE X 2)  CULTURE, BLOOD (ROUTINE X 2)  URINE CULTURE  CBC  TROPONIN I  LIPASE, BLOOD   ____________________________________________  EKG  ED ECG REPORT   Date: 12/24/2014  EKG Time: 01:20  Rate: 69  Rhythm:  normal sinus rhythm  Axis: Normal  Intervals:none  ST&T Change: No acute ST segment change  ____________________________________________  RADIOLOGY  CT abd and pelvis  FINDINGS: Subpleural density in both lung bases, may reflect combination of atelectasis and honeycombing. Suggestion of bronchiectasis in the right lower lobe.  There are no focal hepatic lesions. The gallbladder is physiologically distended. No biliary dilatation. The spleen, adrenal glands, and pancreas are unremarkable. There is thinning of both renal parenchyma. Symmetric renal enhancement and excretion.  Small hiatal hernia. Stomach is physiologically distended. There are no dilated or thickened bowel loops. Small volume of colonic stool. There is moderate distal colonic diverticulosis without diverticulitis. No free air, free fluid, or intra-abdominal fluid collection.  Abdominal aorta is normal in caliber with normal for age mild atherosclerosis. No retroperitoneal adenopathy.  Within the pelvis the urinary bladder is minimally distended. The uterus is surgically absent. No adnexal mass. No pelvic free fluid. There is no pelvic adenopathy.  There is diffuse degenerative disc disease throughout the lumbar spine, transitional lumbosacral anatomy is seen. There is multilevel facet arthropathy. Left hip prosthesis noted. There are no acute or suspicious osseous abnormalities.  IMPRESSION: 1. No acute abnormality in the abdomen/pelvis. 2. Diverticulosis without diverticulitis. 3. Degenerative disc disease and facet arthropathy throughout the lumbar spine.   CXR:  IMPRESSION: No acute pulmonary process. ____________________________________________   PROCEDURES  Procedure(s) performed: None  Critical Care performed: No  ____________________________________________   INITIAL IMPRESSION / ASSESSMENT AND PLAN / ED COURSE  Pertinent labs & imaging results that were available during my care of  the patient were reviewed by me and considered in my medical decision making (see chart for details).  Sheena [name] is a 81 y.o. female with mild cognitive impairment with memory loss, diabetes, COPD presents for evaluation of approximately 12 days of gradual onset, constant lower back pain. She has also developed abdominal pain, has had worsening cough, has had several episodes of vomiting in the setting of untreated urinary tract infection. Vital signs stable, afebrile. Plan for screening labs, EKG, CXR, CT of the abdomen and pelvis to evaluate aorta. We'll treat with ceftriaxone and reassess.  ----------------------------------------- 3:52 AM on 12/24/2014 ----------------------------------------- Labs notable only for very mild hypokalemia. CT of the abdomen and pelvis is negative for any acute pathology. Chest x-ray clear. Vital signs stable, patient is afebrile. Doubt cauda equina. Suspect possibly early pyelonephritis  as the cause of her back pain given untreated nitrite positive UTI. We'll discharge with ciprofloxacin, return precautions, close PCP follow-up. Son at bedside is comfortable with the discharge plan.  ____________________________________________   FINAL CLINICAL IMPRESSION(S) / ED DIAGNOSES  Final diagnoses:  Lower abdominal pain  Low back pain without sciatica, unspecified back pain laterality  Pyelonephritis      Joanne Gavel, MD 12/24/14 [phone]

## 2014-12-24 ENCOUNTER — Encounter: Payer: Self-pay | Admitting: Radiology

## 2014-12-24 ENCOUNTER — Emergency Department: Payer: Medicare Other

## 2014-12-24 ENCOUNTER — Other Ambulatory Visit: Payer: Self-pay

## 2014-12-24 LAB — COMPREHENSIVE METABOLIC PANEL
ALT: 13 U/L — ABNORMAL LOW (ref 14–54)
ANION GAP: 7 (ref 5–15)
AST: 19 U/L (ref 15–41)
Albumin: 3.4 g/dL — ABNORMAL LOW (ref 3.5–5.0)
Alkaline Phosphatase: 74 U/L (ref 38–126)
BUN: 14 mg/dL (ref 6–20)
CALCIUM: 8.3 mg/dL — AB (ref 8.9–10.3)
CHLORIDE: 100 mmol/L — AB (ref 101–111)
CO2: 26 mmol/L (ref 22–32)
CREATININE: 1.13 mg/dL — AB (ref 0.44–1.00)
GFR calc Af Amer: 48 mL/min — ABNORMAL LOW (ref >60)
GFR, EST NON AFRICAN AMERICAN: 42 mL/min — AB (ref >60)
Glucose, Bld: 196 mg/dL — ABNORMAL HIGH (ref 65–99)
Potassium: 3.3 mmol/L — ABNORMAL LOW (ref 3.5–5.1)
Sodium: 133 mmol/L — ABNORMAL LOW (ref 135–145)
Total Bilirubin: 0.4 mg/dL (ref 0.3–1.2)
Total Protein: 7.7 g/dL (ref 6.5–8.1)

## 2014-12-24 LAB — CBC
HCT: 37.1 % (ref 35.0–47.0)
HEMOGLOBIN: 12.2 g/dL (ref 12.0–16.0)
MCH: 30.3 pg (ref 26.0–34.0)
MCHC: 33 g/dL (ref 32.0–36.0)
MCV: 91.8 fL (ref 80.0–100.0)
Platelets: 216 10*3/uL (ref 150–440)
RBC: 4.04 MIL/uL (ref 3.80–5.20)
RDW: 14 % (ref 11.5–14.5)
WBC: 9.9 10*3/uL (ref 3.6–11.0)

## 2014-12-24 LAB — LIPASE, BLOOD: Lipase: 35 U/L (ref 22–51)

## 2014-12-24 LAB — TROPONIN I

## 2014-12-24 MED ORDER — CIPROFLOXACIN HCL 500 MG PO TABS: 500.0000 mg | ORAL_TABLET | Freq: Two times a day (BID) | ORAL | Status: AC

## 2014-12-24 MED ORDER — IOHEXOL 240 MG/ML SOLN
25.0000 mL | Freq: Once | INTRAMUSCULAR | Status: AC | PRN
  Administered 2014-12-24: 25 mL via ORAL

## 2014-12-24 MED ORDER — OXYCODONE-ACETAMINOPHEN 5-325 MG PO TABS
ORAL_TABLET | ORAL | Status: AC
  Administered 2014-12-24: 1 via ORAL
  Filled 2014-12-24: qty 1

## 2014-12-24 MED ORDER — OXYCODONE-ACETAMINOPHEN 5-325 MG PO TABS
1.0000 | ORAL_TABLET | Freq: Once | ORAL | Status: AC
  Administered 2014-12-24: 1 via ORAL

## 2014-12-24 MED ORDER — DEXTROSE 5 % IV SOLN
1.0000 g | Freq: Once | INTRAVENOUS | Status: AC
  Administered 2014-12-24: 1 g via INTRAVENOUS

## 2014-12-24 MED ORDER — CEFTRIAXONE SODIUM 1 G IJ SOLR
INTRAMUSCULAR | Status: AC
  Administered 2014-12-24: 1 g via INTRAVENOUS
  Filled 2014-12-24: qty 10

## 2014-12-24 MED ORDER — IOHEXOL 300 MG/ML  SOLN
100.0000 mL | Freq: Once | INTRAMUSCULAR | Status: AC | PRN
  Administered 2014-12-24: 100 mL via INTRAVENOUS

## 2014-12-24 NOTE — ED Notes (Signed)
Pt completed drinking CT contrast as instructed by tech; to CT via stretcher

## 2014-12-24 NOTE — Discharge Instructions (Signed)
Mrs. Lanigan is to return immediately to the emergency department if she develops severe or worsening abdominal or back pain, fever, recurrent vomiting, diarrhea, blood in vomit or stool, change in her mental status, chest pain, difficulty breathing, or for any other concerns. Otherwise, she needs to see her primary doctor within the next 2 days for repeat evaluation.

## 2014-12-24 NOTE — ED Notes (Addendum)
Pt uprite on stretcher in exam room with no distress noted; son at bedside; pt reports lower back pain for last 3 days, no accomp symptoms; son reports pt seen here recently and dx with UTI but did not realize that there were prescriptions attached to d/c instructions; son does not have current medication list with him at this time

## 2014-12-27 LAB — URINE CULTURE
Culture: >=100000
SPECIAL REQUESTS: NORMAL

## 2014-12-29 LAB — CULTURE, BLOOD (ROUTINE X 2)
Culture: NO GROWTH
Culture: NO GROWTH
Special Requests: NORMAL
Special Requests: NORMAL

## 2015-01-02 ENCOUNTER — Other Ambulatory Visit: Payer: Self-pay | Admitting: Internal Medicine

## 2015-01-02 DIAGNOSIS — E669 Obesity, unspecified: Secondary | ICD-10-CM

## 2015-01-02 DIAGNOSIS — I10 Essential (primary) hypertension: Principal | ICD-10-CM

## 2015-01-02 DIAGNOSIS — E1159 Type 2 diabetes mellitus with other circulatory complications: Principal | ICD-10-CM

## 2015-01-02 DIAGNOSIS — E1169 Type 2 diabetes mellitus with other specified complication: Principal | ICD-10-CM

## 2015-01-02 NOTE — Telephone Encounter (Signed)
Last visit 10/13/13

## 2015-01-03 NOTE — Telephone Encounter (Signed)
Refill denied.  Needs fasting labs and OV Needs to have labs done to verify need for medication.  Labs ordered.

## 2015-01-04 NOTE — Telephone Encounter (Signed)
Called both contact numbers, no answer, unable to leave VMs on either. Mailed letter on need for appt and fasting labs.

## 2015-01-22 ENCOUNTER — Other Ambulatory Visit: Payer: Self-pay | Admitting: Internal Medicine

## 2015-01-28 ENCOUNTER — Other Ambulatory Visit: Payer: Self-pay | Admitting: Internal Medicine

## 2015-02-28 ENCOUNTER — Other Ambulatory Visit: Payer: Self-pay

## 2015-02-28 ENCOUNTER — Emergency Department
Admission: EM | Admit: 2015-02-28 | Discharge: 2015-02-28 | Disposition: A | Discharge status: Home / Self Care | Payer: Medicare Other | Attending: Emergency Medicine | Admitting: Emergency Medicine

## 2015-02-28 ENCOUNTER — Emergency Department: Payer: Medicare Other

## 2015-02-28 ENCOUNTER — Encounter: Payer: Self-pay | Admitting: Emergency Medicine

## 2015-02-28 DIAGNOSIS — E119 Type 2 diabetes mellitus without complications: Secondary | ICD-10-CM | POA: Insufficient documentation

## 2015-02-28 DIAGNOSIS — Z79899 Other long term (current) drug therapy: Secondary | ICD-10-CM | POA: Insufficient documentation

## 2015-02-28 DIAGNOSIS — N390 Urinary tract infection, site not specified: Secondary | ICD-10-CM | POA: Diagnosis not present

## 2015-02-28 DIAGNOSIS — Z7982 Long term (current) use of aspirin: Secondary | ICD-10-CM | POA: Insufficient documentation

## 2015-02-28 DIAGNOSIS — F0390 Unspecified dementia without behavioral disturbance: Secondary | ICD-10-CM | POA: Diagnosis not present

## 2015-02-28 DIAGNOSIS — Z794 Long term (current) use of insulin: Secondary | ICD-10-CM | POA: Diagnosis not present

## 2015-02-28 DIAGNOSIS — R55 Syncope and collapse: Secondary | ICD-10-CM | POA: Insufficient documentation

## 2015-02-28 LAB — URINALYSIS COMPLETE WITH MICROSCOPIC (ARMC ONLY)
Bilirubin Urine: NEGATIVE
GLUCOSE, UA: NEGATIVE mg/dL
Hgb urine dipstick: NEGATIVE
KETONES UR: NEGATIVE mg/dL
Nitrite: POSITIVE — AB
PROTEIN: NEGATIVE mg/dL
Specific Gravity, Urine: 1.019 (ref 1.005–1.030)
pH: 5 (ref 5.0–8.0)

## 2015-02-28 LAB — COMPREHENSIVE METABOLIC PANEL
ALT: 14 U/L (ref 14–54)
AST: 21 U/L (ref 15–41)
Albumin: 3.1 g/dL — ABNORMAL LOW (ref 3.5–5.0)
Alkaline Phosphatase: 80 U/L (ref 38–126)
Anion gap: 10 (ref 5–15)
BILIRUBIN TOTAL: 0.3 mg/dL (ref 0.3–1.2)
BUN: 21 mg/dL — AB (ref 6–20)
CALCIUM: 7.9 mg/dL — AB (ref 8.9–10.3)
CO2: 24 mmol/L (ref 22–32)
Chloride: 100 mmol/L — ABNORMAL LOW (ref 101–111)
Creatinine, Ser: 1.36 mg/dL — ABNORMAL HIGH (ref 0.44–1.00)
GFR calc Af Amer: 39 mL/min — ABNORMAL LOW (ref >60)
GFR calc non Af Amer: 33 mL/min — ABNORMAL LOW (ref >60)
Glucose, Bld: 260 mg/dL — ABNORMAL HIGH (ref 65–99)
Potassium: 3.4 mmol/L — ABNORMAL LOW (ref 3.5–5.1)
SODIUM: 134 mmol/L — AB (ref 135–145)
Total Protein: 6.8 g/dL (ref 6.5–8.1)

## 2015-02-28 LAB — CBC WITH DIFFERENTIAL/PLATELET
Basophils Absolute: 0.1 10*3/uL (ref 0–0.1)
Basophils Relative: 1 %
Eosinophils Absolute: 0.2 10*3/uL (ref 0–0.7)
Eosinophils Relative: 3 %
HCT: 35 % (ref 35.0–47.0)
HEMOGLOBIN: 11.6 g/dL — AB (ref 12.0–16.0)
LYMPHS PCT: 21 %
Lymphs Abs: 1.4 10*3/uL (ref 1.0–3.6)
MCH: 30.3 pg (ref 26.0–34.0)
MCHC: 33.1 g/dL (ref 32.0–36.0)
MCV: 91.6 fL (ref 80.0–100.0)
MONO ABS: 0.7 10*3/uL (ref 0.2–0.9)
Monocytes Relative: 11 %
NEUTROS PCT: 64 %
Neutro Abs: 4.1 10*3/uL (ref 1.4–6.5)
Platelets: 193 10*3/uL (ref 150–440)
RBC: 3.82 MIL/uL (ref 3.80–5.20)
RDW: 14.3 % (ref 11.5–14.5)
WBC: 6.4 10*3/uL (ref 3.6–11.0)

## 2015-02-28 LAB — TROPONIN I: Troponin I: <0.03 ng/mL (ref <0.031)

## 2015-02-28 MED ORDER — OXYCODONE-ACETAMINOPHEN 5-325 MG PO TABS: 1.0000 | ORAL_TABLET | Freq: Four times a day (QID) | ORAL | Status: DC | PRN

## 2015-02-28 MED ORDER — DEXTROSE 5 % IV SOLN
1.0000 g | Freq: Once | INTRAVENOUS | Status: AC
  Administered 2015-02-28: 1 g via INTRAVENOUS
  Filled 2015-02-28: qty 10

## 2015-02-28 MED ORDER — SODIUM CHLORIDE 0.9 % IV BOLUS (SEPSIS)
500.0000 mL | Freq: Once | INTRAVENOUS | Status: AC
  Administered 2015-02-28: 500 mL via INTRAVENOUS

## 2015-02-28 MED ORDER — CEPHALEXIN 500 MG PO CAPS: 500.0000 mg | ORAL_CAPSULE | Freq: Four times a day (QID) | ORAL | Status: AC

## 2015-02-28 NOTE — ED Provider Notes (Signed)
CSN: [id number]     Arrival date & time 02/28/15  1951 History   First MD Initiated Contact with Patient 02/28/15 1952     Chief Complaint  Patient presents with  . Near Syncope     (Consider location/radiation/quality/duration/timing/severity/associated sxs/prior Treatment) The history is provided by a caregiver.  Sheena [name] is a 81 y.o. female hx of DM, depression, syncope here with near syncope. She went out to eat in a restaurant with her ate today. He ate a lot of food and then became unconscious then had 1 episode of vomiting. She was very drowsy afterwards. Denies head injury when she loss consciousness. Has some nonproductive cough for several days. The aide also noticed more smelly urine recently. She was seen in the ED several months ago for near syncope and was diagnosed with UTI and sent home with antibiotics.   Level V caveat- dementia   Past Medical History  Diagnosis Date  . H/O: hysterectomy 1973    fibroids  . Lumbago   . Left arm pain   . Diabetes mellitus, type 2   . Melanoma     left arm, resected, annual CXR's ordered  . Asthma     treated by Dr. Donneta Romberg  . Depression   . Syncope 08/2013    Suspected vagal-mediated   Past Surgical History  Procedure Laterality Date  . Total hip arthroplasty  2003    left, Dr. Durward Fortes  . Breast biopsy  1985    normal  . Joint replacement   2003    Left Hip  . Abdominal hysterectomy  1973    fibroid uterus  . Transthoracic echocardiogram  08/23/13    EF 60-65%, borderline concentric LVH, impaired LV diastolic relaxation, LA ULN   Family History  Problem Relation Age of Onset  . Diabetes Mother   . Heart disease Mother     CHF  . Heart disease Father   . Diabetes Father    History  Substance Use Topics  . Smoking status: Never Smoker   . Smokeless tobacco: Never Used  . Alcohol Use: No     Comment: occasional   OB History    No data available     Review of Systems  Unable to perform ROS: Dementia   Cardiovascular: Positive for near-syncope.      Allergies  Review of patient's allergies indicates no known allergies.  Home Medications   Prior to Admission medications   Medication Sig Start Date End Date Taking? Authorizing Provider  ASPIRIN LOW DOSE 81 MG EC tablet TAKE ONE TABLET BY MOUTH EVERY DAY. 06/12/14   [name], MD  atorvastatin (LIPITOR) 40 MG tablet TAKE ONE TABLET BY MOUTH EVERY DAY. 06/12/14   [name], MD  B-D ULTRAFINE III SHORT PEN 31G X 8 MM MISC USE AS DIRECTED TO INJECT INSULIN. 10/19/13   [name], MD  beta carotene w/minerals (OCUVITE) tablet TAKE ONE TABLET BY MOUTH EVERY DAY. 07/11/14   [name], MD  Blood Pressure KIT Use daily to check blood pressure  401.9 08/24/13   [name], MD  Calcium Carbonate (CALCIUM 500 PO) Take by mouth.    Historical Provider, MD  doxepin (SINEQUAN) 10 MG capsule Take 1 capsule (10 mg total) by mouth at bedtime as needed. 08/24/13   [name], MD  ergocalciferol (DRISDOL) 50000 UNITS capsule Take 1 capsule (50,000 Units total) by mouth once a week. 07/12/12   [name], MD  escitalopram (LEXAPRO) 20 MG tablet TAKE (1) TABLET BY MOUTH DAILY. 06/12/14   [name], MD  Fluticasone-Salmeterol (ADVAIR DISKUS) 100-50 MCG/DOSE AEPB Inhale 1 puff into the lungs every 12 (twelve) hours. 07/11/12   [name], MD  HYDROcodone-acetaminophen (NORCO/VICODIN) 5-325 MG per tablet Take 1 tablet by mouth 2 (two) times daily as needed for moderate pain. 11/07/13   [name], MD  hydrOXYzine (ATARAX/VISTARIL) 25 MG tablet Take 25 mg by mouth daily. 12/01/12   [name], MD  hydrOXYzine (ATARAX/VISTARIL) 25 MG tablet TAKE 2 TABLETS (50 MG TOTAL) BY MOUTH EVERY 8 (EIGHT) HOURS AS NEEDED FOR ITCHING. 04/23/14   [name], MD  insulin glargine (LANTUS) 100 UNIT/ML injection Inject 0.16 mLs (16 Units total) into the skin at bedtime. 06/15/13   [name], MD  JANUVIA 100 MG tablet TAKE ONE TABLET BY  MOUTH ONCE DAILY. 07/11/14   [name], MD  LANTUS SOLOSTAR 100 UNIT/ML Solostar Pen INJECT 16 UNITS SUBCUTANEOUSLY AT BEDTIME. 04/11/14   [name], MD  loratadine (CLARITIN) 10 MG tablet TAKE ONE TABLET BY MOUTH EVERY DAY. 10/31/14   [name], MD  losartan (COZAAR) 50 MG tablet TAKE 1 TABLET BY MOUTH ONCE A DAY. 06/12/14   [name], MD  Multiple Vitamin (MULTIVITAMIN) tablet Take 1 tablet by mouth daily.    Historical Provider, MD  Omega-3 Fatty Acids (FISH OIL) 1000 MG CAPS TAKE ONE CAPSULE BY MOUTH EVERY DAY. 09/08/12   [name], MD  omega-3 fish oil (MAXEPA) 1000 MG CAPS capsule TAKE ONE CAPSULE BY MOUTH EVERY DAY. 01/15/14   [name], MD  omega-3 fish oil (MAXEPA) 1000 MG CAPS capsule TAKE ONE CAPSULE BY MOUTH EVERY DAY. 06/12/14   [name], MD  omeprazole (PRILOSEC) 40 MG capsule TAKE 1 CAPSULE BY MOUTH DAILY. 06/12/14   [name], MD  polyethylene glycol powder (GLYCOLAX/MIRALAX) powder Take 17 g by mouth daily. 05/12/13   [name], MD  QUEtiapine (SEROQUEL) 50 MG tablet TAKE (1) TABLET BY MOUTH TWICE DAILY. 07/11/14   [name], MD  STOOL SOFTENER 100 MG capsule TAKE (1) CAPSULE BY MOUTH TWICE DAILY. 10/31/14   [name], MD   BP 122/55 mmHg  Pulse 74  Temp(Src) 97.3 F (36.3 C) (Oral)  Resp 15  Ht '5\' 5"'  (1.651 m)  Wt 230 lb (104.327 kg)  BMI 38.27 kg/m2  SpO2 94% Physical Exam  Constitutional:  Chronically ill, tired   HENT:  Head: Normocephalic.  Mouth/Throat: Oropharynx is clear and moist.  Eyes: Conjunctivae are normal. Pupils are equal, round, and reactive to light.  Neck: Normal range of motion. Neck supple.  Cardiovascular: Normal rate, regular rhythm and normal heart sounds.   Pulmonary/Chest: Effort normal and breath sounds normal. No respiratory distress. She has no wheezes. She has no rales.  Abdominal: Soft. Bowel sounds are normal. She exhibits no distension. There is no tenderness. There is no rebound.   Musculoskeletal: Normal range of motion. She exhibits no edema or tenderness.  Neurological:  Tired, moving all extremities   Skin: Skin is warm and dry.  Psychiatric:  Unable   Nursing note and vitals reviewed.   ED Course  Procedures (including critical care time) Labs Review Labs Reviewed  CBC WITH DIFFERENTIAL/PLATELET - Abnormal; Notable for the following:    Hemoglobin 11.6 (*)    All other components within normal limits  COMPREHENSIVE METABOLIC PANEL - Abnormal;  Notable for the following:    Sodium 134 (*)    Potassium 3.4 (*)    Chloride 100 (*)    Glucose, Bld 260 (*)    BUN 21 (*)    Creatinine, Ser 1.36 (*)    Calcium 7.9 (*)    Albumin 3.1 (*)    GFR calc non Af Amer 33 (*)    GFR calc Af Amer 39 (*)    All other components within normal limits  URINALYSIS COMPLETEWITH MICROSCOPIC ([facility] ONLY) - Abnormal; Notable for the following:    Color, Urine YELLOW (*)    APPearance HAZY (*)    Nitrite POSITIVE (*)    Leukocytes, UA 1+ (*)    Bacteria, UA MANY (*)    Squamous Epithelial / LPF 0-5 (*)    All other components within normal limits  URINE CULTURE  TROPONIN I    Imaging Review Dg Chest Portable 1 View  02/28/2015   CLINICAL DATA:  Loss of consciousness followed by vomiting when eating tonight.  EXAM: PORTABLE CHEST - 1 VIEW  COMPARISON:  12/24/2014.  FINDINGS: The cardiac silhouette remains borderline enlarged. Clear lungs. Diffuse osteopenia. Thoracic spine degenerative changes.  IMPRESSION: No acute abnormality.   Electronically Signed   By: Claudie Revering M.D.   On: 02/28/2015 20:12     EKG Interpretation None      ED ECG REPORT   Date: 02/28/2015  EKG Time: 9:52 PM  Rate: 82  Rhythm: normal sinus rhythm,  normal EKG, normal sinus rhythm  Axis:  normal  Intervals:LVH  ST&T Change: nonspecific  Narrative Interpretation: LVH             MDM   Final diagnoses:  None    LAYLONIE MARZEC is a 81 y.o. female here with near syncope. BP  slightly low. Aid states that she has vasovagal syncope. Will check labs, UA, CXR to r/o infection. Will get troponin as well.   9:52 PM Cr. 1.36, baseline. UA + UTI. Urine culture sent. Given ceftriaxone. Mental status improved. Aid comfortable taking patient home. Will dc home with keflex.     Wandra Arthurs, MD 02/28/15 2153

## 2015-02-28 NOTE — Discharge Instructions (Signed)
Take keflex four times daily for 10 days.   Take motrin for pain.   Take percocet for severe pain. She may be drowsy from it.   Follow up with your doctor.   Return to ER if she has fever, passing out, vomiting.

## 2015-02-28 NOTE — ED Notes (Signed)
Pt arrived via EMS from [facility] with home nurse at bedside. Per home nurse, patient was eating and went unconscious then vomited. Afterwards patient was drowsy. Pt with no SOB an din NAD.

## 2015-03-03 LAB — URINE CULTURE: Culture: >=100000

## 2015-06-29 ENCOUNTER — Other Ambulatory Visit: Payer: Self-pay

## 2015-06-29 ENCOUNTER — Emergency Department
Admission: EM | Admit: 2015-06-29 | Discharge: 2015-06-29 | Disposition: A | Discharge status: Home / Self Care | Payer: Medicare Other | Attending: Emergency Medicine | Admitting: Emergency Medicine

## 2015-06-29 ENCOUNTER — Emergency Department: Payer: Medicare Other

## 2015-06-29 ENCOUNTER — Encounter: Payer: Self-pay | Admitting: Emergency Medicine

## 2015-06-29 DIAGNOSIS — N390 Urinary tract infection, site not specified: Secondary | ICD-10-CM | POA: Insufficient documentation

## 2015-06-29 DIAGNOSIS — Z79899 Other long term (current) drug therapy: Secondary | ICD-10-CM | POA: Insufficient documentation

## 2015-06-29 DIAGNOSIS — R1011 Right upper quadrant pain: Secondary | ICD-10-CM | POA: Diagnosis present

## 2015-06-29 DIAGNOSIS — E119 Type 2 diabetes mellitus without complications: Secondary | ICD-10-CM | POA: Diagnosis not present

## 2015-06-29 DIAGNOSIS — Z794 Long term (current) use of insulin: Secondary | ICD-10-CM | POA: Diagnosis not present

## 2015-06-29 DIAGNOSIS — Z7951 Long term (current) use of inhaled steroids: Secondary | ICD-10-CM | POA: Diagnosis not present

## 2015-06-29 DIAGNOSIS — J45909 Unspecified asthma, uncomplicated: Secondary | ICD-10-CM | POA: Diagnosis not present

## 2015-06-29 DIAGNOSIS — I10 Essential (primary) hypertension: Secondary | ICD-10-CM | POA: Diagnosis not present

## 2015-06-29 LAB — URINALYSIS COMPLETE WITH MICROSCOPIC (ARMC ONLY)
BILIRUBIN URINE: NEGATIVE
Glucose, UA: NEGATIVE mg/dL
Hgb urine dipstick: NEGATIVE
Ketones, ur: NEGATIVE mg/dL
NITRITE: POSITIVE — AB
PH: 5 (ref 5.0–8.0)
Protein, ur: NEGATIVE mg/dL
SPECIFIC GRAVITY, URINE: 1.02 (ref 1.005–1.030)

## 2015-06-29 LAB — COMPREHENSIVE METABOLIC PANEL
ALT: 9 U/L — AB (ref 14–54)
ANION GAP: 9 (ref 5–15)
AST: 21 U/L (ref 15–41)
Albumin: 3.3 g/dL — ABNORMAL LOW (ref 3.5–5.0)
Alkaline Phosphatase: 80 U/L (ref 38–126)
BUN: 19 mg/dL (ref 6–20)
CO2: 23 mmol/L (ref 22–32)
Calcium: 8.6 mg/dL — ABNORMAL LOW (ref 8.9–10.3)
Chloride: 103 mmol/L (ref 101–111)
Creatinine, Ser: 1.14 mg/dL — ABNORMAL HIGH (ref 0.44–1.00)
GFR calc Af Amer: 48 mL/min — ABNORMAL LOW (ref >60)
GFR calc non Af Amer: 41 mL/min — ABNORMAL LOW (ref >60)
Glucose, Bld: 189 mg/dL — ABNORMAL HIGH (ref 65–99)
Potassium: 4.2 mmol/L (ref 3.5–5.1)
SODIUM: 135 mmol/L (ref 135–145)
Total Bilirubin: 0.4 mg/dL (ref 0.3–1.2)
Total Protein: 8 g/dL (ref 6.5–8.1)

## 2015-06-29 LAB — CBC
HCT: 37.5 % (ref 35.0–47.0)
HEMOGLOBIN: 12.5 g/dL (ref 12.0–16.0)
MCH: 30.1 pg (ref 26.0–34.0)
MCHC: 33.3 g/dL (ref 32.0–36.0)
MCV: 90.3 fL (ref 80.0–100.0)
Platelets: 244 10*3/uL (ref 150–440)
RBC: 4.15 MIL/uL (ref 3.80–5.20)
RDW: 14.3 % (ref 11.5–14.5)
WBC: 10.3 10*3/uL (ref 3.6–11.0)

## 2015-06-29 LAB — LIPASE, BLOOD: Lipase: 40 U/L (ref 11–51)

## 2015-06-29 MED ORDER — TRAMADOL HCL 50 MG PO TABS: 50.0000 mg | ORAL_TABLET | Freq: Four times a day (QID) | ORAL | Status: DC | PRN

## 2015-06-29 MED ORDER — CEPHALEXIN 500 MG PO CAPS: 500.0000 mg | ORAL_CAPSULE | Freq: Four times a day (QID) | ORAL | Status: AC

## 2015-06-29 NOTE — ED Notes (Signed)
PIV removed from R wrist.

## 2015-06-29 NOTE — ED Notes (Signed)
Discussed discharge instructions, prescriptions, and follow-up care with patient and care giver (daughter). No questions or concerns at this time. Pt stable at discharge.

## 2015-06-29 NOTE — ED Provider Notes (Signed)
Time Seen: Approximately ----------------------------------------- 5:56 PM on 06/29/2015 -----------------------------------------    I have reviewed the triage notes  Chief Complaint: Abdominal Pain   History of Present Illness: Sheena [name] is a 81 y.o. female who presents with a fall one week history of right-sided abdominal pain. Patient's also had some dry nonproductive cough at home. She denies any chest pain to this historian though her caretaker says that she had some right lower chest discomfort with coughing. Patient's been afebrile and denies any dysuria, hematuria, urinary frequency. No hemoptysis or wheezing at home. Pain is described as constant with acute exacerbations and seemed to have a lot of pain at home earlier today. Patient denies any change in her bowel habits with no loose stool, diarrhea, melena, hematochezia, constipation, diarrhea, etc. Patient denies much in way of significant pain at this time. Past surgical history includes a hysterectomy and appendectomy. Patient denies any nausea, vomiting, shortness of breath.   Past Medical History  Diagnosis Date  . H/O: hysterectomy 1973    fibroids  . Lumbago   . Left arm pain   . Diabetes mellitus, type 2 ([place])   . Melanoma ([place])     left arm, resected, annual CXR's ordered  . Asthma     treated by Dr. Donneta Romberg  . Depression   . Syncope 08/2013    Suspected vagal-mediated    Patient Active Problem List   Diagnosis Date Noted  . Back pain, thoracic 10/14/2013  . Mild cognitive impairment with memory loss 10/04/2013  . Syncope and collapse 08/26/2013  . Other and unspecified hyperlipidemia 05/14/2013  . Unspecified constipation 05/14/2013  . Osteoarthritis of left hip 01/11/2013  . History of syncope 11/15/2012  . Ankle fracture, left 11/15/2012  . H/O acute renal failure 11/15/2012  . Proximal muscle weakness 10/30/2012  . Neurotic excoriations 09/02/2012  . Loss of balance 06/06/2012  . Melanoma  ([place])   . Asthma   . Insomnia, persistent 09/01/2011  . Severe major depression with psychotic features ([place]) 09/01/2011  . Obesity (BMI 30-39.9) 09/01/2011  . Obesity, diabetes, and hypertension syndrome ([place]) 09/01/2011    Past Surgical History  Procedure Laterality Date  . Total hip arthroplasty  2003    left, Dr. Durward Fortes  . Breast biopsy  1985    normal  . Joint replacement   2003    Left Hip  . Abdominal hysterectomy  1973    fibroid uterus  . Transthoracic echocardiogram  08/23/13    EF 60-65%, borderline concentric LVH, impaired LV diastolic relaxation, LA ULN    Past Surgical History  Procedure Laterality Date  . Total hip arthroplasty  2003    left, Dr. Durward Fortes  . Breast biopsy  1985    normal  . Joint replacement   2003    Left Hip  . Abdominal hysterectomy  1973    fibroid uterus  . Transthoracic echocardiogram  08/23/13    EF 60-65%, borderline concentric LVH, impaired LV diastolic relaxation, LA ULN    Current Outpatient Rx  Name  Route  Sig  Dispense  Refill  . ASPIRIN LOW DOSE 81 MG EC tablet      TAKE ONE TABLET BY MOUTH EVERY DAY.   30 tablet   0     NEEDS OFFICE VISIT PRIOR TO FURTHER REFILLS, PLEAS ...   . atorvastatin (LIPITOR) 40 MG tablet      TAKE ONE TABLET BY MOUTH EVERY DAY.   30 tablet   0  NEEDS OFFICE VISIT PRIOR TO FURTHER REFILLS, PLEAS ...   . B-D ULTRAFINE III SHORT PEN 31G X 8 MM MISC      USE AS DIRECTED TO INJECT INSULIN.   100 each   5   . beta carotene w/minerals (OCUVITE) tablet      TAKE ONE TABLET BY MOUTH EVERY DAY.   30 tablet   5   . Blood Pressure KIT      Use daily to check blood pressure  401.9   1 each   0   . Calcium Carbonate (CALCIUM 500 PO)   Oral   Take by mouth.         . doxepin (SINEQUAN) 10 MG capsule   Oral   Take 1 capsule (10 mg total) by mouth at bedtime as needed.   30 capsule   2   . ergocalciferol (DRISDOL) 50000 UNITS capsule   Oral   Take 1 capsule (50,000  Units total) by mouth once a week.   4 capsule   2   . escitalopram (LEXAPRO) 20 MG tablet      TAKE (1) TABLET BY MOUTH DAILY.   30 tablet   0     NEEDS OFFICE VISIT PRIOR TO FURTHER REFILLS, PLEAS ...   . Fluticasone-Salmeterol (ADVAIR DISKUS) 100-50 MCG/DOSE AEPB   Inhalation   Inhale 1 puff into the lungs every 12 (twelve) hours.   60 each   11   . HYDROcodone-acetaminophen (NORCO/VICODIN) 5-325 MG per tablet   Oral   Take 1 tablet by mouth 2 (two) times daily as needed for moderate pain.   60 tablet   0   . hydrOXYzine (ATARAX/VISTARIL) 25 MG tablet   Oral   Take 25 mg by mouth daily.         . hydrOXYzine (ATARAX/VISTARIL) 25 MG tablet      TAKE 2 TABLETS (50 MG TOTAL) BY MOUTH EVERY 8 (EIGHT) HOURS AS NEEDED FOR ITCHING.   180 tablet   0   . insulin glargine (LANTUS) 100 UNIT/ML injection   Subcutaneous   Inject 0.16 mLs (16 Units total) into the skin at bedtime.   5 pen   3   . JANUVIA 100 MG tablet      TAKE ONE TABLET BY MOUTH ONCE DAILY.   30 tablet   5   . LANTUS SOLOSTAR 100 UNIT/ML Solostar Pen      INJECT 16 UNITS SUBCUTANEOUSLY AT BEDTIME.   15 mL   1   . loratadine (CLARITIN) 10 MG tablet      TAKE ONE TABLET BY MOUTH EVERY DAY.   30 tablet   3   . losartan (COZAAR) 50 MG tablet      TAKE 1 TABLET BY MOUTH ONCE A DAY.   30 tablet   0     NEEDS OFFICE VISIT PRIOR TO FURTHER REFILLS, PLEAS ...   . Multiple Vitamin (MULTIVITAMIN) tablet   Oral   Take 1 tablet by mouth daily.         . Omega-3 Fatty Acids (FISH OIL) 1000 MG CAPS      TAKE ONE CAPSULE BY MOUTH EVERY DAY.   30 capsule   11   . omega-3 fish oil (MAXEPA) 1000 MG CAPS capsule      TAKE ONE CAPSULE BY MOUTH EVERY DAY.   30 capsule   5   . omega-3 fish oil (MAXEPA) 1000 MG CAPS capsule      TAKE ONE  CAPSULE BY MOUTH EVERY DAY.   30 capsule   0     NEEDS OFFICE VISIT PRIOR TO FURTHER REFILLS, PLEAS ...   . omeprazole (PRILOSEC) 40 MG capsule       TAKE 1 CAPSULE BY MOUTH DAILY.   30 capsule   0     NEEDS OFFICE VISIT PRIOR TO FURTHER REFILLS, PLEAS ...   . oxyCODONE-acetaminophen (PERCOCET) 5-325 MG per tablet   Oral   Take 1 tablet by mouth every 6 (six) hours as needed.   10 tablet   0   . polyethylene glycol powder (GLYCOLAX/MIRALAX) powder   Oral   Take 17 g by mouth daily.   3350 g   1   . QUEtiapine (SEROQUEL) 50 MG tablet      TAKE (1) TABLET BY MOUTH TWICE DAILY.   60 tablet   5   . STOOL SOFTENER 100 MG capsule      TAKE (1) CAPSULE BY MOUTH TWICE DAILY.   60 capsule   3     Allergies:  Review of patient's allergies indicates no known allergies.  Family History: Family History  Problem Relation Age of Onset  . Diabetes Mother   . Heart disease Mother     CHF  . Heart disease Father   . Diabetes Father     Social History: Social History  Substance Use Topics  . Smoking status: Never Smoker   . Smokeless tobacco: Never Used  . Alcohol Use: No     Comment: occasional     Review of Systems:   10 point review of systems was performed and was otherwise negative:  Constitutional: No fever Eyes: No visual disturbances ENT: No sore throat, ear pain Cardiac: No chest pain Respiratory: No shortness of breath, wheezing, or stridor Abdomen patient mainly points to right side of the abdomen both upper and lower regions. She denies any left-sided abdominal pain., no vomiting, No diarrhea Endocrine: No weight loss, No night sweats Extremities: No peripheral edema, cyanosis Skin: No rashes, easy bruising Neurologic: No focal weakness, trouble with speech or swollowing Urologic: No dysuria, Hematuria, or urinary frequency   Physical Exam:  ED Triage Vitals  Enc Vitals Group     BP 06/29/15 1427 125/58 mmHg     Pulse Rate 06/29/15 1427 79     Resp 06/29/15 1427 20     Temp 06/29/15 1427 97.5 F (36.4 C)     Temp Source 06/29/15 1427 Oral     SpO2 06/29/15 1427 97 %     Weight 06/29/15  1427 230 lb (104.327 kg)     Height 06/29/15 1427 $RemoveBefor'5\' 7"'[id number]$  (1.702 m)     Head Cir --      Peak Flow --      Pain Score 06/29/15 1428 8     Pain Loc --      Pain Edu? --      Excl. in [place]? --     General: Awake , Alert , and Oriented times 3; GCS 15 Head: Normal cephalic , atraumatic Eyes: Pupils equal , round, reactive to light Nose/Throat: No nasal drainage, patent upper airway without erythema or exudate.  Neck: Supple, Full range of motion, No anterior adenopathy or palpable thyroid masses Lungs: Clear to ascultation without wheezes , rhonchi, or rales Heart: Regular rate, regular rhythm without murmurs , gallops , or rubs Abdomen: Soft, non tender without rebound, guarding , or rigidity; bowel sounds positive and symmetric in all 4 quadrants.  No organomegaly .        Extremities: 2 plus symmetric pulses. No edema, clubbing or cyanosis Neurologic: normal ambulation, Motor symmetric without deficits, sensory intact Skin: warm, dry, no rashes   Labs:   All laboratory work was reviewed including any pertinent negatives or positives listed below:  Labs Reviewed  COMPREHENSIVE METABOLIC PANEL - Abnormal; Notable for the following:    Glucose, Bld 189 (*)    Creatinine, Ser 1.14 (*)    Calcium 8.6 (*)    Albumin 3.3 (*)    ALT 9 (*)    GFR calc non Af Amer 41 (*)    GFR calc Af Amer 48 (*)    All other components within normal limits  LIPASE, BLOOD  CBC  URINALYSIS COMPLETEWITH MICROSCOPIC ([place])   review of the patient's laboratory work shows what appears to be a urinary tract infection Urine culture was added  EKG: * ED ECG REPORT I, [name], the attending physician, personally viewed and interpreted this ECG.  Date: 06/29/2015 EKG Time: 1414 Rate: 78 Rhythm: normal sinus rhythm QRS Axis: normal Intervals: normal ST/T Wave abnormalities: normal Conduction Disutrbances: none Narrative Interpretation: unremarkable Normal EKG     Radiology CT ABDOMEN  AND PELVIS WITHOUT CONTRAST  TECHNIQUE: Multidetector CT imaging of the abdomen and pelvis was performed following the standard protocol without IV contrast.  COMPARISON: 12/24/2014  FINDINGS: Lower chest: There is no pleural fluid identified. Interstitial reticulation and bronchiectasis noted in the right lower lobe. Likely sequelae of prior inflammation/infection.  Hepatobiliary: No suspicious liver abnormality. The gallbladder is normal. No biliary dilatation.  Pancreas: Normal appearance of the pancreas.  Spleen: Negative.  Adrenals/Urinary Tract: The adrenal glands are normal. Normal appearance of the right kidney. No right-sided nephrolithiasis or hydronephrosis. No hydroureter. The left kidney is also normal. No left-sided nephrolithiasis or hydroureter. No ureterolithiasis. The urinary bladder appears normal.  Stomach/Bowel: The stomach is normal. The small bowel loops have a normal course and caliber. Normal appearance of the proximal colon. There is multiple distal colonic diverticula noted without acute inflammation.  Vascular/Lymphatic: Calcified atherosclerotic disease involves the abdominal aorta. No aneurysm. No enlarged retroperitoneal or mesenteric adenopathy. No enlarged pelvic or inguinal lymph nodes.  Reproductive: Previous hysterectomy. No adnexal mass noted.  Other: There is no ascites or focal fluid collections within the abdomen or pelvis.  Musculoskeletal: Previous left hip arthroplasty. Degenerative disc disease noted within the lumbar spine. No aggressive lytic or sclerotic bone lesions.  IMPRESSION: 1. No acute findings identified within the abdomen or pelvis. 2. Aortic atherosclerosis. 3. Lumbar degenerative disc disease.   Electronically Signed By: Kerby Moors M.D. On: 06/29/2015 18:24          DG Chest 2 View (Final result) Result time: 06/29/15 17:36:25   Final result by Rad Results In Interface (06/29/15 17:36:25)    Narrative:   CLINICAL DATA: Right-sided abdominal pain. Right rib pain  EXAM: CHEST 2 VIEW  COMPARISON: 02/28/2015  FINDINGS: The heart size and mediastinal contours are within normal limits. Aortic atherosclerosis noted. Both lungs are clear. Spondylosis noted within the thoracic spine. No aggressive lytic or sclerotic bone lesion.  IMPRESSION: No active cardiopulmonary disease.   Electronically Signed By: Kerby Moors M.D. On: 06/29/2015 17:36     I personally reviewed the radiologic studies     ED Course:  Patient's stay here was uneventful and she was given IV Toradol with symptomatic improvement. Felt this was unlikely to be a surgical abdominal pain or  cough may be due to some bronchitis but there is no signs of pneumonia, pulmonary embolism, or any other serious cause for right-sided lower chest discomfort. Patient's CAT scan shows no surgical findings and she does have a urinary tract infection. Urine cultures pending and I prescribed the patient Keflex on an outpatient basis. Patient is advised drink plenty of fluids and was prescribed Ultram for pain QUESTIONS and concerns were addressed at the bedside with the family present.*    Assessment:  Urinary tract infection Right-sided lower chest discomfort unclear etiology possible bronchitis.  Final Clinical Impression:   Final diagnoses:  Right upper quadrant abdominal pain     Plan:  Patient was advised to return immediately if condition worsens. Patient was advised to follow up with her primary care physician or other specialized physicians involved and in their current assessment. Outpatient management           [name], MD 06/29/15 2040

## 2015-06-29 NOTE — Discharge Instructions (Signed)
Abdominal Pain, Adult Many things can cause abdominal pain. Usually, abdominal pain is not caused by a disease and will improve without treatment. It can often be observed and treated at home. Your health care provider will do a physical exam and possibly order blood tests and X-rays to help determine the seriousness of your pain. However, in many cases, more time must pass before a clear cause of the pain can be found. Before that point, your health care provider may not know if you need more testing or further treatment. HOME CARE INSTRUCTIONS Monitor your abdominal pain for any changes. The following actions may help to alleviate any discomfort you are experiencing:  Only take over-the-counter or prescription medicines as directed by your health care provider.  Do not take laxatives unless directed to do so by your health care provider.  Try a clear liquid diet (broth, tea, or water) as directed by your health care provider. Slowly move to a bland diet as tolerated. SEEK MEDICAL CARE IF:  You have unexplained abdominal pain.  You have abdominal pain associated with nausea or diarrhea.  You have pain when you urinate or have a bowel movement.  You experience abdominal pain that wakes you in the night.  You have abdominal pain that is worsened or improved by eating food.  You have abdominal pain that is worsened with eating fatty foods.  You have a fever. SEEK IMMEDIATE MEDICAL CARE IF:  Your pain does not go away within 2 hours.  You keep throwing up (vomiting).  Your pain is felt only in portions of the abdomen, such as the right side or the left lower portion of the abdomen.  You pass bloody or black tarry stools. MAKE SURE YOU:  Understand these instructions.  Will watch your condition.  Will get help right away if you are not doing well or get worse.   This information is not intended to replace advice given to you by your health care provider. Make sure you discuss  any questions you have with your health care provider.   Document Released: 05/06/2005 Document Revised: 04/17/2015 Document Reviewed: 04/05/2013 Elsevier Interactive Patient Education 2016 Else[employer]  Urinary Tract Infection Urinary tract infections (UTIs) can develop anywhere along your urinary tract. Your urinary tract is your body's drainage system for removing wastes and extra water. Your urinary tract includes two kidneys, two ureters, a bladder, and a urethra. Your kidneys are a pair of bean-shaped organs. Each kidney is about the size of your fist. They are located below your ribs, one on each side of your spine. CAUSES Infections are caused by microbes, which are microscopic organisms, including fungi, viruses, and bacteria. These organisms are so small that they can only be seen through a microscope. Bacteria are the microbes that most commonly cause UTIs. SYMPTOMS  Symptoms of UTIs may vary by age and gender of the patient and by the location of the infection. Symptoms in young women typically include a frequent and intense urge to urinate and a painful, burning feeling in the bladder or urethra during urination. Older women and men are more likely to be tired, shaky, and weak and have muscle aches and abdominal pain. A fever may mean the infection is in your kidneys. Other symptoms of a kidney infection include pain in your back or sides below the ribs, nausea, and vomiting. DIAGNOSIS To diagnose a UTI, your caregiver will ask you about your symptoms. Your caregiver will also ask you to provide a urine sample.  The urine sample will be tested for bacteria and white blood cells. White blood cells are made by your body to help fight infection. TREATMENT  Typically, UTIs can be treated with medication. Because most UTIs are caused by a bacterial infection, they usually can be treated with the use of antibiotics. The choice of antibiotic and length of treatment depend on your symptoms and  the type of bacteria causing your infection. HOME CARE INSTRUCTIONS  If you were prescribed antibiotics, take them exactly as your caregiver instructs you. Finish the medication even if you feel better after you have only taken some of the medication.  Drink enough water and fluids to keep your urine clear or pale yellow.  Avoid caffeine, tea, and carbonated beverages. They tend to irritate your bladder.  Empty your bladder often. Avoid holding urine for long periods of time.  Empty your bladder before and after sexual intercourse.  After a bowel movement, women should cleanse from front to back. Use each tissue only once. SEEK MEDICAL CARE IF:   You have back pain.  You develop a fever.  Your symptoms do not begin to resolve within 3 days. SEEK IMMEDIATE MEDICAL CARE IF:   You have severe back pain or lower abdominal pain.  You develop chills.  You have nausea or vomiting.  You have continued burning or discomfort with urination. MAKE SURE YOU:   Understand these instructions.  Will watch your condition.  Will get help right away if you are not doing well or get worse.   This information is not intended to replace advice given to you by your health care provider. Make sure you discuss any questions you have with your health care provider.   Document Released: 05/06/2005 Document Revised: 04/17/2015 Document Reviewed: 09/04/2011 Elsevier Interactive Patient Education [employer].  Please return immediately if condition worsens. Please contact her primary physician or the physician you were given for referral. If you have any specialist physicians involved in her treatment and plan please also contact them. Thank you for using [place] [facility]. Please drink plenty of fluids. Return emergency department especially for fever, persistent vomiting, increased shortness of breath, or any new concerns.

## 2015-06-29 NOTE — ED Notes (Signed)
Pt to ed with c/o right side abd pain in right rib area x 1 day.  Pt caregiver states pt has had cough, congestion x several days.  Reports she has used pain meds and anxiety meds today without relief.

## 2015-07-02 LAB — URINE CULTURE: Culture: >=100000

## 2015-07-23 ENCOUNTER — Ambulatory Visit
Admission: RE | Admit: 2015-07-23 | Discharge: 2015-07-23 | Disposition: A | Discharge status: Home / Self Care | Payer: Medicare Other | Source: Ambulatory Visit | Attending: Internal Medicine | Admitting: Internal Medicine

## 2015-07-23 ENCOUNTER — Other Ambulatory Visit: Payer: Self-pay | Admitting: Internal Medicine

## 2015-07-23 DIAGNOSIS — M545 Low back pain, unspecified: Secondary | ICD-10-CM

## 2015-07-23 DIAGNOSIS — M79641 Pain in right hand: Secondary | ICD-10-CM

## 2016-04-30 ENCOUNTER — Emergency Department (HOSPITAL_COMMUNITY)
Admission: EM | Admit: 2016-04-30 | Discharge: 2016-04-30 | Disposition: A | Discharge status: Home / Self Care | Payer: Medicare Other | Attending: Emergency Medicine | Admitting: Emergency Medicine

## 2016-04-30 ENCOUNTER — Emergency Department (HOSPITAL_COMMUNITY): Payer: Medicare Other

## 2016-04-30 ENCOUNTER — Encounter (HOSPITAL_COMMUNITY): Payer: Self-pay

## 2016-04-30 DIAGNOSIS — I10 Essential (primary) hypertension: Secondary | ICD-10-CM | POA: Diagnosis not present

## 2016-04-30 DIAGNOSIS — J45909 Unspecified asthma, uncomplicated: Secondary | ICD-10-CM | POA: Diagnosis not present

## 2016-04-30 DIAGNOSIS — E119 Type 2 diabetes mellitus without complications: Secondary | ICD-10-CM | POA: Insufficient documentation

## 2016-04-30 DIAGNOSIS — R05 Cough: Secondary | ICD-10-CM | POA: Diagnosis present

## 2016-04-30 DIAGNOSIS — N390 Urinary tract infection, site not specified: Secondary | ICD-10-CM | POA: Diagnosis not present

## 2016-04-30 DIAGNOSIS — B9789 Other viral agents as the cause of diseases classified elsewhere: Secondary | ICD-10-CM

## 2016-04-30 DIAGNOSIS — J069 Acute upper respiratory infection, unspecified: Secondary | ICD-10-CM | POA: Diagnosis not present

## 2016-04-30 DIAGNOSIS — Z79899 Other long term (current) drug therapy: Secondary | ICD-10-CM | POA: Diagnosis not present

## 2016-04-30 HISTORY — DX: Essential (primary) hypertension: I10

## 2016-04-30 LAB — CBC WITH DIFFERENTIAL/PLATELET
BASOS PCT: 0 %
Basophils Absolute: 0 10*3/uL (ref 0.0–0.1)
EOS PCT: 1 %
Eosinophils Absolute: 0.1 10*3/uL (ref 0.0–0.7)
HEMATOCRIT: 32.7 % — AB (ref 36.0–46.0)
Hemoglobin: 10.7 g/dL — ABNORMAL LOW (ref 12.0–15.0)
LYMPHS ABS: 1.3 10*3/uL (ref 0.7–4.0)
LYMPHS PCT: 15 %
MCH: 28.2 pg (ref 26.0–34.0)
MCHC: 32.7 g/dL (ref 30.0–36.0)
MCV: 86.3 fL (ref 78.0–100.0)
MONO ABS: 0.6 10*3/uL (ref 0.1–1.0)
MONOS PCT: 7 %
Neutro Abs: 6.6 10*3/uL (ref 1.7–7.7)
Neutrophils Relative %: 77 %
PLATELETS: 275 10*3/uL (ref 150–400)
RBC: 3.79 MIL/uL — ABNORMAL LOW (ref 3.87–5.11)
RDW: 14.6 % (ref 11.5–15.5)
WBC: 8.6 10*3/uL (ref 4.0–10.5)

## 2016-04-30 LAB — COMPREHENSIVE METABOLIC PANEL
ALT: 8 U/L — AB (ref 14–54)
AST: 14 U/L — AB (ref 15–41)
Albumin: 2.8 g/dL — ABNORMAL LOW (ref 3.5–5.0)
Alkaline Phosphatase: 60 U/L (ref 38–126)
Anion gap: 8 (ref 5–15)
BILIRUBIN TOTAL: 0.3 mg/dL (ref 0.3–1.2)
BUN: 18 mg/dL (ref 6–20)
CHLORIDE: 99 mmol/L — AB (ref 101–111)
CO2: 25 mmol/L (ref 22–32)
Calcium: 8.2 mg/dL — ABNORMAL LOW (ref 8.9–10.3)
Creatinine, Ser: 1.05 mg/dL — ABNORMAL HIGH (ref 0.44–1.00)
GFR calc Af Amer: 53 mL/min — ABNORMAL LOW (ref >60)
GFR, EST NON AFRICAN AMERICAN: 45 mL/min — AB (ref >60)
Glucose, Bld: 176 mg/dL — ABNORMAL HIGH (ref 65–99)
Potassium: 4 mmol/L (ref 3.5–5.1)
Sodium: 132 mmol/L — ABNORMAL LOW (ref 135–145)
Total Protein: 7.5 g/dL (ref 6.5–8.1)

## 2016-04-30 LAB — URINALYSIS, ROUTINE W REFLEX MICROSCOPIC
BILIRUBIN URINE: NEGATIVE
Glucose, UA: NEGATIVE mg/dL
Ketones, ur: NEGATIVE mg/dL
NITRITE: POSITIVE — AB
PH: 5.5 (ref 5.0–8.0)
Protein, ur: NEGATIVE mg/dL
SPECIFIC GRAVITY, URINE: 1.01 (ref 1.005–1.030)

## 2016-04-30 LAB — URINE MICROSCOPIC-ADD ON: RBC / HPF: NONE SEEN RBC/hpf (ref 0–5)

## 2016-04-30 LAB — I-STAT CG4 LACTIC ACID, ED: LACTIC ACID, VENOUS: 1.5 mmol/L (ref 0.5–1.9)

## 2016-04-30 LAB — LACTIC ACID, PLASMA: LACTIC ACID, VENOUS: 0.8 mmol/L (ref 0.5–1.9)

## 2016-04-30 MED ORDER — DOXYCYCLINE HYCLATE 100 MG PO TABS
100.0000 mg | ORAL_TABLET | Freq: Once | ORAL | Status: AC
  Administered 2016-04-30: 100 mg via ORAL
  Filled 2016-04-30: qty 1

## 2016-04-30 MED ORDER — DOXYCYCLINE HYCLATE 100 MG PO CAPS: 100.0000 mg | ORAL_CAPSULE | Freq: Two times a day (BID) | ORAL | 0 refills | Status: AC

## 2016-04-30 MED ORDER — CEPHALEXIN 500 MG PO CAPS: 500.0000 mg | ORAL_CAPSULE | Freq: Three times a day (TID) | ORAL | 0 refills | Status: AC

## 2016-04-30 MED ORDER — DOXYCYCLINE HYCLATE 100 MG PO CAPS: 100.0000 mg | ORAL_CAPSULE | Freq: Two times a day (BID) | ORAL | 0 refills | Status: DC

## 2016-04-30 MED ORDER — LEVOFLOXACIN 500 MG PO TABS: 500.0000 mg | ORAL_TABLET | Freq: Once | ORAL | Status: DC

## 2016-04-30 MED ORDER — CEPHALEXIN 500 MG PO CAPS
500.0000 mg | ORAL_CAPSULE | Freq: Three times a day (TID) | ORAL | 0 refills | Status: DC
Start: 2016-04-30 — End: 2016-04-30

## 2016-04-30 MED ORDER — CEPHALEXIN 500 MG PO CAPS
500.0000 mg | ORAL_CAPSULE | Freq: Once | ORAL | Status: AC
  Administered 2016-04-30: 500 mg via ORAL
  Filled 2016-04-30: qty 1

## 2016-04-30 NOTE — Discharge Instructions (Signed)
Fortunately, your chest X ray shows no pneumonia today Your heart is enlarged, however, and you should call your doctor to discuss further imaging and work up Otherwise, you have a mild UTI which we will treat here today If you develop fever, worsening breathing, low oxygen, or other concerning symptoms return immediately

## 2016-04-30 NOTE — ED Notes (Signed)
Report given to Butch Penny at [facility] assisted living and they are setting up transport back to their facility.

## 2016-04-30 NOTE — ED Provider Notes (Signed)
[facility] DEPT Provider Note   CSN: [id number] Arrival date & time: 04/30/16  [id number]  By signing my name below, I, Sheena [name], attest that this documentation has been prepared under the direction and in the presence of Duffy Bruce, MD. Electronically Signed: Johnney [name], ED Scribe. 04/30/16. 9:38 AM.   History   Chief Complaint Chief Complaint  Patient presents with  . Cough    HPI Comments: Sheena [name] is a 82 y.o. female with past medical history of asthma who presents to the Emergency Department with her caregiver complaining of cough and congestion that started one week ago. Cough is productive of light yellow mucous without blood with associated fatigue and postnasal drainage. Pt denies sick contacts or recent change in medications and is ambulatory using a walker. She lives in the [facility]. Pt does not use an inhaler for her asthma. Pt denies chest pain, leg swelling, loss of appetite, fever, chills, nausea, and abdominal pain.   The history is provided by the patient and a caregiver. No language interpreter was used.    Past Medical History:  Diagnosis Date  . Asthma    treated by Dr. Donneta Romberg  . Depression   . Diabetes mellitus, type 2 ([place])   . H/O: hysterectomy 1973   fibroids  . Hypertension   . Left arm pain   . Lumbago   . Melanoma ([place])    left arm, resected, annual CXR's ordered  . Syncope 08/2013   Suspected vagal-mediated    Patient Active Problem List   Diagnosis Date Noted  . Back pain, thoracic 10/14/2013  . Mild cognitive impairment with memory loss 10/04/2013  . Syncope and collapse 08/26/2013  . Other and unspecified hyperlipidemia 05/14/2013  . Unspecified constipation 05/14/2013  . Osteoarthritis of left hip 01/11/2013  . History of syncope 11/15/2012  . Ankle fracture, left 11/15/2012  . H/O acute renal failure 11/15/2012  . Proximal muscle weakness 10/30/2012  . Neurotic excoriations 09/02/2012    . Loss of balance 06/06/2012  . Melanoma ([place])   . Asthma   . Insomnia, persistent 09/01/2011  . Severe major depression with psychotic features ([place]) 09/01/2011  . Obesity (BMI 30-39.9) 09/01/2011  . Obesity, diabetes, and hypertension syndrome ([place]) 09/01/2011    Past Surgical History:  Procedure Laterality Date  . ABDOMINAL HYSTERECTOMY  1973   fibroid uterus  . BREAST BIOPSY  1985   normal  . JOINT REPLACEMENT   2003   Left Hip  . TOTAL HIP ARTHROPLASTY  2003   left, Dr. Durward Fortes  . TRANSTHORACIC ECHOCARDIOGRAM  08/23/13   EF 60-65%, borderline concentric LVH, impaired LV diastolic relaxation, LA ULN    OB History    No data available       Home Medications    Prior to Admission medications   Medication Sig Start Date End Date Taking? Authorizing Provider  ALPRAZolam [name]) 1 MG tablet Take 1 mg by mouth at bedtime as needed for anxiety.   Yes Historical Provider, MD  atorvastatin (LIPITOR) 40 MG tablet TAKE ONE TABLET BY MOUTH EVERY DAY. 06/12/14  Yes [name], MD  hydrOXYzine (ATARAX/VISTARIL) 25 MG tablet TAKE 2 TABLETS (50 MG TOTAL) BY MOUTH EVERY 8 (EIGHT) HOURS AS NEEDED FOR ITCHING. 04/23/14  Yes [name], MD  LANTUS SOLOSTAR 100 UNIT/ML Solostar Pen INJECT 16 UNITS SUBCUTANEOUSLY AT BEDTIME. Patient taking differently: INJECT 20 UNITS SUBCUTANEOUSLY TWICE A DAY 04/11/14  Yes [name], MD  QUEtiapine (  SEROQUEL) 25 MG tablet Take 25 mg by mouth 2 (two) times daily.   Yes Historical Provider, MD  sertraline (ZOLOFT) 50 MG tablet Take 50 mg by mouth daily.   Yes Historical Provider, MD  traMADol (ULTRAM) 50 MG tablet Take 1 tablet (50 mg total) by mouth every 6 (six) hours as needed. 06/29/15  Yes [name], MD  triamcinolone cream (KENALOG) 0.1 % Apply 1 application topically 2 (two) times daily.   Yes Historical Provider, MD  cephALEXin (KEFLEX) 500 MG capsule Take 1 capsule (500 mg total) by mouth 3 (three) times daily. 04/30/16 05/07/16   Duffy Bruce, MD  doxycycline (VIBRAMYCIN) 100 MG capsule Take 1 capsule (100 mg total) by mouth 2 (two) times daily. 04/30/16 05/07/16  Duffy Bruce, MD  JANUVIA 100 MG tablet TAKE ONE TABLET BY MOUTH ONCE DAILY. Patient not taking: Reported on 04/30/2016 07/11/14   [name], MD  losartan (COZAAR) 50 MG tablet TAKE 1 TABLET BY MOUTH ONCE A DAY. Patient not taking: Reported on 04/30/2016 06/12/14   [name], MD  omeprazole (PRILOSEC) 40 MG capsule TAKE 1 CAPSULE BY MOUTH DAILY. Patient not taking: Reported on 04/30/2016 06/12/14   [name], MD  QUEtiapine (SEROQUEL) 50 MG tablet TAKE (1) TABLET BY MOUTH TWICE DAILY. Patient not taking: Reported on 04/30/2016 07/11/14   [name], MD  STOOL SOFTENER 100 MG capsule TAKE (1) CAPSULE BY MOUTH TWICE DAILY. Patient not taking: Reported on 04/30/2016 10/31/14   [name], MD    Family History Family History  Problem Relation Age of Onset  . Diabetes Mother   . Heart disease Mother     CHF  . Heart disease Father   . Diabetes Father     Social History Social History  Substance Use Topics  . Smoking status: Never Smoker  . Smokeless tobacco: Never Used  . Alcohol use No     Comment: occasional     Allergies   Review of patient's allergies indicates no known allergies.   Review of Systems Review of Systems  Constitutional: Negative for appetite change, chills and fever.  HENT: Positive for congestion and postnasal drip. Negative for rhinorrhea and sore throat.   Eyes: Negative for visual disturbance.  Respiratory: Positive for cough. Negative for shortness of breath and wheezing.   Cardiovascular: Negative for chest pain and leg swelling.  Gastrointestinal: Negative for abdominal pain, diarrhea, nausea and vomiting.  Genitourinary: Negative for dysuria, flank pain, vaginal bleeding and vaginal discharge.  Musculoskeletal: Negative for neck pain.  Skin: Negative for rash.  Allergic/Immunologic: Negative for  immunocompromised state.  Neurological: Negative for syncope and headaches.  Hematological: Does not bruise/bleed easily.  All other systems reviewed and are negative.    Physical Exam Updated Vital Signs BP 154/58   Pulse 75   Temp 98.2 F (36.8 C) (Oral)   Resp 20   Ht 5\' 6"  (1.676 m)   Wt 232 lb (105.2 kg)   SpO2 94%   BMI 37.45 kg/m   Physical Exam  Constitutional: She is oriented to person, place, and time. She appears well-developed and well-nourished. No distress.  HENT:  Head: Normocephalic.  Mild nasal congestion and clear discharge. Posterior frontal erythema without tonsillar swelling or exudates. Oropharynx otherwise normal  Eyes: Conjunctivae are normal.  Neck: Neck supple.  Cardiovascular: Normal rate, regular rhythm and normal heart sounds.  Exam reveals no friction rub.   No murmur heard. Pulmonary/Chest: Effort normal and breath sounds  normal. No respiratory distress. She has no wheezes. She has no rales.  Abdominal: She exhibits no distension.  Minimal, distractible tenderness in the suprapubic area. No rebound or guarding.  Musculoskeletal: She exhibits no edema.  Neurological: She is alert and oriented to person, place, and time. She exhibits normal muscle tone.  Skin: Skin is warm. Capillary refill takes less than 2 seconds.  Psychiatric: She has a normal mood and affect.  Nursing note and vitals reviewed.    ED Treatments / Results   DIAGNOSTIC STUDIES: Oxygen Saturation is 95% on RA, adequate by my interpretation.    COORDINATION OF CARE: 9:21 AM Discussed treatment plan with pt at bedside and pt agreed to plan.   Labs (all labs ordered are listed, but only abnormal results are displayed) Labs Reviewed  CBC WITH DIFFERENTIAL/PLATELET - Abnormal; Notable for the following:       Result Value   RBC 3.79 (*)    Hemoglobin 10.7 (*)    HCT 32.7 (*)    All other components within normal limits  COMPREHENSIVE METABOLIC PANEL - Abnormal;  Notable for the following:    Sodium 132 (*)    Chloride 99 (*)    Glucose, Bld 176 (*)    Creatinine, Ser 1.05 (*)    Calcium 8.2 (*)    Albumin 2.8 (*)    AST 14 (*)    ALT 8 (*)    GFR calc non Af Amer 45 (*)    GFR calc Af Amer 53 (*)    All other components within normal limits  URINALYSIS, ROUTINE W REFLEX MICROSCOPIC (NOT AT [facility]) - Abnormal; Notable for the following:    Hgb urine dipstick TRACE (*)    Nitrite POSITIVE (*)    Leukocytes, UA SMALL (*)    All other components within normal limits  URINE MICROSCOPIC-ADD ON - Abnormal; Notable for the following:    Squamous Epithelial / LPF TOO NUMEROUS TO COUNT (*)    Bacteria, UA MANY (*)    All other components within normal limits  URINE CULTURE  LACTIC ACID, PLASMA  I-STAT CG4 LACTIC ACID, ED    EKG  EKG Interpretation  Date/Time:  Thursday April 30 2016 14:42:50 EDT Ventricular Rate:  78 PR Interval:    QRS Duration: 84 QT Interval:  410 QTC Calculation: 467 R Axis:   -17 Text Interpretation:  Sinus rhythm Left ventricular hypertrophy No significant change since last tracing Confirmed by Daekwon Beswick MD, Lysbeth Galas [id number]) on 04/30/2016 2:44:38 PM       Radiology Dg Chest 2 View  Result Date: 04/30/2016 CLINICAL DATA:  Productive cough. EXAM: CHEST  2 VIEW COMPARISON:  Chest x-ray 06/29/2015. FINDINGS: Mediastinum and hilar structures normal. Cardiomegaly with normal pulmonary vascularity. Low lung volumes with mild bibasilar atelectasis and/or infiltrates. Surgical clips left axilla. IMPRESSION: 1. Low lung volumes with mild bibasilar atelectasis and/or infiltrates. 2. Cardiomegaly. 3.  Mild cardiomegaly. Electronically Signed   By: Marcello Moores  Register   On: 04/30/2016 09:53    Procedures Procedures (including critical care time)  Medications Ordered in ED Medications  doxycycline (VIBRA-TABS) tablet 100 mg (100 mg Oral Given 04/30/16 1458)  cephALEXin (KEFLEX) capsule 500 mg (500 mg Oral Given 04/30/16 1458)      Initial Impression / Assessment and Plan / ED Course  I have reviewed the triage vital signs and the nursing notes.  Pertinent labs & imaging results that were available during my care of the patient were reviewed by me and considered  in my medical decision making (see chart for details).  Clinical Course    82 yo F with PMHx of DM2, HTN, who presents with a 1-week h/o cough, nasal congestion, and rhinorrhea. On arrival, pt overall very well-appearing and in NAD, with normal WOB. Denies complaints at this time and is AOx3. Primary suspicion is viral URI with ongoing cough. Pt has ? H/o asthma but no wheezing - doubt asthma exacerbation. PNA less likely given absence of any fevers, overall well appearance, and normal O2 sats but will check CXR. Will o/w check broad screening lab. Pt does live in a facility with known sick contacts with viral URIs. No LE edema or signs of DVT/PE, and pt has no h/o CHF and is not hypervolemic on exam. Regarding her mild SP TTP, will check UA. Abdomen is o/w soft and she is without complaints - doubt appendicitis, cholecystitis, or other significant intra-abdominal pathology. No vomiting or diarrhea.  Labs, imaging as above. CXR shows atelectasis with trace effusions but no PNA, PTX. She does have cardiomegaly but no note of pulmonary edema/interstitial edema. Labs o/w very reassuring. LA normal x 2. CBC without leukoctosis. BMP is at baseline. Pt ambulatory in ED without difficulty with normal O2 sats. UA + WBCs, RBCs, but also TNTC squams - will check culture.   Discussed labs, findings with pt. She feels well and would like to go home which is reasonable. Will treat for possible UTI as well as respiratory infection with doxy/cephalex based on prior urine cultures, and d/c with instructions for PCP f/u in 48 hr, good return precautions.  Final Clinical Impressions(s) / ED Diagnoses   Final diagnoses:  Viral URI with cough  UTI (lower urinary tract infection)     I personally performed the services described in this documentation, which was scribed in my presence. The recorded information has been reviewed and is accurate.    Duffy Bruce, MD 05/01/16 [phone]

## 2016-04-30 NOTE — ED Notes (Signed)
PT ambulated with 2 person assist with NAD noted. No SOB noted upon ambulation.

## 2016-04-30 NOTE — ED Triage Notes (Signed)
Pt reports resident of [facility] and has had congestion for the past few days.  Denies pain or fever.  Reports cough is productive at times with light yellow sputum.

## 2016-05-03 LAB — URINE CULTURE: Culture: >=100000 — AB

## 2016-05-04 ENCOUNTER — Telehealth (HOSPITAL_BASED_OUTPATIENT_CLINIC_OR_DEPARTMENT_OTHER): Payer: Self-pay | Admitting: Emergency Medicine

## 2016-05-04 NOTE — Telephone Encounter (Addendum)
Post ED Visit - Positive Culture Follow-up  Culture report reviewed by antimicrobial stewardship pharmacist:  []  Elenor Quinones, Pharm.D. []  Heide Guile, Pharm.D., BCPS []  Parks Neptune, Pharm.D. []  Alycia Rossetti, Pharm.D., BCPS []  [place], [place].D., BCPS, AAHIVP []  Legrand Como, Pharm.D., BCPS, AAHIVP []  Milus Glazier, Pharm.D. []  Rob Evette Doffing, Pharm.D. Dimitri Ped PharmD  Positive urine culture Treated with doxycycline and cephalexin, sensitive to same and no further patient follow-up is required at this time.  Hazle Nordmann 05/04/2016, 10:51 AM

## 2016-08-12 ENCOUNTER — Other Ambulatory Visit: Payer: Self-pay | Admitting: Internal Medicine

## 2016-08-12 ENCOUNTER — Ambulatory Visit
Admission: RE | Admit: 2016-08-12 | Discharge: 2016-08-12 | Disposition: A | Discharge status: Home / Self Care | Payer: Medicare Other | Source: Ambulatory Visit | Attending: Internal Medicine | Admitting: Internal Medicine

## 2016-08-12 DIAGNOSIS — R51 Headache: Principal | ICD-10-CM

## 2016-08-12 DIAGNOSIS — R519 Headache, unspecified: Secondary | ICD-10-CM

## 2016-12-11 ENCOUNTER — Encounter (HOSPITAL_COMMUNITY): Payer: Self-pay | Admitting: Cardiology

## 2016-12-11 ENCOUNTER — Observation Stay (HOSPITAL_COMMUNITY)
Admission: EM | Admit: 2016-12-11 | Discharge: 2016-12-12 | Disposition: A | Discharge status: Home / Self Care | Payer: Medicare Other | Attending: Family Medicine | Admitting: Family Medicine

## 2016-12-11 ENCOUNTER — Emergency Department (HOSPITAL_COMMUNITY): Payer: Medicare Other

## 2016-12-11 DIAGNOSIS — J45909 Unspecified asthma, uncomplicated: Secondary | ICD-10-CM | POA: Insufficient documentation

## 2016-12-11 DIAGNOSIS — Z79899 Other long term (current) drug therapy: Secondary | ICD-10-CM | POA: Insufficient documentation

## 2016-12-11 DIAGNOSIS — R1031 Right lower quadrant pain: Secondary | ICD-10-CM | POA: Diagnosis present

## 2016-12-11 DIAGNOSIS — K8020 Calculus of gallbladder without cholecystitis without obstruction: Secondary | ICD-10-CM | POA: Diagnosis not present

## 2016-12-11 DIAGNOSIS — I10 Essential (primary) hypertension: Secondary | ICD-10-CM | POA: Insufficient documentation

## 2016-12-11 DIAGNOSIS — R109 Unspecified abdominal pain: Secondary | ICD-10-CM

## 2016-12-11 DIAGNOSIS — E119 Type 2 diabetes mellitus without complications: Secondary | ICD-10-CM | POA: Diagnosis not present

## 2016-12-11 DIAGNOSIS — G3184 Mild cognitive impairment, so stated: Secondary | ICD-10-CM | POA: Diagnosis not present

## 2016-12-11 DIAGNOSIS — R1011 Right upper quadrant pain: Secondary | ICD-10-CM | POA: Diagnosis not present

## 2016-12-11 LAB — CBC WITH DIFFERENTIAL/PLATELET
Basophils Absolute: 0 10*3/uL (ref 0.0–0.1)
Basophils Relative: 0 %
Eosinophils Absolute: 0.2 10*3/uL (ref 0.0–0.7)
Eosinophils Relative: 1 %
HCT: 38.4 % (ref 36.0–46.0)
HEMOGLOBIN: 12.8 g/dL (ref 12.0–15.0)
LYMPHS ABS: 1.4 10*3/uL (ref 0.7–4.0)
LYMPHS PCT: 12 %
MCH: 29 pg (ref 26.0–34.0)
MCHC: 33.3 g/dL (ref 30.0–36.0)
MCV: 86.9 fL (ref 78.0–100.0)
Monocytes Absolute: 0.9 10*3/uL (ref 0.1–1.0)
Monocytes Relative: 8 %
NEUTROS ABS: 9.2 10*3/uL — AB (ref 1.7–7.7)
NEUTROS PCT: 79 %
Platelets: 221 10*3/uL (ref 150–400)
RBC: 4.42 MIL/uL (ref 3.87–5.11)
RDW: 14.8 % (ref 11.5–15.5)
WBC: 11.7 10*3/uL — AB (ref 4.0–10.5)

## 2016-12-11 LAB — PROTIME-INR
INR: 1.01
Prothrombin Time: 13.3 seconds (ref 11.4–15.2)

## 2016-12-11 LAB — I-STAT CG4 LACTIC ACID, ED: Lactic Acid, Venous: 1.06 mmol/L (ref 0.5–1.9)

## 2016-12-11 LAB — HEPATIC FUNCTION PANEL
ALBUMIN: 3.4 g/dL — AB (ref 3.5–5.0)
ALK PHOS: 94 U/L (ref 38–126)
ALT: 20 U/L (ref 14–54)
AST: 21 U/L (ref 15–41)
BILIRUBIN TOTAL: 0.6 mg/dL (ref 0.3–1.2)
Bilirubin, Direct: <0.1 mg/dL — ABNORMAL LOW (ref 0.1–0.5)
TOTAL PROTEIN: 8 g/dL (ref 6.5–8.1)

## 2016-12-11 LAB — I-STAT CHEM 8, ED
BUN: 18 mg/dL (ref 6–20)
CHLORIDE: 100 mmol/L — AB (ref 101–111)
CREATININE: 1.3 mg/dL — AB (ref 0.44–1.00)
Calcium, Ion: 1.11 mmol/L — ABNORMAL LOW (ref 1.15–1.40)
GLUCOSE: 150 mg/dL — AB (ref 65–99)
HCT: 38 % (ref 36.0–46.0)
Hemoglobin: 12.9 g/dL (ref 12.0–15.0)
POTASSIUM: 4 mmol/L (ref 3.5–5.1)
Sodium: 137 mmol/L (ref 135–145)
TCO2: 27 mmol/L (ref 0–100)

## 2016-12-11 LAB — URINALYSIS, ROUTINE W REFLEX MICROSCOPIC
Bilirubin Urine: NEGATIVE
Glucose, UA: NEGATIVE mg/dL
Hgb urine dipstick: NEGATIVE
KETONES UR: NEGATIVE mg/dL
LEUKOCYTES UA: NEGATIVE
NITRITE: NEGATIVE
PH: 7 (ref 5.0–8.0)
Protein, ur: NEGATIVE mg/dL
SPECIFIC GRAVITY, URINE: 1.016 (ref 1.005–1.030)

## 2016-12-11 LAB — LIPASE, BLOOD: Lipase: 15 U/L (ref 11–51)

## 2016-12-11 LAB — APTT: aPTT: 40 seconds — ABNORMAL HIGH (ref 24–36)

## 2016-12-11 MED ORDER — IOPAMIDOL (ISOVUE-300) INJECTION 61%
100.0000 mL | Freq: Once | INTRAVENOUS | Status: AC | PRN
  Administered 2016-12-11: 80 mL via INTRAVENOUS

## 2016-12-11 MED ORDER — ACETAMINOPHEN 325 MG PO TABS
650.0000 mg | ORAL_TABLET | Freq: Four times a day (QID) | ORAL | Status: DC | PRN
  Administered 2016-12-12: 650 mg via ORAL
  Filled 2016-12-11: qty 2

## 2016-12-11 MED ORDER — HEPARIN SODIUM (PORCINE) 5000 UNIT/ML IJ SOLN
5000.0000 [IU] | Freq: Three times a day (TID) | INTRAMUSCULAR | Status: DC
  Administered 2016-12-11 – 2016-12-12 (×4): 5000 [IU] via SUBCUTANEOUS
  Filled 2016-12-11 (×4): qty 1

## 2016-12-11 MED ORDER — IOPAMIDOL (ISOVUE-300) INJECTION 61%
15.0000 mL | Freq: Once | INTRAVENOUS | Status: AC | PRN
  Administered 2016-12-11: 15 mL via ORAL

## 2016-12-11 MED ORDER — QUETIAPINE FUMARATE 25 MG PO TABS
25.0000 mg | ORAL_TABLET | Freq: Two times a day (BID) | ORAL | Status: DC
  Administered 2016-12-11 – 2016-12-12 (×3): 25 mg via ORAL
  Filled 2016-12-11 (×3): qty 1

## 2016-12-11 MED ORDER — ACETAMINOPHEN 650 MG RE SUPP: 650.0000 mg | Freq: Four times a day (QID) | RECTAL | Status: DC | PRN

## 2016-12-11 MED ORDER — ALPRAZOLAM 1 MG PO TABS
1.0000 mg | ORAL_TABLET | Freq: Three times a day (TID) | ORAL | Status: DC | PRN
  Administered 2016-12-11: 1 mg via ORAL
  Filled 2016-12-11: qty 1

## 2016-12-11 MED ORDER — FENTANYL CITRATE (PF) 100 MCG/2ML IJ SOLN
50.0000 ug | INTRAMUSCULAR | Status: DC | PRN
  Administered 2016-12-11 (×3): 50 ug via INTRAVENOUS
  Filled 2016-12-11 (×4): qty 2

## 2016-12-11 MED ORDER — SERTRALINE HCL 50 MG PO TABS
50.0000 mg | ORAL_TABLET | Freq: Every day | ORAL | Status: DC
  Administered 2016-12-11 – 2016-12-12 (×2): 50 mg via ORAL
  Filled 2016-12-11 (×2): qty 1

## 2016-12-11 NOTE — H&P (Signed)
History and Physical    BILLIEJO SORTO ERX:[id number] DOB: 05/19/1924 DOA: 12/11/2016  PCP: Myriam Jacobson, MD   Patient coming from: Home  Chief Complaint: Abdominal Pain  HPI: Sheena [name] is a 83 y.o. female with medical history significant of depression, DM Type II, HTN that presents with abdominal pain that started last night.  Pain was located on the right side in the middle abdomen.  Nothing made pain worse.  Pain radiated up to patient's shoulder and around to her back.  No fevers or chills.  Patient's son is bedside and states patient has been more tired than normal and has sounded weaker on the phone.  Besides sounding weaker no exact symptoms were noted.  Patient denies eating anything abnormal.  Denies emesis.  Never had pain like this before.  Did not try anything to improve pain.  ED Course: Workup included CT which showed cholelithiasis but not cholecystitis.  Laboratory data showed Cr of 1.30 (baseline ~1.1-1.3) normal LFT's, WBC of 11.7, normal UA.  Dr. Arnoldo Morale of General Surgery consulted for cholelithiasis.  Review of Systems: As per HPI otherwise 10 point review of systems negative.    Past Medical History:  Diagnosis Date  . Asthma    treated by Dr. Donneta Romberg  . Depression   . Diabetes mellitus, type 2 ([place])   . H/O: hysterectomy 1973   fibroids  . Hypertension   . Left arm pain   . Lumbago   . Melanoma ([place])    left arm, resected, annual CXR's ordered  . Syncope 08/2013   Suspected vagal-mediated    Past Surgical History:  Procedure Laterality Date  . ABDOMINAL HYSTERECTOMY  1973   fibroid uterus  . BREAST BIOPSY  1985   normal  . JOINT REPLACEMENT   2003   Left Hip  . TOTAL HIP ARTHROPLASTY  2003   left, Dr. Durward Fortes  . TRANSTHORACIC ECHOCARDIOGRAM  08/23/13   EF 60-65%, borderline concentric LVH, impaired LV diastolic relaxation, LA ULN     reports that she has never smoked. She has never used smokeless tobacco. She reports that she does  not drink alcohol or use drugs.  No Known Allergies  Family History  Problem Relation Age of Onset  . Diabetes Mother   . Heart disease Mother     CHF  . Heart disease Father   . Diabetes Father     Prior to Admission medications   Medication Sig Start Date End Date Taking? Authorizing Provider  ALPRAZolam [name]) 1 MG tablet Take 1 mg by mouth 3 (three) times daily as needed for anxiety or sleep.    Yes Historical Provider, MD  atorvastatin (LIPITOR) 40 MG tablet TAKE ONE TABLET BY MOUTH EVERY DAY. 06/12/14  Yes [name], MD  hydrOXYzine (VISTARIL) 50 MG capsule Take 50 mg by mouth 3 (three) times daily as needed for itching.   Yes Historical Provider, MD  LANTUS SOLOSTAR 100 UNIT/ML Solostar Pen INJECT 16 UNITS SUBCUTANEOUSLY AT BEDTIME. Patient taking differently: INJECT 20 UNITS SUBCUTANEOUSLY TWICE A DAY 04/11/14  Yes [name], MD  QUEtiapine (SEROQUEL) 25 MG tablet Take 25 mg by mouth 2 (two) times daily.   Yes Historical Provider, MD  sertraline (ZOLOFT) 50 MG tablet Take 50 mg by mouth daily.   Yes Historical Provider, MD  traMADol (ULTRAM) 50 MG tablet Take 1 tablet (50 mg total) by mouth every 6 (six) hours as needed. 06/29/15  Yes [name], MD  triamcinolone  cream (KENALOG) 0.1 % Apply 1 application topically 2 (two) times daily.   Yes Historical Provider, MD  hydrOXYzine (ATARAX/VISTARIL) 25 MG tablet TAKE 2 TABLETS (50 MG TOTAL) BY MOUTH EVERY 8 (EIGHT) HOURS AS NEEDED FOR ITCHING. Patient not taking: Reported on 12/11/2016 04/23/14   [name], MD    Physical Exam: Vitals:   12/11/16 1100 12/11/16 1130 12/11/16 1200 12/11/16 1255  BP: (!) 162/82 (!) 160/109 (!) 162/70 (!) 150/71  Pulse: 72 73 70 77  Resp: 15 14 16 17   Temp:    98.8 F (37.1 C)  TempSrc:    Oral  SpO2: 95% 95% 96% 93%  Weight:    105.3 kg (232 lb 3.2 oz)  Height:    5\' 5"  (1.651 m)      Constitutional: NAD, calm, comfortable Vitals:   12/11/16 1100 12/11/16 1130 12/11/16  1200 12/11/16 1255  BP: (!) 162/82 (!) 160/109 (!) 162/70 (!) 150/71  Pulse: 72 73 70 77  Resp: 15 14 16 17   Temp:    98.8 F (37.1 C)  TempSrc:    Oral  SpO2: 95% 95% 96% 93%  Weight:    105.3 kg (232 lb 3.2 oz)  Height:    5\' 5"  (1.651 m)   Eyes: PERRL, lids and conjunctivae normal ENMT: Mucous membranes are moist. Posterior pharynx clear of any exudate or lesions.Normal dentition.  Hard of hearing Neck: normal, supple, no masses, no thyromegaly Respiratory: clear to auscultation bilaterally, no wheezing, no crackles. Normal respiratory effort. No accessory muscle use.  Cardiovascular: Regular rate and rhythm, no murmurs / rubs / gallops. No extremity edema. 2+ pedal pulses. No carotid bruits.  Abdomen: tenderness on deep palpation of right upper abdomen, no masses palpated. No hepatosplenomegaly. Bowel sounds positive.  Musculoskeletal: no clubbing / cyanosis. No joint deformity upper and lower extremities. Good ROM, no contractures. Normal muscle tone.  Skin: no rashes, lesions, ulcers. No induration Neurologic: CN 2-12 grossly intact- except that patient is hard of hearing. Sensation intact, DTR normal. Strength 5/5 in all 4.  Psychiatric: Alert and oriented to self, situation. Normal mood.     Labs on Admission: I have personally reviewed following labs and imaging studies  CBC:  Recent Labs Lab 12/11/16 0740 12/11/16 0749  WBC 11.7*  --   NEUTROABS 9.2*  --   HGB 12.8 12.9  HCT 38.4 38.0  MCV 86.9  --   PLT 221  --    Basic Metabolic Panel:  Recent Labs Lab 12/11/16 0749  NA 137  K 4.0  CL 100*  GLUCOSE 150*  BUN 18  CREATININE 1.30*   GFR: Estimated Creatinine Clearance: 34.6 mL/min (A) (by C-G formula based on SCr of 1.3 mg/dL (H)). Liver Function Tests:  Recent Labs Lab 12/11/16 0740  AST 21  ALT 20  ALKPHOS 94  BILITOT 0.6  PROT 8.0  ALBUMIN 3.4*    Recent Labs Lab 12/11/16 0740  LIPASE 15   No results for input(s): AMMONIA in the last  168 hours. Coagulation Profile:  Recent Labs Lab 12/11/16 0740  INR 1.01   Cardiac Enzymes: No results for input(s): CKTOTAL, CKMB, CKMBINDEX, TROPONINI in the last 168 hours. BNP (last 3 results) No results for input(s): PROBNP in the last 8760 hours. HbA1C: No results for input(s): HGBA1C in the last 72 hours. CBG: No results for input(s): GLUCAP in the last 168 hours. Lipid Profile: No results for input(s): CHOL, HDL, LDLCALC, TRIG, CHOLHDL, LDLDIRECT in the last 72  hours. Thyroid Function Tests: No results for input(s): TSH, T4TOTAL, FREET4, T3FREE, THYROIDAB in the last 72 hours. Anemia Panel: No results for input(s): VITAMINB12, FOLATE, FERRITIN, TIBC, IRON, RETICCTPCT in the last 72 hours. Urine analysis:    Component Value Date/Time   COLORURINE STRAW (A) 12/11/2016 1010   APPEARANCEUR CLEAR 12/11/2016 1010   APPEARANCEUR Clear 03/05/2014 2034   LABSPEC 1.016 12/11/2016 1010   LABSPEC 1.010 03/05/2014 2034   PHURINE 7.0 12/11/2016 1010   GLUCOSEU NEGATIVE 12/11/2016 1010   GLUCOSEU Negative 03/05/2014 2034   HGBUR NEGATIVE 12/11/2016 1010   BILIRUBINUR NEGATIVE 12/11/2016 1010   BILIRUBINUR Negative 03/05/2014 2034   KETONESUR NEGATIVE 12/11/2016 1010   PROTEINUR NEGATIVE 12/11/2016 1010   UROBILINOGEN 0.2 06/13/2013 1148   NITRITE NEGATIVE 12/11/2016 1010   LEUKOCYTESUR NEGATIVE 12/11/2016 1010   LEUKOCYTESUR 1+ 03/05/2014 2034   Sepsis Labs: !!!!!!!!!!!!!!!!!!!!!!!!!!!!!!!!!!!!!!!!!!!! @LABRCNTIP (procalcitonin:4,lacticidven:4) )No results found for this or any previous visit (from the past 240 hour(s)).   Radiological Exams on Admission: Ct Abdomen Pelvis W Contrast  Result Date: 12/11/2016 CLINICAL DATA:  83 year old female with a history of right-sided abdominal pain for 1 day. EXAM: CT ABDOMEN AND PELVIS WITH CONTRAST TECHNIQUE: Multidetector CT imaging of the abdomen and pelvis was performed using the standard protocol following bolus administration of  intravenous contrast. CONTRAST:  1mL ISOVUE-300 IOPAMIDOL (ISOVUE-300) INJECTION 61%, 47mL ISOVUE-300 IOPAMIDOL (ISOVUE-300) INJECTION 61% COMPARISON:  CT 06/29/2015, 12/24/2014 FINDINGS: Lower chest: Respiratory motion at the lung base. Nonspecific linear and nodular opacities, likely a combination of scarring/atelectasis. No pleural effusions. Bronchiectasis. Calcifications of the thoracic aorta. Hepatobiliary: Unremarkable appearance of liver. Single calcified focus of the gallbladder, compatible with cholelithiasis. No evidence of inflammatory changes. No intrahepatic or extrahepatic biliary ductal dilatation. Pancreas: Unremarkable appearance of the pancreas. Spleen: Unremarkable spleen Adrenals/Urinary Tract: Unremarkable bilateral adrenal glands. Bilateral kidneys symmetric perfusion. No evidence of left or right-sided hydronephrosis. No nephrolithiasis. Unremarkable course of the bilateral ureters. Unremarkable appearance of the urinary bladder, partially distended. Stomach/Bowel: Small hiatal hernia. No abnormally distended stomach or small bowel. No transition point. Enteric contrast reaches distal small bowel. No focal inflammatory changes. Appendix is not visualized, however, no inflammatory changes are present adjacent to the cecum to indicate an appendicitis. Colonic diverticular disease with no associated inflammatory changes. No significant stool burden. Vascular/Lymphatic: Calcifications of abdominal aorta. No aneurysm. Bilateral iliac vessels patent. Proximal femoral vasculature appears patent. Reproductive: Hysterectomy Other: Small fact containing umbilical hernia. Musculoskeletal: No displaced fracture. Surgical changes of left hip arthroplasty. Multilevel degenerative changes of the spine. No bony canal narrowing. Mild degenerative changes of the right hip. IMPRESSION: No acute finding to account for the patient's symptoms of abdominal pain. Cholelithiasis without evidence of acute  cholecystitis. Diverticular disease without evidence of acute diverticulitis. Aortic atherosclerosis. Degenerative changes of lumbar spine. Electronically Signed   By: Corrie Mckusick D.O.   On: 12/11/2016 10:00   US Abdomen Limited Ruq  Result Date: 12/11/2016 CLINICAL DATA:  Abdominal pain for 1 day EXAM: US ABDOMEN LIMITED - RIGHT UPPER QUADRANT COMPARISON:  CT 12/11/2016 FINDINGS: Gallbladder: Small non mobile echogenic foci along the gallbladder wall, likely small polyps, 2.3 mm. Small stone, 5 mm. No wall thickening. Negative sonographic Murphy's. Common bile duct: Diameter: Normal caliber, 4 mm Liver: No focal lesion identified. Within normal limits in parenchymal echogenicity. IMPRESSION: Small gallstone without changes of acute cholecystitis. 1-2 small gallbladder wall polyps. No acute findings. Electronically Signed   By: Rolm Baptise M.D.   On: 12/11/2016 12:05  EKG: Independently reviewed. Sinus rhythm  Assessment/Plan Principal Problem:   Cholelithiasis Active Problems:   Mild cognitive impairment with memory loss   Abdominal pain     Cholelithiasis - Dr. Arnoldo Morale consulted - will repeat CMP in am - currently not septic and laboratory data now displaying typical signs of cholecystitis - clear liquid diet - heparin  Mild cognitive impairment with memory loss - appears to be at baseline - continue home medications  Abdominal Pain - likely from cholelithiasis   DVT prophylaxis: Heparin  Code Status: Full Code  Family Communication:  Son and daughter bedside  Disposition Plan: likely discharge back home when medically stable Consults called: General Surgery- [place]  Admission status:  obs- med surg   Loretha Stapler MD [facility] Pager [phone]  If 7PM-7AM, please contact night-coverage [email or web address] Password [email or web address]  12/11/2016, 1:06 PM

## 2016-12-11 NOTE — ED Notes (Signed)
Placed on 2 liters [place]

## 2016-12-11 NOTE — ED Triage Notes (Signed)
Right flank pain since last night

## 2016-12-11 NOTE — ED Provider Notes (Addendum)
[facility] DEPT Provider Note   CSN: [id number] Arrival date & time: 12/11/16  4098     History   Chief Complaint Chief Complaint  Patient presents with  . Flank Pain    HPI Sheena [name] is a 83 y.o. female.  HPI  Pt with past medical hx of DM, hysterectomy, HTN comes in with cc of abd pain. PT's abd pain is RLQ and constant + sharp since last night. The pain is radiating to RUQ and to her R shoulder. PT has no associated n/v/f/c. Pt has no anterior chest pain, dib, cough. Pt denies any urinary discomfort, or bloody urine.  Past Medical History:  Diagnosis Date  . Asthma    treated by Dr. Donneta Romberg  . Depression   . Diabetes mellitus, type 2 ([place])   . H/O: hysterectomy 1973   fibroids  . Hypertension   . Left arm pain   . Lumbago   . Melanoma ([place])    left arm, resected, annual CXR's ordered  . Syncope 08/2013   Suspected vagal-mediated    Patient Active Problem List   Diagnosis Date Noted  . Cholelithiasis 12/11/2016  . Back pain, thoracic 10/14/2013  . Mild cognitive impairment with memory loss 10/04/2013  . Syncope and collapse 08/26/2013  . Other and unspecified hyperlipidemia 05/14/2013  . Unspecified constipation 05/14/2013  . Osteoarthritis of left hip 01/11/2013  . History of syncope 11/15/2012  . Ankle fracture, left 11/15/2012  . H/O acute renal failure 11/15/2012  . Proximal muscle weakness 10/30/2012  . Neurotic excoriations 09/02/2012  . Loss of balance 06/06/2012  . Melanoma ([place])   . Asthma   . Insomnia, persistent 09/01/2011  . Severe major depression with psychotic features ([place]) 09/01/2011  . Obesity (BMI 30-39.9) 09/01/2011  . Obesity, diabetes, and hypertension syndrome ([place]) 09/01/2011    Past Surgical History:  Procedure Laterality Date  . ABDOMINAL HYSTERECTOMY  1973   fibroid uterus  . BREAST BIOPSY  1985   normal  . JOINT REPLACEMENT   2003   Left Hip  . TOTAL HIP ARTHROPLASTY  2003   left, Dr. Durward Fortes  .  TRANSTHORACIC ECHOCARDIOGRAM  08/23/13   EF 60-65%, borderline concentric LVH, impaired LV diastolic relaxation, LA ULN    OB History    No data available       Home Medications    Prior to Admission medications   Medication Sig Start Date End Date Taking? Authorizing Provider  ALPRAZolam [name]) 1 MG tablet Take 1 mg by mouth 3 (three) times daily as needed for anxiety or sleep.    Yes Historical Provider, MD  atorvastatin (LIPITOR) 40 MG tablet TAKE ONE TABLET BY MOUTH EVERY DAY. 06/12/14  Yes [name], MD  hydrOXYzine (VISTARIL) 50 MG capsule Take 50 mg by mouth 3 (three) times daily as needed for itching.   Yes Historical Provider, MD  LANTUS SOLOSTAR 100 UNIT/ML Solostar Pen INJECT 16 UNITS SUBCUTANEOUSLY AT BEDTIME. Patient taking differently: INJECT 20 UNITS SUBCUTANEOUSLY TWICE A DAY 04/11/14  Yes [name], MD  QUEtiapine (SEROQUEL) 25 MG tablet Take 25 mg by mouth 2 (two) times daily.   Yes Historical Provider, MD  sertraline (ZOLOFT) 50 MG tablet Take 50 mg by mouth daily.   Yes Historical Provider, MD  traMADol (ULTRAM) 50 MG tablet Take 1 tablet (50 mg total) by mouth every 6 (six) hours as needed. 06/29/15  Yes [name], MD  triamcinolone cream (KENALOG) 0.1 % Apply 1 application topically  2 (two) times daily.   Yes Historical Provider, MD  hydrOXYzine (ATARAX/VISTARIL) 25 MG tablet TAKE 2 TABLETS (50 MG TOTAL) BY MOUTH EVERY 8 (EIGHT) HOURS AS NEEDED FOR ITCHING. Patient not taking: Reported on 12/11/2016 04/23/14   [name], MD    Family History Family History  Problem Relation Age of Onset  . Diabetes Mother   . Heart disease Mother     CHF  . Heart disease Father   . Diabetes Father     Social History Social History  Substance Use Topics  . Smoking status: Never Smoker  . Smokeless tobacco: Never Used  . Alcohol use No     Comment: occasional     Allergies   Patient has no known allergies.   Review of Systems Review of Systems    Constitutional: Negative for diaphoresis.  HENT: Negative for facial swelling.   Respiratory: Negative for cough and shortness of breath.   Cardiovascular: Negative for chest pain.  Gastrointestinal: Positive for abdominal pain. Negative for abdominal distention, blood in stool, diarrhea, nausea and vomiting.  Genitourinary: Negative for dysuria and hematuria.  Musculoskeletal: Positive for arthralgias. Negative for neck pain.  Skin: Negative for color change.  Allergic/Immunologic: Negative for immunocompromised state.  Neurological: Negative for speech difficulty.  Hematological: Does not bruise/bleed easily.  Psychiatric/Behavioral: Negative for confusion.     Physical Exam Updated Vital Signs BP (!) 162/70   Pulse 70   Temp 98 F (36.7 C) (Oral)   Resp 16   Ht 5\' 6"  (1.676 m)   Wt 232 lb (105.2 kg)   SpO2 96%   BMI 37.45 kg/m   Physical Exam  Constitutional: She is oriented to person, place, and time. She appears well-developed and well-nourished.  HENT:  Head: Normocephalic and atraumatic.  Eyes: EOM are normal. Pupils are equal, round, and reactive to light.  Neck: Neck supple.  Cardiovascular: Normal rate, regular rhythm and normal heart sounds.   No murmur heard. Pulmonary/Chest: Effort normal. No respiratory distress.  Abdominal: Soft. She exhibits no distension. There is tenderness. There is guarding. There is no rebound.  Guarding over mcburney's. PT also has tenderness over the RUQ, neg Murphy's.  Neurological: She is alert and oriented to person, place, and time.  Skin: Skin is warm and dry.  Nursing note and vitals reviewed.    ED Treatments / Results  Labs (all labs ordered are listed, but only abnormal results are displayed) Labs Reviewed  APTT - Abnormal; Notable for the following:       Result Value   aPTT 40 (*)    All other components within normal limits  CBC WITH DIFFERENTIAL/PLATELET - Abnormal; Notable for the following:    WBC 11.7 (*)     Neutro Abs 9.2 (*)    All other components within normal limits  URINALYSIS, ROUTINE W REFLEX MICROSCOPIC - Abnormal; Notable for the following:    Color, Urine STRAW (*)    All other components within normal limits  HEPATIC FUNCTION PANEL - Abnormal; Notable for the following:    Albumin 3.4 (*)    Bilirubin, Direct <0.1 (*)    All other components within normal limits  I-STAT CHEM 8, ED - Abnormal; Notable for the following:    Chloride 100 (*)    Creatinine, Ser 1.30 (*)    Glucose, Bld 150 (*)    Calcium, Ion 1.11 (*)    All other components within normal limits  PROTIME-INR  LIPASE, BLOOD  I-STAT CG4  LACTIC ACID, ED    EKG  EKG Interpretation  Date/Time:  Friday Dec 11 2016 07:51:44 EDT Ventricular Rate:  76 PR Interval:    QRS Duration: 92 QT Interval:  398 QTC Calculation: 448 R Axis:   -20 Text Interpretation:  Sinus rhythm Borderline left axis deviation No acute changes No significant change since last tracing Confirmed by Kathrynn Humble, MD, Thelma Comp [phone]) on 12/11/2016 8:37:35 AM       Radiology Ct Abdomen Pelvis W Contrast  Result Date: 12/11/2016 CLINICAL DATA:  83 year old female with a history of right-sided abdominal pain for 1 day. EXAM: CT ABDOMEN AND PELVIS WITH CONTRAST TECHNIQUE: Multidetector CT imaging of the abdomen and pelvis was performed using the standard protocol following bolus administration of intravenous contrast. CONTRAST:  7mL ISOVUE-300 IOPAMIDOL (ISOVUE-300) INJECTION 61%, 58mL ISOVUE-300 IOPAMIDOL (ISOVUE-300) INJECTION 61% COMPARISON:  CT 06/29/2015, 12/24/2014 FINDINGS: Lower chest: Respiratory motion at the lung base. Nonspecific linear and nodular opacities, likely a combination of scarring/atelectasis. No pleural effusions. Bronchiectasis. Calcifications of the thoracic aorta. Hepatobiliary: Unremarkable appearance of liver. Single calcified focus of the gallbladder, compatible with cholelithiasis. No evidence of inflammatory changes. No  intrahepatic or extrahepatic biliary ductal dilatation. Pancreas: Unremarkable appearance of the pancreas. Spleen: Unremarkable spleen Adrenals/Urinary Tract: Unremarkable bilateral adrenal glands. Bilateral kidneys symmetric perfusion. No evidence of left or right-sided hydronephrosis. No nephrolithiasis. Unremarkable course of the bilateral ureters. Unremarkable appearance of the urinary bladder, partially distended. Stomach/Bowel: Small hiatal hernia. No abnormally distended stomach or small bowel. No transition point. Enteric contrast reaches distal small bowel. No focal inflammatory changes. Appendix is not visualized, however, no inflammatory changes are present adjacent to the cecum to indicate an appendicitis. Colonic diverticular disease with no associated inflammatory changes. No significant stool burden. Vascular/Lymphatic: Calcifications of abdominal aorta. No aneurysm. Bilateral iliac vessels patent. Proximal femoral vasculature appears patent. Reproductive: Hysterectomy Other: Small fact containing umbilical hernia. Musculoskeletal: No displaced fracture. Surgical changes of left hip arthroplasty. Multilevel degenerative changes of the spine. No bony canal narrowing. Mild degenerative changes of the right hip. IMPRESSION: No acute finding to account for the patient's symptoms of abdominal pain. Cholelithiasis without evidence of acute cholecystitis. Diverticular disease without evidence of acute diverticulitis. Aortic atherosclerosis. Degenerative changes of lumbar spine. Electronically Signed   By: Corrie Mckusick D.O.   On: 12/11/2016 10:00   US Abdomen Limited Ruq  Result Date: 12/11/2016 CLINICAL DATA:  Abdominal pain for 1 day EXAM: US ABDOMEN LIMITED - RIGHT UPPER QUADRANT COMPARISON:  CT 12/11/2016 FINDINGS: Gallbladder: Small non mobile echogenic foci along the gallbladder wall, likely small polyps, 2.3 mm. Small stone, 5 mm. No wall thickening. Negative sonographic Murphy's. Common bile duct:  Diameter: Normal caliber, 4 mm Liver: No focal lesion identified. Within normal limits in parenchymal echogenicity. IMPRESSION: Small gallstone without changes of acute cholecystitis. 1-2 small gallbladder wall polyps. No acute findings. Electronically Signed   By: Rolm Baptise M.D.   On: 12/11/2016 12:05    Procedures Procedures (including critical care time)  Medications Ordered in ED Medications  fentaNYL (SUBLIMAZE) injection 50 mcg (50 mcg Intravenous Given 12/11/16 1108)  iopamidol (ISOVUE-300) 61 % injection 15 mL (15 mLs Oral Contrast Given 12/11/16 0936)  iopamidol (ISOVUE-300) 61 % injection 100 mL (80 mLs Intravenous Contrast Given 12/11/16 0935)     Initial Impression / Assessment and Plan / ED Course  I have reviewed the triage vital signs and the nursing notes.  Pertinent labs & imaging results that were available during my care of the  patient were reviewed by me and considered in my medical decision making (see chart for details).  Clinical Course as of Dec 12 1223  Fri Dec 11, 2016  1054 Results from the ER workup discussed with the patient face to face and all questions answered to the best of my ability.  Appendix not visualized. However, on repeat exam there is no tenderness on the RLQ like she did before. Pt is having persistent tenderness in the RUQ. Still neg Murphy's. Concerns for symptomatic cholelithiasis. We will call surgery. CT Abdomen Pelvis W Contrast [AN]  50 Dr. Arnoldo Morale recommended hospitalist admission with clear liquid diet and he will assess patient tomorrow morning.  [AN]  1225 Results from the ER workup discussed with the patient face to face and all questions answered to the best of my ability.   [AN]    Clinical Course User Index [AN] Varney Biles, MD    Pt comes in with abd pain.  DDx includes: Pancreatitis Hepatobiliary pathology including cholecystitis Gastritis/PUD SBO ACS syndrome AAA Tumors Intra abdominal  abscess Thrombosis Mesenteric ischemia Diverticulitis Peritonitis Appendicitis Hernia Nephrolithiasis Pyelonephritis UTI/Cystitis  Based on the hx and exam though - appendicitis, hepatobiliary disease and AAA are highest in the ddx. With the age and DM hx, mesenteric ischemia also considered, however, pain is fairly localized making it less likely. We will get CT abdomen pelvis. EKG also ordered to screen for ACS and also for potential pre-surgical clearance. Pain meds given. NPO. 0 SIRS at arrival.      Final Clinical Impressions(s) / ED Diagnoses   Final diagnoses:  Abdominal pain  Symptomatic cholelithiasis    New Prescriptions Current Discharge Medication List       Varney Biles, MD 12/11/16 [place], MD 12/11/16 1225

## 2016-12-12 DIAGNOSIS — K8020 Calculus of gallbladder without cholecystitis without obstruction: Secondary | ICD-10-CM

## 2016-12-12 DIAGNOSIS — R1011 Right upper quadrant pain: Secondary | ICD-10-CM | POA: Diagnosis not present

## 2016-12-12 DIAGNOSIS — G3184 Mild cognitive impairment, so stated: Secondary | ICD-10-CM | POA: Diagnosis not present

## 2016-12-12 LAB — COMPREHENSIVE METABOLIC PANEL
ALBUMIN: 3.3 g/dL — AB (ref 3.5–5.0)
ALK PHOS: 75 U/L (ref 38–126)
ALT: 18 U/L (ref 14–54)
AST: 17 U/L (ref 15–41)
Anion gap: 7 (ref 5–15)
BILIRUBIN TOTAL: 1 mg/dL (ref 0.3–1.2)
BUN: 13 mg/dL (ref 6–20)
CALCIUM: 8.5 mg/dL — AB (ref 8.9–10.3)
CO2: 25 mmol/L (ref 22–32)
Chloride: 100 mmol/L — ABNORMAL LOW (ref 101–111)
Creatinine, Ser: 1.03 mg/dL — ABNORMAL HIGH (ref 0.44–1.00)
GFR calc Af Amer: 54 mL/min — ABNORMAL LOW (ref >60)
GFR calc non Af Amer: 46 mL/min — ABNORMAL LOW (ref >60)
GLUCOSE: 147 mg/dL — AB (ref 65–99)
POTASSIUM: 3.7 mmol/L (ref 3.5–5.1)
Sodium: 132 mmol/L — ABNORMAL LOW (ref 135–145)
TOTAL PROTEIN: 7.6 g/dL (ref 6.5–8.1)

## 2016-12-12 LAB — CBC WITH DIFFERENTIAL/PLATELET
BASOS ABS: 0 10*3/uL (ref 0.0–0.1)
Basophils Relative: 0 %
EOS PCT: 3 %
Eosinophils Absolute: 0.2 10*3/uL (ref 0.0–0.7)
HCT: 37.5 % (ref 36.0–46.0)
Hemoglobin: 12.4 g/dL (ref 12.0–15.0)
LYMPHS PCT: 18 %
Lymphs Abs: 1.3 10*3/uL (ref 0.7–4.0)
MCH: 28.8 pg (ref 26.0–34.0)
MCHC: 33.1 g/dL (ref 30.0–36.0)
MCV: 87 fL (ref 78.0–100.0)
MONO ABS: 1 10*3/uL (ref 0.1–1.0)
Monocytes Relative: 14 %
Neutro Abs: 4.6 10*3/uL (ref 1.7–7.7)
Neutrophils Relative %: 65 %
PLATELETS: 216 10*3/uL (ref 150–400)
RBC: 4.31 MIL/uL (ref 3.87–5.11)
RDW: 14.7 % (ref 11.5–15.5)
WBC: 7 10*3/uL (ref 4.0–10.5)

## 2016-12-12 NOTE — Progress Notes (Signed)
IV removed, WNL. D/C instructions given to pt. Verbalized understanding. Pt family here to transport back to assisted living.

## 2016-12-12 NOTE — Discharge Summary (Signed)
Physician Discharge Summary  Sheena [name] DGL:[id number] DOB: 1923-11-15 DOA: 12/11/2016  PCP: Lorene Dy, MD  Admit date: 12/11/2016 Discharge date: 12/12/2016  Admitted From: ALF Disposition:  ALF  Recommendations for Outpatient Follow-up:  1. Follow up with PCP in 1-2 weeks 2. Please obtain BMP/CBC in one week  Home Health: No Equipment/Devices: None  Discharge Condition: Stable CODE STATUS: Full code Diet recommendation: Heart Healthy  Brief/Interim Summary: Sheena [name] is a 83 y.o. female with medical history significant of depression, DM Type II, HTN that presents with abdominal pain that started last night.  Pain was located on the right side in the middle abdomen.  Nothing made pain worse.  Pain radiated up to patient's shoulder and around to her back.  No fevers or chills.  Patient's son is bedside and states patient has been more tired than normal and has sounded weaker on the phone.  Besides sounding weaker no exact symptoms were noted.  Patient denies eating anything abnormal.  Denies emesis.  Never had pain like this before.  Did not try anything to improve pain.  Patient was placed on observation and monitored overnight.  Repeat LFT's were WNL.  She was evaluated by general surgery who did not feel as though patient was a surgical candidate.  She was started on a regular diet and tolerated this and was stable for discharge.  Discharge Diagnoses:  Principal Problem:   Cholelithiasis Active Problems:   Mild cognitive impairment with memory loss   Abdominal pain    Discharge Instructions  Discharge Instructions    Call MD for:  difficulty breathing, headache or visual disturbances    Complete by:  As directed    Call MD for:  extreme fatigue    Complete by:  As directed    Call MD for:  hives    Complete by:  As directed    Call MD for:  persistant dizziness or light-headedness    Complete by:  As directed    Call MD for:  persistant nausea and vomiting     Complete by:  As directed    Call MD for:  severe uncontrolled pain    Complete by:  As directed    Call MD for:  temperature >100.4    Complete by:  As directed    Diet - low sodium heart healthy    Complete by:  As directed    Increase activity slowly    Complete by:  As directed      Allergies as of 12/12/2016   No Known Allergies     Medication List    STOP taking these medications   hydrOXYzine 25 MG tablet Commonly known as:  ATARAX/VISTARIL     TAKE these medications   ALPRAZolam 1 MG tablet Commonly known as:  XANAX Take 1 mg by mouth 3 (three) times daily as needed for anxiety or sleep.   atorvastatin 40 MG tablet Commonly known as:  LIPITOR TAKE ONE TABLET BY MOUTH EVERY DAY.   hydrOXYzine 50 MG capsule Commonly known as:  VISTARIL Take 50 mg by mouth 3 (three) times daily as needed for itching.   LANTUS SOLOSTAR 100 UNIT/ML Solostar Pen Generic drug:  Insulin Glargine INJECT 16 UNITS SUBCUTANEOUSLY AT BEDTIME. What changed:  See the new instructions.   QUEtiapine 25 MG tablet Commonly known as:  SEROQUEL Take 25 mg by mouth 2 (two) times daily.   sertraline 50 MG tablet Commonly known as:  ZOLOFT Take 50 mg by  mouth daily.   traMADol 50 MG tablet Commonly known as:  ULTRAM Take 1 tablet (50 mg total) by mouth every 6 (six) hours as needed.   triamcinolone cream 0.1 % Commonly known as:  KENALOG Apply 1 application topically 2 (two) times daily.       No Known Allergies  Consultations:  General Surgery   Procedures/Studies: Ct Abdomen Pelvis W Contrast  Result Date: 12/11/2016 CLINICAL DATA:  83 year old female with a history of right-sided abdominal pain for 1 day. EXAM: CT ABDOMEN AND PELVIS WITH CONTRAST TECHNIQUE: Multidetector CT imaging of the abdomen and pelvis was performed using the standard protocol following bolus administration of intravenous contrast. CONTRAST:  34mL ISOVUE-300 IOPAMIDOL (ISOVUE-300) INJECTION 61%, 18mL  ISOVUE-300 IOPAMIDOL (ISOVUE-300) INJECTION 61% COMPARISON:  CT 06/29/2015, 12/24/2014 FINDINGS: Lower chest: Respiratory motion at the lung base. Nonspecific linear and nodular opacities, likely a combination of scarring/atelectasis. No pleural effusions. Bronchiectasis. Calcifications of the thoracic aorta. Hepatobiliary: Unremarkable appearance of liver. Single calcified focus of the gallbladder, compatible with cholelithiasis. No evidence of inflammatory changes. No intrahepatic or extrahepatic biliary ductal dilatation. Pancreas: Unremarkable appearance of the pancreas. Spleen: Unremarkable spleen Adrenals/Urinary Tract: Unremarkable bilateral adrenal glands. Bilateral kidneys symmetric perfusion. No evidence of left or right-sided hydronephrosis. No nephrolithiasis. Unremarkable course of the bilateral ureters. Unremarkable appearance of the urinary bladder, partially distended. Stomach/Bowel: Small hiatal hernia. No abnormally distended stomach or small bowel. No transition point. Enteric contrast reaches distal small bowel. No focal inflammatory changes. Appendix is not visualized, however, no inflammatory changes are present adjacent to the cecum to indicate an appendicitis. Colonic diverticular disease with no associated inflammatory changes. No significant stool burden. Vascular/Lymphatic: Calcifications of abdominal aorta. No aneurysm. Bilateral iliac vessels patent. Proximal femoral vasculature appears patent. Reproductive: Hysterectomy Other: Small fact containing umbilical hernia. Musculoskeletal: No displaced fracture. Surgical changes of left hip arthroplasty. Multilevel degenerative changes of the spine. No bony canal narrowing. Mild degenerative changes of the right hip. IMPRESSION: No acute finding to account for the patient's symptoms of abdominal pain. Cholelithiasis without evidence of acute cholecystitis. Diverticular disease without evidence of acute diverticulitis. Aortic atherosclerosis.  Degenerative changes of lumbar spine. Electronically Signed   By: Corrie Mckusick D.O.   On: 12/11/2016 10:00   US Abdomen Limited Ruq  Result Date: 12/11/2016 CLINICAL DATA:  Abdominal pain for 1 day EXAM: US ABDOMEN LIMITED - RIGHT UPPER QUADRANT COMPARISON:  CT 12/11/2016 FINDINGS: Gallbladder: Small non mobile echogenic foci along the gallbladder wall, likely small polyps, 2.3 mm. Small stone, 5 mm. No wall thickening. Negative sonographic Murphy's. Common bile duct: Diameter: Normal caliber, 4 mm Liver: No focal lesion identified. Within normal limits in parenchymal echogenicity. IMPRESSION: Small gallstone without changes of acute cholecystitis. 1-2 small gallbladder wall polyps. No acute findings. Electronically Signed   By: Rolm Baptise M.D.   On: 12/11/2016 12:05      Subjective: Patient asleep at time of initial exam.  Patient's daughter reports that last night was a difficult night and patient was awake and very anxious through most of it.  Patient daughter states patient was very relieved to hear Dr. Arnoldo Morale say she did not need surgery.    Discharge Exam: Vitals:   12/11/16 2300 12/12/16 0453  BP: (!) 149/66 (!) 144/79  Pulse: 99 75  Resp: 18 18  Temp: 98.9 F (37.2 C) 98 F (36.7 C)   Vitals:   12/11/16 1200 12/11/16 1255 12/11/16 2300 12/12/16 0453  BP: (!) 162/70 (!) 150/71 (!) 149/66 Marland Kitchen)  144/79  Pulse: 70 77 99 75  Resp: 16 17 18 18   Temp:  98.8 F (37.1 C) 98.9 F (37.2 C) 98 F (36.7 C)  TempSrc:  Oral Oral Oral  SpO2: 96% 93% 92% 94%  Weight:  105.3 kg (232 lb 3.2 oz)    Height:  5\' 5"  (1.651 m)      General: Pt is alert, awake, not in acute distress Cardiovascular: RRR, S1/S2 +, no rubs, no gallops Respiratory: CTA bilaterally, no wheezing, no rhonchi Abdominal: Soft, NT, ND, bowel sounds + Extremities: no edema, no cyanosis    The results of significant diagnostics from this hospitalization (including imaging, microbiology, ancillary and laboratory) are  listed below for reference.     Microbiology: No results found for this or any previous visit (from the past 240 hour(s)).   Labs: BNP (last 3 results) No results for input(s): BNP in the last 8760 hours. Basic Metabolic Panel:  Recent Labs Lab 12/11/16 0749 12/12/16 0558  NA 137 132*  K 4.0 3.7  CL 100* 100*  CO2  --  25  GLUCOSE 150* 147*  BUN 18 13  CREATININE 1.30* 1.03*  CALCIUM  --  8.5*   Liver Function Tests:  Recent Labs Lab 12/11/16 0740 12/12/16 0558  AST 21 17  ALT 20 18  ALKPHOS 94 75  BILITOT 0.6 1.0  PROT 8.0 7.6  ALBUMIN 3.4* 3.3*    Recent Labs Lab 12/11/16 0740  LIPASE 15   No results for input(s): AMMONIA in the last 168 hours. CBC:  Recent Labs Lab 12/11/16 0740 12/11/16 0749 12/12/16 0558  WBC 11.7*  --  7.0  NEUTROABS 9.2*  --  4.6  HGB 12.8 12.9 12.4  HCT 38.4 38.0 37.5  MCV 86.9  --  87.0  PLT 221  --  216   Cardiac Enzymes: No results for input(s): CKTOTAL, CKMB, CKMBINDEX, TROPONINI in the last 168 hours. BNP: Invalid input(s): POCBNP CBG: No results for input(s): GLUCAP in the last 168 hours. D-Dimer No results for input(s): DDIMER in the last 72 hours. Hgb A1c No results for input(s): HGBA1C in the last 72 hours. Lipid Profile No results for input(s): CHOL, HDL, LDLCALC, TRIG, CHOLHDL, LDLDIRECT in the last 72 hours. Thyroid function studies No results for input(s): TSH, T4TOTAL, T3FREE, THYROIDAB in the last 72 hours.  Invalid input(s): FREET3 Anemia work up No results for input(s): VITAMINB12, FOLATE, FERRITIN, TIBC, IRON, RETICCTPCT in the last 72 hours. Urinalysis    Component Value Date/Time   COLORURINE STRAW (A) 12/11/2016 1010   APPEARANCEUR CLEAR 12/11/2016 1010   APPEARANCEUR Clear 03/05/2014 2034   LABSPEC 1.016 12/11/2016 1010   LABSPEC 1.010 03/05/2014 2034   PHURINE 7.0 12/11/2016 1010   GLUCOSEU NEGATIVE 12/11/2016 1010   GLUCOSEU Negative 03/05/2014 2034   HGBUR NEGATIVE 12/11/2016 1010    BILIRUBINUR NEGATIVE 12/11/2016 1010   BILIRUBINUR Negative 03/05/2014 2034   KETONESUR NEGATIVE 12/11/2016 1010   PROTEINUR NEGATIVE 12/11/2016 1010   UROBILINOGEN 0.2 06/13/2013 1148   NITRITE NEGATIVE 12/11/2016 1010   LEUKOCYTESUR NEGATIVE 12/11/2016 1010   LEUKOCYTESUR 1+ 03/05/2014 2034   Sepsis Labs Invalid input(s): PROCALCITONIN,  WBC,  LACTICIDVEN Microbiology No results found for this or any previous visit (from the past 240 hour(s)).   Time coordinating discharge: 30 minutes  SIGNED:   Loretha Stapler, MD  [facility] 12/12/2016, 1:57 PM Pager [phone] If 7PM-7AM, please contact night-coverage [email or web address] Password [email or web address]

## 2016-12-12 NOTE — Consult Note (Signed)
Reason for Consult: Cholelithiasis Referring Physician: Dr. Terrilee Croak is an 83 y.o. female.  HPI: Patient is a 83 year old white female nursing home patient who was seen in the emergency room yesterday for right-sided abdominal pain. History is somewhat limited, though it sounds like the pain radiated to her back. No fever or chills were noted. No nausea or vomiting were noted. Family reports that the patient seemed a little under the weather over the past few days. CT scan of the abdomen revealed cholelithiasis. Ultrasound revealed cholelithiasis with a normal common bile duct, and negative Murphy's sign. The patient was admitted to the hospital for observation. She apparently has not had significant pain since her admission. No fever, chills, jaundice have been noted. No fatty food intolerance has been noted.  Past Medical History:  Diagnosis Date  . Asthma    treated by Dr. Donneta Romberg  . Depression   . Diabetes mellitus, type 2 ([place])   . H/O: hysterectomy 1973   fibroids  . Hypertension   . Left arm pain   . Lumbago   . Melanoma ([place])    left arm, resected, annual CXR's ordered  . Syncope 08/2013   Suspected vagal-mediated    Past Surgical History:  Procedure Laterality Date  . ABDOMINAL HYSTERECTOMY  1973   fibroid uterus  . BREAST BIOPSY  1985   normal  . JOINT REPLACEMENT   2003   Left Hip  . TOTAL HIP ARTHROPLASTY  2003   left, Dr. Durward Fortes  . TRANSTHORACIC ECHOCARDIOGRAM  08/23/13   EF 60-65%, borderline concentric LVH, impaired LV diastolic relaxation, LA ULN    Family History  Problem Relation Age of Onset  . Diabetes Mother   . Heart disease Mother     CHF  . Heart disease Father   . Diabetes Father     Social History:  reports that she has never smoked. She has never used smokeless tobacco. She reports that she does not drink alcohol or use drugs.  Allergies: No Known Allergies  Medications:  Prior to Admission:  Prescriptions Prior to  Admission  Medication Sig Dispense Refill Last Dose  . ALPRAZolam (XANAX) 1 MG tablet Take 1 mg by mouth 3 (three) times daily as needed for anxiety or sleep.    Past Month at Unknown time  . atorvastatin (LIPITOR) 40 MG tablet TAKE ONE TABLET BY MOUTH EVERY DAY. 30 tablet 0 12/10/2016 at Unknown time  . hydrOXYzine (VISTARIL) 50 MG capsule Take 50 mg by mouth 3 (three) times daily as needed for itching.     Marland Kitchen LANTUS SOLOSTAR 100 UNIT/ML Solostar Pen INJECT 16 UNITS SUBCUTANEOUSLY AT BEDTIME. (Patient taking differently: INJECT 20 UNITS SUBCUTANEOUSLY TWICE A DAY) 15 mL 1 12/10/2016 at Unknown time  . QUEtiapine (SEROQUEL) 25 MG tablet Take 25 mg by mouth 2 (two) times daily.   12/10/2016 at Unknown time  . sertraline (ZOLOFT) 50 MG tablet Take 50 mg by mouth daily.   12/10/2016 at Unknown time  . traMADol (ULTRAM) 50 MG tablet Take 1 tablet (50 mg total) by mouth every 6 (six) hours as needed. 20 tablet 0 Past Month at Unknown time  . triamcinolone cream (KENALOG) 0.1 % Apply 1 application topically 2 (two) times daily.   12/10/2016 at Unknown time  . hydrOXYzine (ATARAX/VISTARIL) 25 MG tablet TAKE 2 TABLETS (50 MG TOTAL) BY MOUTH EVERY 8 (EIGHT) HOURS AS NEEDED FOR ITCHING. (Patient not taking: Reported on 12/11/2016) 180 tablet 0 Not Taking at Unknown  time   Scheduled: . heparin  5,000 Units Subcutaneous Q8H  . QUEtiapine  25 mg Oral BID  . sertraline  50 mg Oral Daily    Results for orders placed or performed during the hospital encounter of 12/11/16 (from the past 48 hour(s))  Protime-INR     Status: None   Collection Time: 12/11/16  7:40 AM  Result Value Ref Range   Prothrombin Time 13.3 11.4 - 15.2 seconds   INR 1.01   APTT     Status: Abnormal   Collection Time: 12/11/16  7:40 AM  Result Value Ref Range   aPTT 40 (H) 24 - 36 seconds    Comment:        IF BASELINE aPTT IS ELEVATED, SUGGEST PATIENT RISK ASSESSMENT BE USED TO DETERMINE APPROPRIATE ANTICOAGULANT THERAPY.   CBC WITH  DIFFERENTIAL     Status: Abnormal   Collection Time: 12/11/16  7:40 AM  Result Value Ref Range   WBC 11.7 (H) 4.0 - 10.5 K/uL   RBC 4.42 3.87 - 5.11 MIL/uL   Hemoglobin 12.8 12.0 - 15.0 g/dL   HCT 38.4 36.0 - 46.0 %   MCV 86.9 78.0 - 100.0 fL   MCH 29.0 26.0 - 34.0 pg   MCHC 33.3 30.0 - 36.0 g/dL   RDW 14.8 11.5 - 15.5 %   Platelets 221 150 - 400 K/uL   Neutrophils Relative % 79 %   Neutro Abs 9.2 (H) 1.7 - 7.7 K/uL   Lymphocytes Relative 12 %   Lymphs Abs 1.4 0.7 - 4.0 K/uL   Monocytes Relative 8 %   Monocytes Absolute 0.9 0.1 - 1.0 K/uL   Eosinophils Relative 1 %   Eosinophils Absolute 0.2 0.0 - 0.7 K/uL   Basophils Relative 0 %   Basophils Absolute 0.0 0.0 - 0.1 K/uL  Lipase, blood     Status: None   Collection Time: 12/11/16  7:40 AM  Result Value Ref Range   Lipase 15 11 - 51 U/L  Hepatic function panel     Status: Abnormal   Collection Time: 12/11/16  7:40 AM  Result Value Ref Range   Total Protein 8.0 6.5 - 8.1 g/dL   Albumin 3.4 (L) 3.5 - 5.0 g/dL   AST 21 15 - 41 U/L   ALT 20 14 - 54 U/L   Alkaline Phosphatase 94 38 - 126 U/L   Total Bilirubin 0.6 0.3 - 1.2 mg/dL   Bilirubin, Direct <0.1 (L) 0.1 - 0.5 mg/dL   Indirect Bilirubin NOT CALCULATED 0.3 - 0.9 mg/dL  I-Stat Chem 8 ED  (not at [facility] [place])     Status: Abnormal   Collection Time: 12/11/16  7:49 AM  Result Value Ref Range   Sodium 137 135 - 145 mmol/L   Potassium 4.0 3.5 - 5.1 mmol/L   Chloride 100 (L) 101 - 111 mmol/L   BUN 18 6 - 20 mg/dL   Creatinine, Ser 1.30 (H) 0.44 - 1.00 mg/dL   Glucose, Bld 150 (H) 65 - 99 mg/dL   Calcium, Ion 1.11 (L) 1.15 - 1.40 mmol/L   TCO2 27 0 - 100 mmol/L   Hemoglobin 12.9 12.0 - 15.0 g/dL   HCT 38.0 36.0 - 46.0 %  I-Stat CG4 Lactic Acid, ED  (not at [facility])     Status: None   Collection Time: 12/11/16  7:49 AM  Result Value Ref Range   Lactic Acid, Venous 1.06 0.5 - 1.9 mmol/L  Urinalysis, Routine w reflex microscopic  Status: Abnormal   Collection Time: 12/11/16  10:10 AM  Result Value Ref Range   Color, Urine STRAW (A) YELLOW   APPearance CLEAR CLEAR   Specific Gravity, Urine 1.016 1.005 - 1.030   pH 7.0 5.0 - 8.0   Glucose, UA NEGATIVE NEGATIVE mg/dL   Hgb urine dipstick NEGATIVE NEGATIVE   Bilirubin Urine NEGATIVE NEGATIVE   Ketones, ur NEGATIVE NEGATIVE mg/dL   Protein, ur NEGATIVE NEGATIVE mg/dL   Nitrite NEGATIVE NEGATIVE   Leukocytes, UA NEGATIVE NEGATIVE  Comprehensive metabolic panel     Status: Abnormal   Collection Time: 12/12/16  5:58 AM  Result Value Ref Range   Sodium 132 (L) 135 - 145 mmol/L   Potassium 3.7 3.5 - 5.1 mmol/L   Chloride 100 (L) 101 - 111 mmol/L   CO2 25 22 - 32 mmol/L   Glucose, Bld 147 (H) 65 - 99 mg/dL   BUN 13 6 - 20 mg/dL   Creatinine, Ser 1.03 (H) 0.44 - 1.00 mg/dL   Calcium 8.5 (L) 8.9 - 10.3 mg/dL   Total Protein 7.6 6.5 - 8.1 g/dL   Albumin 3.3 (L) 3.5 - 5.0 g/dL   AST 17 15 - 41 U/L   ALT 18 14 - 54 U/L   Alkaline Phosphatase 75 38 - 126 U/L   Total Bilirubin 1.0 0.3 - 1.2 mg/dL   GFR calc non Af Amer 46 (L) >60 mL/min   GFR calc Af Amer 54 (L) >60 mL/min    Comment: (NOTE) The eGFR has been calculated using the CKD EPI equation. This calculation has not been validated in all clinical situations. eGFR's persistently <60 mL/min signify possible Chronic Kidney Disease.    Anion gap 7 5 - 15  CBC with Differential/Platelet     Status: None   Collection Time: 12/12/16  5:58 AM  Result Value Ref Range   WBC 7.0 4.0 - 10.5 K/uL   RBC 4.31 3.87 - 5.11 MIL/uL   Hemoglobin 12.4 12.0 - 15.0 g/dL   HCT 37.5 36.0 - 46.0 %   MCV 87.0 78.0 - 100.0 fL   MCH 28.8 26.0 - 34.0 pg   MCHC 33.1 30.0 - 36.0 g/dL   RDW 14.7 11.5 - 15.5 %   Platelets 216 150 - 400 K/uL   Neutrophils Relative % 65 %   Neutro Abs 4.6 1.7 - 7.7 K/uL   Lymphocytes Relative 18 %   Lymphs Abs 1.3 0.7 - 4.0 K/uL   Monocytes Relative 14 %   Monocytes Absolute 1.0 0.1 - 1.0 K/uL   Eosinophils Relative 3 %   Eosinophils Absolute  0.2 0.0 - 0.7 K/uL   Basophils Relative 0 %   Basophils Absolute 0.0 0.0 - 0.1 K/uL    Ct Abdomen Pelvis W Contrast  Result Date: 12/11/2016 CLINICAL DATA:  83 year old female with a history of right-sided abdominal pain for 1 day. EXAM: CT ABDOMEN AND PELVIS WITH CONTRAST TECHNIQUE: Multidetector CT imaging of the abdomen and pelvis was performed using the standard protocol following bolus administration of intravenous contrast. CONTRAST:  50m ISOVUE-300 IOPAMIDOL (ISOVUE-300) INJECTION 61%, 114mISOVUE-300 IOPAMIDOL (ISOVUE-300) INJECTION 61% COMPARISON:  CT 06/29/2015, 12/24/2014 FINDINGS: Lower chest: Respiratory motion at the lung base. Nonspecific linear and nodular opacities, likely a combination of scarring/atelectasis. No pleural effusions. Bronchiectasis. Calcifications of the thoracic aorta. Hepatobiliary: Unremarkable appearance of liver. Single calcified focus of the gallbladder, compatible with cholelithiasis. No evidence of inflammatory changes. No intrahepatic or extrahepatic biliary ductal dilatation. Pancreas: Unremarkable  appearance of the pancreas. Spleen: Unremarkable spleen Adrenals/Urinary Tract: Unremarkable bilateral adrenal glands. Bilateral kidneys symmetric perfusion. No evidence of left or right-sided hydronephrosis. No nephrolithiasis. Unremarkable course of the bilateral ureters. Unremarkable appearance of the urinary bladder, partially distended. Stomach/Bowel: Small hiatal hernia. No abnormally distended stomach or small bowel. No transition point. Enteric contrast reaches distal small bowel. No focal inflammatory changes. Appendix is not visualized, however, no inflammatory changes are present adjacent to the cecum to indicate an appendicitis. Colonic diverticular disease with no associated inflammatory changes. No significant stool burden. Vascular/Lymphatic: Calcifications of abdominal aorta. No aneurysm. Bilateral iliac vessels patent. Proximal femoral vasculature appears  patent. Reproductive: Hysterectomy Other: Small fact containing umbilical hernia. Musculoskeletal: No displaced fracture. Surgical changes of left hip arthroplasty. Multilevel degenerative changes of the spine. No bony canal narrowing. Mild degenerative changes of the right hip. IMPRESSION: No acute finding to account for the patient's symptoms of abdominal pain. Cholelithiasis without evidence of acute cholecystitis. Diverticular disease without evidence of acute diverticulitis. Aortic atherosclerosis. Degenerative changes of lumbar spine. Electronically Signed   By: Corrie Mckusick D.O.   On: 12/11/2016 10:00   US Abdomen Limited Ruq  Result Date: 12/11/2016 CLINICAL DATA:  Abdominal pain for 1 day EXAM: US ABDOMEN LIMITED - RIGHT UPPER QUADRANT COMPARISON:  CT 12/11/2016 FINDINGS: Gallbladder: Small non mobile echogenic foci along the gallbladder wall, likely small polyps, 2.3 mm. Small stone, 5 mm. No wall thickening. Negative sonographic Murphy's. Common bile duct: Diameter: Normal caliber, 4 mm Liver: No focal lesion identified. Within normal limits in parenchymal echogenicity. IMPRESSION: Small gallstone without changes of acute cholecystitis. 1-2 small gallbladder wall polyps. No acute findings. Electronically Signed   By: Rolm Baptise M.D.   On: 12/11/2016 12:05    ROS:  Review of systems not obtained due to patient factors.  Blood pressure (!) 144/79, pulse 75, temperature 98 F (36.7 C), temperature source Oral, resp. rate 18, height '5\' 5"'  (1.651 m), weight 232 lb 3.2 oz (105.3 kg), SpO2 94 %. Physical Exam: Pleasant elderly female in no acute distress. Head is normocephalic, atraumatic. Lungs clear auscultation with breath sounds bilaterally. Neck is supple without JVD. Heart examination reveals a regular rate and rhythm without S3, S4, murmurs. Abdomen is soft, nontender, nondistended. No hepatosplenomegaly, masses, hernias, or rigidity are noted. Ultrasound and CT scan reports reviewed.  Discussed situation with family.  Assessment/Plan: Impression: Cholelithiasis, currently asymptomatic. It is difficult to certain whether she actually had an episode of biliary colic or some other issue. As her white blood cell count is normal and her liver enzyme tests are normal, I would not recommend cholecystectomy at this time. The family agreed. Will advance diet. May be discharged once tolerating by mouth well.  Aviva Signs 12/12/2016, 8:47 AM

## 2017-02-19 ENCOUNTER — Emergency Department (HOSPITAL_COMMUNITY): Payer: Medicare Other

## 2017-02-19 ENCOUNTER — Emergency Department (HOSPITAL_COMMUNITY)
Admission: EM | Admit: 2017-02-19 | Discharge: 2017-02-19 | Disposition: A | Discharge status: Home / Self Care | Payer: Medicare Other | Attending: Emergency Medicine | Admitting: Emergency Medicine

## 2017-02-19 ENCOUNTER — Encounter (HOSPITAL_COMMUNITY): Payer: Self-pay

## 2017-02-19 DIAGNOSIS — Y92122 Bedroom in nursing home as the place of occurrence of the external cause: Secondary | ICD-10-CM | POA: Insufficient documentation

## 2017-02-19 DIAGNOSIS — W19XXXA Unspecified fall, initial encounter: Secondary | ICD-10-CM

## 2017-02-19 DIAGNOSIS — S0083XA Contusion of other part of head, initial encounter: Secondary | ICD-10-CM | POA: Insufficient documentation

## 2017-02-19 DIAGNOSIS — Y9384 Activity, sleeping: Secondary | ICD-10-CM | POA: Insufficient documentation

## 2017-02-19 DIAGNOSIS — S8001XA Contusion of right knee, initial encounter: Secondary | ICD-10-CM | POA: Insufficient documentation

## 2017-02-19 DIAGNOSIS — Z79899 Other long term (current) drug therapy: Secondary | ICD-10-CM | POA: Insufficient documentation

## 2017-02-19 DIAGNOSIS — M25521 Pain in right elbow: Secondary | ICD-10-CM

## 2017-02-19 DIAGNOSIS — Y92129 Unspecified place in nursing home as the place of occurrence of the external cause: Secondary | ICD-10-CM

## 2017-02-19 DIAGNOSIS — J45909 Unspecified asthma, uncomplicated: Secondary | ICD-10-CM | POA: Insufficient documentation

## 2017-02-19 DIAGNOSIS — Y999 Unspecified external cause status: Secondary | ICD-10-CM | POA: Diagnosis not present

## 2017-02-19 DIAGNOSIS — M25552 Pain in left hip: Secondary | ICD-10-CM

## 2017-02-19 DIAGNOSIS — Z85820 Personal history of malignant melanoma of skin: Secondary | ICD-10-CM | POA: Diagnosis not present

## 2017-02-19 DIAGNOSIS — E119 Type 2 diabetes mellitus without complications: Secondary | ICD-10-CM | POA: Insufficient documentation

## 2017-02-19 DIAGNOSIS — S0990XA Unspecified injury of head, initial encounter: Secondary | ICD-10-CM | POA: Diagnosis present

## 2017-02-19 DIAGNOSIS — Z794 Long term (current) use of insulin: Secondary | ICD-10-CM | POA: Diagnosis not present

## 2017-02-19 DIAGNOSIS — R4182 Altered mental status, unspecified: Secondary | ICD-10-CM | POA: Insufficient documentation

## 2017-02-19 DIAGNOSIS — S0003XA Contusion of scalp, initial encounter: Secondary | ICD-10-CM | POA: Insufficient documentation

## 2017-02-19 DIAGNOSIS — I10 Essential (primary) hypertension: Secondary | ICD-10-CM | POA: Insufficient documentation

## 2017-02-19 DIAGNOSIS — W06XXXA Fall from bed, initial encounter: Secondary | ICD-10-CM | POA: Diagnosis not present

## 2017-02-19 NOTE — ED Notes (Signed)
Patient transported to CT 

## 2017-02-19 NOTE — Discharge Instructions (Signed)
Your x-rays and CT scans tonight looked good, there are no fractures. Use ice packs for the bruising and swollen areas. It will also help with discomfort. He can take acetaminophen as needed for pain. Return to the emergency department for any problems listed on the head injury sheet.

## 2017-02-19 NOTE — ED Notes (Signed)
Report given to [facility] at [facility].

## 2017-02-19 NOTE — ED Provider Notes (Signed)
[facility] DEPT Provider Note   CSN: [id number] Arrival date & time: 02/19/17  0258   Time Seen 04:46  History   Chief Complaint Chief Complaint  Patient presents with  . Fall   Level V caveat due to age  HPI Sheena [name] is a 83 y.o. female.  HPI  patient states she was asleep in her bed at her nursing facility when she fell out of bed. They told the family they thought she hit her head on the bedside cabinet. She complains of a right-sided headache. She also complains of pain in her right knee, left hip, and she has tenderness in her right face and her right elbow. Family states she is her usual self.  PCP Lorene Dy, MD   Past Medical History:  Diagnosis Date  . Asthma    treated by Dr. Donneta Romberg  . Depression   . Diabetes mellitus, type 2 ([place])   . H/O: hysterectomy 1973   fibroids  . Hypertension   . Left arm pain   . Lumbago   . Melanoma ([place])    left arm, resected, annual CXR's ordered  . Syncope 08/2013   Suspected vagal-mediated    Patient Active Problem List   Diagnosis Date Noted  . Cholelithiasis 12/11/2016  . Abdominal pain 12/11/2016  . Back pain, thoracic 10/14/2013  . Mild cognitive impairment with memory loss 10/04/2013  . Syncope and collapse 08/26/2013  . Other and unspecified hyperlipidemia 05/14/2013  . Unspecified constipation 05/14/2013  . Osteoarthritis of left hip 01/11/2013  . History of syncope 11/15/2012  . Ankle fracture, left 11/15/2012  . H/O acute renal failure 11/15/2012  . Proximal muscle weakness 10/30/2012  . Neurotic excoriations 09/02/2012  . Loss of balance 06/06/2012  . Melanoma ([place])   . Asthma   . Insomnia, persistent 09/01/2011  . Severe major depression with psychotic features ([place]) 09/01/2011  . Obesity (BMI 30-39.9) 09/01/2011  . Obesity, diabetes, and hypertension syndrome ([place]) 09/01/2011    Past Surgical History:  Procedure Laterality Date  . ABDOMINAL HYSTERECTOMY  1973   fibroid uterus  .  BREAST BIOPSY  1985   normal  . JOINT REPLACEMENT   2003   Left Hip  . TOTAL HIP ARTHROPLASTY  2003   left, Dr. Durward Fortes  . TRANSTHORACIC ECHOCARDIOGRAM  08/23/13   EF 60-65%, borderline concentric LVH, impaired LV diastolic relaxation, LA ULN    OB History    No data available       Home Medications    Prior to Admission medications   Medication Sig Start Date End Date Taking? Authorizing Provider  ALPRAZolam [name]) 1 MG tablet Take 1 mg by mouth 3 (three) times daily as needed for anxiety or sleep.     [provider]  atorvastatin (LIPITOR) 40 MG tablet TAKE ONE TABLET BY MOUTH EVERY DAY. 06/12/14   [name], MD  hydrOXYzine (VISTARIL) 50 MG capsule Take 50 mg by mouth 3 (three) times daily as needed for itching.    [provider]  LANTUS SOLOSTAR 100 UNIT/ML Solostar Pen INJECT 16 UNITS SUBCUTANEOUSLY AT BEDTIME. Patient taking differently: INJECT 20 UNITS SUBCUTANEOUSLY TWICE A DAY 04/11/14   [name], MD  QUEtiapine (SEROQUEL) 25 MG tablet Take 25 mg by mouth 2 (two) times daily.    [provider]  sertraline (ZOLOFT) 50 MG tablet Take 50 mg by mouth daily.    [provider]  traMADol (ULTRAM) 50 MG tablet Take 1 tablet (50 mg  total) by mouth every 6 (six) hours as needed. 06/29/15   [name], MD  triamcinolone cream (KENALOG) 0.1 % Apply 1 application topically 2 (two) times daily.    [provider]    Family History Family History  Problem Relation Age of Onset  . Diabetes Mother   . Heart disease Mother        CHF  . Heart disease Father   . Diabetes Father     Social History Social History  Substance Use Topics  . Smoking status: Never Smoker  . Smokeless tobacco: Never Used  . Alcohol use No     Comment: occasional  lives in NH Uses a walker   Allergies   Patient has no known allergies.   Review of Systems Review of Systems  Unable to perform ROS: Age     Physical  Exam Updated Vital Signs BP (!) 154/76 (BP Location: Left Arm)   Pulse 72   Temp 98.1 F (36.7 C) (Oral)   Resp 18   Ht 5\' 5"  (1.651 m)   Wt 105.2 kg (232 lb)   SpO2 97%   BMI 38.61 kg/m   Vital signs normal except for hypertension   Physical Exam  Constitutional: She is oriented to person, place, and time. She appears well-developed and well-nourished.  Non-toxic appearance. She does not appear ill. No distress.  HENT:  Head: Normocephalic.  Right Ear: External ear normal.  Left Ear: External ear normal.  Nose: Nose normal. No mucosal edema or rhinorrhea.  Mouth/Throat: Oropharynx is clear and moist and mucous membranes are normal. No dental abscesses or uvula swelling.  Patient has bruising and swelling in her right scalp near the forehead that is tender to palpation. She also has some bruising over the right facial cheek with some tenderness to palpation. She does not have pain on range of motion of her jaw.  Eyes: Pupils are equal, round, and reactive to light. Conjunctivae and EOM are normal.  Neck: Normal range of motion and full passive range of motion without pain. Neck supple.  Patient has minor discomfort to palpation of her cervical spine.  Cardiovascular: Normal rate, regular rhythm and normal heart sounds.  Exam reveals no gallop and no friction rub.   No murmur heard. Pulmonary/Chest: Effort normal and breath sounds normal. No respiratory distress. She has no wheezes. She has no rhonchi. She has no rales. She exhibits no tenderness and no crepitus.  Abdominal: Soft. Normal appearance and bowel sounds are normal. She exhibits no distension. There is no tenderness. There is no rebound and no guarding.  Musculoskeletal: Normal range of motion. She exhibits tenderness. She exhibits no edema.  Moves all extremities well. When patient flexes her right knee she states it has pain. There is faint bruising and small amount of swelling to the right knee. When she flexes her left  knee she complains of discomfort in her left hip, however she also commonly complains of left hip pain status post prior surgery there. I do not see any obvious bruising on her upper extremities except for a small round area on the dorsum of her right hand about the size of a quarter. When she flexes her upper arm she states she has some just comfort in her right elbow. There is no joint effusion appreciated or bruising of the elbow.  Neurological: She is alert and oriented to person, place, and time. She has normal strength. No cranial nerve deficit.  Skin: Skin is warm, dry  and intact. No rash noted. No erythema. No pallor.  Psychiatric: She has a normal mood and affect. Her speech is normal and behavior is normal. Her mood appears not anxious.  Nursing note and vitals reviewed.    ED Treatments / Results  Labs (all labs ordered are listed, but only abnormal results are displayed) Labs Reviewed - No data to display  EKG  EKG Interpretation None       Radiology Dg Elbow Complete Right  Result Date: 02/19/2017 CLINICAL DATA:  Fall out of bed at nursing home. Right arm abrasion. Initial encounter. EXAM: RIGHT ELBOW - COMPLETE 3+ VIEW COMPARISON:  None. FINDINGS: Limited lateral view, but the ventral fat pad is visible and not bowed. Negative for fracture or malalignment. Osteopenia. No opaque foreign body. IMPRESSION: No acute finding. Electronically Signed   By: Monte Fantasia M.D.   On: 02/19/2017 07:05    Ct Head Wo Contrast Ct Maxillofacial Wo Cm Ct Cervical Spine Wo Contrast   Result Date: 02/19/2017 CLINICAL DATA:  Initial evaluation for acute trauma, fall. EXAM: CT HEAD WITHOUT CONTRAST CT MAXILLOFACIAL WITHOUT CONTRAST CT CERVICAL SPINE WITHOUT CONTRAST TECHNIQUE: Multidetector CT imaging of the head, cervical spine, and maxillofacial structures were performed using the standard protocol without intravenous contrast. Multiplanar CT image reconstructions of the cervical spine  and maxillofacial structures were also generated. COMPARISON:  Prior CT from 08/12/2016. FINDINGS: CT HEAD FINDINGS Brain: Age related cerebral volume loss with chronic small vessel ischemic disease. No acute intracranial hemorrhage. No evidence for acute large vessel territory infarct. No mass lesion, midline shift or mass effect. No hydrocephalus. No extra-axial fluid collection. Vascular: No hyperdense vessel. Scattered vascular calcifications noted within the carotid siphons. Skull: Soft tissue contusion present at the right frontal scalp. Calvarium intact. Other: No mastoid effusion. CT MAXILLOFACIAL FINDINGS Osseous: Zygomatic arches intact. No acute maxillary fracture. Pterygoid plates intact. Nasal bones intact. Nasal septum intact. No acute mandibular fracture. No acute abnormality about the dentition. Mandibular condyles normally situated. Orbits: Globes and orbital soft tissues within normal limits. Patient status post lens extraction bilaterally. Bony orbits intact. Sinuses: Paranasal sinuses are clear. Soft tissues: Small right facial contusion. CT CERVICAL SPINE FINDINGS Alignment: Straightening with mild reversal of normal cervical lordosis. No listhesis. Skull base and vertebrae: Skullbase intact. Normal C1-2 articulations are preserved. Dens is intact. Vertebral body heights are preserved. No acute fracture. Soft tissues and spinal canal: Visualized soft tissues of the neck within normal limits. Vascular calcifications noted about the carotid bifurcations. No prevertebral edema. Disc levels: Advanced degenerative spondylolysis noted at C5-6 and C6-7. Multilevel facet arthrosis. Upper chest: Visualized upper chest demonstrates no acute abnormality. Partially visualized lungs are grossly clear. IMPRESSION: 1. No acute intracranial process identified. Age-related cerebral atrophy with chronic small vessel ischemic disease. 2. Small soft tissue contusions at the right frontal scalp and right face. 3. No  other acute maxillofacial injury.  No fracture. 4. No acute traumatic injury within the cervical spine. 5. Advanced degenerative spondylolysis at C4-5 through C6-7. Electronically Signed   By: Jeannine Boga M.D.   On: 02/19/2017 06:41   Dg Knee Complete 4 Views Right  Result Date: 02/19/2017 CLINICAL DATA:  83 year old female with fall and trauma to the right knee. EXAM: RIGHT KNEE - COMPLETE 4+ VIEW COMPARISON:  MRI dated 04/10/2006 FINDINGS: There is no acute fracture or dislocation. There is mild osteoarthritic changes of the knee with narrowing of the medial compartment. The bones are osteopenic. No significant effusion. The soft tissues appear  unremarkable. IMPRESSION: No acute fracture or dislocation. Electronically Signed   By: Anner Crete M.D.   On: 02/19/2017 06:32   Dg Hip Unilat W Or Wo Pelvis 2-3 Views Left  Result Date: 02/19/2017 CLINICAL DATA:  83 year old female with fall and left hip pain. EXAM: DG HIP (WITH OR WITHOUT PELVIS) 2-3V LEFT COMPARISON:  None. FINDINGS: There is no acute fracture or dislocation. There is a total left hip arthroplasty. The arthroplasty components are intact and in anatomic alignment. No evidence of loosening. The bones are osteopenic. There is degenerative changes of the lower lumbar spine. The soft tissues appear unremarkable. IMPRESSION: Negative. Electronically Signed   By: Anner Crete M.D.   On: 02/19/2017 06:44      Procedures Procedures (including critical care time)  Medications Ordered in ED Medications - No data to display   Initial Impression / Assessment and Plan / ED Course  I have reviewed the triage vital signs and the nursing notes.  Pertinent labs & imaging results that were available during my care of the patient were reviewed by me and considered in my medical decision making (see chart for details).  X-rays and CT scans  were obtained of the areas that are painful.  Patient and her family were given the  results of her x-rays and CT scans. They are relieved to know there are no fractures. Patient was discharged back to her facility.  Final Clinical Impressions(s) / ED Diagnoses   Final diagnoses:  Fall at nursing home, initial encounter  Contusion of scalp, initial encounter  Contusion of face, initial encounter  Contusion of right knee, initial encounter  Right elbow pain  Left hip pain    New Prescriptions OTC acetaminophen  Plan discharge  Rolland Porter, MD, Barbette Or, MD 02/19/17 [phone]

## 2017-02-19 NOTE — ED Notes (Signed)
Pt returned from radiology.

## 2017-02-19 NOTE — ED Notes (Signed)
MD at bedside. 

## 2017-02-19 NOTE — ED Triage Notes (Signed)
Pt is from [facility] where she reportedly slipped out of bed onto the floor, unwitnessed.  Pt states she hit her head and has an abrasion to her right arm.

## 2017-02-19 NOTE — ED Notes (Signed)
Patient transported to X-ray 

## 2018-05-07 ENCOUNTER — Other Ambulatory Visit: Payer: Self-pay

## 2018-05-07 ENCOUNTER — Encounter (HOSPITAL_COMMUNITY): Payer: Self-pay | Admitting: Radiology

## 2018-05-07 ENCOUNTER — Inpatient Hospital Stay (HOSPITAL_COMMUNITY)
Admission: EM | Admit: 2018-05-07 | Discharge: 2018-05-09 | DRG: 190 | Disposition: A | Discharge status: Nursing Home (Includes ICF) | Payer: Medicare Other | Attending: Internal Medicine | Admitting: Internal Medicine

## 2018-05-07 ENCOUNTER — Emergency Department (HOSPITAL_COMMUNITY): Payer: Medicare Other

## 2018-05-07 DIAGNOSIS — G3184 Mild cognitive impairment, so stated: Secondary | ICD-10-CM | POA: Diagnosis present

## 2018-05-07 DIAGNOSIS — Z6837 Body mass index (BMI) 37.0-37.9, adult: Secondary | ICD-10-CM

## 2018-05-07 DIAGNOSIS — Z96642 Presence of left artificial hip joint: Secondary | ICD-10-CM | POA: Diagnosis present

## 2018-05-07 DIAGNOSIS — E669 Obesity, unspecified: Secondary | ICD-10-CM | POA: Diagnosis present

## 2018-05-07 DIAGNOSIS — F329 Major depressive disorder, single episode, unspecified: Secondary | ICD-10-CM | POA: Diagnosis present

## 2018-05-07 DIAGNOSIS — Z794 Long term (current) use of insulin: Secondary | ICD-10-CM

## 2018-05-07 DIAGNOSIS — J441 Chronic obstructive pulmonary disease with (acute) exacerbation: Principal | ICD-10-CM | POA: Diagnosis present

## 2018-05-07 DIAGNOSIS — J40 Bronchitis, not specified as acute or chronic: Secondary | ICD-10-CM | POA: Diagnosis not present

## 2018-05-07 DIAGNOSIS — Z7984 Long term (current) use of oral hypoglycemic drugs: Secondary | ICD-10-CM

## 2018-05-07 DIAGNOSIS — I10 Essential (primary) hypertension: Secondary | ICD-10-CM | POA: Diagnosis not present

## 2018-05-07 DIAGNOSIS — Z85820 Personal history of malignant melanoma of skin: Secondary | ICD-10-CM | POA: Diagnosis not present

## 2018-05-07 DIAGNOSIS — J9601 Acute respiratory failure with hypoxia: Secondary | ICD-10-CM | POA: Diagnosis present

## 2018-05-07 DIAGNOSIS — F0390 Unspecified dementia without behavioral disturbance: Secondary | ICD-10-CM | POA: Diagnosis present

## 2018-05-07 DIAGNOSIS — Z79899 Other long term (current) drug therapy: Secondary | ICD-10-CM | POA: Diagnosis not present

## 2018-05-07 DIAGNOSIS — T380X5A Adverse effect of glucocorticoids and synthetic analogues, initial encounter: Secondary | ICD-10-CM | POA: Diagnosis not present

## 2018-05-07 DIAGNOSIS — E785 Hyperlipidemia, unspecified: Secondary | ICD-10-CM | POA: Diagnosis present

## 2018-05-07 DIAGNOSIS — E119 Type 2 diabetes mellitus without complications: Secondary | ICD-10-CM

## 2018-05-07 DIAGNOSIS — E1165 Type 2 diabetes mellitus with hyperglycemia: Secondary | ICD-10-CM | POA: Diagnosis not present

## 2018-05-07 DIAGNOSIS — J9621 Acute and chronic respiratory failure with hypoxia: Secondary | ICD-10-CM | POA: Diagnosis present

## 2018-05-07 DIAGNOSIS — R0902 Hypoxemia: Secondary | ICD-10-CM

## 2018-05-07 LAB — COMPREHENSIVE METABOLIC PANEL
ALK PHOS: 111 U/L (ref 38–126)
ALT: 31 U/L (ref 0–44)
ANION GAP: 8 (ref 5–15)
AST: 36 U/L (ref 15–41)
Albumin: 3.4 g/dL — ABNORMAL LOW (ref 3.5–5.0)
BUN: 23 mg/dL (ref 8–23)
CALCIUM: 8.2 mg/dL — AB (ref 8.9–10.3)
CO2: 25 mmol/L (ref 22–32)
Chloride: 99 mmol/L (ref 98–111)
Creatinine, Ser: 1.3 mg/dL — ABNORMAL HIGH (ref 0.44–1.00)
GFR calc non Af Amer: 35 mL/min — ABNORMAL LOW (ref >60)
GFR, EST AFRICAN AMERICAN: 40 mL/min — AB (ref >60)
Glucose, Bld: 209 mg/dL — ABNORMAL HIGH (ref 70–99)
Potassium: 3.7 mmol/L (ref 3.5–5.1)
SODIUM: 132 mmol/L — AB (ref 135–145)
Total Bilirubin: 0.7 mg/dL (ref 0.3–1.2)
Total Protein: 8.6 g/dL — ABNORMAL HIGH (ref 6.5–8.1)

## 2018-05-07 LAB — CBC WITH DIFFERENTIAL/PLATELET
Basophils Absolute: 0 10*3/uL (ref 0.0–0.1)
Basophils Relative: 0 %
EOS ABS: 0.3 10*3/uL (ref 0.0–0.7)
Eosinophils Relative: 4 %
HCT: 38.8 % (ref 36.0–46.0)
HEMOGLOBIN: 12.8 g/dL (ref 12.0–15.0)
LYMPHS ABS: 1.6 10*3/uL (ref 0.7–4.0)
Lymphocytes Relative: 21 %
MCH: 30.5 pg (ref 26.0–34.0)
MCHC: 33 g/dL (ref 30.0–36.0)
MCV: 92.4 fL (ref 78.0–100.0)
MONO ABS: 0.8 10*3/uL (ref 0.1–1.0)
MONOS PCT: 10 %
Neutro Abs: 5 10*3/uL (ref 1.7–7.7)
Neutrophils Relative %: 65 %
Platelets: 228 10*3/uL (ref 150–400)
RBC: 4.2 MIL/uL (ref 3.87–5.11)
RDW: 14.6 % (ref 11.5–15.5)
WBC: 7.7 10*3/uL (ref 4.0–10.5)

## 2018-05-07 LAB — GLUCOSE, CAPILLARY: GLUCOSE-CAPILLARY: 357 mg/dL — AB (ref 70–99)

## 2018-05-07 MED ORDER — ATORVASTATIN CALCIUM 40 MG PO TABS
40.0000 mg | ORAL_TABLET | Freq: Every evening | ORAL | Status: DC
  Administered 2018-05-07 – 2018-05-08 (×2): 40 mg via ORAL
  Filled 2018-05-07 (×2): qty 1

## 2018-05-07 MED ORDER — INSULIN GLARGINE 100 UNIT/ML ~~LOC~~ SOLN
20.0000 [IU] | Freq: Two times a day (BID) | SUBCUTANEOUS | Status: DC
  Administered 2018-05-07 – 2018-05-08 (×3): 20 [IU] via SUBCUTANEOUS
  Filled 2018-05-07 (×6): qty 0.2

## 2018-05-07 MED ORDER — IPRATROPIUM-ALBUTEROL 0.5-2.5 (3) MG/3ML IN SOLN
3.0000 mL | Freq: Four times a day (QID) | RESPIRATORY_TRACT | Status: DC
  Administered 2018-05-07 – 2018-05-08 (×4): 3 mL via RESPIRATORY_TRACT
  Filled 2018-05-07 (×3): qty 3

## 2018-05-07 MED ORDER — ENOXAPARIN SODIUM 60 MG/0.6ML ~~LOC~~ SOLN
50.0000 mg | SUBCUTANEOUS | Status: DC
  Administered 2018-05-07 – 2018-05-08 (×2): 50 mg via SUBCUTANEOUS
  Filled 2018-05-07 (×2): qty 0.6

## 2018-05-07 MED ORDER — ALBUTEROL SULFATE (2.5 MG/3ML) 0.083% IN NEBU: 2.5000 mg | INHALATION_SOLUTION | RESPIRATORY_TRACT | Status: DC | PRN

## 2018-05-07 MED ORDER — INSULIN ASPART 100 UNIT/ML ~~LOC~~ SOLN
0.0000 [IU] | Freq: Three times a day (TID) | SUBCUTANEOUS | Status: DC
  Administered 2018-05-08 (×2): 11 [IU] via SUBCUTANEOUS
  Administered 2018-05-08: 15 [IU] via SUBCUTANEOUS
  Administered 2018-05-09: 11 [IU] via SUBCUTANEOUS
  Administered 2018-05-09: 7 [IU] via SUBCUTANEOUS

## 2018-05-07 MED ORDER — PREDNISONE 50 MG PO TABS
60.0000 mg | ORAL_TABLET | Freq: Once | ORAL | Status: AC
  Administered 2018-05-07: 60 mg via ORAL
  Filled 2018-05-07: qty 1

## 2018-05-07 MED ORDER — SERTRALINE HCL 50 MG PO TABS
50.0000 mg | ORAL_TABLET | Freq: Every day | ORAL | Status: DC
  Administered 2018-05-08 – 2018-05-09 (×2): 50 mg via ORAL
  Filled 2018-05-07 (×2): qty 1

## 2018-05-07 MED ORDER — ALPRAZOLAM 1 MG PO TABS
1.0000 mg | ORAL_TABLET | Freq: Every day | ORAL | Status: DC
  Administered 2018-05-07 – 2018-05-09 (×3): 1 mg via ORAL
  Filled 2018-05-07 (×3): qty 1

## 2018-05-07 MED ORDER — PREDNISONE 20 MG PO TABS
40.0000 mg | ORAL_TABLET | Freq: Every day | ORAL | Status: DC
  Administered 2018-05-08 – 2018-05-09 (×2): 40 mg via ORAL
  Filled 2018-05-07 (×2): qty 2

## 2018-05-07 MED ORDER — DOXYCYCLINE HYCLATE 100 MG PO TABS
100.0000 mg | ORAL_TABLET | Freq: Once | ORAL | Status: AC
  Administered 2018-05-07: 100 mg via ORAL
  Filled 2018-05-07: qty 1

## 2018-05-07 MED ORDER — INSULIN ASPART 100 UNIT/ML ~~LOC~~ SOLN
0.0000 [IU] | Freq: Every day | SUBCUTANEOUS | Status: DC
  Administered 2018-05-07: 5 [IU] via SUBCUTANEOUS
  Administered 2018-05-08: 6 [IU] via SUBCUTANEOUS

## 2018-05-07 MED ORDER — MOMETASONE FURO-FORMOTEROL FUM 200-5 MCG/ACT IN AERO
1.0000 | INHALATION_SPRAY | Freq: Two times a day (BID) | RESPIRATORY_TRACT | Status: DC
  Administered 2018-05-08 – 2018-05-09 (×3): 1 via RESPIRATORY_TRACT
  Filled 2018-05-07: qty 8.8

## 2018-05-07 MED ORDER — TRAMADOL HCL 50 MG PO TABS: 50.0000 mg | ORAL_TABLET | Freq: Four times a day (QID) | ORAL | Status: DC | PRN

## 2018-05-07 MED ORDER — DOXYCYCLINE HYCLATE 100 MG PO TABS
100.0000 mg | ORAL_TABLET | Freq: Two times a day (BID) | ORAL | Status: DC
  Administered 2018-05-07 – 2018-05-09 (×4): 100 mg via ORAL
  Filled 2018-05-07 (×4): qty 1

## 2018-05-07 MED ORDER — QUETIAPINE FUMARATE 25 MG PO TABS
25.0000 mg | ORAL_TABLET | Freq: Two times a day (BID) | ORAL | Status: DC
  Administered 2018-05-07 – 2018-05-09 (×4): 25 mg via ORAL
  Filled 2018-05-07 (×4): qty 1

## 2018-05-07 MED ORDER — IPRATROPIUM-ALBUTEROL 0.5-2.5 (3) MG/3ML IN SOLN
3.0000 mL | Freq: Once | RESPIRATORY_TRACT | Status: AC
  Administered 2018-05-07: 3 mL via RESPIRATORY_TRACT
  Filled 2018-05-07: qty 3

## 2018-05-07 NOTE — ED Provider Notes (Signed)
[facility] EMERGENCY DEPARTMENT Provider Note   CSN: [id number] Arrival date & time: 05/07/18  1302     History   Chief Complaint No chief complaint on file.   HPI Sheena [name] is a 84 y.o. female.  HPI  She presents for evaluation of cough productive of yellow sputum for several days.  She denies hemoptysis, fever, chills, chest pain, weakness or dizziness.  Does not use oxygen at home.  Apparently she lives in an assisted living facility where several other people are ill with similar illnesses which prompted her son to request that she be evaluated here today.  She denies other recent illnesses or problems.  She is taking her usual medications.  She does not use oxygen regularly.  There are no other known modifying factors.   Past Medical History:  Diagnosis Date  . Asthma    treated by Dr. Donneta Romberg  . Depression   . Diabetes mellitus, type 2 (Sheena [name])   . H/O: hysterectomy 1973   fibroids  . Hypertension   . Left arm pain   . Lumbago   . Melanoma (Sheena [name])    left arm, resected, annual CXR's ordered  . Syncope 08/2013   Suspected vagal-mediated    Patient Active Problem List   Diagnosis Date Noted  . Cholelithiasis 12/11/2016  . Abdominal pain 12/11/2016  . Back pain, thoracic 10/14/2013  . Mild cognitive impairment with memory loss 10/04/2013  . Syncope and collapse 08/26/2013  . Other and unspecified hyperlipidemia 05/14/2013  . Unspecified constipation 05/14/2013  . Osteoarthritis of left hip 01/11/2013  . History of syncope 11/15/2012  . Ankle fracture, left 11/15/2012  . H/O acute renal failure 11/15/2012  . Proximal muscle weakness 10/30/2012  . Neurotic excoriations 09/02/2012  . Loss of balance 06/06/2012  . Melanoma (Sheena [name])   . Asthma   . Insomnia, persistent 09/01/2011  . Severe major depression with psychotic features ([name]) 09/01/2011  . Obesity (BMI 30-39.9) 09/01/2011  . Obesity, diabetes, and hypertension syndrome ([name]) 09/01/2011    Past  Surgical History:  Procedure Laterality Date  . ABDOMINAL HYSTERECTOMY  1973   fibroid uterus  . BREAST BIOPSY  1985   normal  . JOINT REPLACEMENT   2003   Left Hip  . TOTAL HIP ARTHROPLASTY  2003   left, Dr. Durward Fortes  . TRANSTHORACIC ECHOCARDIOGRAM  08/23/13   EF 60-65%, borderline concentric LVH, impaired LV diastolic relaxation, LA ULN     OB History   None      Home Medications    Prior to Admission medications   Medication Sig Start Date End Date Taking? Authorizing Provider  ALPRAZolam [name]) 1 MG tablet Take 1 mg by mouth daily.    Yes [provider]  atorvastatin (LIPITOR) 40 MG tablet TAKE ONE TABLET BY MOUTH EVERY DAY. Patient taking differently: Take 40 mg by mouth every evening.  06/12/14  Yes [name], MD  LANTUS SOLOSTAR 100 UNIT/ML Solostar Pen INJECT 16 UNITS SUBCUTANEOUSLY AT BEDTIME. Patient taking differently: Inject 20 Units into the skin 2 (two) times daily.  04/11/14  Yes [name], MD  QUEtiapine (SEROQUEL) 25 MG tablet Take 25 mg by mouth 2 (two) times daily.   Yes [provider]  sertraline (ZOLOFT) 50 MG tablet Take 50 mg by mouth daily.   Yes [provider]  traMADol (ULTRAM) 50 MG tablet Take 1 tablet (50 mg total) by mouth every 6 (six) hours as needed. 06/29/15  Yes [name],  MD  triamcinolone cream (KENALOG) 0.1 % Apply 1 application topically 2 (two) times daily.   Yes [provider]    Family History Family History  Problem Relation Age of Onset  . Diabetes Mother   . Heart disease Mother        CHF  . Heart disease Father   . Diabetes Father     Social History Social History   Tobacco Use  . Smoking status: Never Smoker  . Smokeless tobacco: Never Used  Substance Use Topics  . Alcohol use: No    Comment: occasional  . Drug use: No     Allergies   Patient has no known allergies.   Review of Systems Review of Systems  All other systems reviewed and are  negative.    Physical Exam Updated Vital Signs BP (!) 136/96   Pulse 70   Temp 98.4 F (36.9 C) (Temporal)   Resp 20   Ht 5\' 6"  (1.676 m)   Wt 105.2 kg   SpO2 94%   BMI 37.45 kg/m   Physical Exam  Constitutional: She is oriented to person, place, and time. She appears well-developed. No distress.  Elderly, frail  HENT:  Head: Normocephalic and atraumatic.  Eyes: Pupils are equal, round, and reactive to light. Conjunctivae and EOM are normal.  Neck: Normal range of motion and phonation normal. Neck supple.  Cardiovascular: Normal rate and regular rhythm.  Pulmonary/Chest: Effort normal and breath sounds normal. No stridor. No respiratory distress. She has no wheezes. She has no rales. She exhibits no tenderness.  Few scattered rhonchi  Abdominal: Soft. She exhibits no distension. There is no tenderness. There is no guarding.  Musculoskeletal: Normal range of motion.  Neurological: She is alert and oriented to person, place, and time. She exhibits normal muscle tone.  Skin: Skin is warm and dry.  Psychiatric: She has a normal mood and affect. Her behavior is normal.  Nursing note and vitals reviewed.    ED Treatments / Results  Labs (all labs ordered are listed, but only abnormal results are displayed) Labs Reviewed  COMPREHENSIVE METABOLIC PANEL - Abnormal; Notable for the following components:      Result Value   Sodium 132 (*)    Glucose, Bld 209 (*)    Creatinine, Ser 1.30 (*)    Calcium 8.2 (*)    Total Protein 8.6 (*)    Albumin 3.4 (*)    GFR calc non Af Amer 35 (*)    GFR calc Af Amer 40 (*)    All other components within normal limits  CBC WITH DIFFERENTIAL/PLATELET    EKG None  Radiology Dg Chest 2 View  Result Date: 05/07/2018 CLINICAL DATA:  Cough. History of asthma, diabetes, hypertension and malignancy. EXAM: CHEST - 2 VIEW COMPARISON:  04/30/2016 FINDINGS: Grossly unchanged enlarged cardiac silhouette and mediastinal contours given reduced lung  volumes and patient rotation. There is minimal deviation of the tracheal air column at the level of the aortic arch. Atherosclerotic plaque within a tortuous and potentially mildly ectatic thoracic aorta. There is persistent thickening of the right paratracheal stripe presumably secondary to prominent vasculature. Mild cephalization of flow without frank evidence of edema. Grossly unchanged bibasilar heterogeneous opacities, left greater than right. No new focal airspace opacities. No pleural effusion or pneumothorax. No acute osseus abnormalities. Surgical clips overlie the upper outer quadrant of the left breast. IMPRESSION: Similar findings of cardiomegaly, pulmonary venous congestion and bibasilar atelectasis without superimposed acute cardiopulmonary disease on this  slightly hypoventilated examination. Electronically Signed   By: Sandi Mariscal M.D.   On: 05/07/2018 14:15    Procedures .Critical Care Performed by: Daleen Bo, MD Authorized by: Daleen Bo, MD   Critical care provider statement:    Critical care time (minutes):  35   Critical care start time:  05/07/2018 3:02 PM   Critical care end time:  05/07/2018 4:42 PM   Critical care time was exclusive of:  Separately billable procedures and treating other patients   Critical care was necessary to treat or prevent imminent or life-threatening deterioration of the following conditions:  Respiratory failure   Critical care was time spent personally by me on the following activities:  Blood draw for specimens, development of treatment plan with patient or surrogate, discussions with consultants, evaluation of patient's response to treatment, examination of patient, obtaining history from patient or surrogate, ordering and performing treatments and interventions, ordering and review of laboratory studies, pulse oximetry, re-evaluation of patient's condition, review of old charts and ordering and review of radiographic studies   (including  critical care time)  Medications Ordered in ED Medications  predniSONE (DELTASONE) tablet 60 mg (has no administration in time range)  doxycycline (VIBRA-TABS) tablet 100 mg (100 mg Oral Given 05/07/18 1606)  ipratropium-albuterol (DUONEB) 0.5-2.5 (3) MG/3ML nebulizer solution 3 mL (3 mLs Nebulization Given 05/07/18 1540)     Initial Impression / Assessment and Plan / ED Course  I have reviewed the triage vital signs and the nursing notes.  Pertinent labs & imaging results that were available during my care of the patient were reviewed by me and considered in my medical decision making (see chart for details).  Clinical Course as of May 08 1655  Sat May 07, 2018  1510 Normal  CBC with Differential [EW]  1510 Normal except sodium low, glucose high, creatinine high, calcium low, total protein high, albumin low, GFR low  Comprehensive metabolic panel(!) [EW]  4128 Venous congestion without overt CHF, infiltrate or pneumothorax.  Images reviewed by me  DG Chest 2 View [EW]  1539 Oxygen saturation on arrival 88 to 89%.  Placed on nasal cannula oxygen with improvement to 97%.   [EW]  19 Family members now here and report that the patient typically has "bronchitis," every spring and fall.  She has been told that she had COPD in the past and treated with Advair but that is not currently being prescribed for her.  He has seen a PCP recently but family members are not comfortable with the care provided.  They are seeking another primary care provider.   [EW]    Clinical Course User Index [EW] Daleen Bo, MD     Patient Vitals for the past 24 hrs:  BP Temp Temp src Pulse Resp SpO2 Height Weight  05/07/18 1600 (!) 136/96 - - 70 - 94 % - -  05/07/18 1540 - - - - - 91 % - -  05/07/18 1530 134/64 - - - - - - -  05/07/18 1523 - - - - - (!) 88 % - -  05/07/18 1438 - - - - - 94 % - -  05/07/18 1430 124/61 - - 83 - 90 % - -  05/07/18 1318 - - - - - 91 % - -  05/07/18 1317 (!) 117/55 98.4  F (36.9 C) Temporal 86 20 90 % 5\' 6"  (1.676 m) 105.2 kg    4:34 PM Reevaluation with update and discussion. After initial assessment and treatment, an  updated evaluation reveals comfortable on oxygen, 2 L with sats 94 to 95%.  Findings discussed with patient and family members, all questions answered. Daleen Bo   Medical Decision Making: Rhonchi this with likely COPD.  Patient with bronchitis which does not improve with nebulizer treatment.  Patient will require support, with oxygen treatment, and observed expectant management.  She could potentially improve with medication treatment but likely will need to be sent home with oxygen, which cannot be obtained from the emergency department setting.  Therefore the patient will need to stay in the hospital.  CRITICAL CARE-yes Performed by: Daleen Bo   Nursing Notes Reviewed/ Care Coordinated Applicable Imaging Reviewed Interpretation of Laboratory Data incorporated into ED treatment   4:39 PM-Consult complete with hospitalist.. Patient case explained and discussed.  HeHe agrees to admit patient for further evaluation and treatment. Call ended at 4 4 4  PM  Plan: Admit    Final Clinical Impressions(s) / ED Diagnoses   Final diagnoses:  Bronchitis  Hypoxia    ED Discharge Orders    None       Daleen Bo, MD 05/07/18 1657

## 2018-05-07 NOTE — H&P (Signed)
History and Physical  DARCEY CARDY ZTI:[id number] DOB: 07-28-1924 DOA: 05/07/2018  Referring physician: Dr Eulis Foster, ED physician PCP: Lorene Dy, MD  Outpatient Specialists:  Patient Coming From: [facility] living facility  Chief Complaint: SOB, cough  HPI: Sheena [name] is a 84 y.o. female with a history of Asthma/COPD, DM2 on insulin, HTN, h/o melanoma, obesity. Patient lives at assisted living facility [facility]). Patient started having SOB with cough and purulent sputum production. Symptoms are worsening. Worse with exertion, improved at rest. No fevers, chills, nausea, vomiting, orthopnea, swelling.  Emergency Department Course: CXR shows mild pulmonary congestion. WBC 7.7  Review of Systems:   Pt denies any fevers, chills, nausea, vomiting, diarrhea, constipation, abdominal pain, shortness of breath, dyspnea on exertion, orthopnea, cough, wheezing, palpitations, headache, vision changes, lightheadedness, dizziness, melena, rectal bleeding.  Review of systems are otherwise negative  Past Medical History:  Diagnosis Date  . Asthma    treated by Dr. Donneta Romberg  . Depression   . Diabetes mellitus, type 2 ([facility] [place])   . H/O: hysterectomy 1973   fibroids  . Hypertension   . Left arm pain   . Lumbago   . Melanoma ([place])    left arm, resected, annual CXR's ordered  . Syncope 08/2013   Suspected vagal-mediated   Past Surgical History:  Procedure Laterality Date  . ABDOMINAL HYSTERECTOMY  1973   fibroid uterus  . BREAST BIOPSY  1985   normal  . JOINT REPLACEMENT   2003   Left Hip  . TOTAL HIP ARTHROPLASTY  2003   left, Dr. Durward Fortes  . TRANSTHORACIC ECHOCARDIOGRAM  08/23/13   EF 60-65%, borderline concentric LVH, impaired LV diastolic relaxation, LA ULN   Social History:  reports that she has never smoked. She has never used smokeless tobacco. She reports that she does not drink alcohol or use drugs. Patient [facility] at [facility]  No Known Allergies  Family  History  Problem Relation Age of Onset  . Diabetes Mother   . Heart disease Mother        CHF  . Heart disease Father   . Diabetes Father       Prior to Admission medications   Medication Sig Start Date End Date Taking? Authorizing Provider  ALPRAZolam [name]) 1 MG tablet Take 1 mg by mouth daily.    Yes [provider]  atorvastatin (LIPITOR) 40 MG tablet TAKE ONE TABLET BY MOUTH EVERY DAY. Patient taking differently: Take 40 mg by mouth every evening.  06/12/14  Yes [name], MD  LANTUS SOLOSTAR 100 UNIT/ML Solostar Pen INJECT 16 UNITS SUBCUTANEOUSLY AT BEDTIME. Patient taking differently: Inject 20 Units into the skin 2 (two) times daily.  04/11/14  Yes [name], MD  QUEtiapine (SEROQUEL) 25 MG tablet Take 25 mg by mouth 2 (two) times daily.   Yes [provider]  sertraline (ZOLOFT) 50 MG tablet Take 50 mg by mouth daily.   Yes [provider]  traMADol (ULTRAM) 50 MG tablet Take 1 tablet (50 mg total) by mouth every 6 (six) hours as needed. 06/29/15  Yes [name], MD  triamcinolone cream (KENALOG) 0.1 % Apply 1 application topically 2 (two) times daily.   Yes [provider]    Physical Exam: BP (!) 168/65   Pulse 68   Temp 98.4 F (36.9 C) (Temporal)   Resp 20   Ht 5\' 6"  (1.676 m)   Wt 105.2 kg   SpO2 92%  BMI 37.45 kg/m   . General: Elderly female. Awake and alert and oriented x3. No acute cardiopulmonary distress.  Marland Kitchen HEENT: Normocephalic atraumatic.  Right and left ears normal in appearance.  Pupils equal, round, reactive to light. Extraocular muscles are intact. Sclerae anicteric and noninjected.  Moist mucosal membranes. No mucosal lesions.  . Neck: Neck supple without lymphadenopathy. No carotid bruits. No masses palpated.  . Cardiovascular: Regular rate with normal S1-S2 sounds. No murmurs, rubs, gallops auscultated. No JVD.  Marland Kitchen Respiratory: Inspiratory and expiratory coarse wheezing. No accessory muscle  use. . Abdomen: Soft, nontender, nondistended. Active bowel sounds. No masses or hepatosplenomegaly  . Skin: No rashes, lesions, or ulcerations.  Dry, warm to touch. 2+ dorsalis pedis and radial pulses. . Musculoskeletal: No calf or leg pain. All major joints not erythematous nontender.  No upper or lower joint deformation.  Good ROM.  No contractures  . Psychiatric: Intact judgment and insight. Pleasant and cooperative. . Neurologic: No focal neurological deficits. Strength is 5/5 and symmetric in upper and lower extremities.  Cranial nerves II through XII are grossly intact.           Labs on Admission: I have personally reviewed following labs and imaging studies  CBC: Recent Labs  Lab 05/07/18 1351  WBC 7.7  NEUTROABS 5.0  HGB 12.8  HCT 38.8  MCV 92.4  PLT 749   Basic Metabolic Panel: Recent Labs  Lab 05/07/18 1351  NA 132*  K 3.7  CL 99  CO2 25  GLUCOSE 209*  BUN 23  CREATININE 1.30*  CALCIUM 8.2*   GFR: Estimated Creatinine Clearance: 33.9 mL/min (A) (by C-G formula based on SCr of 1.3 mg/dL (H)). Liver Function Tests: Recent Labs  Lab 05/07/18 1351  AST 36  ALT 31  ALKPHOS 111  BILITOT 0.7  PROT 8.6*  ALBUMIN 3.4*   No results for input(s): LIPASE, AMYLASE in the last 168 hours. No results for input(s): AMMONIA in the last 168 hours. Coagulation Profile: No results for input(s): INR, PROTIME in the last 168 hours. Cardiac Enzymes: No results for input(s): CKTOTAL, CKMB, CKMBINDEX, TROPONINI in the last 168 hours. BNP (last 3 results) No results for input(s): PROBNP in the last 8760 hours. HbA1C: No results for input(s): HGBA1C in the last 72 hours. CBG: No results for input(s): GLUCAP in the last 168 hours. Lipid Profile: No results for input(s): CHOL, HDL, LDLCALC, TRIG, CHOLHDL, LDLDIRECT in the last 72 hours. Thyroid Function Tests: No results for input(s): TSH, T4TOTAL, FREET4, T3FREE, THYROIDAB in the last 72 hours. Anemia Panel: No  results for input(s): VITAMINB12, FOLATE, FERRITIN, TIBC, IRON, RETICCTPCT in the last 72 hours. Urine analysis:    Component Value Date/Time   COLORURINE STRAW (A) 12/11/2016 1010   APPEARANCEUR CLEAR 12/11/2016 1010   APPEARANCEUR Clear 03/05/2014 2034   LABSPEC 1.016 12/11/2016 1010   LABSPEC 1.010 03/05/2014 2034   PHURINE 7.0 12/11/2016 1010   GLUCOSEU NEGATIVE 12/11/2016 1010   GLUCOSEU Negative 03/05/2014 2034   HGBUR NEGATIVE 12/11/2016 1010   BILIRUBINUR NEGATIVE 12/11/2016 1010   BILIRUBINUR Negative 03/05/2014 2034   KETONESUR NEGATIVE 12/11/2016 1010   PROTEINUR NEGATIVE 12/11/2016 1010   UROBILINOGEN 0.2 06/13/2013 1148   NITRITE NEGATIVE 12/11/2016 1010   LEUKOCYTESUR NEGATIVE 12/11/2016 1010   LEUKOCYTESUR 1+ 03/05/2014 2034   Sepsis Labs: @LABRCNTIP (procalcitonin:4,lacticidven:4) )No results found for this or any previous visit (from the past 240 hour(s)).   Radiological Exams on Admission: Dg Chest 2 View  Result Date: 05/07/2018  CLINICAL DATA:  Cough. History of asthma, diabetes, hypertension and malignancy. EXAM: CHEST - 2 VIEW COMPARISON:  04/30/2016 FINDINGS: Grossly unchanged enlarged cardiac silhouette and mediastinal contours given reduced lung volumes and patient rotation. There is minimal deviation of the tracheal air column at the level of the aortic arch. Atherosclerotic plaque within a tortuous and potentially mildly ectatic thoracic aorta. There is persistent thickening of the right paratracheal stripe presumably secondary to prominent vasculature. Mild cephalization of flow without frank evidence of edema. Grossly unchanged bibasilar heterogeneous opacities, left greater than right. No new focal airspace opacities. No pleural effusion or pneumothorax. No acute osseus abnormalities. Surgical clips overlie the upper outer quadrant of the left breast. IMPRESSION: Similar findings of cardiomegaly, pulmonary venous congestion and bibasilar atelectasis without  superimposed acute cardiopulmonary disease on this slightly hypoventilated examination. Electronically Signed   By: Sandi Mariscal M.D.   On: 05/07/2018 14:15    Assessment/Plan: Active Problems:   COPD with acute exacerbation (HCC)   Obesity (BMI 30-39.9)   Mild cognitive impairment with memory loss   Acute respiratory failure with hypoxia [facility])    This patient was discussed with the ED physician, including pertinent vitals, physical exam findings, labs, and imaging.  We also discussed care given by the ED provider.  1. Acute respiratory failure a. Supplemental O2 2. COPD with acute exacerbation Antibiotics: doxycycline DuoNeb's every 6 scheduled with albuterol every 2 when necessary Continue inhaled steroids and LA bronchodilator Prednisone 40mg  daily Mucinex 3. Obesity 4. Dementia  DVT prophylaxis: Lovenox Consultants: none Code Status: Full code Family Communication: daughter in room during interview and exam  Disposition Plan: back to assisted living facility after improvement   Truett Mainland, DO [facility] Pager [phone]  If 7PM-7AM, please contact night-coverage [email or web address] Password [email or web address]

## 2018-05-07 NOTE — ED Triage Notes (Signed)
Pt from [facility] URI sx Per care giver, lots of patients with similar sx there, but pt's son wanted her checked out

## 2018-05-08 ENCOUNTER — Encounter (HOSPITAL_COMMUNITY): Payer: Self-pay | Admitting: General Practice

## 2018-05-08 DIAGNOSIS — Z794 Long term (current) use of insulin: Secondary | ICD-10-CM

## 2018-05-08 DIAGNOSIS — J9601 Acute respiratory failure with hypoxia: Secondary | ICD-10-CM

## 2018-05-08 DIAGNOSIS — E119 Type 2 diabetes mellitus without complications: Secondary | ICD-10-CM

## 2018-05-08 DIAGNOSIS — J441 Chronic obstructive pulmonary disease with (acute) exacerbation: Principal | ICD-10-CM

## 2018-05-08 DIAGNOSIS — I10 Essential (primary) hypertension: Secondary | ICD-10-CM

## 2018-05-08 LAB — GLUCOSE, CAPILLARY
GLUCOSE-CAPILLARY: 325 mg/dL — AB (ref 70–99)
Glucose-Capillary: 294 mg/dL — ABNORMAL HIGH (ref 70–99)
Glucose-Capillary: 289 mg/dL — ABNORMAL HIGH (ref 70–99)
Glucose-Capillary: 429 mg/dL — ABNORMAL HIGH (ref 70–99)
Glucose-Capillary: 471 mg/dL — ABNORMAL HIGH (ref 70–99)

## 2018-05-08 MED ORDER — ORAL CARE MOUTH RINSE
15.0000 mL | Freq: Two times a day (BID) | OROMUCOSAL | Status: DC
  Administered 2018-05-08 – 2018-05-09 (×4): 15 mL via OROMUCOSAL

## 2018-05-08 MED ORDER — INFLUENZA VAC SPLIT HIGH-DOSE 0.5 ML IM SUSY: 0.5000 mL | PREFILLED_SYRINGE | INTRAMUSCULAR | Status: DC

## 2018-05-08 MED ORDER — IPRATROPIUM-ALBUTEROL 0.5-2.5 (3) MG/3ML IN SOLN
3.0000 mL | Freq: Three times a day (TID) | RESPIRATORY_TRACT | Status: DC
  Administered 2018-05-09 (×3): 3 mL via RESPIRATORY_TRACT
  Filled 2018-05-08 (×2): qty 3

## 2018-05-08 NOTE — Progress Notes (Signed)
PROGRESS NOTE    Sheena [name]  OXB:[id number] DOB: [birth year] DOA: 05/07/2018 PCP: Lorene Dy, MD    Brief Narrative:  84 year old female with a history of asthma/COPD, diabetes on insulin, who is a resident of an assisted living facility, presented with shortness of breath and cough as well as wheezing.  Found to have COPD exacerbation.  Started on antibiotics, nebs and steroids.  She is slowly improving.  Anticipate discharge in the next 24 hours if she continues to improve.   Assessment & Plan:   Principal Problem:   Acute respiratory failure with hypoxia (HCC) Active Problems:   COPD with acute exacerbation (HCC)   Obesity (BMI 30-39.9)   Mild cognitive impairment with memory loss   1. Acute respiratory failure with hypoxia.  Wean off oxygen as tolerated. 2. COPD with acute exacerbation.  Started on doxycycline and bronchodilators.  Clinically appears to be improving.  Started on prednisone therapy.  Still has some wheezing.  Will monitor for another 24 hours.  If wheezing is resolved, anticipate discharge tomorrow. 3. Dementia.  No behavioral disturbances at this time.  Continue on Seroquel and Xanax. 4. Diabetes.  Patient having episodes of hyperglycemia.  Likely related to steroids.  Continue on Lantus and sliding scale insulin.  Anticipate blood sugar should improve as steroids are tapered. 5. Hyperlipidemia.  Continue on statin   DVT prophylaxis: Lovenox Code Status: Full code Family Communication: Discussed with daughter and son-in-law at the bedside Disposition Plan: Discharge back to assisted living once respiratory status and blood sugars have stabilized  Severity of Illness: The appropriate patient status for this patient is INPATIENT. Inpatient status is judged to be reasonable and necessary in order to provide the required intensity of service to ensure the patient's safety. The patient's presenting symptoms, physical exam findings, and initial radiographic  and laboratory data in the context of their chronic comorbidities is felt to place them at high risk for further clinical deterioration. Furthermore, it is not anticipated that the patient will be medically stable for discharge from the hospital within 2 midnights of admission. The following factors support the patient status of inpatient.    "           The patient's presenting symptoms include  shortness of breath, cough and wheezing "           The worrisome physical exam findings include  bilateral wheezes with increased work of breathing on exertion "           The initial radiographic and laboratory data are worrisome because of  bronchitic changes noted on chest x-ray with vascular congestion "           The chronic co-morbidities include  diabetes, COPD     * I certify that at the point of admission it is my clinical judgment that the patient will require inpatient hospital care spanning beyond 2 midnights from the point of admission due to high intensity of service, high risk for further deterioration and high frequency of surveillance required.*    Consultants:     Procedures:     Antimicrobials:   Doxycycline 9/28 >   Subjective: Feels better today.  Feels shortness of breath is improving.  Continues to have a cough.  Objective: Vitals:   05/08/18 0539 05/08/18 1039 05/08/18 1415 05/08/18 1659  BP: (!) 155/65  (!) 133/53   Pulse: 72  72   Resp: 16  18   Temp: 98.4 F (36.9 C)  97.6 F (  36.4 C)   TempSrc: Oral  Oral   SpO2: 93% 96% 91% 91%  Weight:      Height:        Intake/Output Summary (Last 24 hours) at 05/08/2018 1839 Last data filed at 05/08/2018 0900 Gross per 24 hour  Intake 240 ml  Output 100 ml  Net 140 ml   Filed Weights   05/07/18 1317 05/07/18 1952  Weight: 105.2 kg 102.8 kg    Examination:  General exam: Appears calm and comfortable  Respiratory system: Bilateral wheezes. Respiratory effort normal. Cardiovascular system: S1 & S2 heard,  RRR. No JVD, murmurs, rubs, gallops or clicks. No pedal edema. Gastrointestinal system: Abdomen is nondistended, soft and nontender. No organomegaly or masses felt. Normal bowel sounds heard. Central nervous system: No focal neurological deficits. Extremities: Symmetric 5 x 5 power. Skin: No rashes, lesions or ulcers Psychiatry: Pleasant, mildly confused    Data Reviewed: I have personally reviewed following labs and imaging studies  CBC: Recent Labs  Lab 05/07/18 1351  WBC 7.7  NEUTROABS 5.0  HGB 12.8  HCT 38.8  MCV 92.4  PLT 580   Basic Metabolic Panel: Recent Labs  Lab 05/07/18 1351  NA 132*  K 3.7  CL 99  CO2 25  GLUCOSE 209*  BUN 23  CREATININE 1.30*  CALCIUM 8.2*   GFR: Estimated Creatinine Clearance: 32.8 mL/min (A) (by C-G formula based on SCr of 1.3 mg/dL (H)). Liver Function Tests: Recent Labs  Lab 05/07/18 1351  AST 36  ALT 31  ALKPHOS 111  BILITOT 0.7  PROT 8.6*  ALBUMIN 3.4*   No results for input(s): LIPASE, AMYLASE in the last 168 hours. No results for input(s): AMMONIA in the last 168 hours. Coagulation Profile: No results for input(s): INR, PROTIME in the last 168 hours. Cardiac Enzymes: No results for input(s): CKTOTAL, CKMB, CKMBINDEX, TROPONINI in the last 168 hours. BNP (last 3 results) No results for input(s): PROBNP in the last 8760 hours. HbA1C: No results for input(s): HGBA1C in the last 72 hours. CBG: Recent Labs  Lab 05/07/18 2147 05/08/18 0738 05/08/18 1137 05/08/18 1624  GLUCAP 357* 325* 294* 289*   Lipid Profile: No results for input(s): CHOL, HDL, LDLCALC, TRIG, CHOLHDL, LDLDIRECT in the last 72 hours. Thyroid Function Tests: No results for input(s): TSH, T4TOTAL, FREET4, T3FREE, THYROIDAB in the last 72 hours. Anemia Panel: No results for input(s): VITAMINB12, FOLATE, FERRITIN, TIBC, IRON, RETICCTPCT in the last 72 hours. Sepsis Labs: No results for input(s): PROCALCITON, LATICACIDVEN in the last 168  hours.  No results found for this or any previous visit (from the past 240 hour(s)).       Radiology Studies: Dg Chest 2 View  Result Date: 05/07/2018 CLINICAL DATA:  Cough. History of asthma, diabetes, hypertension and malignancy. EXAM: CHEST - 2 VIEW COMPARISON:  04/30/2016 FINDINGS: Grossly unchanged enlarged cardiac silhouette and mediastinal contours given reduced lung volumes and patient rotation. There is minimal deviation of the tracheal air column at the level of the aortic arch. Atherosclerotic plaque within a tortuous and potentially mildly ectatic thoracic aorta. There is persistent thickening of the right paratracheal stripe presumably secondary to prominent vasculature. Mild cephalization of flow without frank evidence of edema. Grossly unchanged bibasilar heterogeneous opacities, left greater than right. No new focal airspace opacities. No pleural effusion or pneumothorax. No acute osseus abnormalities. Surgical clips overlie the upper outer quadrant of the left breast. IMPRESSION: Similar findings of cardiomegaly, pulmonary venous congestion and bibasilar atelectasis without superimposed acute  cardiopulmonary disease on this slightly hypoventilated examination. Electronically Signed   By: Sandi Mariscal M.D.   On: 05/07/2018 14:15        Scheduled Meds: . ALPRAZolam  1 mg Oral Daily  . atorvastatin  40 mg Oral QPM  . doxycycline  100 mg Oral Q12H  . enoxaparin (LOVENOX) injection  50 mg Subcutaneous Q24H  . [START ON 05/09/2018] Influenza vac split quadrivalent PF  0.5 mL Intramuscular Tomorrow-1000  . insulin aspart  0-20 Units Subcutaneous TID WC  . insulin aspart  0-5 Units Subcutaneous QHS  . insulin glargine  20 Units Subcutaneous BID  . ipratropium-albuterol  3 mL Nebulization Q6H  . mouth rinse  15 mL Mouth Rinse BID  . mometasone-formoterol  1 puff Inhalation BID  . predniSONE  40 mg Oral Q breakfast  . QUEtiapine  25 mg Oral BID  . sertraline  50 mg Oral Daily    Continuous Infusions:   LOS: 1 day    Time spent: 35 minutes    Kathie Dike, MD [facility] Pager [phone]  If 7PM-7AM, please contact night-coverage [email or web address] Password [facility] 05/08/2018, 6:39 PM

## 2018-05-08 NOTE — Progress Notes (Signed)
Pt ambulated with SpO2 monitoring.  Pt saturation dropped to 88% with sitting once O2 removed.  Sturation to 94% with oxygen replaced.  Pt ambulated 150 feet with saturation maintanined above 94 % with oxygen in place and saturation fell to 88% with oxygen removed.  Pt noted with SOB with ambulation and no oxygen.  Pt tolerated moderate paced walk well and was assisted by front wheel walker.   05/07/18 2300 05/07/18 2305 05/07/18 2306  Oxygen Therapy  Position Sitting Sitting Ambulating  Symptoms None none none  SpO2 96 % (!) 88 % 94 %  O2 Device Nasal Cannula Room Air Nasal Cannula  O2 Flow Rate (L/min) 2 L/min  --  2 L/min    05/07/18 2310 05/07/18 2311  Oxygen Therapy  Position Ambulating Ambulating  Symptoms SOB SOB  SpO2 (!) 88 % 94 %  O2 Device Room Air Nasal Cannula  O2 Flow Rate (L/min)  --  2 L/min

## 2018-05-08 NOTE — Evaluation (Signed)
Physical Therapy Evaluation Patient Details Name: MINDY GALI MRN: [id number] DOB: 12/11/1923 Today's Date: 05/08/2018   History of Present Illness   ELEA HOLTZCLAW is a 84 y.o. female with a history of Asthma/COPD, DM2 on insulin, HTN, h/o melanoma, obesity. Patient lives at assisted living facility [facility]). Patient started having SOB with cough and purulent sputum production. Symptoms are worsening. Worse with exertion, improved at rest. No fevers, chills, nausea, vomiting, orthopnea, swelling.    Clinical Impression  Patient demonstrates slightly labored movement for sitting up, sit to stands, transfers and ambulation in hallway, mostly limited due SOB and coughing, O2 saturations betweekn 91-95% while on room air during treatment and patient tolerated sitting up in chair after therapy.  Patient states she is not on home O2 and left on room air after therapy - RN notified.  Patient will benefit from continued physical therapy in hospital and recommended venue below to increase strength, balance, endurance for safe ADLs and gait.    Follow Up Recommendations Home health PT;Supervision for mobility/OOB;Supervision/Assistance - 24 hour    Equipment Recommendations  None recommended by PT    Recommendations for Other Services       Precautions / Restrictions Precautions Precautions: Fall Restrictions Weight Bearing Restrictions: No      Mobility  Bed Mobility Overal bed mobility: Needs Assistance Bed Mobility: Supine to Sit     Supine to sit: Min assist     General bed mobility comments: slightly labored movement  Transfers Overall transfer level: Needs assistance Equipment used: Rolling walker (2 wheeled) Transfers: Sit to/from [facility] Sit to Stand: Min guard Stand pivot transfers: Min guard       General transfer comment: labored movement  Ambulation/Gait Ambulation/Gait assistance: Min guard Gait Distance (Feet): 45 Feet Assistive  device: Rolling walker (2 wheeled) Gait Pattern/deviations: Decreased step length - right;Decreased stride length Gait velocity: decreased   General Gait Details: slightly labored cadence without loss of balance, once fatigued becomes SOB, requiring more assistance for safety, on room air with O2 saturation between 92-95  Stairs            Wheelchair Mobility    Modified Rankin (Stroke Patients Only)       Balance Overall balance assessment: Needs assistance Sitting-balance support: Feet supported;No upper extremity supported Sitting balance-Leahy Scale: Good     Standing balance support: During functional activity;Bilateral upper extremity supported Standing balance-Leahy Scale: Fair Standing balance comment: using RW                             Pertinent Vitals/Pain Pain Assessment: No/denies pain    Home Living Family/patient expects to be discharged to:: Assisted living               Home Equipment: Walker - 2 wheels      Prior Function Level of Independence: Needs assistance   Gait / Transfers Assistance Needed: household ambulator with RW (per patient)  ADL's / Homemaking Assistance Needed: assisted by ALF staff        Hand Dominance        Extremity/Trunk Assessment   Upper Extremity Assessment Upper Extremity Assessment: Generalized weakness    Lower Extremity Assessment Lower Extremity Assessment: Generalized weakness    Cervical / Trunk Assessment Cervical / Trunk Assessment: Normal  Communication   Communication: No difficulties(slow to respond to questions)  Cognition Arousal/Alertness: Awake/alert Behavior During Therapy: WFL for tasks assessed/performed Overall Cognitive Status:  Within Functional Limits for tasks assessed                                        General Comments      Exercises     Assessment/Plan    PT Assessment Patient needs continued PT services  PT Problem List  Decreased strength;Decreased activity tolerance;Decreased balance;Decreased mobility       PT Treatment Interventions Gait training;Stair training;Functional mobility training;Therapeutic activities;Therapeutic exercise;Patient/family education    PT Goals (Current goals can be found in the Care Plan section)  Acute Rehab PT Goals Patient Stated Goal: return home PT Goal Formulation: With patient Time For Goal Achievement: 05/14/18 Potential to Achieve Goals: Good    Frequency Min 3X/week   Barriers to discharge        Co-evaluation               AM-PAC PT "6 Clicks" Daily Activity  Outcome Measure Difficulty turning over in bed (including adjusting bedclothes, sheets and blankets)?: None Difficulty moving from lying on back to sitting on the side of the bed? : A Little Difficulty sitting down on and standing up from a chair with arms (e.g., wheelchair, bedside commode, etc,.)?: A Little Help needed moving to and from a bed to chair (including a wheelchair)?: A Little Help needed walking in hospital room?: A Little Help needed climbing 3-5 steps with a railing? : A Lot 6 Click Score: 18    End of Session   Activity Tolerance: Patient tolerated treatment well;Patient limited by fatigue Patient left: in chair;with call bell/phone within reach;with bed alarm set Nurse Communication: Mobility status;Other (comment)(RN notified that patient left up in chair) PT Visit Diagnosis: Unsteadiness on feet (R26.81);Other abnormalities of gait and mobility (R26.89);Muscle weakness (generalized) (M62.81)    Time: 8466-5993 PT Time Calculation (min) (ACUTE ONLY): 27 min   Charges:   PT Evaluation $PT Eval Moderate Complexity: 1 Mod PT Treatments $Therapeutic Activity: 23-37 mins        12:57 PM, 05/08/18 Lonell Grandchild, MPT Physical Therapist with [facility] [phone] office [phone] mobile phone

## 2018-05-08 NOTE — Plan of Care (Signed)
  Problem: Acute Rehab PT Goals(only PT should resolve) Goal: Pt Will Go Supine/Side To Sit Outcome: Progressing Flowsheets (Taken 05/08/2018 1258) Pt will go Supine/Side to Sit: with supervision Goal: Patient Will Transfer Sit To/From Stand Outcome: Progressing Flowsheets (Taken 05/08/2018 1258) Patient will transfer sit to/from stand: with supervision Goal: Pt Will Transfer Bed To Chair/Chair To Bed Outcome: Progressing Flowsheets (Taken 05/08/2018 1258) Pt will Transfer Bed to Chair/Chair to Bed: with supervision Goal: Pt Will Ambulate Outcome: Progressing Flowsheets (Taken 05/08/2018 1258) Pt will Ambulate: 50 feet; with supervision; with rolling walker   12:59 PM, 05/08/18 Lonell Grandchild, MPT Physical Therapist with [facility] 336 [phone] office [phone] mobile phone

## 2018-05-09 DIAGNOSIS — G3184 Mild cognitive impairment, so stated: Secondary | ICD-10-CM

## 2018-05-09 LAB — HIV ANTIBODY (ROUTINE TESTING W REFLEX): HIV Screen 4th Generation wRfx: NONREACTIVE

## 2018-05-09 LAB — GLUCOSE, CAPILLARY
GLUCOSE-CAPILLARY: 256 mg/dL — AB (ref 70–99)
Glucose-Capillary: 202 mg/dL — ABNORMAL HIGH (ref 70–99)

## 2018-05-09 MED ORDER — TIOTROPIUM BROMIDE MONOHYDRATE 18 MCG IN CAPS: 18.0000 ug | ORAL_CAPSULE | Freq: Every day | RESPIRATORY_TRACT | 2 refills | Status: DC

## 2018-05-09 MED ORDER — INSULIN GLARGINE 100 UNIT/ML ~~LOC~~ SOLN
25.0000 [IU] | Freq: Two times a day (BID) | SUBCUTANEOUS | Status: DC
  Administered 2018-05-09: 25 [IU] via SUBCUTANEOUS
  Filled 2018-05-09 (×3): qty 0.25

## 2018-05-09 MED ORDER — DM-GUAIFENESIN ER 30-600 MG PO TB12: 1.0000 | ORAL_TABLET | Freq: Two times a day (BID) | ORAL | 0 refills | Status: DC

## 2018-05-09 MED ORDER — INSULIN ASPART 100 UNIT/ML ~~LOC~~ SOLN
5.0000 [IU] | Freq: Three times a day (TID) | SUBCUTANEOUS | Status: DC
  Administered 2018-05-09: 5 [IU] via SUBCUTANEOUS

## 2018-05-09 MED ORDER — LORATADINE 10 MG PO TABS: 10.0000 mg | ORAL_TABLET | Freq: Every day | ORAL | 2 refills | Status: DC

## 2018-05-09 MED ORDER — ALBUTEROL SULFATE (2.5 MG/3ML) 0.083% IN NEBU: 2.5000 mg | INHALATION_SOLUTION | Freq: Four times a day (QID) | RESPIRATORY_TRACT | 12 refills | Status: DC | PRN

## 2018-05-09 MED ORDER — PREDNISONE 10 MG PO TABS: ORAL_TABLET | ORAL | 0 refills | Status: DC

## 2018-05-09 MED ORDER — GLUCERNA SHAKE PO LIQD: 237.0000 mL | Freq: Two times a day (BID) | ORAL | Status: DC

## 2018-05-09 MED ORDER — INSULIN GLARGINE 100 UNIT/ML SOLOSTAR PEN: 25.0000 [IU] | PEN_INJECTOR | Freq: Two times a day (BID) | SUBCUTANEOUS | 1 refills | Status: DC

## 2018-05-09 MED ORDER — DOXYCYCLINE HYCLATE 100 MG PO TABS: 100.0000 mg | ORAL_TABLET | Freq: Two times a day (BID) | ORAL | 0 refills | Status: DC

## 2018-05-09 MED ORDER — MOMETASONE FURO-FORMOTEROL FUM 200-5 MCG/ACT IN AERO: 1.0000 | INHALATION_SPRAY | Freq: Two times a day (BID) | RESPIRATORY_TRACT | 1 refills | Status: DC

## 2018-05-09 NOTE — Progress Notes (Addendum)
Initial Nutrition Assessment  DOCUMENTATION CODES:   Obesity unspecified  INTERVENTION:  Glucerna Shake po BID, each supplement provides 220 kcal and 10 grams of protein   Offered education   Heart Healthy/CHO modified diet   NUTRITION DIAGNOSIS:   Increased nutrient needs related to acute illness(respiratory failure) as evidenced by estimated needs.   GOAL:   Patient will meet greater than or equal to 90% of their needs   MONITOR:   PO intake, Weight trends, Labs  REASON FOR ASSESSMENT:   Consult Assessment of nutrition requirement/status, COPD Protocol  ASSESSMENT:  Patient is a 84 yo female from  [place]. PMH: Dementia, DM-2, CHF, Melanoma, HTN, renal insufficieny multiple myeloma, sickle cell anemia.  She presents with acute respiratory failure.   Daughter is here and provided history. Patient has maintained a good appetite. Her intake has been sufficient to sustain usual body weight noted below. Facility diet is low sodium/ consisent cho with regular consistency foods. Feeds herself. PO: 50% of breakfast consumed this morning and 50-75% of meals yesterday. Will add Glucerna shakes to support adequacy given her acute change in respiratory status.   Weight hx shows stable between 103-105 kg the past 2 years. No overt signs of malnutrition. Patient is at risk due to her acute on chronic illness and advanced age.   Labs: Hyponatremia  BMP Latest Ref Rng & Units 05/07/2018 12/12/2016 12/11/2016  Glucose 70 - 99 mg/dL 209(H) 147(H) 150(H)  BUN 8 - 23 mg/dL '23 13 18  ' Creatinine 0.44 - 1.00 mg/dL 1.30(H) 1.03(H) 1.30(H)  Sodium 135 - 145 mmol/L 132(L) 132(L) 137  Potassium 3.5 - 5.1 mmol/L 3.7 3.7 4.0  Chloride 98 - 111 mmol/L 99 100(L) 100(L)  CO2 22 - 32 mmol/L 25 25 -  Calcium 8.9 - 10.3 mg/dL 8.2(L) 8.5(L) -     Medications reviewed and include: Lipitor, doxycycline, SSI, Lantus (25 units), prednisone, zoloft  NUTRITION - FOCUSED PHYSICAL EXAM:    Most Recent  Value  Orbital Region  No depletion  Upper Arm Region  No depletion  Thoracic and Lumbar Region  No depletion  Temple Region  No depletion  Clavicle Bone Region  Mild depletion  Hair  Reviewed  Eyes  Unable to assess  Mouth  Unable to assess  Skin  Reviewed      Diet Order:   Diet Order            Diet heart healthy/carb modified Room service appropriate? Yes; Fluid consistency: Thin  Diet effective now              EDUCATION NEEDS:   Education needs have been addressed   Skin:  Skin Assessment: Reviewed RN Assessment(scab formed over wound on right arm)  Last BM:  9/29  Height:   Ht Readings from Last 1 Encounters:  05/07/18 '5\' 5"'  (1.651 m)    Weight:   Wt Readings from Last 1 Encounters:  05/07/18 102.8 kg    Ideal Body Weight:  57 kg  BMI:  Body mass index is 37.71 kg/m.  Estimated Nutritional Needs:   Kcal:  1610-9604  (13-15 kcal/kg/bw)  Protein:  83-88 gr  Fluid:  <2 liters daily   Colman Cater MS,RD,CSG,LDN Office: [phone] Pager: [phone]

## 2018-05-09 NOTE — Progress Notes (Signed)
OT Cancellation Note  Patient Details Name: Sheena [name] MRN: [id number] DOB: Jun 27, 1924   Cancelled Treatment:    Reason Eval/Treat Not Completed: OT screened, no needs identified,patient is a current ALF resident and receives assistance for basic daily tasks, patient is safe to return to ALF with assistance from staff for ADL completion, no follow up OT services required at discharge, will sign off   Ailene Ravel, OTR/L,CBIS  [phone]  05/09/2018, 8:16 AM

## 2018-05-09 NOTE — Care Management (Signed)
Addendum to previous note: CM received call from [facility] rep. Pt active with [facility] pta. CM has notified Romualdo Bolk, [facility] rep that pt is active with another [facility].

## 2018-05-09 NOTE — Clinical Social Work Note (Signed)
LCSW spoke with Butch Penny at [facility] and advised that patient will discharge to day.     Fumiko Cham, Clydene Pugh, LCSW

## 2018-05-09 NOTE — Discharge Summary (Signed)
Physician Discharge Summary  Sheena [name] HWE:[id number] DOB: 1923-10-24 DOA: 05/07/2018  PCP: Lorene Dy, MD  Admit date: 05/07/2018 Discharge date: 05/09/2018  Admitted From: Assisted living facility Disposition: Assisted living facility  Recommendations for Outpatient Follow-up:  1. Follow up with PCP in 1-2 weeks 2. Please obtain BMP/CBC in one week  Home Health: PT, RN Equipment/Devices: Nebulizer machine  Discharge Condition: Stable.   CODE STATUS: Full code Diet recommendation: Heart healthy, carb modified  Brief/Interim Summary: 84 year old female with a history of asthma/COPD, diabetes on insulin, who is a resident of an assisted living facility, brought to the hospital with shortness of breath cough and wheezing.  Found to have COPD exacerbation/acute bronchitis.  Treated with antibiotics, nebulizer treatments and steroids.  From respiratory standpoint, she has improved.  She is now breathing comfortably on room air.  She continues to have some mild cough, but is not requiring any oxygen.  Wheezing is also improved with steroids.  She is been placed on a prednisone taper.  Will complete a course of antibiotics.  We will set the patient up with nebulizer treatments at her assisted living facility.  She has had episodes of hyperglycemia related to steroid use, but insulin has been adjusted.  Anticipate that blood sugar should be easier to manage as steroids are tapered.  She was seen by physical therapy who recommended returning back to assisted living facility.  The remainder of medical problems remained stable.  Discharge Diagnoses:  Principal Problem:   Acute respiratory failure with hypoxia (HCC) Active Problems:   COPD with acute exacerbation (HCC)   Obesity (BMI 30-39.9)   Mild cognitive impairment with memory loss   Hypertension   Diabetes mellitus, type 2 [facility])    Discharge Instructions  Discharge Instructions    DME Nebulizer machine   Complete by:  As  directed    Patient needs a nebulizer to treat with the following condition:  COPD (chronic obstructive pulmonary disease) ([place])   Diet - low sodium heart healthy   Complete by:  As directed    Increase activity slowly   Complete by:  As directed      Allergies as of 05/09/2018   No Known Allergies     Medication List    TAKE these medications   albuterol (2.5 MG/3ML) 0.083% nebulizer solution Commonly known as:  PROVENTIL Take 3 mLs (2.5 mg total) by nebulization every 6 (six) hours as needed for wheezing or shortness of breath.   ALPRAZolam 1 MG tablet Commonly known as:  XANAX Take 1 mg by mouth daily.   atorvastatin 40 MG tablet Commonly known as:  LIPITOR TAKE ONE TABLET BY MOUTH EVERY DAY. What changed:  when to take this   dextromethorphan-guaiFENesin 30-600 MG 12hr tablet Commonly known as:  MUCINEX DM Take 1 tablet by mouth 2 (two) times daily.   doxycycline 100 MG tablet Commonly known as:  VIBRA-TABS Take 1 tablet (100 mg total) by mouth every 12 (twelve) hours.   Insulin Glargine 100 UNIT/ML Solostar Pen Commonly known as:  LANTUS Inject 25 Units into the skin 2 (two) times daily. What changed:  See the new instructions.   loratadine 10 MG tablet Commonly known as:  CLARITIN Take 1 tablet (10 mg total) by mouth daily.   mometasone-formoterol 200-5 MCG/ACT Aero Commonly known as:  DULERA Inhale 1 puff into the lungs 2 (two) times daily.   predniSONE 10 MG tablet Commonly known as:  DELTASONE Take 40mg  po daily for 2 days then  30mg  daily for 2 days then 20mg  daily for 2 days then 10mg  daily for 2 days then stop   QUEtiapine 25 MG tablet Commonly known as:  SEROQUEL Take 25 mg by mouth 2 (two) times daily.   sertraline 50 MG tablet Commonly known as:  ZOLOFT Take 50 mg by mouth daily.   tiotropium 18 MCG inhalation capsule Commonly known as:  SPIRIVA Place 1 capsule (18 mcg total) into inhaler and inhale daily.   traMADol 50 MG  tablet Commonly known as:  ULTRAM Take 1 tablet (50 mg total) by mouth every 6 (six) hours as needed.   triamcinolone cream 0.1 % Commonly known as:  KENALOG Apply 1 application topically 2 (two) times daily.            Durable Medical Equipment  (From admission, onward)         Start     Ordered   05/09/18 0000  DME Nebulizer machine    Question:  Patient needs a nebulizer to treat with the following condition  Answer:  COPD (chronic obstructive pulmonary disease) ([place])   05/09/18 1327          No Known Allergies  Consultations:     Procedures/Studies: Dg Chest 2 View  Result Date: 05/07/2018 CLINICAL DATA:  Cough. History of asthma, diabetes, hypertension and malignancy. EXAM: CHEST - 2 VIEW COMPARISON:  04/30/2016 FINDINGS: Grossly unchanged enlarged cardiac silhouette and mediastinal contours given reduced lung volumes and patient rotation. There is minimal deviation of the tracheal air column at the level of the aortic arch. Atherosclerotic plaque within a tortuous and potentially mildly ectatic thoracic aorta. There is persistent thickening of the right paratracheal stripe presumably secondary to prominent vasculature. Mild cephalization of flow without frank evidence of edema. Grossly unchanged bibasilar heterogeneous opacities, left greater than right. No new focal airspace opacities. No pleural effusion or pneumothorax. No acute osseus abnormalities. Surgical clips overlie the upper outer quadrant of the left breast. IMPRESSION: Similar findings of cardiomegaly, pulmonary venous congestion and bibasilar atelectasis without superimposed acute cardiopulmonary disease on this slightly hypoventilated examination. Electronically Signed   By: Sandi Mariscal M.D.   On: 05/07/2018 14:15      Subjective: She is feeling better today.  Continues to have some cough, but overall wheezing has resolved.  Discharge Exam: Vitals:   05/08/18 2132 05/09/18 0520 05/09/18 0532  05/09/18 0956  BP: (!) 141/64 (!) 144/60    Pulse: 84 73    Resp: 18 17    Temp: 98.4 F (36.9 C) 98.2 F (36.8 C)    TempSrc: Oral     SpO2: 92% 90% 92% 92%  Weight:      Height:        General: Pt is alert, awake, not in acute distress Cardiovascular: RRR, S1/S2 +, no rubs, no gallops Respiratory: CTA bilaterally, no wheezing, no rhonchi Abdominal: Soft, NT, ND, bowel sounds + Extremities: no edema, no cyanosis    The results of significant diagnostics from this hospitalization (including imaging, microbiology, ancillary and laboratory) are listed below for reference.     Microbiology: No results found for this or any previous visit (from the past 240 hour(s)).   Labs: BNP (last 3 results) No results for input(s): BNP in the last 8760 hours. Basic Metabolic Panel: Recent Labs  Lab 05/07/18 1351  NA 132*  K 3.7  CL 99  CO2 25  GLUCOSE 209*  BUN 23  CREATININE 1.30*  CALCIUM 8.2*   Liver Function  Tests: Recent Labs  Lab 05/07/18 1351  AST 36  ALT 31  ALKPHOS 111  BILITOT 0.7  PROT 8.6*  ALBUMIN 3.4*   No results for input(s): LIPASE, AMYLASE in the last 168 hours. No results for input(s): AMMONIA in the last 168 hours. CBC: Recent Labs  Lab 05/07/18 1351  WBC 7.7  NEUTROABS 5.0  HGB 12.8  HCT 38.8  MCV 92.4  PLT 228   Cardiac Enzymes: No results for input(s): CKTOTAL, CKMB, CKMBINDEX, TROPONINI in the last 168 hours. BNP: Invalid input(s): POCBNP CBG: Recent Labs  Lab 05/08/18 1624 05/08/18 2134 05/08/18 2241 05/09/18 0740 05/09/18 1133  GLUCAP 289* 429* 471* 256* 202*   D-Dimer No results for input(s): DDIMER in the last 72 hours. Hgb A1c No results for input(s): HGBA1C in the last 72 hours. Lipid Profile No results for input(s): CHOL, HDL, LDLCALC, TRIG, CHOLHDL, LDLDIRECT in the last 72 hours. Thyroid function studies No results for input(s): TSH, T4TOTAL, T3FREE, THYROIDAB in the last 72 hours.  Invalid input(s):  FREET3 Anemia work up No results for input(s): VITAMINB12, FOLATE, FERRITIN, TIBC, IRON, RETICCTPCT in the last 72 hours. Urinalysis    Component Value Date/Time   COLORURINE STRAW (A) 12/11/2016 1010   APPEARANCEUR CLEAR 12/11/2016 1010   APPEARANCEUR Clear 03/05/2014 2034   LABSPEC 1.016 12/11/2016 1010   LABSPEC 1.010 03/05/2014 2034   PHURINE 7.0 12/11/2016 1010   GLUCOSEU NEGATIVE 12/11/2016 1010   GLUCOSEU Negative 03/05/2014 2034   HGBUR NEGATIVE 12/11/2016 1010   BILIRUBINUR NEGATIVE 12/11/2016 1010   BILIRUBINUR Negative 03/05/2014 2034   KETONESUR NEGATIVE 12/11/2016 1010   PROTEINUR NEGATIVE 12/11/2016 1010   UROBILINOGEN 0.2 06/13/2013 1148   NITRITE NEGATIVE 12/11/2016 1010   LEUKOCYTESUR NEGATIVE 12/11/2016 1010   LEUKOCYTESUR 1+ 03/05/2014 2034   Sepsis Labs Invalid input(s): PROCALCITONIN,  WBC,  LACTICIDVEN Microbiology No results found for this or any previous visit (from the past 240 hour(s)).   Time coordinating discharge: 65mins  SIGNED:   Kathie Dike, MD  [facility] 05/09/2018, 1:35 PM Pager   If 7PM-7AM, please contact night-coverage [email or web address] Password [email or web address]

## 2018-05-09 NOTE — NC FL2 (Signed)
[place] MEDICAID FL2 LEVEL OF CARE SCREENING TOOL     IDENTIFICATION  Patient Name: Sheena [name] Birthdate: 01/08/1924 Sex: female Admission Date (Current Location): 05/07/2018  [facility] and [place] Number:  [employer] and Address:  [place] [address], [place]      Provider Number: [id number]  Attending Physician Name and Address:  Kathie Dike, MD  Relative Name and Phone Number:       Current Level of Care: Hospital Recommended Level of Care: [place] Prior Approval Number:    Date Approved/Denied:   PASRR Number:    Discharge Plan: Other (Comment)(ALF)    Current Diagnoses: Patient Active Problem List   Diagnosis Date Noted  . Acute respiratory failure with hypoxia ([place]) 05/07/2018  . Hypertension   . Diabetes mellitus, type 2 ([place])   . Cholelithiasis 12/11/2016  . Abdominal pain 12/11/2016  . Back pain, thoracic 10/14/2013  . Mild cognitive impairment with memory loss 10/04/2013  . Syncope and collapse 08/26/2013  . Other and unspecified hyperlipidemia 05/14/2013  . Unspecified constipation 05/14/2013  . Osteoarthritis of left hip 01/11/2013  . History of syncope 11/15/2012  . Ankle fracture, left 11/15/2012  . H/O acute renal failure 11/15/2012  . Proximal muscle weakness 10/30/2012  . Neurotic excoriations 09/02/2012  . Loss of balance 06/06/2012  . Melanoma ([place])   . Asthma   . Insomnia, persistent 09/01/2011  . Severe major depression with psychotic features ([place]) 09/01/2011  . Obesity (BMI 30-39.9) 09/01/2011  . Obesity, diabetes, and hypertension syndrome ([place]) 09/01/2011  . COPD with acute exacerbation ([place]) 01/13/2008    Orientation RESPIRATION BLADDER Height & Weight     Self, Time, Place, Situation  Normal Incontinent Weight: 226 lb 9.6 oz (102.8 kg) Height:  5\' 5"  (165.1 cm)  BEHAVIORAL SYMPTOMS/MOOD NEUROLOGICAL BOWEL NUTRITION STATUS      Incontinent Diet(heart  healthy/carb modified (at facility she is on the reduced concentrated sweets diet))  AMBULATORY STATUS COMMUNICATION OF NEEDS Skin   Limited Assist Verbally Other (Comment)(wound arm right anteror; wound arm left lower)                       Personal Care Assistance Level of Assistance  Bathing, Feeding, Dressing Bathing Assistance: Limited assistance Feeding assistance: Independent Dressing Assistance: Limited assistance     Functional Limitations Info  Sight, Speech, Hearing Sight Info: Adequate Hearing Info: Adequate Speech Info: Adequate    SPECIAL CARE FACTORS FREQUENCY  PT (By licensed PT)     PT Frequency: 3x/week              Contractures Contractures Info: Not present    Additional Factors Info  Code Status, Allergies, Psychotropic Code Status Info: full code Allergies Info: NKA Psychotropic Info: xanax, seroquel         Current Medications (05/09/2018):  This is the current hospital active medication list Current Facility-Administered Medications  Medication Dose Route Frequency Provider Last Rate Last Dose  . albuterol (PROVENTIL) (2.5 MG/3ML) 0.083% nebulizer solution 2.5 mg  2.5 mg Nebulization Q2H PRN Truett Mainland, DO      . ALPRAZolam [name]) tablet 1 mg  1 mg Oral Daily Truett Mainland, DO   1 mg at 05/09/18 0900  . atorvastatin (LIPITOR) tablet 40 mg  40 mg Oral QPM Truett Mainland, DO   40 mg at 05/08/18 1645  . doxycycline (VIBRA-TABS) tablet 100 mg  100 mg Oral Q12H  Truett Mainland, DO   100 mg at 05/09/18 0900  . enoxaparin (LOVENOX) injection 50 mg  50 mg Subcutaneous Q24H Truett Mainland, DO   50 mg at 05/08/18 2152  . feeding supplement (GLUCERNA SHAKE) (GLUCERNA SHAKE) liquid 237 mL  237 mL Oral BID BM Kathie Dike, MD      . Influenza vac split quadrivalent PF (FLUZONE HIGH-DOSE) injection 0.5 mL  0.5 mL Intramuscular Tomorrow-1000 Stinson, Jacob J, DO      . insulin aspart (novoLOG) injection 0-20 Units  0-20 Units  Subcutaneous TID WC Truett Mainland, DO   7 Units at 05/09/18 1237  . insulin aspart (novoLOG) injection 0-5 Units  0-5 Units Subcutaneous QHS Truett Mainland, DO   6 Units at 05/08/18 2153  . insulin aspart (novoLOG) injection 5 Units  5 Units Subcutaneous TID WC Kathie Dike, MD   5 Units at 05/09/18 1237  . insulin glargine (LANTUS) injection 25 Units  25 Units Subcutaneous BID Kathie Dike, MD   25 Units at 05/09/18 1236  . ipratropium-albuterol (DUONEB) 0.5-2.5 (3) MG/3ML nebulizer solution 3 mL  3 mL Nebulization TID Kathie Dike, MD   3 mL at 05/09/18 1420  . MEDLINE mouth rinse  15 mL Mouth Rinse BID Truett Mainland, DO   15 mL at 05/09/18 0901  . mometasone-formoterol (DULERA) 200-5 MCG/ACT inhaler 1 puff  1 puff Inhalation BID Truett Mainland, DO   1 puff at 05/09/18 0956  . predniSONE (DELTASONE) tablet 40 mg  40 mg Oral Q breakfast Truett Mainland, DO   40 mg at 05/09/18 0900  . QUEtiapine (SEROQUEL) tablet 25 mg  25 mg Oral BID Truett Mainland, DO   25 mg at 05/09/18 0900  . sertraline (ZOLOFT) tablet 50 mg  50 mg Oral Daily Truett Mainland, DO   50 mg at 05/09/18 0900  . traMADol (ULTRAM) tablet 50 mg  50 mg Oral Q6H PRN Truett Mainland, DO         Discharge Medications: Medication List        TAKE these medications       albuterol (2.5 MG/3ML) 0.083% nebulizer solution Commonly known as:  PROVENTIL Take 3 mLs (2.5 mg total) by nebulization every 6 (six) hours as needed for wheezing or shortness of breath.   ALPRAZolam 1 MG tablet Commonly known as:  XANAX Take 1 mg by mouth daily.   atorvastatin 40 MG tablet Commonly known as:  LIPITOR TAKE ONE TABLET BY MOUTH EVERY DAY. What changed:  when to take this   dextromethorphan-guaiFENesin 30-600 MG 12hr tablet Commonly known as:  MUCINEX DM Take 1 tablet by mouth 2 (two) times daily.   doxycycline 100 MG tablet Commonly known as:  VIBRA-TABS Take 1 tablet (100 mg total) by mouth every 12 (twelve)  hours.   Insulin Glargine 100 UNIT/ML Solostar Pen Commonly known as:  LANTUS Inject 25 Units into the skin 2 (two) times daily. What changed:  See the new instructions.   loratadine 10 MG tablet Commonly known as:  CLARITIN Take 1 tablet (10 mg total) by mouth daily.   mometasone-formoterol 200-5 MCG/ACT Aero Commonly known as:  DULERA Inhale 1 puff into the lungs 2 (two) times daily.   predniSONE 10 MG tablet Commonly known as:  DELTASONE Take 40mg  po daily for 2 days then 30mg  daily for 2 days then 20mg  daily for 2 days then 10mg  daily for 2 days then stop   QUEtiapine 25  MG tablet Commonly known as:  SEROQUEL Take 25 mg by mouth 2 (two) times daily.   sertraline 50 MG tablet Commonly known as:  ZOLOFT Take 50 mg by mouth daily.   tiotropium 18 MCG inhalation capsule Commonly known as:  SPIRIVA Place 1 capsule (18 mcg total) into inhaler and inhale daily.   traMADol 50 MG tablet Commonly known as:  ULTRAM Take 1 tablet (50 mg total) by mouth every 6 (six) hours as needed.   triamcinolone cream 0.1 % Commonly known as:  KENALOG Apply 1 application topically 2 (two) times daily.       Relevant Imaging Results:  Relevant Lab Results:   Additional Information    Sunil Hue, Clydene Pugh, LCSW

## 2018-05-09 NOTE — Progress Notes (Signed)
Patient's IV removed.  Site WNL.  AVS packet given to patient's daughter to give to [facility] at discharge.  Patient transported by NT via w/c to main entrance at discharge.  Patient stable at time of discharge.

## 2018-05-09 NOTE — Progress Notes (Signed)
Pt glucose was 471. MD notified. No new orders.

## 2018-05-09 NOTE — Clinical Social Work Note (Signed)
Family aware of discharge.   FAcility aware of discharge and discharge clinicals sent to facility. FAcility to transport.   LCSW signing off.     Gala Padovano, Clydene Pugh, LCSW

## 2018-05-09 NOTE — Care Management Important Message (Signed)
Important Message  Patient Details  Name: Sheena [name] MRN: [id number] Date of Birth: 1924/06/22   Medicare Important Message Given:  Yes    Shelda Altes 05/09/2018, 11:44 AM

## 2018-05-09 NOTE — Care Management Note (Signed)
Case Management Note  Patient Details  Name: Sheena [name] MRN: [id number] Date of Birth: 04-10-1924  Subjective/Objective:      Admitted with resp failure. Pt from [facility] ALF. Pt's son and DIL at the bedside. CM discussed need for neb machine and HH PT recommendation. Pt has chosen [facility] from HH/DME provider options.              Action/Plan: DC back to ALF today. Vaughan Basta, [facility] rep, given referral and will deliver neb to pt room prior to DC. CSW to make arrangements for return to facility.   Expected Discharge Date:  05/09/18               Expected Discharge Plan:  Assisted Living / Rest Home(with Home Helth)  In-House Referral:  Clinical Social Work  Discharge planning Services  CM Consult  Post Acute Care Choice:  Durable Medical Equipment, Home Health Choice offered to:  Patient, Adult Children  DME Arranged:  Chiropodist DME Agency:  [place]:  RN, PT [facility] Agency:  [place]  Status of Service:  Completed, signed off  Sherald Barge, RN 05/09/2018, 12:58 PM

## 2018-05-11 ENCOUNTER — Observation Stay (HOSPITAL_COMMUNITY)
Admission: EM | Admit: 2018-05-11 | Discharge: 2018-05-13 | Disposition: A | Discharge status: Home / Self Care | Payer: Medicare Other | Attending: Family Medicine | Admitting: Family Medicine

## 2018-05-11 ENCOUNTER — Emergency Department (HOSPITAL_COMMUNITY): Payer: Medicare Other

## 2018-05-11 ENCOUNTER — Encounter (HOSPITAL_COMMUNITY): Payer: Self-pay

## 2018-05-11 ENCOUNTER — Other Ambulatory Visit: Payer: Self-pay

## 2018-05-11 DIAGNOSIS — Z96642 Presence of left artificial hip joint: Secondary | ICD-10-CM | POA: Diagnosis not present

## 2018-05-11 DIAGNOSIS — R55 Syncope and collapse: Principal | ICD-10-CM | POA: Diagnosis present

## 2018-05-11 DIAGNOSIS — G3184 Mild cognitive impairment, so stated: Secondary | ICD-10-CM | POA: Diagnosis not present

## 2018-05-11 DIAGNOSIS — J449 Chronic obstructive pulmonary disease, unspecified: Secondary | ICD-10-CM | POA: Diagnosis not present

## 2018-05-11 DIAGNOSIS — R1031 Right lower quadrant pain: Secondary | ICD-10-CM

## 2018-05-11 DIAGNOSIS — R413 Other amnesia: Secondary | ICD-10-CM | POA: Insufficient documentation

## 2018-05-11 DIAGNOSIS — E119 Type 2 diabetes mellitus without complications: Secondary | ICD-10-CM | POA: Diagnosis not present

## 2018-05-11 DIAGNOSIS — I10 Essential (primary) hypertension: Secondary | ICD-10-CM | POA: Diagnosis not present

## 2018-05-11 DIAGNOSIS — Z85828 Personal history of other malignant neoplasm of skin: Secondary | ICD-10-CM | POA: Insufficient documentation

## 2018-05-11 LAB — HEPATIC FUNCTION PANEL
ALT: 24 U/L (ref 0–44)
AST: 20 U/L (ref 15–41)
Albumin: 3.4 g/dL — ABNORMAL LOW (ref 3.5–5.0)
Alkaline Phosphatase: 91 U/L (ref 38–126)
BILIRUBIN INDIRECT: 0.5 mg/dL (ref 0.3–0.9)
Bilirubin, Direct: 0.1 mg/dL (ref 0.0–0.2)
TOTAL PROTEIN: 8 g/dL (ref 6.5–8.1)
Total Bilirubin: 0.6 mg/dL (ref 0.3–1.2)

## 2018-05-11 LAB — TROPONIN I

## 2018-05-11 LAB — CBC
HEMATOCRIT: 36.3 % (ref 36.0–46.0)
Hemoglobin: 11.9 g/dL — ABNORMAL LOW (ref 12.0–15.0)
MCH: 30.4 pg (ref 26.0–34.0)
MCHC: 32.8 g/dL (ref 30.0–36.0)
MCV: 92.8 fL (ref 78.0–100.0)
Platelets: 213 10*3/uL (ref 150–400)
RBC: 3.91 MIL/uL (ref 3.87–5.11)
RDW: 14.5 % (ref 11.5–15.5)
WBC: 16.7 10*3/uL — AB (ref 4.0–10.5)

## 2018-05-11 LAB — BASIC METABOLIC PANEL
ANION GAP: 9 (ref 5–15)
BUN: 40 mg/dL — ABNORMAL HIGH (ref 8–23)
CALCIUM: 8.6 mg/dL — AB (ref 8.9–10.3)
CO2: 25 mmol/L (ref 22–32)
CREATININE: 1.4 mg/dL — AB (ref 0.44–1.00)
Chloride: 103 mmol/L (ref 98–111)
GFR calc non Af Amer: 32 mL/min — ABNORMAL LOW (ref >60)
GFR, EST AFRICAN AMERICAN: 37 mL/min — AB (ref >60)
Glucose, Bld: 148 mg/dL — ABNORMAL HIGH (ref 70–99)
Potassium: 3.5 mmol/L (ref 3.5–5.1)
SODIUM: 137 mmol/L (ref 135–145)

## 2018-05-11 LAB — URINALYSIS, ROUTINE W REFLEX MICROSCOPIC
BILIRUBIN URINE: NEGATIVE
Glucose, UA: NEGATIVE mg/dL
Hgb urine dipstick: NEGATIVE
KETONES UR: NEGATIVE mg/dL
LEUKOCYTES UA: NEGATIVE
NITRITE: NEGATIVE
PH: 5 (ref 5.0–8.0)
PROTEIN: NEGATIVE mg/dL
Specific Gravity, Urine: 1.042 — ABNORMAL HIGH (ref 1.005–1.030)

## 2018-05-11 LAB — LIPASE, BLOOD: LIPASE: 38 U/L (ref 11–51)

## 2018-05-11 LAB — GLUCOSE, CAPILLARY: GLUCOSE-CAPILLARY: 141 mg/dL — AB (ref 70–99)

## 2018-05-11 LAB — CBG MONITORING, ED: GLUCOSE-CAPILLARY: 146 mg/dL — AB (ref 70–99)

## 2018-05-11 MED ORDER — LORATADINE 10 MG PO TABS
10.0000 mg | ORAL_TABLET | Freq: Every day | ORAL | Status: DC
  Administered 2018-05-11 – 2018-05-13 (×3): 10 mg via ORAL
  Filled 2018-05-11 (×3): qty 1

## 2018-05-11 MED ORDER — INSULIN ASPART 100 UNIT/ML ~~LOC~~ SOLN
0.0000 [IU] | Freq: Every day | SUBCUTANEOUS | Status: DC
  Administered 2018-05-12: 2 [IU] via SUBCUTANEOUS

## 2018-05-11 MED ORDER — ONDANSETRON HCL 4 MG PO TABS: 4.0000 mg | ORAL_TABLET | Freq: Four times a day (QID) | ORAL | Status: DC | PRN

## 2018-05-11 MED ORDER — SODIUM CHLORIDE 0.9% FLUSH: 3.0000 mL | Freq: Two times a day (BID) | INTRAVENOUS | Status: DC

## 2018-05-11 MED ORDER — TIOTROPIUM BROMIDE MONOHYDRATE 18 MCG IN CAPS
18.0000 ug | ORAL_CAPSULE | Freq: Every day | RESPIRATORY_TRACT | Status: DC
  Administered 2018-05-12 – 2018-05-13 (×2): 18 ug via RESPIRATORY_TRACT
  Filled 2018-05-11: qty 5

## 2018-05-11 MED ORDER — TRAMADOL HCL 50 MG PO TABS: 50.0000 mg | ORAL_TABLET | Freq: Four times a day (QID) | ORAL | Status: DC | PRN

## 2018-05-11 MED ORDER — ACETAMINOPHEN 325 MG PO TABS: 650.0000 mg | ORAL_TABLET | Freq: Four times a day (QID) | ORAL | Status: DC | PRN

## 2018-05-11 MED ORDER — SODIUM CHLORIDE 0.9 % IV BOLUS
500.0000 mL | Freq: Once | INTRAVENOUS | Status: AC
  Administered 2018-05-11: 500 mL via INTRAVENOUS

## 2018-05-11 MED ORDER — MOMETASONE FURO-FORMOTEROL FUM 200-5 MCG/ACT IN AERO
INHALATION_SPRAY | RESPIRATORY_TRACT | Status: AC
  Filled 2018-05-11: qty 8.8

## 2018-05-11 MED ORDER — TIOTROPIUM BROMIDE MONOHYDRATE 18 MCG IN CAPS
ORAL_CAPSULE | RESPIRATORY_TRACT | Status: AC
  Filled 2018-05-11: qty 5

## 2018-05-11 MED ORDER — ATORVASTATIN CALCIUM 40 MG PO TABS
40.0000 mg | ORAL_TABLET | Freq: Every evening | ORAL | Status: DC
  Administered 2018-05-11 – 2018-05-12 (×2): 40 mg via ORAL
  Filled 2018-05-11 (×2): qty 1

## 2018-05-11 MED ORDER — ONDANSETRON HCL 4 MG/2ML IJ SOLN: 4.0000 mg | Freq: Four times a day (QID) | INTRAMUSCULAR | Status: DC | PRN

## 2018-05-11 MED ORDER — SERTRALINE HCL 50 MG PO TABS
50.0000 mg | ORAL_TABLET | Freq: Every day | ORAL | Status: DC
  Administered 2018-05-11 – 2018-05-13 (×3): 50 mg via ORAL
  Filled 2018-05-11 (×3): qty 1

## 2018-05-11 MED ORDER — INSULIN GLARGINE 100 UNIT/ML ~~LOC~~ SOLN
25.0000 [IU] | Freq: Two times a day (BID) | SUBCUTANEOUS | Status: DC
  Administered 2018-05-11 – 2018-05-13 (×4): 25 [IU] via SUBCUTANEOUS
  Filled 2018-05-11 (×10): qty 0.25

## 2018-05-11 MED ORDER — DOXYCYCLINE HYCLATE 100 MG PO TABS
100.0000 mg | ORAL_TABLET | Freq: Two times a day (BID) | ORAL | Status: DC
  Administered 2018-05-11 – 2018-05-13 (×4): 100 mg via ORAL
  Filled 2018-05-11 (×4): qty 1

## 2018-05-11 MED ORDER — IOPAMIDOL (ISOVUE-300) INJECTION 61%
75.0000 mL | Freq: Once | INTRAVENOUS | Status: AC | PRN
  Administered 2018-05-11: 75 mL via INTRAVENOUS

## 2018-05-11 MED ORDER — POTASSIUM CHLORIDE 2 MEQ/ML IV SOLN
INTRAVENOUS | Status: DC
  Administered 2018-05-11 – 2018-05-13 (×4): via INTRAVENOUS
  Filled 2018-05-11 (×7): qty 1000

## 2018-05-11 MED ORDER — MOMETASONE FURO-FORMOTEROL FUM 200-5 MCG/ACT IN AERO
1.0000 | INHALATION_SPRAY | Freq: Two times a day (BID) | RESPIRATORY_TRACT | Status: DC
  Administered 2018-05-11 – 2018-05-13 (×4): 1 via RESPIRATORY_TRACT
  Filled 2018-05-11: qty 8.8

## 2018-05-11 MED ORDER — PREDNISONE 20 MG PO TABS
30.0000 mg | ORAL_TABLET | Freq: Every day | ORAL | Status: DC
  Administered 2018-05-12: 20 mg via ORAL
  Filled 2018-05-11: qty 1

## 2018-05-11 MED ORDER — ALPRAZOLAM 0.5 MG PO TABS
0.5000 mg | ORAL_TABLET | Freq: Every day | ORAL | Status: DC
  Administered 2018-05-11 – 2018-05-13 (×4): 0.5 mg via ORAL
  Filled 2018-05-11 (×4): qty 1

## 2018-05-11 MED ORDER — INSULIN ASPART 100 UNIT/ML ~~LOC~~ SOLN
0.0000 [IU] | Freq: Three times a day (TID) | SUBCUTANEOUS | Status: DC
  Administered 2018-05-12 – 2018-05-13 (×3): 4 [IU] via SUBCUTANEOUS

## 2018-05-11 MED ORDER — QUETIAPINE FUMARATE 25 MG PO TABS
25.0000 mg | ORAL_TABLET | Freq: Two times a day (BID) | ORAL | Status: DC
  Administered 2018-05-11 – 2018-05-13 (×4): 25 mg via ORAL
  Filled 2018-05-11 (×4): qty 1

## 2018-05-11 MED ORDER — ALBUTEROL SULFATE (2.5 MG/3ML) 0.083% IN NEBU: 2.5000 mg | INHALATION_SOLUTION | Freq: Four times a day (QID) | RESPIRATORY_TRACT | Status: DC | PRN

## 2018-05-11 MED ORDER — DM-GUAIFENESIN ER 30-600 MG PO TB12
1.0000 | ORAL_TABLET | Freq: Two times a day (BID) | ORAL | Status: DC
  Administered 2018-05-11 – 2018-05-13 (×4): 1 via ORAL
  Filled 2018-05-11 (×4): qty 1

## 2018-05-11 MED ORDER — ACETAMINOPHEN 650 MG RE SUPP: 650.0000 mg | Freq: Four times a day (QID) | RECTAL | Status: DC | PRN

## 2018-05-11 NOTE — H&P (Addendum)
History and Physical    Sheena [name] JSE:[id number] DOB: 11/28/1923 DOA: 05/11/2018  PCP: Lorene Dy, MD  Patient coming from: Home  I have personally briefly reviewed patient's old medical records in [place]  Chief Complaint: Possible syncope  HPI: Sheena [name] is a 84 y.o. female with medical history significant of hypertension, diabetes, who was recently discharged from hospital after being treated for COPD/acute bronchitis.  She was discharged home on antibiotics, nebulizer treatments and steroids.  Patient was reportedly doing well after discharge.  Today, she was sitting in her chair and was noted to be unresponsive.  EMS was called and she was found to be clammy.  Blood pressure was noted to be 80/61 per EMS.  She was mildly hypoxemic and was started on 2 L.  She has not had any worsening of her cough in fact that is doing better.  She has had no chest pain.  She has had no fever.  P.o. intake has been adequate.  ED Course: EKG is unrevealing.  Cardiac enzymes negative.  Labs show mildly elevated BUN to creatinine ratio.  She does have a leukocytosis, likely related to steroids.  CT head did not show any acute findings.  Chest x-ray is also unrevealing.  Review of Systems: As per HPI otherwise 10 point review of systems negative.    Past Medical History:  Diagnosis Date  . Asthma    treated by Dr. Donneta Romberg  . Depression   . Diabetes mellitus, type 2 ([place])   . H/O: hysterectomy 1973   fibroids  . Hypertension   . Left arm pain   . Lumbago   . Melanoma ([place])    left arm, resected, annual CXR's ordered  . Syncope 08/2013   Suspected vagal-mediated    Past Surgical History:  Procedure Laterality Date  . ABDOMINAL HYSTERECTOMY  1973   fibroid uterus  . BREAST BIOPSY  1985   normal  . JOINT REPLACEMENT   2003   Left Hip  . TOTAL HIP ARTHROPLASTY  2003   left, Dr. Durward Fortes  . TRANSTHORACIC ECHOCARDIOGRAM  08/23/13   EF 60-65%, borderline concentric LVH,  impaired LV diastolic relaxation, LA ULN    Social History:  reports that she has never smoked. She has never used smokeless tobacco. She reports that she does not drink alcohol or use drugs.  No Known Allergies  Family History  Problem Relation Age of Onset  . Diabetes Mother   . Heart disease Mother        CHF  . Heart disease Father   . Diabetes Father      Prior to Admission medications   Medication Sig Start Date End Date Taking? Authorizing Provider  ALPRAZolam [name]) 1 MG tablet Take 1 mg by mouth daily.    Yes [provider]  atorvastatin (LIPITOR) 40 MG tablet TAKE ONE TABLET BY MOUTH EVERY DAY. Patient taking differently: Take 40 mg by mouth every evening.  06/12/14  Yes [name], MD  dextromethorphan-guaiFENesin [facility] DM) 30-600 MG 12hr tablet Take 1 tablet by mouth 2 (two) times daily. 05/09/18  Yes Kathie Dike, MD  doxycycline (VIBRA-TABS) 100 MG tablet Take 1 tablet (100 mg total) by mouth every 12 (twelve) hours. 05/09/18  Yes Kathie Dike, MD  Insulin Glargine (LANTUS SOLOSTAR) 100 UNIT/ML Solostar Pen Inject 25 Units into the skin 2 (two) times daily. 05/09/18  Yes Kathie Dike, MD  loratadine (CLARITIN) 10 MG tablet Take 1 tablet (10 mg total)  by mouth daily. 05/09/18 05/09/19 Yes Dantae Meunier, Jolaine Artist, MD  mometasone-formoterol (DULERA) 200-5 MCG/ACT AERO Inhale 1 puff into the lungs 2 (two) times daily. 05/09/18  Yes Kathie Dike, MD  predniSONE (DELTASONE) 10 MG tablet Take 40mg  po daily for 2 days then 30mg  daily for 2 days then 20mg  daily for 2 days then 10mg  daily for 2 days then stop 05/09/18  Yes Kathie Dike, MD  QUEtiapine (SEROQUEL) 25 MG tablet Take 25 mg by mouth 2 (two) times daily.   Yes [provider]  sertraline (ZOLOFT) 50 MG tablet Take 50 mg by mouth daily.   Yes [provider]  tiotropium (SPIRIVA HANDIHALER) 18 MCG inhalation capsule Place 1 capsule (18 mcg total) into inhaler and inhale daily. 05/09/18  05/09/19 Yes Kathie Dike, MD  traMADol (ULTRAM) 50 MG tablet Take 1 tablet (50 mg total) by mouth every 6 (six) hours as needed. 06/29/15  Yes [name], MD  triamcinolone cream (KENALOG) 0.1 % Apply 1 application topically 2 (two) times daily.   Yes [provider]  albuterol (PROVENTIL) (2.5 MG/3ML) 0.083% nebulizer solution Take 3 mLs (2.5 mg total) by nebulization every 6 (six) hours as needed for wheezing or shortness of breath. Patient not taking: Reported on 05/11/2018 05/09/18   Kathie Dike, MD    Physical Exam: Vitals:   05/11/18 1340 05/11/18 1509 05/11/18 1512 05/11/18 1515  BP: (!) 168/53 (!) 169/53 (!) 170/67 (!) 151/74  Pulse: 60 60 63 77  Resp: 18     Temp: (!) 97.4 F (36.3 C)     TempSrc: Oral     SpO2: 100%     Weight: 101.8 kg     Height: 5\' 5"  (1.651 m)       Constitutional: NAD, calm, comfortable Eyes: PERRL, lids and conjunctivae normal ENMT: Mucous membranes are moist. Posterior pharynx clear of any exudate or lesions.Normal dentition.  Neck: normal, supple, no masses, no thyromegaly Respiratory: clear to auscultation bilaterally, no wheezing, no crackles. Normal respiratory effort. No accessory muscle use.  Cardiovascular: Regular rate and rhythm, no murmurs / rubs / gallops. No extremity edema. 2+ pedal pulses. No carotid bruits.  Abdomen: no tenderness, no masses palpated. No hepatosplenomegaly. Bowel sounds positive.  Musculoskeletal: no clubbing / cyanosis. No joint deformity upper and lower extremities. Good ROM, no contractures. Normal muscle tone.  Skin: no rashes, lesions, ulcers. No induration Neurologic: CN 2-12 grossly intact. Sensation intact, DTR normal. Strength 5/5 in all 4.  Psychiatric: Confused, pleasant   Labs on Admission: I have personally reviewed following labs and imaging studies  CBC: Recent Labs  Lab 05/07/18 1351 05/11/18 0837  WBC 7.7 16.7*  NEUTROABS 5.0  --   HGB 12.8 11.9*  HCT 38.8 36.3  MCV 92.4  92.8  PLT 228 956   Basic Metabolic Panel: Recent Labs  Lab 05/07/18 1351 05/11/18 0841  NA 132* 137  K 3.7 3.5  CL 99 103  CO2 25 25  GLUCOSE 209* 148*  BUN 23 40*  CREATININE 1.30* 1.40*  CALCIUM 8.2* 8.6*   GFR: Estimated Creatinine Clearance: 30.3 mL/min (A) (by C-G formula based on SCr of 1.4 mg/dL (H)). Liver Function Tests: Recent Labs  Lab 05/07/18 1351 05/11/18 0841  AST 36 20  ALT 31 24  ALKPHOS 111 91  BILITOT 0.7 0.6  PROT 8.6* 8.0  ALBUMIN 3.4* 3.4*   Recent Labs  Lab 05/11/18 0841  LIPASE 38   No results for input(s): AMMONIA in the last 168  hours. Coagulation Profile: No results for input(s): INR, PROTIME in the last 168 hours. Cardiac Enzymes: Recent Labs  Lab 05/11/18 0841  TROPONINI <0.03   BNP (last 3 results) No results for input(s): PROBNP in the last 8760 hours. HbA1C: No results for input(s): HGBA1C in the last 72 hours. CBG: Recent Labs  Lab 05/08/18 2134 05/08/18 2241 05/09/18 0740 05/09/18 1133 05/11/18 0823  GLUCAP 429* 471* 256* 202* 146*   Lipid Profile: No results for input(s): CHOL, HDL, LDLCALC, TRIG, CHOLHDL, LDLDIRECT in the last 72 hours. Thyroid Function Tests: No results for input(s): TSH, T4TOTAL, FREET4, T3FREE, THYROIDAB in the last 72 hours. Anemia Panel: No results for input(s): VITAMINB12, FOLATE, FERRITIN, TIBC, IRON, RETICCTPCT in the last 72 hours. Urine analysis:    Component Value Date/Time   COLORURINE YELLOW 05/11/2018 0820   APPEARANCEUR CLEAR 05/11/2018 0820   APPEARANCEUR Clear 03/05/2014 2034   LABSPEC 1.042 (H) 05/11/2018 0820   LABSPEC 1.010 03/05/2014 2034   PHURINE 5.0 05/11/2018 0820   GLUCOSEU NEGATIVE 05/11/2018 0820   GLUCOSEU Negative 03/05/2014 2034   HGBUR NEGATIVE 05/11/2018 0820   BILIRUBINUR NEGATIVE 05/11/2018 0820   BILIRUBINUR Negative 03/05/2014 2034   KETONESUR NEGATIVE 05/11/2018 0820   PROTEINUR NEGATIVE 05/11/2018 0820   UROBILINOGEN 0.2 06/13/2013 1148    NITRITE NEGATIVE 05/11/2018 0820   LEUKOCYTESUR NEGATIVE 05/11/2018 0820   LEUKOCYTESUR 1+ 03/05/2014 2034    Radiological Exams on Admission: Dg Chest 2 View  Result Date: 05/11/2018 CLINICAL DATA:  Syncope EXAM: CHEST - 2 VIEW COMPARISON:  05/07/2018 FINDINGS: Cardiomegaly. Peripheral density at the left base likely reflects scarring, stable dating back to 2017. No confluent opacity on the right. No effusions or edema. No acute bony abnormality. IMPRESSION: Cardiomegaly.  Left basilar scarring.  No active disease. Electronically Signed   By: Rolm Baptise M.D.   On: 05/11/2018 09:59   Ct Head Wo Contrast  Result Date: 05/11/2018 CLINICAL DATA:  Syncope. EXAM: CT HEAD WITHOUT CONTRAST TECHNIQUE: Contiguous axial images were obtained from the base of the skull through the vertex without intravenous contrast. COMPARISON:  CT scan of February 19, 2017. FINDINGS: Brain: Mild diffuse cortical atrophy is noted. Mild chronic ischemic white matter disease is noted. No mass effect or midline shift is noted. Ventricular size is within normal limits. There is no evidence of mass lesion, hemorrhage or acute infarction. Vascular: No hyperdense vessel or unexpected calcification. Skull: Normal. Negative for fracture or focal lesion. Sinuses/Orbits: No acute finding. Other: None. IMPRESSION: Mild diffuse cortical atrophy. Mild chronic ischemic white matter disease. No acute intracranial abnormality seen. Electronically Signed   By: Marijo Conception, M.D.   On: 05/11/2018 10:27   Ct Abdomen Pelvis W Contrast  Result Date: 05/11/2018 CLINICAL DATA:  Left-sided abdominal pain and recent syncopal episode EXAM: CT ABDOMEN AND PELVIS WITH CONTRAST TECHNIQUE: Multidetector CT imaging of the abdomen and pelvis was performed using the standard protocol following bolus administration of intravenous contrast. CONTRAST:  58mL ISOVUE-300 IOPAMIDOL (ISOVUE-300) INJECTION 61% COMPARISON:  12/11/2016 FINDINGS: Lower chest: Mild  scarring is noted in the bases bilaterally. No focal infiltrate or sizable effusion is seen. Hepatobiliary: Gallstones are again identified without complicating factors. The liver is mildly decreased in attenuation consistent with fatty infiltration. Pancreas: Unremarkable. No pancreatic ductal dilatation or surrounding inflammatory changes. Spleen: Normal in size without focal abnormality. Adrenals/Urinary Tract: Adrenal glands are within normal limits. No renal calculi or obstructive changes are noted. Stable left renal cyst is noted. Bladder is partially decompressed.  Stomach/Bowel: Small hiatal hernia is noted. Scattered diverticular changes noted without evidence of diverticulitis. No obstructive or inflammatory changes of the bowel are seen. The appendix is not well visualized although no inflammatory changes are noted. Vascular/Lymphatic: Aortic atherosclerosis. No enlarged abdominal or pelvic lymph nodes. Reproductive: Status post hysterectomy. No adnexal masses. Other: No free fluid is noted. There is thickening of the rectus muscle on the right consistent with a spontaneous rectus hematoma. Correlate with any blood thinner administration or recent trauma. No definitive active extravasation is noted at this time. Musculoskeletal: Left hip replacement is noted. Degenerative changes of the lumbar spine are seen. IMPRESSION: Changes consistent with right rectus muscular hematoma. No definitive active extravasation is noted at this time. Correlate with blood thinner administration or recent trauma. Stable cholelithiasis. Stable hiatal hernia. Diverticulosis without diverticulitis. Electronically Signed   By: Inez Catalina M.D.   On: 05/11/2018 10:32    EKG: Independently reviewed.  Sinus rhythm without any acute changes  Assessment/Plan Principal Problem:   Syncope Active Problems:   Mild cognitive impairment with memory loss   Hypertension   Diabetes mellitus, type 2 ([facility])   COPD (chronic  obstructive pulmonary disease) ([place])     1. Syncope.  Likely vasovagal in origin.  I have an element of dehydration.  Start on IV fluids.  Check orthostatics.  Check echocardiogram.  Gentle hydration.  Cycle cardiac enzymes. 2. Diabetes.  Continue to monitor.  Start sliding scale insulin.  Hold oral agents. 3. COPD/bronchitis.  No wheezing at this time.  Continue as needed nebulizer treatments.  Will continue on steroid course as well as antibiotic course. 4. Hypertension.  Currently stable.  Continue to monitor blood pressure. 5. Right lower quadrant abdominal pain.  CT abdomen shows right rectus muscular hematoma.  Likely related to Lovenox injection she received during her last hospitalization.  DVT prophylaxis: SCDs Code Status: DNR Family Communication: Discussed with daughter and son-in-law at the bedside Disposition Plan: Return to assisted living on discharge Consults called:   Admission status: Observation, telemetry  Kathie Dike MD [facility] Pager [phone]  If 7PM-7AM, please contact night-coverage [email or web address] Password [facility]  05/11/2018, 5:57 PM

## 2018-05-11 NOTE — ED Notes (Signed)
Pt sats 86%. Pt placed on 2L.

## 2018-05-11 NOTE — ED Notes (Signed)
Phlebotomy at bedside.

## 2018-05-11 NOTE — ED Provider Notes (Signed)
Emergency Department Provider Note   I have reviewed the triage vital signs and the nursing notes.   HISTORY  Chief Complaint Loss of Consciousness   HPI Sheena [name] is a 84 y.o. female with PMH of asthma, DM, HTN, COPD, and cognative impairment presents to the emergency department for evaluation after a syncopal event.  The patient is currently at [facility] after recent admission for respiratory distress.  She is currently taking steroid, Mucinex, doxycycline.  Staff report the patient was sitting in a chair with her head hanging down.  She was unresponsive and was making occasional jerking type movements.  They found her to be clammy and EMS reported a blood pressure on scene of 80/61.  She had mild hypoxemia and was started on 2 L nasal cannula.   Patient does not recall the events of this morning and cannot tell me if she was feeling badly.  She did complained to nursing staff of some abdominal pain but could not provide additional history.  Denies symptoms at this time.  Level 5 caveat: AMS  Past Medical History:  Diagnosis Date  . Asthma    treated by Dr. Donneta Romberg  . Depression   . Diabetes mellitus, type 2 ([place])   . H/O: hysterectomy 1973   fibroids  . Hypertension   . Left arm pain   . Lumbago   . Melanoma ([place])    left arm, resected, annual CXR's ordered  . Syncope 08/2013   Suspected vagal-mediated    Patient Active Problem List   Diagnosis Date Noted  . Syncope 05/11/2018  . Acute respiratory failure with hypoxia ([place]) 05/07/2018  . Hypertension   . Diabetes mellitus, type 2 ([place])   . Cholelithiasis 12/11/2016  . Abdominal pain 12/11/2016  . Back pain, thoracic 10/14/2013  . Mild cognitive impairment with memory loss 10/04/2013  . Syncope and collapse 08/26/2013  . Other and unspecified hyperlipidemia 05/14/2013  . Unspecified constipation 05/14/2013  . Osteoarthritis of left hip 01/11/2013  . History of syncope 11/15/2012  . Ankle fracture,  left 11/15/2012  . H/O acute renal failure 11/15/2012  . Proximal muscle weakness 10/30/2012  . Neurotic excoriations 09/02/2012  . Loss of balance 06/06/2012  . Melanoma ([place])   . Asthma   . Insomnia, persistent 09/01/2011  . Severe major depression with psychotic features ([place]) 09/01/2011  . Obesity (BMI 30-39.9) 09/01/2011  . Obesity, diabetes, and hypertension syndrome ([place]) 09/01/2011  . COPD with acute exacerbation ([place]) 01/13/2008    Past Surgical History:  Procedure Laterality Date  . ABDOMINAL HYSTERECTOMY  1973   fibroid uterus  . BREAST BIOPSY  1985   normal  . JOINT REPLACEMENT   2003   Left Hip  . TOTAL HIP ARTHROPLASTY  2003   left, Dr. Durward Fortes  . TRANSTHORACIC ECHOCARDIOGRAM  08/23/13   EF 60-65%, borderline concentric LVH, impaired LV diastolic relaxation, LA ULN   Allergies Patient has no known allergies.  Family History  Problem Relation Age of Onset  . Diabetes Mother   . Heart disease Mother        CHF  . Heart disease Father   . Diabetes Father     Social History Social History   Tobacco Use  . Smoking status: Never Smoker  . Smokeless tobacco: Never Used  Substance Use Topics  . Alcohol use: No    Comment: occasional  . Drug use: No    Review of Systems  Level 5 caveat: AMS  ____________________________________________   PHYSICAL EXAM:  VITAL SIGNS: ED Triage Vitals  Enc Vitals Group     BP 05/11/18 0815 (!) 121/47     Pulse Rate 05/11/18 0815 62     Resp 05/11/18 0815 11     Temp 05/11/18 0817 97.6 F (36.4 C)     Temp Source 05/11/18 0817 Oral     SpO2 05/11/18 0813 95 %     Weight 05/11/18 0816 226 lb 9.4 oz (102.8 kg)   Constitutional: Alert but confused with limited verbal effort.  Eyes: Conjunctivae are normal. PERRL. Head: Atraumatic. Nose: No congestion/rhinnorhea. Mouth/Throat: Mucous membranes are moist.  Neck: No stridor.  Cardiovascular: Normal rate, regular rhythm. Good peripheral circulation. Grossly  normal heart sounds.   Respiratory: Normal respiratory effort.  No retractions. Lungs CTAB. Gastrointestinal: Soft with focal RLQ tenderness with voluntary guarding. No distention.  Musculoskeletal: No lower extremity tenderness nor edema. No gross deformities of extremities. Neurologic: Somewhat hesitant speech. No gross focal neurologic deficits are appreciated.  Skin:  Skin is warm, dry and intact. No rash noted.  ____________________________________________   LABS (all labs ordered are listed, but only abnormal results are displayed)  Labs Reviewed  CBC - Abnormal; Notable for the following components:      Result Value   WBC 16.7 (*)    Hemoglobin 11.9 (*)    All other components within normal limits  URINALYSIS, ROUTINE W REFLEX MICROSCOPIC - Abnormal; Notable for the following components:   Specific Gravity, Urine 1.042 (*)    All other components within normal limits  HEPATIC FUNCTION PANEL - Abnormal; Notable for the following components:   Albumin 3.4 (*)    All other components within normal limits  BASIC METABOLIC PANEL - Abnormal; Notable for the following components:   Glucose, Bld 148 (*)    BUN 40 (*)    Creatinine, Ser 1.40 (*)    Calcium 8.6 (*)    GFR calc non Af Amer 32 (*)    GFR calc Af Amer 37 (*)    All other components within normal limits  CBG MONITORING, ED - Abnormal; Notable for the following components:   Glucose-Capillary 146 (*)    All other components within normal limits  TROPONIN I  LIPASE, BLOOD   ____________________________________________  EKG   EKG Interpretation  Date/Time:  Wednesday May 11 2018 08:15:32 EDT Ventricular Rate:  62 PR Interval:    QRS Duration: 88 QT Interval:  442 QTC Calculation: 449 R Axis:   -13 Text Interpretation:  Sinus rhythm Abnormal R-wave progression, early transition Left ventricular hypertrophy Baseline wander in lead(s) V1 No STEMI. Similar to May 2018 tracing.  Confirmed by Nanda Quinton  [phone]) on 05/11/2018 8:20:39 AM       ____________________________________________  RADIOLOGY  Dg Chest 2 View  Result Date: 05/11/2018 CLINICAL DATA:  Syncope EXAM: CHEST - 2 VIEW COMPARISON:  05/07/2018 FINDINGS: Cardiomegaly. Peripheral density at the left base likely reflects scarring, stable dating back to 2017. No confluent opacity on the right. No effusions or edema. No acute bony abnormality. IMPRESSION: Cardiomegaly.  Left basilar scarring.  No active disease. Electronically Signed   By: Rolm Baptise M.D.   On: 05/11/2018 09:59   Ct Head Wo Contrast  Result Date: 05/11/2018 CLINICAL DATA:  Syncope. EXAM: CT HEAD WITHOUT CONTRAST TECHNIQUE: Contiguous axial images were obtained from the base of the skull through the vertex without intravenous contrast. COMPARISON:  CT scan of February 19, 2017. FINDINGS: Brain: Mild diffuse  cortical atrophy is noted. Mild chronic ischemic white matter disease is noted. No mass effect or midline shift is noted. Ventricular size is within normal limits. There is no evidence of mass lesion, hemorrhage or acute infarction. Vascular: No hyperdense vessel or unexpected calcification. Skull: Normal. Negative for fracture or focal lesion. Sinuses/Orbits: No acute finding. Other: None. IMPRESSION: Mild diffuse cortical atrophy. Mild chronic ischemic white matter disease. No acute intracranial abnormality seen. Electronically Signed   By: Marijo Conception, M.D.   On: 05/11/2018 10:27   Ct Abdomen Pelvis W Contrast  Result Date: 05/11/2018 CLINICAL DATA:  Left-sided abdominal pain and recent syncopal episode EXAM: CT ABDOMEN AND PELVIS WITH CONTRAST TECHNIQUE: Multidetector CT imaging of the abdomen and pelvis was performed using the standard protocol following bolus administration of intravenous contrast. CONTRAST:  33mL ISOVUE-300 IOPAMIDOL (ISOVUE-300) INJECTION 61% COMPARISON:  12/11/2016 FINDINGS: Lower chest: Mild scarring is noted in the bases bilaterally. No focal  infiltrate or sizable effusion is seen. Hepatobiliary: Gallstones are again identified without complicating factors. The liver is mildly decreased in attenuation consistent with fatty infiltration. Pancreas: Unremarkable. No pancreatic ductal dilatation or surrounding inflammatory changes. Spleen: Normal in size without focal abnormality. Adrenals/Urinary Tract: Adrenal glands are within normal limits. No renal calculi or obstructive changes are noted. Stable left renal cyst is noted. Bladder is partially decompressed. Stomach/Bowel: Small hiatal hernia is noted. Scattered diverticular changes noted without evidence of diverticulitis. No obstructive or inflammatory changes of the bowel are seen. The appendix is not well visualized although no inflammatory changes are noted. Vascular/Lymphatic: Aortic atherosclerosis. No enlarged abdominal or pelvic lymph nodes. Reproductive: Status post hysterectomy. No adnexal masses. Other: No free fluid is noted. There is thickening of the rectus muscle on the right consistent with a spontaneous rectus hematoma. Correlate with any blood thinner administration or recent trauma. No definitive active extravasation is noted at this time. Musculoskeletal: Left hip replacement is noted. Degenerative changes of the lumbar spine are seen. IMPRESSION: Changes consistent with right rectus muscular hematoma. No definitive active extravasation is noted at this time. Correlate with blood thinner administration or recent trauma. Stable cholelithiasis. Stable hiatal hernia. Diverticulosis without diverticulitis. Electronically Signed   By: Inez Catalina M.D.   On: 05/11/2018 10:32    ____________________________________________   PROCEDURES  Procedure(s) performed:   Procedures  None  ____________________________________________   INITIAL IMPRESSION / ASSESSMENT AND PLAN / ED COURSE  Pertinent labs & imaging results that were available during my care of the patient were  reviewed by me and considered in my medical decision making (see chart for details).  Patient presents to the emergency department for evaluation after an apparent syncopal event.  She was hypotensive on scene but blood pressure is normal here.  Patient has some limited verbal abilities and confusion which according to EMS is near her baseline.  Recently discharged with respiratory distress 2/2 COPD and discharged home with prednisone taper and Doxycycline. CBG normal in the ED. No hypotension here. Patient on 2L [place]. Patient does have focal RLQ abdominal tenderness. Plan for CT abdomen/pelvis, CXR, CT head, and labs. Patient does not appear to be in any acute respiratory distress at this time.   CXR negative. CT head with no acute findings. Patient with right rectus hematoma without evidence of active extravasation. Suspect this is the source of pain. Patient with leukocytosis but no clear infection source. Plan for obs admit and monitoring for syncope.   Discussed patient's case with Hospitalist to request admission. Patient and  family (if present) updated with plan. Care transferred to Hospitalist service.  I reviewed all nursing notes, vitals, pertinent old records, EKGs, labs, imaging (as available).  ____________________________________________  FINAL CLINICAL IMPRESSION(S) / ED DIAGNOSES  Final diagnoses:  Syncope, unspecified syncope type  RLQ abdominal pain    MEDICATIONS GIVEN DURING THIS VISIT:  Medications  sodium chloride 0.9 % bolus 500 mL ( Intravenous Stopped 05/11/18 0940)  iopamidol (ISOVUE-300) 61 % injection 75 mL (75 mLs Intravenous Contrast Given 05/11/18 1006)    Note:  This document was prepared using Dragon voice recognition software and may include unintentional dictation errors.  Nanda Quinton, MD Emergency Medicine    Koralyn Prestage, [facility], MD 05/11/18 1259

## 2018-05-11 NOTE — ED Notes (Signed)
EDP at bedside  

## 2018-05-11 NOTE — ED Triage Notes (Addendum)
Pt brought in from [facility]. Staff reported to EMS that pt had a syncopal episode with jerking like movement. When EMS arrived pt sitting in chair with head hanging down. Clammy and initial BP 80/61. Sats 91 % on room air.  o2 initiated at 2 L via NCPt not as verbal as normal. Pt noted to have a cough and is on doxy, mucinex and prednisone CBG 140

## 2018-05-12 ENCOUNTER — Observation Stay (HOSPITAL_BASED_OUTPATIENT_CLINIC_OR_DEPARTMENT_OTHER): Payer: Medicare Other

## 2018-05-12 DIAGNOSIS — I5030 Unspecified diastolic (congestive) heart failure: Secondary | ICD-10-CM

## 2018-05-12 DIAGNOSIS — R55 Syncope and collapse: Secondary | ICD-10-CM

## 2018-05-12 DIAGNOSIS — I10 Essential (primary) hypertension: Secondary | ICD-10-CM

## 2018-05-12 DIAGNOSIS — Z794 Long term (current) use of insulin: Secondary | ICD-10-CM

## 2018-05-12 DIAGNOSIS — E119 Type 2 diabetes mellitus without complications: Secondary | ICD-10-CM

## 2018-05-12 DIAGNOSIS — J449 Chronic obstructive pulmonary disease, unspecified: Secondary | ICD-10-CM | POA: Diagnosis not present

## 2018-05-12 LAB — ECHOCARDIOGRAM COMPLETE
HEIGHTINCHES: 65 in
WEIGHTICAEL: 3664.93 [oz_av]

## 2018-05-12 LAB — CBC
HEMATOCRIT: 32 % — AB (ref 36.0–46.0)
Hemoglobin: 10.4 g/dL — ABNORMAL LOW (ref 12.0–15.0)
MCH: 29.9 pg (ref 26.0–34.0)
MCHC: 32.5 g/dL (ref 30.0–36.0)
MCV: 92 fL (ref 78.0–100.0)
PLATELETS: 176 10*3/uL (ref 150–400)
RBC: 3.48 MIL/uL — ABNORMAL LOW (ref 3.87–5.11)
RDW: 14.5 % (ref 11.5–15.5)
WBC: 11 10*3/uL — ABNORMAL HIGH (ref 4.0–10.5)

## 2018-05-12 LAB — GLUCOSE, CAPILLARY
Glucose-Capillary: 102 mg/dL — ABNORMAL HIGH (ref 70–99)
Glucose-Capillary: 198 mg/dL — ABNORMAL HIGH (ref 70–99)
Glucose-Capillary: 174 mg/dL — ABNORMAL HIGH (ref 70–99)
Glucose-Capillary: 202 mg/dL — ABNORMAL HIGH (ref 70–99)

## 2018-05-12 LAB — TROPONIN I: Troponin I: <0.03 ng/mL (ref <0.03)

## 2018-05-12 LAB — BASIC METABOLIC PANEL
Anion gap: 7 (ref 5–15)
BUN: 32 mg/dL — AB (ref 8–23)
CHLORIDE: 105 mmol/L (ref 98–111)
CO2: 23 mmol/L (ref 22–32)
CREATININE: 1.08 mg/dL — AB (ref 0.44–1.00)
Calcium: 7.9 mg/dL — ABNORMAL LOW (ref 8.9–10.3)
GFR calc Af Amer: 50 mL/min — ABNORMAL LOW (ref >60)
GFR calc non Af Amer: 43 mL/min — ABNORMAL LOW (ref >60)
GLUCOSE: 121 mg/dL — AB (ref 70–99)
POTASSIUM: 3.7 mmol/L (ref 3.5–5.1)
SODIUM: 135 mmol/L (ref 135–145)

## 2018-05-12 NOTE — Evaluation (Signed)
Physical Therapy Evaluation Patient Details Name: Sheena [name] MRN: [id number] DOB: 04-Apr-1924 Today's Date: 05/12/2018   History of Present Illness  Sheena [name] is a 84 y.o. female with medical history significant of hypertension, diabetes, who was recently discharged from hospital after being treated for COPD/acute bronchitis.  She was discharged home on antibiotics, nebulizer treatments and steroids.  Patient was reportedly doing well after discharge.  Today, she was sitting in her chair and was noted to be unresponsive.  EMS was called and she was found to be clammy.  Blood pressure was noted to be 80/61 per EMS.  She was mildly hypoxemic and was started on 2 L.  She has not had any worsening of her cough in fact that is doing better.  She has had no chest pain.  She has had no fever.  P.o. intake has been adequate.    Clinical Impression  Patient functioning near baseline for functional mobility and gait, but limited mostly due to fatigue and mild SOB, requires frequent input get feedback from patient concerning her level of fatigue during gait training, tends to state she feels OK even when appearing visibly fatigued resulting increased assistance to make it back to bedside.  Patient on room air throughout treatment with O2 saturation at 93% and left on room air after therapy - RN notified.  Patient tolerated sitting up in chair with family members present at bedside after therapy.  Patient will benefit from continued physical therapy in hospital and recommended venue below to increase strength, balance, endurance for safe ADLs and gait.    Follow Up Recommendations SNF;Supervision for mobility/OOB;Supervision/Assistance - 24 hour    Equipment Recommendations  None recommended by PT    Recommendations for Other Services       Precautions / Restrictions Restrictions Weight Bearing Restrictions: No      Mobility  Bed Mobility Overal bed mobility: Needs Assistance Bed Mobility:  Supine to Sit     Supine to sit: Min assist     General bed mobility comments: slow labored movement with requiring repeated verbal/tactile cueing to scoot to bedside  Transfers Overall transfer level: Needs assistance Equipment used: Rolling walker (2 wheeled) Transfers: Sit to/from [facility] Sit to Stand: Min assist Stand pivot transfers: Min guard       General transfer comment: labored movement  Ambulation/Gait Ambulation/Gait assistance: Min guard Gait Distance (Feet): 35 Feet Assistive device: Rolling walker (2 wheeled) Gait Pattern/deviations: Decreased step length - left;Decreased step length - right;Decreased stride length Gait velocity: decreased   General Gait Details: slightly labored cadence without loss of balance, once fatigued becomes SOB, requiring more assistance for safety, on room air with O2 saturation 93% after ambulation  Stairs            Wheelchair Mobility    Modified Rankin (Stroke Patients Only)       Balance Overall balance assessment: Needs assistance Sitting-balance support: Feet supported;No upper extremity supported Sitting balance-Leahy Scale: Good     Standing balance support: During functional activity;Bilateral upper extremity supported Standing balance-Leahy Scale: Fair Standing balance comment: using RW                             Pertinent Vitals/Pain Pain Assessment: No/denies pain    Home Living Family/patient expects to be discharged to:: Assisted living               Home Equipment: Walker - 2 wheels  Prior Function Level of Independence: Needs assistance   Gait / Transfers Assistance Needed: household ambulator with RW (per patient)  ADL's / Homemaking Assistance Needed: assisted by ALF staff        Hand Dominance        Extremity/Trunk Assessment   Upper Extremity Assessment Upper Extremity Assessment: Generalized weakness    Lower Extremity  Assessment Lower Extremity Assessment: Generalized weakness    Cervical / Trunk Assessment Cervical / Trunk Assessment: Normal  Communication   Communication: No difficulties  Cognition Arousal/Alertness: Awake/alert Behavior During Therapy: WFL for tasks assessed/performed Overall Cognitive Status: Within Functional Limits for tasks assessed                                        General Comments      Exercises     Assessment/Plan    PT Assessment Patient needs continued PT services  PT Problem List Decreased strength;Decreased balance;Decreased activity tolerance;Decreased mobility       PT Treatment Interventions Gait training;Stair training;Functional mobility training;Therapeutic activities;Therapeutic exercise;Patient/family education    PT Goals (Current goals can be found in the Care Plan section)  Acute Rehab PT Goals Patient Stated Goal: return home PT Goal Formulation: With patient Time For Goal Achievement: 05/26/18 Potential to Achieve Goals: Good    Frequency Min 3X/week   Barriers to discharge        Co-evaluation               AM-PAC PT "6 Clicks" Daily Activity  Outcome Measure Difficulty turning over in bed (including adjusting bedclothes, sheets and blankets)?: A Little Difficulty moving from lying on back to sitting on the side of the bed? : A Little Difficulty sitting down on and standing up from a chair with arms (e.g., wheelchair, bedside commode, etc,.)?: A Little Help needed moving to and from a bed to chair (including a wheelchair)?: A Little Help needed walking in hospital room?: A Little Help needed climbing 3-5 steps with a railing? : A Lot 6 Click Score: 17    End of Session Equipment Utilized During Treatment: Gait belt Activity Tolerance: Patient tolerated treatment well;Patient limited by fatigue Patient left: in chair;with call bell/phone within reach;with chair alarm set;with family/visitor  present Nurse Communication: Mobility status PT Visit Diagnosis: Unsteadiness on feet (R26.81);Other abnormalities of gait and mobility (R26.89);Muscle weakness (generalized) (M62.81)    Time: 5056-9794 PT Time Calculation (min) (ACUTE ONLY): 33 min   Charges:   PT Evaluation $PT Eval Moderate Complexity: 1 Mod PT Treatments $Therapeutic Activity: 23-37 mins        2:09 PM, 05/12/18 Lonell Grandchild, MPT Physical Therapist with [facility] [phone] office [phone] mobile phone

## 2018-05-12 NOTE — Care Management (Addendum)
Patient from [facility], CM contacted POA Preson Bowmen to discuss discuss MOON. Pt active with [facility] pta. [name] requests CSW contact him for DC planning discussion.

## 2018-05-12 NOTE — Progress Notes (Addendum)
PROGRESS NOTE  Sheena [name]  HUD:[id number]  DOB: 23-Jan-1924  DOA: 05/11/2018 PCP: Lorene Dy, MD  Brief Admission Hx: Sheena [name] is a 84 y.o. female with medical history significant of hypertension, diabetes, who was recently discharged from hospital after being treated for COPD/acute bronchitis.  She was discharged home on antibiotics, nebulizer treatments and steroids.  Patient was reportedly doing well after discharge.  Today, she was sitting in her chair and was noted to be unresponsive.  EMS was called and she was found to be clammy.  Blood pressure was noted to be 80/61 per EMS.  She was mildly hypoxemic and was started on 2 L.  She has not had any worsening of her cough in fact that is doing better.  She has had no chest pain.  She has had no fever.  P.O.  intake has been adequate.  MDM/Assessment & Plan: 1. Syncope.  Likely vasovagal in origin.  Suspect that patient was dehydrated and is improving with IVFs.  Check orthostatics.  Check echocardiogram.    Troponin negative x 3.  PT eval recommending SNF.  CSW has been consulted.  2. Diabetes, type 2 -   Continue to monitor.  Start sliding scale insulin.  Hold oral agents. 3. COPD/bronchitis.  No wheezing at this time.  Continue as needed nebulizer treatments.  Will continue on steroid course as well as antibiotic course. 4. Hypertension.  Currently stable.  Continue to monitor blood pressure. 5. Right lower quadrant abdominal pain.  CT abdomen shows right rectus muscular hematoma.  Likely related to Lovenox injection she received during her last hospitalization.  DVT prophylaxis: SCDs Code Status: DNR Family Communication: Discussed with daughter and son-in-law at the bedside Disposition Plan: SNF recommended  Subjective: Pt says she does feel a little better today and she is willing to work with PT.    Objective: Vitals:   05/11/18 2146 05/12/18 0517 05/12/18 0935 05/12/18 1448  BP: (!) 173/72 (!) 163/83  (!)  156/70  Pulse: 80 69  79  Resp:  18  20  Temp:  98 F (36.7 C)  (!) 97.3 F (36.3 C)  TempSrc:  Oral    SpO2: 95% 98% 91% 93%  Weight:  103.9 kg    Height:        Intake/Output Summary (Last 24 hours) at 05/12/2018 1516 Last data filed at 05/12/2018 1442 Gross per 24 hour  Intake 1901.83 ml  Output 250 ml  Net 1651.83 ml   Filed Weights   05/11/18 0816 05/11/18 1340 05/12/18 0517  Weight: 102.8 kg 101.8 kg 103.9 kg   REVIEW OF SYSTEMS  As per history otherwise all reviewed and reported negative  Exam:  General exam: awake, alert, NAD, cooperative.  Respiratory system:  No increased work of breathing. Cardiovascular system: normal S1 & S2 heard. No JVD. Gastrointestinal system: Abdomen is nondistended, soft and nontender. Normal bowel sounds heard. Central nervous system: Alert and oriented. No focal neurological deficits. Extremities: no cyanosis or clubbing.   Data Reviewed: Basic Metabolic Panel: Recent Labs  Lab 05/07/18 1351 05/11/18 0841 05/12/18 0524  NA 132* 137 135  K 3.7 3.5 3.7  CL 99 103 105  CO2 25 25 23   GLUCOSE 209* 148* 121*  BUN 23 40* 32*  CREATININE 1.30* 1.40* 1.08*  CALCIUM 8.2* 8.6* 7.9*   Liver Function Tests: Recent Labs  Lab 05/07/18 1351 05/11/18 0841  AST 36 20  ALT 31 24  ALKPHOS 111 91  BILITOT 0.7 0.6  PROT 8.6* 8.0  ALBUMIN 3.4* 3.4*   Recent Labs  Lab 05/11/18 0841  LIPASE 38   No results for input(s): AMMONIA in the last 168 hours. CBC: Recent Labs  Lab 05/07/18 1351 05/11/18 0837 05/12/18 0524  WBC 7.7 16.7* 11.0*  NEUTROABS 5.0  --   --   HGB 12.8 11.9* 10.4*  HCT 38.8 36.3 32.0*  MCV 92.4 92.8 92.0  PLT 228 213 176   Cardiac Enzymes: Recent Labs  Lab 05/11/18 0841 05/11/18 1826 05/11/18 2355 05/12/18 0524  TROPONINI <0.03 <0.03 <0.03 <0.03   CBG (last 3)  Recent Labs    05/11/18 2114 05/12/18 0753 05/12/18 1127  GLUCAP 141* 102* 198*   No results found for this or any previous visit  (from the past 240 hour(s)).   Studies: Dg Chest 2 View  Result Date: 05/11/2018 CLINICAL DATA:  Syncope EXAM: CHEST - 2 VIEW COMPARISON:  05/07/2018 FINDINGS: Cardiomegaly. Peripheral density at the left base likely reflects scarring, stable dating back to 2017. No confluent opacity on the right. No effusions or edema. No acute bony abnormality. IMPRESSION: Cardiomegaly.  Left basilar scarring.  No active disease. Electronically Signed   By: Rolm Baptise M.D.   On: 05/11/2018 09:59   Ct Head Wo Contrast  Result Date: 05/11/2018 CLINICAL DATA:  Syncope. EXAM: CT HEAD WITHOUT CONTRAST TECHNIQUE: Contiguous axial images were obtained from the base of the skull through the vertex without intravenous contrast. COMPARISON:  CT scan of February 19, 2017. FINDINGS: Brain: Mild diffuse cortical atrophy is noted. Mild chronic ischemic white matter disease is noted. No mass effect or midline shift is noted. Ventricular size is within normal limits. There is no evidence of mass lesion, hemorrhage or acute infarction. Vascular: No hyperdense vessel or unexpected calcification. Skull: Normal. Negative for fracture or focal lesion. Sinuses/Orbits: No acute finding. Other: None. IMPRESSION: Mild diffuse cortical atrophy. Mild chronic ischemic white matter disease. No acute intracranial abnormality seen. Electronically Signed   By: Marijo Conception, M.D.   On: 05/11/2018 10:27   Ct Abdomen Pelvis W Contrast  Result Date: 05/11/2018 CLINICAL DATA:  Left-sided abdominal pain and recent syncopal episode EXAM: CT ABDOMEN AND PELVIS WITH CONTRAST TECHNIQUE: Multidetector CT imaging of the abdomen and pelvis was performed using the standard protocol following bolus administration of intravenous contrast. CONTRAST:  74mL ISOVUE-300 IOPAMIDOL (ISOVUE-300) INJECTION 61% COMPARISON:  12/11/2016 FINDINGS: Lower chest: Mild scarring is noted in the bases bilaterally. No focal infiltrate or sizable effusion is seen. Hepatobiliary:  Gallstones are again identified without complicating factors. The liver is mildly decreased in attenuation consistent with fatty infiltration. Pancreas: Unremarkable. No pancreatic ductal dilatation or surrounding inflammatory changes. Spleen: Normal in size without focal abnormality. Adrenals/Urinary Tract: Adrenal glands are within normal limits. No renal calculi or obstructive changes are noted. Stable left renal cyst is noted. Bladder is partially decompressed. Stomach/Bowel: Small hiatal hernia is noted. Scattered diverticular changes noted without evidence of diverticulitis. No obstructive or inflammatory changes of the bowel are seen. The appendix is not well visualized although no inflammatory changes are noted. Vascular/Lymphatic: Aortic atherosclerosis. No enlarged abdominal or pelvic lymph nodes. Reproductive: Status post hysterectomy. No adnexal masses. Other: No free fluid is noted. There is thickening of the rectus muscle on the right consistent with a spontaneous rectus hematoma. Correlate with any blood thinner administration or recent trauma. No definitive active extravasation is noted at this time. Musculoskeletal: Left hip replacement is noted. Degenerative changes of the lumbar spine are seen.  IMPRESSION: Changes consistent with right rectus muscular hematoma. No definitive active extravasation is noted at this time. Correlate with blood thinner administration or recent trauma. Stable cholelithiasis. Stable hiatal hernia. Diverticulosis without diverticulitis. Electronically Signed   By: Inez Catalina M.D.   On: 05/11/2018 10:32   Scheduled Meds: . ALPRAZolam  0.5 mg Oral Daily  . atorvastatin  40 mg Oral QPM  . dextromethorphan-guaiFENesin  1 tablet Oral BID  . doxycycline  100 mg Oral Q12H  . insulin aspart  0-20 Units Subcutaneous TID WC  . insulin aspart  0-5 Units Subcutaneous QHS  . insulin glargine  25 Units Subcutaneous BID  . loratadine  10 mg Oral Daily  .  mometasone-formoterol  1 puff Inhalation BID  . predniSONE  30 mg Oral Q breakfast  . QUEtiapine  25 mg Oral BID  . sertraline  50 mg Oral Daily  . sodium chloride flush  3 mL Intravenous Q12H  . tiotropium  18 mcg Inhalation Daily   Continuous Infusions: . lactated ringers 1,000 mL with potassium chloride 20 mEq infusion 100 mL/hr at 05/12/18 1442    Principal Problem:   Syncope Active Problems:   Mild cognitive impairment with memory loss   Hypertension   Diabetes mellitus, type 2 (HCC)   COPD (chronic obstructive pulmonary disease) ([place])   Time spent:   Irwin Brakeman, MD, FAAFP [facility] Pager [phone]  If 7PM-7AM, please contact night-coverage [email or web address] Password [email or web address] 05/12/2018, 3:16 PM    LOS: 0 days

## 2018-05-12 NOTE — Care Management Obs Status (Signed)
[facility] NOTIFICATION   Patient Details  Name: Sheena [name] MRN: [id number] Date of Birth: December 03, 1923   Medicare Observation Status Notification Given:  Yes    Sherald Barge, RN 05/12/2018, 10:32 AM

## 2018-05-12 NOTE — Progress Notes (Signed)
*  PRELIMINARY RESULTS* Echocardiogram 2D Echocardiogram has been performed.  Sheena [name] 05/12/2018, 4:20 PM

## 2018-05-12 NOTE — Plan of Care (Signed)
  Problem: Acute Rehab PT Goals(only PT should resolve) Goal: Pt Will Go Supine/Side To Sit Outcome: Progressing Flowsheets (Taken 05/12/2018 1411) Pt will go Supine/Side to Sit: with supervision Goal: Patient Will Transfer Sit To/From Stand Outcome: Progressing Flowsheets (Taken 05/12/2018 1411) Patient will transfer sit to/from stand: with supervision Goal: Pt Will Transfer Bed To Chair/Chair To Bed Outcome: Progressing Flowsheets (Taken 05/12/2018 1411) Pt will Transfer Bed to Chair/Chair to Bed: with supervision Goal: Pt Will Ambulate Outcome: Progressing Flowsheets (Taken 05/12/2018 1411) Pt will Ambulate: 50 feet; with supervision; with rolling walker   2:11 PM, 05/12/18 Lonell Grandchild, MPT Physical Therapist with [facility] [phone] office [phone] mobile phone

## 2018-05-13 DIAGNOSIS — G3184 Mild cognitive impairment, so stated: Secondary | ICD-10-CM

## 2018-05-13 DIAGNOSIS — J449 Chronic obstructive pulmonary disease, unspecified: Secondary | ICD-10-CM | POA: Diagnosis not present

## 2018-05-13 DIAGNOSIS — E119 Type 2 diabetes mellitus without complications: Secondary | ICD-10-CM | POA: Diagnosis not present

## 2018-05-13 DIAGNOSIS — I10 Essential (primary) hypertension: Secondary | ICD-10-CM | POA: Diagnosis not present

## 2018-05-13 DIAGNOSIS — R55 Syncope and collapse: Secondary | ICD-10-CM | POA: Diagnosis not present

## 2018-05-13 LAB — BASIC METABOLIC PANEL
ANION GAP: 7 (ref 5–15)
BUN: 26 mg/dL — ABNORMAL HIGH (ref 8–23)
CALCIUM: 8 mg/dL — AB (ref 8.9–10.3)
CO2: 24 mmol/L (ref 22–32)
CREATININE: 1.03 mg/dL — AB (ref 0.44–1.00)
Chloride: 106 mmol/L (ref 98–111)
GFR, EST AFRICAN AMERICAN: 53 mL/min — AB (ref >60)
GFR, EST NON AFRICAN AMERICAN: 46 mL/min — AB (ref >60)
GLUCOSE: 118 mg/dL — AB (ref 70–99)
Potassium: 3.8 mmol/L (ref 3.5–5.1)
Sodium: 137 mmol/L (ref 135–145)

## 2018-05-13 LAB — GLUCOSE, CAPILLARY
GLUCOSE-CAPILLARY: 199 mg/dL — AB (ref 70–99)
Glucose-Capillary: 112 mg/dL — ABNORMAL HIGH (ref 70–99)

## 2018-05-13 LAB — MAGNESIUM: MAGNESIUM: 1.9 mg/dL (ref 1.7–2.4)

## 2018-05-13 MED ORDER — PREDNISONE 20 MG PO TABS
20.0000 mg | ORAL_TABLET | Freq: Every day | ORAL | Status: DC
  Administered 2018-05-13: 20 mg via ORAL
  Filled 2018-05-13: qty 1

## 2018-05-13 MED ORDER — PREDNISONE 10 MG PO TABS: ORAL_TABLET | ORAL | 0 refills | Status: DC

## 2018-05-13 MED ORDER — ALPRAZOLAM 1 MG PO TABS: 0.5000 mg | ORAL_TABLET | Freq: Every day | ORAL | 0 refills | Status: DC

## 2018-05-13 MED ORDER — ALPRAZOLAM 0.5 MG PO TABS: 0.5000 mg | ORAL_TABLET | Freq: Every day | ORAL | 0 refills | Status: DC | PRN

## 2018-05-13 NOTE — Discharge Instructions (Signed)
Fall Prevention in the Home Falls can cause injuries. They can happen to people of all ages. There are many things you can do to make your home safe and to help prevent falls. What can I do on the outside of my home?  Regularly fix the edges of walkways and driveways and fix any cracks.  Remove anything that might make you trip as you walk through a door, such as a raised step or threshold.  Trim any bushes or trees on the path to your home.  Use bright outdoor lighting.  Clear any walking paths of anything that might make someone trip, such as rocks or tools.  Regularly check to see if handrails are loose or broken. Make sure that both sides of any steps have handrails.  Any raised decks and porches should have guardrails on the edges.  Have any leaves, snow, or ice cleared regularly.  Use sand or salt on walking paths during winter.  Clean up any spills in your garage right away. This includes oil or grease spills. What can I do in the bathroom?  Use night lights.  Install grab bars by the toilet and in the tub and shower. Do not use towel bars as grab bars.  Use non-skid mats or decals in the tub or shower.  If you need to sit down in the shower, use a plastic, non-slip stool.  Keep the floor dry. Clean up any water that spills on the floor as soon as it happens.  Remove soap buildup in the tub or shower regularly.  Attach bath mats securely with double-sided non-slip rug tape.  Do not have throw rugs and other things on the floor that can make you trip. What can I do in the bedroom?  Use night lights.  Make sure that you have a light by your bed that is easy to reach.  Do not use any sheets or blankets that are too big for your bed. They should not hang down onto the floor.  Have a firm chair that has side arms. You can use this for support while you get dressed.  Do not have throw rugs and other things on the floor that can make you trip. What can I do in the  kitchen?  Clean up any spills right away.  Avoid walking on wet floors.  Keep items that you use a lot in easy-to-reach places.  If you need to reach something above you, use a strong step stool that has a grab bar.  Keep electrical cords out of the way.  Do not use floor polish or wax that makes floors slippery. If you must use wax, use non-skid floor wax.  Do not have throw rugs and other things on the floor that can make you trip. What can I do with my stairs?  Do not leave any items on the stairs.  Make sure that there are handrails on both sides of the stairs and use them. Fix handrails that are broken or loose. Make sure that handrails are as long as the stairways.  Check any carpeting to make sure that it is firmly attached to the stairs. Fix any carpet that is loose or worn.  Avoid having throw rugs at the top or bottom of the stairs. If you do have throw rugs, attach them to the floor with carpet tape.  Make sure that you have a light switch at the top of the stairs and the bottom of the stairs. If you do  not have them, ask someone to add them for you. What else can I do to help prevent falls?  Wear shoes that: ? Do not have high heels. ? Have rubber bottoms. ? Are comfortable and fit you well. ? Are closed at the toe. Do not wear sandals.  If you use a stepladder: ? Make sure that it is fully opened. Do not climb a closed stepladder. ? Make sure that both sides of the stepladder are locked into place. ? Ask someone to hold it for you, if possible.  Clearly mark and make sure that you can see: ? Any grab bars or handrails. ? First and last steps. ? Where the edge of each step is.  Use tools that help you move around (mobility aids) if they are needed. These include: ? Canes. ? Walkers. ? Scooters. ? Crutches.  Turn on the lights when you go into a dark area. Replace any light bulbs as soon as they burn out.  Set up your furniture so you have a clear path.  Avoid moving your furniture around.  If any of your floors are uneven, fix them.  If there are any pets around you, be aware of where they are.  Review your medicines with your doctor. Some medicines can make you feel dizzy. This can increase your chance of falling. Ask your doctor what other things that you can do to help prevent falls. This information is not intended to replace advice given to you by your health care provider. Make sure you discuss any questions you have with your health care provider. Document Released: 05/23/2009 Document Revised: 01/02/2016 Document Reviewed: 08/31/2014 Elsevier Interactive Patient Education  2018 [place]   Follow with Primary MD  Lorene Dy, MD  and other consultants as instructed your Hospitalist MD  Please get a complete blood count and chemistry panel checked by your Primary MD at your next visit, and again as instructed by your Primary MD.  Get Medicines reviewed and adjusted: Please take all your medications with you for your next visit with your Primary MD  Laboratory/radiological data: Please request your Primary MD to go over all hospital tests and procedure/radiological results at the follow up, please ask your Primary MD to get all Hospital records sent to his/her office.  In some cases, they will be blood work, cultures and biopsy results pending at the time of your discharge. Please request that your primary care M.D. follows up on these results.  Also Note the following: If you experience worsening of your admission symptoms, develop shortness of breath, life threatening emergency, suicidal or homicidal thoughts you must seek medical attention immediately by calling 911 or calling your MD immediately  if symptoms less severe.  You must read complete instructions/literature along with all the possible adverse reactions/side effects for all the Medicines you take and that have been prescribed to you. Take any new Medicines  after you have completely understood and accpet all the possible adverse reactions/side effects.   Do not drive when taking Pain medications or sleeping medications (Benzodaizepines)  Do not take more than prescribed Pain, Sleep and Anxiety Medications. It is not advisable to combine anxiety,sleep and pain medications without talking with your primary care practitioner  Special Instructions: If you have smoked or chewed Tobacco  in the last 2 yrs please stop smoking, stop any regular Alcohol  and or any Recreational drug use.  Wear Seat belts while driving.  Please note: You were cared for by a hospitalist  during your hospital stay. Once you are discharged, your primary care physician will handle any further medical issues. Please note that NO REFILLS for any discharge medications will be authorized once you are discharged, as it is imperative that you return to your primary care physician (or establish a relationship with a primary care physician if you do not have one) for your post hospital discharge needs so that they can reassess your need for medications and monitor your lab values.

## 2018-05-13 NOTE — Discharge Summary (Signed)
Physician Discharge Summary  Sheena [name] IEP:[id number] DOB: 03-07-1924 DOA: 05/11/2018  PCP: Lorene Dy, MD  Admit date: 05/11/2018 Discharge date: 05/13/2018  Admitted From: ALF Disposition: ALF ---->Declined SNF  Home health PT   Recommendations for Outpatient Follow-up:  1. Follow up with PCP in 1 weeks  Discharge Condition: STABLE   CODE STATUS: DNR    Brief Hospitalization Summary: Please see all hospital notes, images, labs for full details of the hospitalization.  HPI: Sheena [name] is a 84 y.o. female with medical history significant of hypertension, diabetes, who was recently discharged from hospital after being treated for COPD/acute bronchitis.  She was discharged home on antibiotics, nebulizer treatments and steroids.  Patient was reportedly doing well after discharge.  Today, she was sitting in her chair and was noted to be unresponsive.  EMS was called and she was found to be clammy.  Blood pressure was noted to be 80/61 per EMS.  She was mildly hypoxemic and was started on 2 L.  She has not had any worsening of her cough in fact that is doing better.  She has had no chest pain.  She has had no fever.  P.o. intake has been adequate.  ED Course: EKG is unrevealing.  Cardiac enzymes negative.  Labs show mildly elevated BUN to creatinine ratio.  She does have a leukocytosis, likely related to steroids.  CT head did not show any acute findings.  Chest x-ray is also unrevealing.  Brief Admission Hx: Sheena [name] a 84 y.o.femalewith medical history significant ofhypertension, diabetes, who was recently discharged from hospital after being treated for COPD/acute bronchitis. She was discharged home on antibiotics, nebulizer treatments and steroids. Patient was reportedly doing well after discharge. Today, she was sitting in her chair and was noted to be unresponsive. EMS was called and she was found to be clammy. Blood pressure was noted to be 80/61 per EMS.  She was mildly hypoxemic and was started on 2 L. She has not had any worsening of her cough in fact that is doing better. She has had no chest pain. She has had no fever. P.O.  intake has been adequate.  MDM/Assessment & Plan: 1. Syncope. Likely vasovagal in origin. Suspect that patient was dehydrated and is improving with IVFs. Echocardiogram preserved EF with grade 1 DD.  Troponin negative x 3.  PT eval recommending SNF but family declined asking for return to ALF with Home health services.  2. Diabetes, type 2  - stable, resume home treatment 3. COPD/bronchitis. No wheezing at this time. Continue as needed nebulizer treatments. Completed antibiotics, finish steroids.  4. Hypertension. Currently stable. Continue to monitor blood pressure. 5. Right lower quadrant abdominal pain. CT abdomen shows right rectus muscular hematoma. Likely related to Lovenox injection she received during her last hospitalization.  DVT prophylaxis:SCDs Code Status:DNR Family Communication:Discussed with daughter and son-in-law at the bedside Disposition Plan:SNF recommended but patient/family declined and wants to return to ALF   Echocardiogram  Study Conclusions  - Left ventricle: The cavity size was normal. Wall thickness was  increased in a pattern of moderate LVH. Systolic function was   vigorous. The estimated ejection fraction was in the range of 65%  to 70%. Wall motion was normal; there were no regional wall   motion abnormalities. Doppler parameters are consistent with abnormal left ventricular relaxation (grade 1 diastolic  dysfunction). Doppler parameters are consistent with high ventricular filling pressure. - Aortic valve: Mildly calcified annulus. Trileaflet; mildly thickened leaflets. There was  mild regurgitation. - Mitral valve: Mildly calcified annulus. Mildly thickened leaflets. - Atrial septum: There was increased thickness of the septum, consistent with lipomatous  hypertrophy. No defect or patent   foramen ovale was identified.  Discharge Diagnoses:  Principal Problem:   Syncope Active Problems:   Mild cognitive impairment with memory loss   Hypertension   Diabetes mellitus, type 2 (HCC)   COPD (chronic obstructive pulmonary disease) (HCC)   Discharge Instructions: Discharge Instructions    Call MD for:  difficulty breathing, headache or visual disturbances   Complete by:  As directed    Call MD for:  extreme fatigue   Complete by:  As directed    Call MD for:  persistant dizziness or light-headedness   Complete by:  As directed    Call MD for:  persistant nausea and vomiting   Complete by:  As directed    Increase activity slowly   Complete by:  As directed      Allergies as of 05/13/2018   No Known Allergies     Medication List    STOP taking these medications   albuterol (2.5 MG/3ML) 0.083% nebulizer solution Commonly known as:  PROVENTIL   doxycycline 100 MG tablet Commonly known as:  VIBRA-TABS     TAKE these medications   ALPRAZolam 1 MG tablet Commonly known as:  XANAX Take 0.5 tablets (0.5 mg total) by mouth daily. What changed:  how much to take   atorvastatin 40 MG tablet Commonly known as:  LIPITOR TAKE ONE TABLET BY MOUTH EVERY DAY. What changed:  when to take this   dextromethorphan-guaiFENesin 30-600 MG 12hr tablet Commonly known as:  MUCINEX DM Take 1 tablet by mouth 2 (two) times daily.   Insulin Glargine 100 UNIT/ML Solostar Pen Commonly known as:  LANTUS Inject 25 Units into the skin 2 (two) times daily.   loratadine 10 MG tablet Commonly known as:  CLARITIN Take 1 tablet (10 mg total) by mouth daily.   mometasone-formoterol 200-5 MCG/ACT Aero Commonly known as:  DULERA Inhale 1 puff into the lungs 2 (two) times daily.   predniSONE 10 MG tablet Commonly known as:  DELTASONE Take 1 tablet daily until all tablets completed What changed:  additional instructions   QUEtiapine 25 MG  tablet Commonly known as:  SEROQUEL Take 25 mg by mouth 2 (two) times daily.   sertraline 50 MG tablet Commonly known as:  ZOLOFT Take 50 mg by mouth daily.   tiotropium 18 MCG inhalation capsule Commonly known as:  SPIRIVA Place 1 capsule (18 mcg total) into inhaler and inhale daily.   traMADol 50 MG tablet Commonly known as:  ULTRAM Take 1 tablet (50 mg total) by mouth every 6 (six) hours as needed.   triamcinolone cream 0.1 % Commonly known as:  KENALOG Apply 1 application topically 2 (two) times daily.      Follow-up Information    Lorene Dy, MD. Schedule an appointment as soon as possible for a visit in 1 week(s).   Specialty:  Internal Medicine Why:  Hospital Follow Up  Contact information: [address], [place] [address] [phone]          No Known Allergies Allergies as of 05/13/2018   No Known Allergies     Medication List    STOP taking these medications   albuterol (2.5 MG/3ML) 0.083% nebulizer solution Commonly known as:  PROVENTIL   doxycycline 100 MG tablet Commonly known as:  VIBRA-TABS     TAKE  these medications   ALPRAZolam 1 MG tablet Commonly known as:  XANAX Take 0.5 tablets (0.5 mg total) by mouth daily. What changed:  how much to take   atorvastatin 40 MG tablet Commonly known as:  LIPITOR TAKE ONE TABLET BY MOUTH EVERY DAY. What changed:  when to take this   dextromethorphan-guaiFENesin 30-600 MG 12hr tablet Commonly known as:  MUCINEX DM Take 1 tablet by mouth 2 (two) times daily.   Insulin Glargine 100 UNIT/ML Solostar Pen Commonly known as:  LANTUS Inject 25 Units into the skin 2 (two) times daily.   loratadine 10 MG tablet Commonly known as:  CLARITIN Take 1 tablet (10 mg total) by mouth daily.   mometasone-formoterol 200-5 MCG/ACT Aero Commonly known as:  DULERA Inhale 1 puff into the lungs 2 (two) times daily.   predniSONE 10 MG tablet Commonly known as:  DELTASONE Take 1 tablet daily until  all tablets completed What changed:  additional instructions   QUEtiapine 25 MG tablet Commonly known as:  SEROQUEL Take 25 mg by mouth 2 (two) times daily.   sertraline 50 MG tablet Commonly known as:  ZOLOFT Take 50 mg by mouth daily.   tiotropium 18 MCG inhalation capsule Commonly known as:  SPIRIVA Place 1 capsule (18 mcg total) into inhaler and inhale daily.   traMADol 50 MG tablet Commonly known as:  ULTRAM Take 1 tablet (50 mg total) by mouth every 6 (six) hours as needed.   triamcinolone cream 0.1 % Commonly known as:  KENALOG Apply 1 application topically 2 (two) times daily.       Procedures/Studies: Dg Chest 2 View  Result Date: 05/11/2018 CLINICAL DATA:  Syncope EXAM: CHEST - 2 VIEW COMPARISON:  05/07/2018 FINDINGS: Cardiomegaly. Peripheral density at the left base likely reflects scarring, stable dating back to 2017. No confluent opacity on the right. No effusions or edema. No acute bony abnormality. IMPRESSION: Cardiomegaly.  Left basilar scarring.  No active disease. Electronically Signed   By: Rolm Baptise M.D.   On: 05/11/2018 09:59   Dg Chest 2 View  Result Date: 05/07/2018 CLINICAL DATA:  Cough. History of asthma, diabetes, hypertension and malignancy. EXAM: CHEST - 2 VIEW COMPARISON:  04/30/2016 FINDINGS: Grossly unchanged enlarged cardiac silhouette and mediastinal contours given reduced lung volumes and patient rotation. There is minimal deviation of the tracheal air column at the level of the aortic arch. Atherosclerotic plaque within a tortuous and potentially mildly ectatic thoracic aorta. There is persistent thickening of the right paratracheal stripe presumably secondary to prominent vasculature. Mild cephalization of flow without frank evidence of edema. Grossly unchanged bibasilar heterogeneous opacities, left greater than right. No new focal airspace opacities. No pleural effusion or pneumothorax. No acute osseus abnormalities. Surgical clips overlie the  upper outer quadrant of the left breast. IMPRESSION: Similar findings of cardiomegaly, pulmonary venous congestion and bibasilar atelectasis without superimposed acute cardiopulmonary disease on this slightly hypoventilated examination. Electronically Signed   By: Sandi Mariscal M.D.   On: 05/07/2018 14:15   Ct Head Wo Contrast  Result Date: 05/11/2018 CLINICAL DATA:  Syncope. EXAM: CT HEAD WITHOUT CONTRAST TECHNIQUE: Contiguous axial images were obtained from the base of the skull through the vertex without intravenous contrast. COMPARISON:  CT scan of February 19, 2017. FINDINGS: Brain: Mild diffuse cortical atrophy is noted. Mild chronic ischemic white matter disease is noted. No mass effect or midline shift is noted. Ventricular size is within normal limits. There is no evidence of mass lesion, hemorrhage or acute  infarction. Vascular: No hyperdense vessel or unexpected calcification. Skull: Normal. Negative for fracture or focal lesion. Sinuses/Orbits: No acute finding. Other: None. IMPRESSION: Mild diffuse cortical atrophy. Mild chronic ischemic white matter disease. No acute intracranial abnormality seen. Electronically Signed   By: Marijo Conception, M.D.   On: 05/11/2018 10:27   Ct Abdomen Pelvis W Contrast  Result Date: 05/11/2018 CLINICAL DATA:  Left-sided abdominal pain and recent syncopal episode EXAM: CT ABDOMEN AND PELVIS WITH CONTRAST TECHNIQUE: Multidetector CT imaging of the abdomen and pelvis was performed using the standard protocol following bolus administration of intravenous contrast. CONTRAST:  50mL ISOVUE-300 IOPAMIDOL (ISOVUE-300) INJECTION 61% COMPARISON:  12/11/2016 FINDINGS: Lower chest: Mild scarring is noted in the bases bilaterally. No focal infiltrate or sizable effusion is seen. Hepatobiliary: Gallstones are again identified without complicating factors. The liver is mildly decreased in attenuation consistent with fatty infiltration. Pancreas: Unremarkable. No pancreatic ductal  dilatation or surrounding inflammatory changes. Spleen: Normal in size without focal abnormality. Adrenals/Urinary Tract: Adrenal glands are within normal limits. No renal calculi or obstructive changes are noted. Stable left renal cyst is noted. Bladder is partially decompressed. Stomach/Bowel: Small hiatal hernia is noted. Scattered diverticular changes noted without evidence of diverticulitis. No obstructive or inflammatory changes of the bowel are seen. The appendix is not well visualized although no inflammatory changes are noted. Vascular/Lymphatic: Aortic atherosclerosis. No enlarged abdominal or pelvic lymph nodes. Reproductive: Status post hysterectomy. No adnexal masses. Other: No free fluid is noted. There is thickening of the rectus muscle on the right consistent with a spontaneous rectus hematoma. Correlate with any blood thinner administration or recent trauma. No definitive active extravasation is noted at this time. Musculoskeletal: Left hip replacement is noted. Degenerative changes of the lumbar spine are seen. IMPRESSION: Changes consistent with right rectus muscular hematoma. No definitive active extravasation is noted at this time. Correlate with blood thinner administration or recent trauma. Stable cholelithiasis. Stable hiatal hernia. Diverticulosis without diverticulitis. Electronically Signed   By: Inez Catalina M.D.   On: 05/11/2018 10:32      Subjective: Pt feeling much better.  Pt has been eating and drinking much better.    Discharge Exam: Vitals:   05/13/18 0541 05/13/18 0847  BP: (!) 155/66   Pulse: 67   Resp:    Temp: (!) 97.4 F (36.3 C)   SpO2: 96% 95%   Vitals:   05/12/18 2106 05/12/18 2117 05/13/18 0541 05/13/18 0847  BP:  (!) 138/55 (!) 155/66   Pulse:  72 67   Resp:      Temp:  98.6 F (37 C) (!) 97.4 F (36.3 C)   TempSrc:  Oral Oral   SpO2: 92% 91% 96% 95%  Weight:      Height:       General: Pt is alert, awake, not in acute  distress Cardiovascular: normal S1/S2 +, no rubs, no gallops Respiratory: CTA bilaterally, no wheezing, no rhonchi Abdominal: Soft, NT, ND, bowel sounds + Extremities: no edema, no cyanosis   The results of significant diagnostics from this hospitalization (including imaging, microbiology, ancillary and laboratory) are listed below for reference.     Microbiology: No results found for this or any previous visit (from the past 240 hour(s)).   Labs: BNP (last 3 results) No results for input(s): BNP in the last 8760 hours. Basic Metabolic Panel: Recent Labs  Lab 05/07/18 1351 05/11/18 0841 05/12/18 0524 05/13/18 0711  NA 132* 137 135 137  K 3.7 3.5 3.7 3.8  CL  99 103 105 106  CO2 25 25 23 24   GLUCOSE 209* 148* 121* 118*  BUN 23 40* 32* 26*  CREATININE 1.30* 1.40* 1.08* 1.03*  CALCIUM 8.2* 8.6* 7.9* 8.0*  MG  --   --   --  1.9   Liver Function Tests: Recent Labs  Lab 05/07/18 1351 05/11/18 0841  AST 36 20  ALT 31 24  ALKPHOS 111 91  BILITOT 0.7 0.6  PROT 8.6* 8.0  ALBUMIN 3.4* 3.4*   Recent Labs  Lab 05/11/18 0841  LIPASE 38   No results for input(s): AMMONIA in the last 168 hours. CBC: Recent Labs  Lab 05/07/18 1351 05/11/18 0837 05/12/18 0524  WBC 7.7 16.7* 11.0*  NEUTROABS 5.0  --   --   HGB 12.8 11.9* 10.4*  HCT 38.8 36.3 32.0*  MCV 92.4 92.8 92.0  PLT 228 213 176   Cardiac Enzymes: Recent Labs  Lab 05/11/18 0841 05/11/18 1826 05/11/18 2355 05/12/18 0524  TROPONINI <0.03 <0.03 <0.03 <0.03   BNP: Invalid input(s): POCBNP CBG: Recent Labs  Lab 05/12/18 1127 05/12/18 1643 05/12/18 2119 05/13/18 0734 05/13/18 1117  GLUCAP 198* 174* 202* 112* 199*   D-Dimer No results for input(s): DDIMER in the last 72 hours. Hgb A1c No results for input(s): HGBA1C in the last 72 hours. Lipid Profile No results for input(s): CHOL, HDL, LDLCALC, TRIG, CHOLHDL, LDLDIRECT in the last 72 hours. Thyroid function studies No results for input(s): TSH,  T4TOTAL, T3FREE, THYROIDAB in the last 72 hours.  Invalid input(s): FREET3 Anemia work up No results for input(s): VITAMINB12, FOLATE, FERRITIN, TIBC, IRON, RETICCTPCT in the last 72 hours. Urinalysis    Component Value Date/Time   COLORURINE YELLOW 05/11/2018 0820   APPEARANCEUR CLEAR 05/11/2018 0820   APPEARANCEUR Clear 03/05/2014 2034   LABSPEC 1.042 (H) 05/11/2018 0820   LABSPEC 1.010 03/05/2014 2034   PHURINE 5.0 05/11/2018 0820   GLUCOSEU NEGATIVE 05/11/2018 0820   GLUCOSEU Negative 03/05/2014 2034   HGBUR NEGATIVE 05/11/2018 0820   BILIRUBINUR NEGATIVE 05/11/2018 0820   BILIRUBINUR Negative 03/05/2014 2034   KETONESUR NEGATIVE 05/11/2018 0820   PROTEINUR NEGATIVE 05/11/2018 0820   UROBILINOGEN 0.2 06/13/2013 1148   NITRITE NEGATIVE 05/11/2018 0820   LEUKOCYTESUR NEGATIVE 05/11/2018 0820   LEUKOCYTESUR 1+ 03/05/2014 2034   Sepsis Labs Invalid input(s): PROCALCITONIN,  WBC,  LACTICIDVEN Microbiology No results found for this or any previous visit (from the past 240 hour(s)).  Time coordinating discharge:   SIGNED:  Irwin Brakeman, MD  [facility] 05/13/2018, 2:21 PM Pager [phone]  If 7PM-7AM, please contact night-coverage [email or web address] Password [email or web address]

## 2018-05-13 NOTE — NC FL2 (Signed)
[place] LEVEL OF CARE SCREENING TOOL     IDENTIFICATION  Patient Name: Sheena [name] Birthdate: 11/25/1923 Sex: female Admission Date (Current Location): 05/11/2018  [facility] and [place] Number:      Facility and Address:         Provider Number:    Attending Physician Name and Address:  Murlean Iba, MD  Relative Name and Phone Number:       Current Level of Care:   Recommended Level of Care:   Prior Approval Number:    Date Approved/Denied:   PASRR Number:    Discharge Plan:      Current Diagnoses: Patient Active Problem List   Diagnosis Date Noted  . Syncope 05/11/2018  . COPD (chronic obstructive pulmonary disease) ([place]) 05/11/2018  . Acute respiratory failure with hypoxia ([place]) 05/07/2018  . Hypertension   . Diabetes mellitus, type 2 ([place])   . Cholelithiasis 12/11/2016  . Abdominal pain 12/11/2016  . Back pain, thoracic 10/14/2013  . Mild cognitive impairment with memory loss 10/04/2013  . Syncope and collapse 08/26/2013  . Other and unspecified hyperlipidemia 05/14/2013  . Unspecified constipation 05/14/2013  . Osteoarthritis of left hip 01/11/2013  . History of syncope 11/15/2012  . Ankle fracture, left 11/15/2012  . H/O acute renal failure 11/15/2012  . Proximal muscle weakness 10/30/2012  . Neurotic excoriations 09/02/2012  . Loss of balance 06/06/2012  . Melanoma ([place])   . Asthma   . Insomnia, persistent 09/01/2011  . Severe major depression with psychotic features ([place]) 09/01/2011  . Obesity (BMI 30-39.9) 09/01/2011  . Obesity, diabetes, and hypertension syndrome ([place]) 09/01/2011  . COPD with acute exacerbation ([place]) 01/13/2008    Orientation RESPIRATION BLADDER Height & Weight            Weight: 229 lb 0.9 oz (103.9 kg) Height:  5\' 5"  (165.1 cm)  BEHAVIORAL SYMPTOMS/MOOD NEUROLOGICAL BOWEL NUTRITION STATUS           AMBULATORY STATUS COMMUNICATION OF NEEDS Skin                               Personal  Care Assistance Level of Assistance              Functional Limitations Info             SPECIAL CARE FACTORS FREQUENCY                       Contractures      Additional Factors Info                  Current Medications (05/13/2018):  This is the current hospital active medication list Current Facility-Administered Medications  Medication Dose Route Frequency Provider Last Rate Last Dose  . acetaminophen (TYLENOL) tablet 650 mg  650 mg Oral Q6H PRN Kathie Dike, MD       Or  . acetaminophen (TYLENOL) suppository 650 mg  650 mg Rectal Q6H PRN Kathie Dike, MD      . albuterol (PROVENTIL) (2.5 MG/3ML) 0.083% nebulizer solution 2.5 mg  2.5 mg Nebulization Q6H PRN Kathie Dike, MD      . ALPRAZolam [name]) tablet 0.5 mg  0.5 mg Oral Daily Kathie Dike, MD   0.5 mg at 05/13/18 0920  . atorvastatin (LIPITOR) tablet 40 mg  40 mg Oral QPM Kathie Dike, MD   40 mg at 05/12/18 1703  . dextromethorphan-guaiFENesin (  MUCINEX DM) 30-600 MG per 12 hr tablet 1 tablet  1 tablet Oral BID Kathie Dike, MD   1 tablet at 05/13/18 0925  . doxycycline (VIBRA-TABS) tablet 100 mg  100 mg Oral Q12H Kathie Dike, MD   100 mg at 05/13/18 0920  . insulin aspart (novoLOG) injection 0-20 Units  0-20 Units Subcutaneous TID WC Kathie Dike, MD   4 Units at 05/13/18 1154  . insulin aspart (novoLOG) injection 0-5 Units  0-5 Units Subcutaneous QHS Kathie Dike, MD   2 Units at 05/12/18 2122  . insulin glargine (LANTUS) injection 25 Units  25 Units Subcutaneous BID Kathie Dike, MD   25 Units at 05/13/18 0921  . loratadine (CLARITIN) tablet 10 mg  10 mg Oral Daily Kathie Dike, MD   10 mg at 05/13/18 0920  . mometasone-formoterol (DULERA) 200-5 MCG/ACT inhaler 1 puff  1 puff Inhalation BID Kathie Dike, MD   1 puff at 05/13/18 0844  . ondansetron (ZOFRAN) tablet 4 mg  4 mg Oral Q6H PRN Kathie Dike, MD       Or  . ondansetron (ZOFRAN) injection 4 mg  4 mg  Intravenous Q6H PRN Kathie Dike, MD      . predniSONE (DELTASONE) tablet 20 mg  20 mg Oral Q breakfast Johnson, Clanford L, MD   20 mg at 05/13/18 0920  . QUEtiapine (SEROQUEL) tablet 25 mg  25 mg Oral BID Kathie Dike, MD   25 mg at 05/13/18 0920  . sertraline (ZOLOFT) tablet 50 mg  50 mg Oral Daily Kathie Dike, MD   50 mg at 05/13/18 0920  . sodium chloride flush (NS) 0.9 % injection 3 mL  3 mL Intravenous Q12H Kathie Dike, MD      . tiotropium (SPIRIVA) inhalation capsule 18 mcg  18 mcg Inhalation Daily Kathie Dike, MD   18 mcg at 05/13/18 0844  . traMADol (ULTRAM) tablet 50 mg  50 mg Oral Q6H PRN Kathie Dike, MD         Discharge Medications: Medication List    STOP taking these medications   albuterol (2.5 MG/3ML) 0.083% nebulizer solution Commonly known as:  PROVENTIL   doxycycline 100 MG tablet Commonly known as:  VIBRA-TABS     TAKE these medications   ALPRAZolam 1 MG tablet Commonly known as:  XANAX Take 0.5 tablets (0.5 mg total) by mouth daily. What changed:  how much to take   atorvastatin 40 MG tablet Commonly known as:  LIPITOR TAKE ONE TABLET BY MOUTH EVERY DAY. What changed:  when to take this   dextromethorphan-guaiFENesin 30-600 MG 12hr tablet Commonly known as:  MUCINEX DM Take 1 tablet by mouth 2 (two) times daily.   Insulin Glargine 100 UNIT/ML Solostar Pen Commonly known as:  LANTUS Inject 25 Units into the skin 2 (two) times daily.   loratadine 10 MG tablet Commonly known as:  CLARITIN Take 1 tablet (10 mg total) by mouth daily.   mometasone-formoterol 200-5 MCG/ACT Aero Commonly known as:  DULERA Inhale 1 puff into the lungs 2 (two) times daily.   predniSONE 10 MG tablet Commonly known as:  DELTASONE Take 1 tablet daily until all tablets completed What changed:  additional instructions   QUEtiapine 25 MG tablet Commonly known as:  SEROQUEL Take 25 mg by mouth 2 (two) times daily.   sertraline 50 MG  tablet Commonly known as:  ZOLOFT Take 50 mg by mouth daily.   tiotropium 18 MCG inhalation capsule Commonly known as:  [place]  1 capsule (18 mcg total) into inhaler and inhale daily.   traMADol 50 MG tablet Commonly known as:  ULTRAM Take 1 tablet (50 mg total) by mouth every 6 (six) hours as needed.   triamcinolone cream 0.1 % Commonly known as:  KENALOG Apply 1 application topically 2 (two) times daily.     Relevant Imaging Results:  Relevant Lab Results:   Additional Information    Nura Cahoon, Clydene Pugh, LCSW

## 2018-05-13 NOTE — Care Management (Signed)
Pt returning to ALF today. CM has notified [facility] rep. Pt observation and does not need order to resume services.

## 2018-05-13 NOTE — Progress Notes (Signed)
IV removed, WNL. D/C instructions given to pt and family members at bedside. Family members at bedside to transport back to [facility] facility.

## 2018-05-13 NOTE — Progress Notes (Signed)
Physical Therapy Treatment Patient Details Name: Sheena [name] MRN: [id number] DOB: [birth year] Today's Date: 05/13/2018    History of Present Illness Sheena [name] is a 84 y.o. female with medical history significant of hypertension, diabetes, who was recently discharged from hospital after being treated for COPD/acute bronchitis.  She was discharged home on antibiotics, nebulizer treatments and steroids.  Patient was reportedly doing well after discharge.  Today, she was sitting in her chair and was noted to be unresponsive.  EMS was called and she was found to be clammy.  Blood pressure was noted to be 80/61 per EMS.  She was mildly hypoxemic and was started on 2 L.  She has not had any worsening of her cough in fact that is doing better.  She has had no chest pain.  She has had no fever.  P.o. intake has been adequate.    PT Comments    Pt is seen for evaluation of her mobility today and declined to do a walk.  Instead she performed there ex and demonstrates an excellent awareness of her limitations and could give PT guidance on her weakness and energy level.  Talked with patient about getting OOB to walk later as she felt able.  Follow Up Recommendations  SNF     Equipment Recommendations  None recommended by PT    Recommendations for Other Services       Precautions / Restrictions Precautions Precautions: Fall Restrictions Weight Bearing Restrictions: No    Mobility  Bed Mobility Overal bed mobility: Needs Assistance             General bed mobility comments: declined to get OOB  Transfers                    Ambulation/Gait                 Stairs             Wheelchair Mobility    Modified Rankin (Stroke Patients Only)       Balance                                            Cognition Arousal/Alertness: Awake/alert Behavior During Therapy: WFL for tasks assessed/performed Overall Cognitive Status: Within  Functional Limits for tasks assessed                                        Exercises General Exercises - Lower Extremity Ankle Circles/Pumps: AROM;Both;10 reps Quad Sets: AROM;Both;10 reps Gluteal Sets: AROM;Both;10 reps Heel Slides: AAROM;AROM;Both;10 reps Hip ABduction/ADduction: AROM;AAROM;Both;10 reps Straight Leg Raises: AAROM Hip Flexion/Marching: Both;AAROM    General Comments        Pertinent Vitals/Pain Pain Assessment: No/denies pain    Home Living                      Prior Function            PT Goals (current goals can now be found in the care plan section) Acute Rehab PT Goals Patient Stated Goal: return home Progress towards PT goals: Progressing toward goals    Frequency    Min 3X/week      PT Plan Current plan remains appropriate    Co-evaluation  AM-PAC PT "6 Clicks" Daily Activity  Outcome Measure  Difficulty turning over in bed (including adjusting bedclothes, sheets and blankets)?: A Little Difficulty moving from lying on back to sitting on the side of the bed? : Unable Difficulty sitting down on and standing up from a chair with arms (e.g., wheelchair, bedside commode, etc,.)?: Unable Help needed moving to and from a bed to chair (including a wheelchair)?: A Little Help needed walking in hospital room?: A Little Help needed climbing 3-5 steps with a railing? : A Little 6 Click Score: 14    End of Session Equipment Utilized During Treatment: Gait belt Activity Tolerance: Patient tolerated treatment well;Patient limited by fatigue Patient left: in chair;with call bell/phone within reach;with chair alarm set;with family/visitor present Nurse Communication: Mobility status PT Visit Diagnosis: Unsteadiness on feet (R26.81);Other abnormalities of gait and mobility (R26.89);Muscle weakness (generalized) (M62.81)     Time: 7341-9379 PT Time Calculation (min) (ACUTE ONLY): 30 min  Charges:   $Therapeutic Exercise: 8-22 mins $Therapeutic Activity: 8-22 mins                 Sheena [name] 05/13/2018, 9:18 PM  9:21 PM, 05/13/18 Mee Hives, PT, MS Physical Therapist - [place] [phone] (Office)

## 2018-05-13 NOTE — Clinical Social Work Note (Signed)
Butch Penny at [facility] advised of discharge and discharge clinicals sent.    RN notified family of discharge. Family to transport patient back to facility. Butch Penny advised that family would provide transport.   LCSW signing off.    Allyn Bartelson, Clydene Pugh, LCSW

## 2018-05-26 ENCOUNTER — Emergency Department (HOSPITAL_COMMUNITY)
Admission: EM | Admit: 2018-05-26 | Discharge: 2018-05-26 | Disposition: A | Discharge status: Skilled Nursing Facility | Payer: Medicare Other | Attending: Emergency Medicine | Admitting: Emergency Medicine

## 2018-05-26 ENCOUNTER — Emergency Department (HOSPITAL_COMMUNITY): Payer: Medicare Other

## 2018-05-26 ENCOUNTER — Encounter (HOSPITAL_COMMUNITY): Payer: Self-pay | Admitting: Emergency Medicine

## 2018-05-26 ENCOUNTER — Other Ambulatory Visit: Payer: Self-pay

## 2018-05-26 DIAGNOSIS — Z96642 Presence of left artificial hip joint: Secondary | ICD-10-CM | POA: Insufficient documentation

## 2018-05-26 DIAGNOSIS — Z79899 Other long term (current) drug therapy: Secondary | ICD-10-CM | POA: Insufficient documentation

## 2018-05-26 DIAGNOSIS — E119 Type 2 diabetes mellitus without complications: Secondary | ICD-10-CM | POA: Insufficient documentation

## 2018-05-26 DIAGNOSIS — Z794 Long term (current) use of insulin: Secondary | ICD-10-CM | POA: Insufficient documentation

## 2018-05-26 DIAGNOSIS — R4182 Altered mental status, unspecified: Secondary | ICD-10-CM | POA: Diagnosis present

## 2018-05-26 DIAGNOSIS — I10 Essential (primary) hypertension: Secondary | ICD-10-CM | POA: Diagnosis not present

## 2018-05-26 DIAGNOSIS — J45909 Unspecified asthma, uncomplicated: Secondary | ICD-10-CM | POA: Insufficient documentation

## 2018-05-26 DIAGNOSIS — Z85820 Personal history of malignant melanoma of skin: Secondary | ICD-10-CM | POA: Diagnosis not present

## 2018-05-26 LAB — COMPREHENSIVE METABOLIC PANEL
ALT: 22 U/L (ref 0–44)
AST: 22 U/L (ref 15–41)
Albumin: 3.5 g/dL (ref 3.5–5.0)
Alkaline Phosphatase: 83 U/L (ref 38–126)
Anion gap: 5 (ref 5–15)
BUN: 17 mg/dL (ref 8–23)
CHLORIDE: 106 mmol/L (ref 98–111)
CO2: 28 mmol/L (ref 22–32)
CREATININE: 1.06 mg/dL — AB (ref 0.44–1.00)
Calcium: 8.4 mg/dL — ABNORMAL LOW (ref 8.9–10.3)
GFR, EST AFRICAN AMERICAN: 51 mL/min — AB (ref >60)
GFR, EST NON AFRICAN AMERICAN: 44 mL/min — AB (ref >60)
Glucose, Bld: 95 mg/dL (ref 70–99)
POTASSIUM: 4 mmol/L (ref 3.5–5.1)
Sodium: 139 mmol/L (ref 135–145)
Total Bilirubin: 0.8 mg/dL (ref 0.3–1.2)
Total Protein: 7.4 g/dL (ref 6.5–8.1)

## 2018-05-26 LAB — CBC WITH DIFFERENTIAL/PLATELET
ABS IMMATURE GRANULOCYTES: 0.05 10*3/uL (ref 0.00–0.07)
BASOS ABS: 0 10*3/uL (ref 0.0–0.1)
Basophils Relative: 0 %
EOS PCT: 3 %
Eosinophils Absolute: 0.3 10*3/uL (ref 0.0–0.5)
HEMATOCRIT: 37 % (ref 36.0–46.0)
HEMOGLOBIN: 11.3 g/dL — AB (ref 12.0–15.0)
IMMATURE GRANULOCYTES: 1 %
LYMPHS PCT: 16 %
Lymphs Abs: 1.4 10*3/uL (ref 0.7–4.0)
MCH: 29.2 pg (ref 26.0–34.0)
MCHC: 30.5 g/dL (ref 30.0–36.0)
MCV: 95.6 fL (ref 80.0–100.0)
Monocytes Absolute: 0.9 10*3/uL (ref 0.1–1.0)
Monocytes Relative: 10 %
NEUTROS ABS: 6 10*3/uL (ref 1.7–7.7)
NEUTROS PCT: 70 %
NRBC: 0 % (ref 0.0–0.2)
Platelets: 198 10*3/uL (ref 150–400)
RBC: 3.87 MIL/uL (ref 3.87–5.11)
RDW: 15.2 % (ref 11.5–15.5)
WBC: 8.6 10*3/uL (ref 4.0–10.5)

## 2018-05-26 LAB — URINALYSIS, ROUTINE W REFLEX MICROSCOPIC
BILIRUBIN URINE: NEGATIVE
Glucose, UA: NEGATIVE mg/dL
Hgb urine dipstick: NEGATIVE
Ketones, ur: NEGATIVE mg/dL
LEUKOCYTES UA: NEGATIVE
NITRITE: NEGATIVE
PH: 6 (ref 5.0–8.0)
Protein, ur: NEGATIVE mg/dL
SPECIFIC GRAVITY, URINE: 1.008 (ref 1.005–1.030)

## 2018-05-26 MED ORDER — SODIUM CHLORIDE 0.9 % IV SOLN: INTRAVENOUS | Status: DC

## 2018-05-26 NOTE — ED Triage Notes (Signed)
Per EMS, pt from [facility]. Staff found pt around 1150 and pt was less responsive than usual. Pt was found with head drooping down. Pt usually walks with a walker. Upon EMS arrival, pt was alert, but appeared drowsy and fall asleep. Pt alert at this time. Pt follows commands. Pt will answer some questions. EMS states pt has become more alert.

## 2018-05-26 NOTE — ED Notes (Signed)
Dr. Rogene Houston notified that we are unable to establish IV access, he advised have phlebotomy labs and reassess the need for IV fluids.

## 2018-05-26 NOTE — ED Provider Notes (Signed)
[facility] EMERGENCY DEPARTMENT Provider Note   CSN: [id number] Arrival date & time: 05/26/18  1257     History   Chief Complaint Chief Complaint  Patient presents with  . Altered Mental Status    HPI Sheena [name] is a 84 y.o. female.  Patient sent in from nursing facility, [facility].  Staff found patient around 1150 and patient was less responsive than usual.  Patient was found with head drooping down.  Patient usually walks with a walker.  Upon EMS arrival patient was alert but appeared drowsy and did fall asleep.  Upon arrival here patient was alert and back to baseline following commands.  EMS stated that patient became more alert in route.  Here patient denied any symptoms.  Denied any chest pain headache abdominal pain shortness of breath hurting to pee back pain neck pain.  No history of fall.     Past Medical History:  Diagnosis Date  . Asthma    treated by Dr. Donneta Romberg  . Depression   . Diabetes mellitus, type 2 ([place])   . H/O: hysterectomy 1973   fibroids  . Hypertension   . Left arm pain   . Lumbago   . Melanoma ([place])    left arm, resected, annual CXR's ordered  . Syncope 08/2013   Suspected vagal-mediated    Patient Active Problem List   Diagnosis Date Noted  . Syncope 05/11/2018  . COPD (chronic obstructive pulmonary disease) ([place]) 05/11/2018  . Acute respiratory failure with hypoxia ([place]) 05/07/2018  . Hypertension   . Diabetes mellitus, type 2 ([place])   . Cholelithiasis 12/11/2016  . Abdominal pain 12/11/2016  . Back pain, thoracic 10/14/2013  . Mild cognitive impairment with memory loss 10/04/2013  . Syncope and collapse 08/26/2013  . Other and unspecified hyperlipidemia 05/14/2013  . Unspecified constipation 05/14/2013  . Osteoarthritis of left hip 01/11/2013  . History of syncope 11/15/2012  . Ankle fracture, left 11/15/2012  . H/O acute renal failure 11/15/2012  . Proximal muscle weakness 10/30/2012  . Neurotic excoriations 09/02/2012    . Loss of balance 06/06/2012  . Melanoma ([place])   . Asthma   . Insomnia, persistent 09/01/2011  . Severe major depression with psychotic features ([place]) 09/01/2011  . Obesity (BMI 30-39.9) 09/01/2011  . Obesity, diabetes, and hypertension syndrome ([place]) 09/01/2011  . COPD with acute exacerbation ([place]) 01/13/2008    Past Surgical History:  Procedure Laterality Date  . ABDOMINAL HYSTERECTOMY  1973   fibroid uterus  . BREAST BIOPSY  1985   normal  . JOINT REPLACEMENT   2003   Left Hip  . TOTAL HIP ARTHROPLASTY  2003   left, Dr. Durward Fortes  . TRANSTHORACIC ECHOCARDIOGRAM  08/23/13   EF 60-65%, borderline concentric LVH, impaired LV diastolic relaxation, LA ULN     OB History   None      Home Medications    Prior to Admission medications   Medication Sig Start Date End Date Taking? Authorizing Provider  ALPRAZolam [name]) 0.5 MG tablet Take 1 tablet (0.5 mg total) by mouth daily as needed for anxiety. 05/13/18  Yes Johnson, Clanford L, MD  atorvastatin (LIPITOR) 40 MG tablet TAKE ONE TABLET BY MOUTH EVERY DAY. Patient taking differently: Take 40 mg by mouth every evening.  06/12/14  Yes [name], MD  Insulin Glargine (LANTUS SOLOSTAR) 100 UNIT/ML Solostar Pen Inject 25 Units into the skin 2 (two) times daily. 05/09/18  Yes Kathie Dike, MD  loratadine (CLARITIN) 10 MG tablet  Take 1 tablet (10 mg total) by mouth daily. 05/09/18 05/09/19 Yes Memon, Jolaine Artist, MD  mometasone-formoterol (DULERA) 200-5 MCG/ACT AERO Inhale 1 puff into the lungs 2 (two) times daily. 05/09/18  Yes Kathie Dike, MD  QUEtiapine (SEROQUEL) 25 MG tablet Take 25 mg by mouth 2 (two) times daily.   Yes [provider]  sertraline (ZOLOFT) 50 MG tablet Take 50 mg by mouth daily.   Yes [provider]  tiotropium (SPIRIVA HANDIHALER) 18 MCG inhalation capsule Place 1 capsule (18 mcg total) into inhaler and inhale daily. 05/09/18 05/09/19 Yes Kathie Dike, MD    Family History Family  History  Problem Relation Age of Onset  . Diabetes Mother   . Heart disease Mother        CHF  . Heart disease Father   . Diabetes Father     Social History Social History   Tobacco Use  . Smoking status: Never Smoker  . Smokeless tobacco: Never Used  Substance Use Topics  . Alcohol use: No    Comment: occasional  . Drug use: No     Allergies   Patient has no known allergies.   Review of Systems Review of Systems  Constitutional: Negative for fever.  HENT: Negative for congestion.   Eyes: Negative for redness.  Respiratory: Negative for shortness of breath.   Cardiovascular: Negative for chest pain.  Gastrointestinal: Negative for abdominal pain.  Genitourinary: Negative for dysuria.  Musculoskeletal: Negative for back pain and neck pain.  Skin: Negative for rash and wound.  Neurological: Negative for syncope and headaches.  Hematological: Does not bruise/bleed easily.  Psychiatric/Behavioral: Negative for confusion.     Physical Exam Updated Vital Signs BP (!) 159/47   Pulse 64   Temp 98.1 F (36.7 C) (Oral)   Resp 11   Ht 1.651 m (5\' 5" )   Wt 103 kg   SpO2 92%   BMI 37.79 kg/m   Physical Exam  Constitutional: She is oriented to person, place, and time. She appears well-developed and well-nourished. No distress.  HENT:  Head: Normocephalic and atraumatic.  Mouth/Throat: Oropharynx is clear and moist.  Eyes: Pupils are equal, round, and reactive to light. Conjunctivae and EOM are normal.  Neck: Neck supple.  Cardiovascular: Normal rate and regular rhythm.  Pulmonary/Chest: Effort normal and breath sounds normal.  Abdominal: Soft. Bowel sounds are normal. There is no tenderness.  Musculoskeletal: Normal range of motion. She exhibits no edema.  Neurological: She is alert and oriented to person, place, and time. No cranial nerve deficit. She exhibits normal muscle tone. Coordination normal.  Skin: Skin is warm. No rash noted.  Nursing note and vitals  reviewed.    ED Treatments / Results  Labs (all labs ordered are listed, but only abnormal results are displayed) Labs Reviewed  COMPREHENSIVE METABOLIC PANEL - Abnormal; Notable for the following components:      Result Value   Creatinine, Ser 1.06 (*)    Calcium 8.4 (*)    GFR calc non Af Amer 44 (*)    GFR calc Af Amer 51 (*)    All other components within normal limits  CBC WITH DIFFERENTIAL/PLATELET - Abnormal; Notable for the following components:   Hemoglobin 11.3 (*)    All other components within normal limits  URINALYSIS, ROUTINE W REFLEX MICROSCOPIC    EKG EKG Interpretation  Date/Time:  Thursday May 26 2018 13:16:18 EDT Ventricular Rate:  67 PR Interval:    QRS Duration: 88 QT Interval:  401  QTC Calculation: 424 R Axis:   -16 Text Interpretation:  Sinus rhythm Left ventricular hypertrophy No significant change since last tracing Confirmed by Fredia Sorrow [id number]) on 05/26/2018 1:29:17 PM   Radiology Dg Chest 2 View  Result Date: 05/26/2018 CLINICAL DATA:  Found unresponsive. EXAM: CHEST - 2 VIEW COMPARISON:  05/11/2018 FINDINGS: Artifact overlies the chest. Heart size is normal. Mediastinal shadows are normal. Lungs are clear. No effusions. No acute bone finding. IMPRESSION: No active cardiopulmonary disease. Electronically Signed   By: Nelson Chimes M.D.   On: 05/26/2018 15:17   Ct Head Wo Contrast  Result Date: 05/26/2018 CLINICAL DATA:  Altered mental status today. EXAM: CT HEAD WITHOUT CONTRAST TECHNIQUE: Contiguous axial images were obtained from the base of the skull through the vertex without intravenous contrast. COMPARISON:  05/11/2018 FINDINGS: Brain: No evidence of acute infarction, hemorrhage, hydrocephalus, extra-axial collection or mass lesion/mass effect. Moderate brain parenchymal volume loss and deep white matter microangiopathy. Vascular: No hyperdense vessel or unexpected calcification. Calcific atherosclerotic disease of the  intracranial vessels noted. Skull: Normal. Negative for fracture or focal lesion. Sinuses/Orbits: No acute finding. Other: None. IMPRESSION: No acute intracranial abnormality. Moderate brain parenchymal atrophy and chronic microvascular disease. Electronically Signed   By: Fidela Salisbury M.D.   On: 05/26/2018 15:32    Procedures Procedures (including critical care time)  Medications Ordered in ED Medications - No data to display   Initial Impression / Assessment and Plan / ED Course  I have reviewed the triage vital signs and the nursing notes.  Pertinent labs & imaging results that were available during my care of the patient were reviewed by me and considered in my medical decision making (see chart for details).    Upon arrival here patient fairly alert followed commands well had no complaints.  Did undergo work-up for the events that were described at the nursing facility.  Head CT chest x-ray urinalysis labs without significant abnormality.  Patient stable for discharge back to nursing facility.  Final Clinical Impressions(s) / ED Diagnoses   Final diagnoses:  Altered mental status, unspecified altered mental status type    ED Discharge Orders    None       Fredia Sorrow, MD 05/26/18 2002

## 2018-05-26 NOTE — Discharge Instructions (Addendum)
Patient here much more alert.  No specific complaints.  Work-up here to include CT head chest x-ray labs and urinalysis without any significant abnormalities.  Patient stable for discharge back to nursing facility.

## 2018-09-10 ENCOUNTER — Encounter (HOSPITAL_COMMUNITY): Payer: Self-pay | Admitting: *Deleted

## 2018-09-10 ENCOUNTER — Emergency Department (HOSPITAL_COMMUNITY): Payer: Medicare Other

## 2018-09-10 ENCOUNTER — Emergency Department (HOSPITAL_COMMUNITY)
Admission: EM | Admit: 2018-09-10 | Discharge: 2018-09-10 | Disposition: A | Discharge status: Home / Self Care | Payer: Medicare Other | Attending: Emergency Medicine | Admitting: Emergency Medicine

## 2018-09-10 ENCOUNTER — Other Ambulatory Visit: Payer: Self-pay

## 2018-09-10 DIAGNOSIS — R404 Transient alteration of awareness: Secondary | ICD-10-CM | POA: Diagnosis not present

## 2018-09-10 DIAGNOSIS — Z79899 Other long term (current) drug therapy: Secondary | ICD-10-CM | POA: Insufficient documentation

## 2018-09-10 DIAGNOSIS — Z96642 Presence of left artificial hip joint: Secondary | ICD-10-CM | POA: Diagnosis not present

## 2018-09-10 DIAGNOSIS — J45909 Unspecified asthma, uncomplicated: Secondary | ICD-10-CM | POA: Insufficient documentation

## 2018-09-10 DIAGNOSIS — J449 Chronic obstructive pulmonary disease, unspecified: Secondary | ICD-10-CM | POA: Insufficient documentation

## 2018-09-10 DIAGNOSIS — R4182 Altered mental status, unspecified: Secondary | ICD-10-CM | POA: Diagnosis present

## 2018-09-10 DIAGNOSIS — I10 Essential (primary) hypertension: Secondary | ICD-10-CM | POA: Diagnosis not present

## 2018-09-10 DIAGNOSIS — Z794 Long term (current) use of insulin: Secondary | ICD-10-CM | POA: Insufficient documentation

## 2018-09-10 DIAGNOSIS — E119 Type 2 diabetes mellitus without complications: Secondary | ICD-10-CM | POA: Insufficient documentation

## 2018-09-10 DIAGNOSIS — Z85828 Personal history of other malignant neoplasm of skin: Secondary | ICD-10-CM | POA: Insufficient documentation

## 2018-09-10 LAB — BASIC METABOLIC PANEL
Anion gap: 6 (ref 5–15)
BUN: 20 mg/dL (ref 8–23)
CO2: 28 mmol/L (ref 22–32)
CREATININE: 0.91 mg/dL (ref 0.44–1.00)
Calcium: 8.2 mg/dL — ABNORMAL LOW (ref 8.9–10.3)
Chloride: 100 mmol/L (ref 98–111)
GFR calc Af Amer: >60 mL/min (ref >60)
GFR calc non Af Amer: 55 mL/min — ABNORMAL LOW (ref >60)
Glucose, Bld: 150 mg/dL — ABNORMAL HIGH (ref 70–99)
POTASSIUM: 4 mmol/L (ref 3.5–5.1)
Sodium: 134 mmol/L — ABNORMAL LOW (ref 135–145)

## 2018-09-10 LAB — CBC WITH DIFFERENTIAL/PLATELET
Abs Immature Granulocytes: 0.02 10*3/uL (ref 0.00–0.07)
BASOS ABS: 0 10*3/uL (ref 0.0–0.1)
BASOS PCT: 0 %
EOS ABS: 0.1 10*3/uL (ref 0.0–0.5)
EOS PCT: 1 %
HEMATOCRIT: 39.9 % (ref 36.0–46.0)
Hemoglobin: 12.4 g/dL (ref 12.0–15.0)
Immature Granulocytes: 0 %
LYMPHS PCT: 18 %
Lymphs Abs: 1.1 10*3/uL (ref 0.7–4.0)
MCH: 28.7 pg (ref 26.0–34.0)
MCHC: 31.1 g/dL (ref 30.0–36.0)
MCV: 92.4 fL (ref 80.0–100.0)
MONO ABS: 0.5 10*3/uL (ref 0.1–1.0)
Monocytes Relative: 8 %
Neutro Abs: 4.6 10*3/uL (ref 1.7–7.7)
Neutrophils Relative %: 73 %
Platelets: 174 10*3/uL (ref 150–400)
RBC: 4.32 MIL/uL (ref 3.87–5.11)
RDW: 13.6 % (ref 11.5–15.5)
WBC: 6.3 10*3/uL (ref 4.0–10.5)
nRBC: 0 % (ref 0.0–0.2)

## 2018-09-10 LAB — CBG MONITORING, ED
GLUCOSE-CAPILLARY: 132 mg/dL — AB (ref 70–99)
Glucose-Capillary: 61 mg/dL — ABNORMAL LOW (ref 70–99)

## 2018-09-10 LAB — URINALYSIS, ROUTINE W REFLEX MICROSCOPIC
Bilirubin Urine: NEGATIVE
Glucose, UA: NEGATIVE mg/dL
Hgb urine dipstick: NEGATIVE
Ketones, ur: NEGATIVE mg/dL
LEUKOCYTES UA: NEGATIVE
NITRITE: NEGATIVE
PROTEIN: NEGATIVE mg/dL
SPECIFIC GRAVITY, URINE: 1.009 (ref 1.005–1.030)
pH: 6 (ref 5.0–8.0)

## 2018-09-10 MED ORDER — GUAIFENESIN ER 600 MG PO TB12: 600.0000 mg | ORAL_TABLET | Freq: Two times a day (BID) | ORAL | 0 refills | Status: AC

## 2018-09-10 NOTE — ED Provider Notes (Signed)
Medical screening examination/treatment/procedure(s) were conducted as a shared visit with non-physician practitioner(s) and myself.  I personally evaluated the patient during the encounter.  Clinical Impression:   Final diagnoses:  Transient alteration of awareness    The patient is a pleasant 85 year old female presenting from nursing facility after being found to be slightly altered this morning, it is not clear exactly how altered she was however her blood sugar was found to be 60 and after she was given orange juice and 3 packs of sugar her blood sugar came up, the family at the bedside states she looks like she is at her baseline.  They do report that she had some recent respiratory illness, started on an antibiotic and reports that she is looking better to them.  On my exam there is some scattered rhonchi and wheezing but the patient is in no distress.  She coughs occasionally.  Soft abdomen, grips are equal bilaterally, can straight leg raise bilaterally, there is no facial droop, her cranial nerves III through XII appear to be intact.  We will check for alternative causes of hypoglycemia, check for worsening pneumonia lab abnormalities etc.  She does not appear to have had a stroke and I suspect her hypoglycemia was the cause of her altered mental status.   Noemi Chapel, MD 09/10/18 [phone]

## 2018-09-10 NOTE — Discharge Instructions (Signed)
Thank you for allowing me to care for you today. Please return to the emergency department if you have new or worsening symptoms. Take your medications as instructed.  ° °

## 2018-09-10 NOTE — ED Triage Notes (Signed)
Pt brought in by [facility] for c/o altered loc, unsteady gait and confusion; when ems arrived pt was able to answer their questions appropriately; ems checked a cbg and was found to be 64; oral glucose was given and cbg came up to 78; pt is alert and able to state her name; pt denies any pain

## 2018-09-10 NOTE — ED Notes (Signed)
Gave patient meal tray as ordered by physician.

## 2018-09-10 NOTE — ED Notes (Signed)
Patient's CBG 61 RN [place] made aware. Patient given two cups of orange juice with 3 packs of sugar to drink while nurse tries to obtain IV access. Patient drunk all of it.

## 2018-09-10 NOTE — ED Notes (Signed)
Patient drank orange juice x 2 with sugar earlier with no difficulty.

## 2018-09-10 NOTE — ED Provider Notes (Signed)
[facility] Provider Note   CSN: [id number] Arrival date & time: 09/10/18  3220     History   Chief Complaint Chief Complaint  Patient presents with  . Altered Mental Status    HPI Sheena [name] is a 85 y.o. female.  Patient is a 85 y/o F coming from [facility] via EMS for AMS this morning. She has hx of COPD, insulin dependant diabetes.Patient has some dementia and her daughter provides mot of the HPI for me. Patients daughter state nursing home staff found patient with AMS this morning. Patient was sitting up in chair and head "drooping down" and not following commands which is unusual for her. Patient has no recollection of this and states she currently feels fine. Patient's daughter state the patient is at her baseline at this time. Glucose was found to be low 60s and she was given glucose and became more "perked up". Patient's daughter states that the patient has been coughing a lot and she thinks she was taking antibiotics for cough/cold symptoms and chest congestion. Denies fever, n/v/d, chest pain, SOB. Patient has had similar ED visits in the past with unremarkable workups.      Past Medical History:  Diagnosis Date  . Asthma    treated by Dr. Donneta Romberg  . Depression   . Diabetes mellitus, type 2 ([place])   . H/O: hysterectomy 1973   fibroids  . Hypertension   . Left arm pain   . Lumbago   . Melanoma ([place])    left arm, resected, annual CXR's ordered  . Syncope 08/2013   Suspected vagal-mediated    Patient Active Problem List   Diagnosis Date Noted  . Syncope 05/11/2018  . COPD (chronic obstructive pulmonary disease) ([place]) 05/11/2018  . Acute respiratory failure with hypoxia ([place]) 05/07/2018  . Hypertension   . Diabetes mellitus, type 2 ([place])   . Cholelithiasis 12/11/2016  . Abdominal pain 12/11/2016  . Back pain, thoracic 10/14/2013  . Mild cognitive impairment with memory loss 10/04/2013  . Syncope and collapse 08/26/2013  . Other and  unspecified hyperlipidemia 05/14/2013  . Unspecified constipation 05/14/2013  . Osteoarthritis of left hip 01/11/2013  . History of syncope 11/15/2012  . Ankle fracture, left 11/15/2012  . H/O acute renal failure 11/15/2012  . Proximal muscle weakness 10/30/2012  . Neurotic excoriations 09/02/2012  . Loss of balance 06/06/2012  . Melanoma ([place])   . Asthma   . Insomnia, persistent 09/01/2011  . Severe major depression with psychotic features ([place]) 09/01/2011  . Obesity (BMI 30-39.9) 09/01/2011  . Obesity, diabetes, and hypertension syndrome ([place]) 09/01/2011  . COPD with acute exacerbation ([place]) 01/13/2008    Past Surgical History:  Procedure Laterality Date  . ABDOMINAL HYSTERECTOMY  1973   fibroid uterus  . BREAST BIOPSY  1985   normal  . JOINT REPLACEMENT   2003   Left Hip  . TOTAL HIP ARTHROPLASTY  2003   left, Dr. Durward Fortes  . TRANSTHORACIC ECHOCARDIOGRAM  08/23/13   EF 60-65%, borderline concentric LVH, impaired LV diastolic relaxation, LA ULN     OB History   No obstetric history on file.      Home Medications    Prior to Admission medications   Medication Sig Start Date End Date Taking? Authorizing Provider  ALPRAZolam [name]) 0.5 MG tablet Take 1 tablet (0.5 mg total) by mouth daily as needed for anxiety. 05/13/18   Johnson, Clanford L, MD  atorvastatin (LIPITOR) 40 MG tablet TAKE ONE  TABLET BY MOUTH EVERY DAY. Patient taking differently: Take 40 mg by mouth every evening.  06/12/14   [name], MD  Insulin Glargine (LANTUS SOLOSTAR) 100 UNIT/ML Solostar Pen Inject 25 Units into the skin 2 (two) times daily. 05/09/18   Kathie Dike, MD  loratadine (CLARITIN) 10 MG tablet Take 1 tablet (10 mg total) by mouth daily. 05/09/18 05/09/19  Kathie Dike, MD  mometasone-formoterol (DULERA) 200-5 MCG/ACT AERO Inhale 1 puff into the lungs 2 (two) times daily. 05/09/18   Kathie Dike, MD  QUEtiapine (SEROQUEL) 25 MG tablet Take 25 mg by mouth 2 (two) times daily.     [provider]  sertraline (ZOLOFT) 50 MG tablet Take 50 mg by mouth daily.    [provider]  tiotropium (SPIRIVA HANDIHALER) 18 MCG inhalation capsule Place 1 capsule (18 mcg total) into inhaler and inhale daily. 05/09/18 05/09/19  Kathie Dike, MD    Family History Family History  Problem Relation Age of Onset  . Diabetes Mother   . Heart disease Mother        CHF  . Heart disease Father   . Diabetes Father     Social History Social History   Tobacco Use  . Smoking status: Never Smoker  . Smokeless tobacco: Never Used  Substance Use Topics  . Alcohol use: No    Comment: occasional  . Drug use: No     Allergies   Patient has no known allergies.   Review of Systems Review of Systems  Reason unable to perform ROS: limited due to patient dementia.  Constitutional: Positive for activity change and fatigue. Negative for appetite change, diaphoresis, fever and unexpected weight change.  HENT: Negative for congestion, rhinorrhea, sinus pain, sneezing, sore throat and trouble swallowing.   Eyes: Negative for redness and visual disturbance.  Respiratory: Positive for cough and wheezing. Negative for apnea, chest tightness and shortness of breath.   Cardiovascular: Negative for chest pain.  Gastrointestinal: Negative for abdominal pain, diarrhea, nausea and vomiting.  Genitourinary: Negative for dysuria, pelvic pain and urgency.  Musculoskeletal: Negative for arthralgias, back pain, myalgias, neck pain and neck stiffness.  Skin: Negative for rash and wound.  Neurological: Positive for weakness (generalized). Negative for dizziness, syncope, light-headedness and headaches.  Psychiatric/Behavioral: Positive for confusion and decreased concentration.     Physical Exam Updated Vital Signs BP (!) 166/129   Pulse 77   Temp 98.7 F (37.1 C) (Oral)   Resp (!) 23   SpO2 93%   Physical Exam Constitutional:      General: She is not in acute distress.     Appearance: She is not ill-appearing, toxic-appearing or diaphoretic.     Comments: Pleasant, elderly female  HENT:     Head: Normocephalic.     Right Ear: External ear normal. There is no impacted cerumen.     Left Ear: External ear normal. There is no impacted cerumen.     Nose: Nose normal. No congestion or rhinorrhea.     Mouth/Throat:     Mouth: Mucous membranes are moist.     Pharynx: No oropharyngeal exudate or posterior oropharyngeal erythema.  Eyes:     General: No scleral icterus.       Right eye: No discharge.        Left eye: No discharge.     Conjunctiva/sclera: Conjunctivae normal.     Pupils: Pupils are equal, round, and reactive to light.  Neck:     Musculoskeletal: Normal range of motion.  Cardiovascular:     Rate and Rhythm: Normal rate and regular rhythm.     Pulses: Normal pulses.     Heart sounds: No friction rub.  Pulmonary:     Effort: Pulmonary effort is normal.     Comments: Scattered rhonchi and expiratory wheezing throughout Abdominal:     General: Abdomen is flat. Bowel sounds are normal. There is no distension.     Tenderness: There is no abdominal tenderness. There is no guarding.  Skin:    General: Skin is warm.     Capillary Refill: Capillary refill takes less than 2 seconds.  Neurological:     General: No focal deficit present.     Mental Status: She is alert. Mental status is at baseline.     Cranial Nerves: No cranial nerve deficit.  Psychiatric:        Mood and Affect: Mood normal.      ED Treatments / Results  Labs (all labs ordered are listed, but only abnormal results are displayed) Labs Reviewed  BASIC METABOLIC PANEL - Abnormal; Notable for the following components:      Result Value   Sodium 134 (*)    Glucose, Bld 150 (*)    Calcium 8.2 (*)    GFR calc non Af Amer 55 (*)    All other components within normal limits  CBG MONITORING, ED - Abnormal; Notable for the following components:   Glucose-Capillary 61 (*)    All  other components within normal limits  CBG MONITORING, ED - Abnormal; Notable for the following components:   Glucose-Capillary 132 (*)    All other components within normal limits  CBC WITH DIFFERENTIAL/PLATELET  URINALYSIS, ROUTINE W REFLEX MICROSCOPIC    EKG EKG Interpretation  Date/Time:  Saturday September 10 2018 07:28:21 EST Ventricular Rate:  60 PR Interval:    QRS Duration: 99 QT Interval:  455 QTC Calculation: 455 R Axis:   -14 Text Interpretation:  Sinus rhythm Abnormal R-wave progression, early transition Probable left ventricular hypertrophy since last tracing no significant change Confirmed by Noemi Chapel [phone]) on 09/10/2018 8:27:37 AM   Radiology Dg Chest 1 View  Result Date: 09/10/2018 CLINICAL DATA:  Cough.  Altered mental status. EXAM: CHEST  1 VIEW COMPARISON:  05/26/2018 FINDINGS: The heart is moderately enlarged. Vascular congestion. No sign of interstitial edema. No pneumothorax or pleural effusion. Lungs are under aerated with hypoaeration at the lung bases. IMPRESSION: Cardiomegaly and vascular congestion without pulmonary edema. Electronically Signed   By: Marybelle Killings M.D.   On: 09/10/2018 08:26    Procedures Procedures (including critical care time)  Medications Ordered in ED Medications - No data to display   Initial Impression / Assessment and Plan / ED Course  I have reviewed the triage vital signs and the nursing notes.  Pertinent labs & imaging results that were available during my care of the patient were reviewed by me and considered in my medical decision making (see chart for details).  Clinical Course as of Sep 10 998  Sat Sep 10, 2018  0741 Will check regular labs, recheck glucose to be sure it is not declining. Chest xray to see if worsening pneumonia and UA due to AMS. Patient tolerated orange juice and glucose now 132.   [KM]  [phone] Patient remained at her baseline the entire stay. Able to tolerate PO. Unremarkable findings on exam.  Family at bedside. Plan to d/c patient. Symptoms may have been related to low blood sugar. Advised to f/u  PMD or return if worse. Patient and family in agreement with plan.    [KM]    Clinical Course User Index [KM] Alveria Apley, PA-C    Based on review of vitals, medical screening exam, lab work and/or imaging, there does not appear to be an acute, emergent etiology for the patient's symptoms. Counseled pt on good return precautions and encouraged both PCP and ED follow-up as needed.  Prior to discharge, I also discussed incidental imaging findings with patient in detail and advised appropriate, recommended follow-up in detail.  Clinical Impression: 1. Transient alteration of awareness     Disposition: Discharge      Final Clinical Impressions(s) / ED Diagnoses   Final diagnoses:  Transient alteration of awareness    ED Discharge Orders    None       Kristine Royal 09/10/18 1001    Noemi Chapel, MD 09/10/18 [phone]

## 2018-09-12 ENCOUNTER — Other Ambulatory Visit (HOSPITAL_COMMUNITY): Payer: Self-pay | Admitting: Internal Medicine

## 2018-09-12 DIAGNOSIS — J189 Pneumonia, unspecified organism: Secondary | ICD-10-CM

## 2018-09-13 ENCOUNTER — Ambulatory Visit (HOSPITAL_COMMUNITY)
Admission: RE | Admit: 2018-09-13 | Discharge: 2018-09-13 | Disposition: A | Discharge status: Home / Self Care | Payer: Medicare Other | Source: Ambulatory Visit | Attending: Internal Medicine | Admitting: Internal Medicine

## 2018-09-13 DIAGNOSIS — J189 Pneumonia, unspecified organism: Secondary | ICD-10-CM | POA: Insufficient documentation

## 2018-09-16 ENCOUNTER — Inpatient Hospital Stay (HOSPITAL_COMMUNITY)
Admission: EM | Admit: 2018-09-16 | Discharge: 2018-09-19 | DRG: 193 | Disposition: A | Discharge status: Home Health | Payer: Medicare Other | Source: Skilled Nursing Facility | Attending: Internal Medicine | Admitting: Internal Medicine

## 2018-09-16 ENCOUNTER — Other Ambulatory Visit: Payer: Self-pay

## 2018-09-16 ENCOUNTER — Encounter (HOSPITAL_COMMUNITY): Payer: Self-pay | Admitting: Emergency Medicine

## 2018-09-16 ENCOUNTER — Emergency Department (HOSPITAL_COMMUNITY): Payer: Medicare Other

## 2018-09-16 DIAGNOSIS — J449 Chronic obstructive pulmonary disease, unspecified: Secondary | ICD-10-CM

## 2018-09-16 DIAGNOSIS — J189 Pneumonia, unspecified organism: Secondary | ICD-10-CM | POA: Diagnosis not present

## 2018-09-16 DIAGNOSIS — J441 Chronic obstructive pulmonary disease with (acute) exacerbation: Secondary | ICD-10-CM | POA: Diagnosis present

## 2018-09-16 DIAGNOSIS — Z8249 Family history of ischemic heart disease and other diseases of the circulatory system: Secondary | ICD-10-CM

## 2018-09-16 DIAGNOSIS — E876 Hypokalemia: Secondary | ICD-10-CM | POA: Diagnosis present

## 2018-09-16 DIAGNOSIS — J181 Lobar pneumonia, unspecified organism: Principal | ICD-10-CM | POA: Diagnosis present

## 2018-09-16 DIAGNOSIS — E785 Hyperlipidemia, unspecified: Secondary | ICD-10-CM | POA: Diagnosis present

## 2018-09-16 DIAGNOSIS — I10 Essential (primary) hypertension: Secondary | ICD-10-CM | POA: Diagnosis not present

## 2018-09-16 DIAGNOSIS — Z96642 Presence of left artificial hip joint: Secondary | ICD-10-CM | POA: Diagnosis present

## 2018-09-16 DIAGNOSIS — E119 Type 2 diabetes mellitus without complications: Secondary | ICD-10-CM

## 2018-09-16 DIAGNOSIS — Z85820 Personal history of malignant melanoma of skin: Secondary | ICD-10-CM

## 2018-09-16 DIAGNOSIS — J440 Chronic obstructive pulmonary disease with acute lower respiratory infection: Secondary | ICD-10-CM | POA: Diagnosis present

## 2018-09-16 DIAGNOSIS — Z79899 Other long term (current) drug therapy: Secondary | ICD-10-CM

## 2018-09-16 DIAGNOSIS — R0602 Shortness of breath: Secondary | ICD-10-CM | POA: Diagnosis not present

## 2018-09-16 DIAGNOSIS — Z66 Do not resuscitate: Secondary | ICD-10-CM | POA: Diagnosis present

## 2018-09-16 DIAGNOSIS — Z9981 Dependence on supplemental oxygen: Secondary | ICD-10-CM

## 2018-09-16 DIAGNOSIS — Z794 Long term (current) use of insulin: Secondary | ICD-10-CM

## 2018-09-16 DIAGNOSIS — J9621 Acute and chronic respiratory failure with hypoxia: Secondary | ICD-10-CM | POA: Diagnosis present

## 2018-09-16 DIAGNOSIS — Z90710 Acquired absence of both cervix and uterus: Secondary | ICD-10-CM

## 2018-09-16 DIAGNOSIS — Z833 Family history of diabetes mellitus: Secondary | ICD-10-CM

## 2018-09-16 LAB — COMPREHENSIVE METABOLIC PANEL
ALBUMIN: 2.9 g/dL — AB (ref 3.5–5.0)
ALT: 20 U/L (ref 0–44)
AST: 25 U/L (ref 15–41)
Alkaline Phosphatase: 65 U/L (ref 38–126)
Anion gap: 10 (ref 5–15)
BUN: 22 mg/dL (ref 8–23)
CHLORIDE: 98 mmol/L (ref 98–111)
CO2: 23 mmol/L (ref 22–32)
CREATININE: 1.19 mg/dL — AB (ref 0.44–1.00)
Calcium: 8 mg/dL — ABNORMAL LOW (ref 8.9–10.3)
GFR calc Af Amer: 46 mL/min — ABNORMAL LOW (ref >60)
GFR calc non Af Amer: 40 mL/min — ABNORMAL LOW (ref >60)
Glucose, Bld: 186 mg/dL — ABNORMAL HIGH (ref 70–99)
Potassium: 3.3 mmol/L — ABNORMAL LOW (ref 3.5–5.1)
Sodium: 131 mmol/L — ABNORMAL LOW (ref 135–145)
TOTAL PROTEIN: 7.7 g/dL (ref 6.5–8.1)
Total Bilirubin: 0.8 mg/dL (ref 0.3–1.2)

## 2018-09-16 LAB — CBC WITH DIFFERENTIAL/PLATELET
Abs Immature Granulocytes: 0.15 10*3/uL — ABNORMAL HIGH (ref 0.00–0.07)
BASOS ABS: 0 10*3/uL (ref 0.0–0.1)
BASOS PCT: 0 %
EOS ABS: 0 10*3/uL (ref 0.0–0.5)
Eosinophils Relative: 0 %
HEMATOCRIT: 36.1 % (ref 36.0–46.0)
Hemoglobin: 11.7 g/dL — ABNORMAL LOW (ref 12.0–15.0)
IMMATURE GRANULOCYTES: 1 %
LYMPHS ABS: 0.8 10*3/uL (ref 0.7–4.0)
Lymphocytes Relative: 5 %
MCH: 28.8 pg (ref 26.0–34.0)
MCHC: 32.4 g/dL (ref 30.0–36.0)
MCV: 88.9 fL (ref 80.0–100.0)
Monocytes Absolute: 1.8 10*3/uL — ABNORMAL HIGH (ref 0.1–1.0)
Monocytes Relative: 11 %
NEUTROS PCT: 83 %
NRBC: 0 % (ref 0.0–0.2)
Neutro Abs: 14.1 10*3/uL — ABNORMAL HIGH (ref 1.7–7.7)
PLATELETS: 225 10*3/uL (ref 150–400)
RBC: 4.06 MIL/uL (ref 3.87–5.11)
RDW: 13.7 % (ref 11.5–15.5)
WBC: 16.9 10*3/uL — ABNORMAL HIGH (ref 4.0–10.5)

## 2018-09-16 LAB — APTT: aPTT: 58 seconds — ABNORMAL HIGH (ref 24–36)

## 2018-09-16 LAB — INFLUENZA PANEL BY PCR (TYPE A & B)
Influenza A By PCR: NEGATIVE
Influenza B By PCR: NEGATIVE

## 2018-09-16 LAB — PROTIME-INR
INR: 1.22
Prothrombin Time: 15.3 seconds — ABNORMAL HIGH (ref 11.4–15.2)

## 2018-09-16 LAB — LACTIC ACID, PLASMA: Lactic Acid, Venous: 1.3 mmol/L (ref 0.5–1.9)

## 2018-09-16 LAB — CBG MONITORING, ED: Glucose-Capillary: 127 mg/dL — ABNORMAL HIGH (ref 70–99)

## 2018-09-16 LAB — PROCALCITONIN: Procalcitonin: 0.27 ng/mL

## 2018-09-16 MED ORDER — GUAIFENESIN ER 600 MG PO TB12
600.0000 mg | ORAL_TABLET | Freq: Two times a day (BID) | ORAL | Status: DC
  Administered 2018-09-17 – 2018-09-19 (×6): 600 mg via ORAL
  Filled 2018-09-16 (×12): qty 1

## 2018-09-16 MED ORDER — INSULIN ASPART 100 UNIT/ML ~~LOC~~ SOLN
0.0000 [IU] | Freq: Every day | SUBCUTANEOUS | Status: DC
  Administered 2018-09-17 – 2018-09-18 (×2): 2 [IU] via SUBCUTANEOUS

## 2018-09-16 MED ORDER — SODIUM CHLORIDE 0.9 % IV SOLN
1.0000 g | INTRAVENOUS | Status: DC
  Administered 2018-09-16 – 2018-09-18 (×3): 1 g via INTRAVENOUS
  Filled 2018-09-16 (×4): qty 1

## 2018-09-16 MED ORDER — SODIUM CHLORIDE 0.9 % IV SOLN
1.0000 g | Freq: Once | INTRAVENOUS | Status: AC
  Administered 2018-09-16: 1 g via INTRAVENOUS
  Filled 2018-09-16: qty 10

## 2018-09-16 MED ORDER — SODIUM CHLORIDE 0.9 % IV SOLN: 1.0000 g | Freq: Three times a day (TID) | INTRAVENOUS | Status: DC

## 2018-09-16 MED ORDER — SERTRALINE HCL 50 MG PO TABS
50.0000 mg | ORAL_TABLET | Freq: Every day | ORAL | Status: DC
  Administered 2018-09-17 – 2018-09-19 (×4): 50 mg via ORAL
  Filled 2018-09-16 (×4): qty 1

## 2018-09-16 MED ORDER — VANCOMYCIN HCL 10 G IV SOLR
2000.0000 mg | Freq: Once | INTRAVENOUS | Status: AC
  Administered 2018-09-16: 2000 mg via INTRAVENOUS
  Filled 2018-09-16: qty 2000

## 2018-09-16 MED ORDER — POTASSIUM CHLORIDE IN NACL 20-0.9 MEQ/L-% IV SOLN
INTRAVENOUS | Status: DC
  Administered 2018-09-17 (×2): via INTRAVENOUS

## 2018-09-16 MED ORDER — INSULIN ASPART 100 UNIT/ML ~~LOC~~ SOLN
0.0000 [IU] | Freq: Three times a day (TID) | SUBCUTANEOUS | Status: DC
  Administered 2018-09-17: 3 [IU] via SUBCUTANEOUS
  Administered 2018-09-18: 5 [IU] via SUBCUTANEOUS
  Administered 2018-09-18: 3 [IU] via SUBCUTANEOUS
  Administered 2018-09-18: 8 [IU] via SUBCUTANEOUS
  Administered 2018-09-19: 11 [IU] via SUBCUTANEOUS
  Administered 2018-09-19: 8 [IU] via SUBCUTANEOUS

## 2018-09-16 MED ORDER — GUAIFENESIN ER 600 MG PO TB12
ORAL_TABLET | ORAL | Status: AC
  Filled 2018-09-16: qty 1

## 2018-09-16 MED ORDER — IPRATROPIUM-ALBUTEROL 0.5-2.5 (3) MG/3ML IN SOLN
3.0000 mL | Freq: Four times a day (QID) | RESPIRATORY_TRACT | Status: DC
  Administered 2018-09-16 – 2018-09-19 (×12): 3 mL via RESPIRATORY_TRACT
  Filled 2018-09-16 (×11): qty 3

## 2018-09-16 MED ORDER — LORATADINE 10 MG PO TABS
10.0000 mg | ORAL_TABLET | Freq: Every day | ORAL | Status: DC
  Administered 2018-09-17 – 2018-09-19 (×4): 10 mg via ORAL
  Filled 2018-09-16 (×4): qty 1

## 2018-09-16 MED ORDER — INSULIN GLARGINE 100 UNIT/ML ~~LOC~~ SOLN
25.0000 [IU] | Freq: Two times a day (BID) | SUBCUTANEOUS | Status: DC
  Administered 2018-09-17: 25 [IU] via SUBCUTANEOUS
  Filled 2018-09-16 (×7): qty 0.25

## 2018-09-16 MED ORDER — ATORVASTATIN CALCIUM 40 MG PO TABS
40.0000 mg | ORAL_TABLET | Freq: Every evening | ORAL | Status: DC
  Administered 2018-09-17 – 2018-09-18 (×3): 40 mg via ORAL
  Filled 2018-09-16 (×3): qty 1

## 2018-09-16 MED ORDER — SODIUM CHLORIDE 0.9 % IV BOLUS
500.0000 mL | Freq: Once | INTRAVENOUS | Status: AC
  Administered 2018-09-16: 500 mL via INTRAVENOUS

## 2018-09-16 MED ORDER — SODIUM CHLORIDE 0.9 % IV SOLN
500.0000 mg | Freq: Once | INTRAVENOUS | Status: AC
  Administered 2018-09-16: 500 mg via INTRAVENOUS
  Filled 2018-09-16: qty 500

## 2018-09-16 MED ORDER — ACETAMINOPHEN 325 MG PO TABS
650.0000 mg | ORAL_TABLET | ORAL | Status: AC
  Administered 2018-09-16: 650 mg via ORAL
  Filled 2018-09-16: qty 2

## 2018-09-16 MED ORDER — QUETIAPINE FUMARATE 25 MG PO TABS
25.0000 mg | ORAL_TABLET | Freq: Two times a day (BID) | ORAL | Status: DC
  Administered 2018-09-17 – 2018-09-19 (×6): 25 mg via ORAL
  Filled 2018-09-16 (×6): qty 1

## 2018-09-16 MED ORDER — ALPRAZOLAM 0.5 MG PO TABS: 0.5000 mg | ORAL_TABLET | Freq: Every day | ORAL | Status: DC | PRN

## 2018-09-16 MED ORDER — VANCOMYCIN HCL 10 G IV SOLR
1250.0000 mg | INTRAVENOUS | Status: DC
  Administered 2018-09-17: 1250 mg via INTRAVENOUS
  Filled 2018-09-16 (×2): qty 1250

## 2018-09-16 NOTE — ED Notes (Signed)
Pt given ginger ale to drink. 

## 2018-09-16 NOTE — Progress Notes (Addendum)
Pharmacy Antibiotic Note  Sheena [name] is a 85 y.o. female admitted on 09/16/2018 with HCAP ( pneumonia).  Pharmacy has been consulted for vancomycin dosing.  Plan: Vancomycin 2000mg   IV loading dose then 1250mg  IV every 24 hours.  Goal trough 15-20 mcg/mL. Also, on cefepime 1gm IV q24h F/U cxs and clinical progress Monitor V/S, labs and levels as indicated  Height: 5\' 5"  (165.1 cm) Weight: 227 lb (103 kg) IBW/kg (Calculated) : 57  Temp (24hrs), Avg:100.6 F (38.1 C), Min:100.1 F (37.8 C), Max:101.1 F (38.4 C)  Recent Labs  Lab 09/10/18 0806 09/16/18 1057  WBC 6.3 16.9*  CREATININE 0.91 1.19*  LATICACIDVEN  --  1.3    Estimated Creatinine Clearance: 35.9 mL/min (A) (by C-G formula based on SCr of 1.19 mg/dL (H)).    No Known Allergies  Antimicrobials this admission: Vancomycin 2/7 >>   Cefepime 2/7 >>  Azithromycin and Ceftriaxone IV x 1 dose in ED   Dose adjustments this admission: n/a  Microbiology results: 2/7 BCx: pending  UCx:   MRSA PCR:   Thank you for allowing pharmacy to be a part of this patient's care.  Isac Sarna, BS Pharm D, [place] Clinical Pharmacist Pager [phone] 09/16/2018 4:08 PM

## 2018-09-16 NOTE — H&P (Signed)
History and Physical    Sheena [name] NWG:[id number] DOB: [birth year] DOA: 09/16/2018  PCP: Lorene Dy, MD  Patient coming from: Assisted living  I have personally briefly reviewed patient's old medical records in [place]  Chief Complaint: Cough  HPI: Sheena [name] is a 85 y.o. female with medical history significant of COPD, diabetes, hypertension who is a resident of an assisted living facility.  Patient reports having cough for the last several days.  She had associated shortness of breath and wheezing.  She is unaware of any fevers.  She has not had any nausea, vomiting, diarrhea.  No chest pain.  She has been feeling weak.  She normally ambulates with a walker.  She is brought to the ER for worsening shortness of breath and had oxygen applied which she said made her feel better.  Chest x-ray indicates bibasilar atelectasis versus pneumonia.  She was also noted to be febrile with a temperature of 101.  She has been started on intravenous antibiotics.  She received nebulizer treatments.  She has been referred for admission.   Review of Systems: As per HPI otherwise 10 point review of systems negative.    Past Medical History:  Diagnosis Date  . Asthma    treated by Dr. Donneta Romberg  . Depression   . Diabetes mellitus, type 2 ([place])   . H/O: hysterectomy 1973   fibroids  . Hypertension   . Left arm pain   . Lumbago   . Melanoma ([place])    left arm, resected, annual CXR's ordered  . Syncope 08/2013   Suspected vagal-mediated    Past Surgical History:  Procedure Laterality Date  . ABDOMINAL HYSTERECTOMY  1973   fibroid uterus  . BREAST BIOPSY  1985   normal  . JOINT REPLACEMENT   2003   Left Hip  . TOTAL HIP ARTHROPLASTY  2003   left, Dr. Durward Fortes  . TRANSTHORACIC ECHOCARDIOGRAM  08/23/13   EF 60-65%, borderline concentric LVH, impaired LV diastolic relaxation, LA ULN    Social History:  reports that she has never smoked. She has never used smokeless tobacco.  She reports that she does not drink alcohol or use drugs.  No Known Allergies  Family History  Problem Relation Age of Onset  . Diabetes Mother   . Heart disease Mother        CHF  . Heart disease Father   . Diabetes Father     Prior to Admission medications   Medication Sig Start Date End Date Taking? Authorizing Provider  albuterol (PROVENTIL HFA;VENTOLIN HFA) 108 (90 Base) MCG/ACT inhaler Inhale 1 puff into the lungs every 6 (six) hours as needed for wheezing or shortness of breath.   Yes [provider]  ALPRAZolam [name]) 0.5 MG tablet Take 1 tablet (0.5 mg total) by mouth daily as needed for anxiety. 05/13/18  Yes Johnson, Clanford L, MD  atorvastatin (LIPITOR) 40 MG tablet TAKE ONE TABLET BY MOUTH EVERY DAY. Patient taking differently: Take 40 mg by mouth every evening.  06/12/14  Yes [name], MD  guaiFENesin (MUCINEX) 600 MG 12 hr tablet Take 1 tablet (600 mg total) by mouth 2 (two) times daily for 30 days. 09/10/18 10/10/18 Yes Alveria Apley, PA-C  Insulin Glargine (LANTUS SOLOSTAR) 100 UNIT/ML Solostar Pen Inject 25 Units into the skin 2 (two) times daily. 05/09/18  Yes Kathie Dike, MD  loratadine (CLARITIN) 10 MG tablet Take 1 tablet (10 mg total) by mouth daily. 05/09/18 05/09/19 Yes  Kathie Dike, MD  QUEtiapine (SEROQUEL) 25 MG tablet Take 25 mg by mouth 2 (two) times daily.   Yes [provider]  sertraline (ZOLOFT) 50 MG tablet Take 50 mg by mouth daily.   Yes [provider]  tiotropium (SPIRIVA HANDIHALER) 18 MCG inhalation capsule Place 1 capsule (18 mcg total) into inhaler and inhale daily. Patient not taking: Reported on 09/16/2018 05/09/18 05/09/19  Kathie Dike, MD    Physical Exam: Vitals:   09/16/18 1230 09/16/18 1300 09/16/18 1330 09/16/18 1335  BP: (!) 147/62 (!) 146/67 (!) 148/65   Pulse: 86 91 90   Resp: (!) 32 (!) 29 (!) 29   Temp:    (!) 101.1 F (38.4 C)  TempSrc:    Rectal  SpO2: 92% 92% 93%   Weight:        Height:        Constitutional: NAD, calm, comfortable Eyes: PERRL, lids and conjunctivae normal ENMT: Mucous membranes are moist. Posterior pharynx clear of any exudate or lesions.Normal dentition.  Neck: normal, supple, no masses, no thyromegaly Respiratory: Occasional rhonchi bilaterally. Normal respiratory effort. No accessory muscle use.  Cardiovascular: Regular rate and rhythm, no murmurs / rubs / gallops. No extremity edema. 2+ pedal pulses. No carotid bruits.  Abdomen: no tenderness, no masses palpated. No hepatosplenomegaly. Bowel sounds positive.  Musculoskeletal: no clubbing / cyanosis. No joint deformity upper and lower extremities. Good ROM, no contractures. Normal muscle tone.  Skin: no rashes, lesions, ulcers. No induration Neurologic: CN 2-12 grossly intact. Sensation intact, DTR normal. Strength 5/5 in all 4.  Psychiatric: Normal judgment and insight. Alert and oriented x 3. Normal mood.    Labs on Admission: I have personally reviewed following labs and imaging studies  CBC: Recent Labs  Lab 09/10/18 0806 09/16/18 1057  WBC 6.3 16.9*  NEUTROABS 4.6 14.1*  HGB 12.4 11.7*  HCT 39.9 36.1  MCV 92.4 88.9  PLT 174 086   Basic Metabolic Panel: Recent Labs  Lab 09/10/18 0806 09/16/18 1057  NA 134* 131*  K 4.0 3.3*  CL 100 98  CO2 28 23  GLUCOSE 150* 186*  BUN 20 22  CREATININE 0.91 1.19*  CALCIUM 8.2* 8.0*   GFR: Estimated Creatinine Clearance: 35.9 mL/min (A) (by C-G formula based on SCr of 1.19 mg/dL (H)). Liver Function Tests: Recent Labs  Lab 09/16/18 1057  AST 25  ALT 20  ALKPHOS 65  BILITOT 0.8  PROT 7.7  ALBUMIN 2.9*   No results for input(s): LIPASE, AMYLASE in the last 168 hours. No results for input(s): AMMONIA in the last 168 hours. Coagulation Profile: Recent Labs  Lab 09/16/18 1057  INR 1.22   Cardiac Enzymes: No results for input(s): CKTOTAL, CKMB, CKMBINDEX, TROPONINI in the last 168 hours. BNP (last 3 results) No results  for input(s): PROBNP in the last 8760 hours. HbA1C: No results for input(s): HGBA1C in the last 72 hours. CBG: Recent Labs  Lab 09/10/18 0642 09/10/18 0751  GLUCAP 61* 132*   Lipid Profile: No results for input(s): CHOL, HDL, LDLCALC, TRIG, CHOLHDL, LDLDIRECT in the last 72 hours. Thyroid Function Tests: No results for input(s): TSH, T4TOTAL, FREET4, T3FREE, THYROIDAB in the last 72 hours. Anemia Panel: No results for input(s): VITAMINB12, FOLATE, FERRITIN, TIBC, IRON, RETICCTPCT in the last 72 hours. Urine analysis:    Component Value Date/Time   COLORURINE YELLOW 09/10/2018 0926   APPEARANCEUR CLEAR 09/10/2018 0926   APPEARANCEUR Clear 03/05/2014 2034   LABSPEC 1.009 09/10/2018 5784  LABSPEC 1.010 03/05/2014 2034   PHURINE 6.0 09/10/2018 0926   GLUCOSEU NEGATIVE 09/10/2018 0926   GLUCOSEU Negative 03/05/2014 2034   HGBUR NEGATIVE 09/10/2018 0926   BILIRUBINUR NEGATIVE 09/10/2018 0926   BILIRUBINUR Negative 03/05/2014 2034   KETONESUR NEGATIVE 09/10/2018 0926   PROTEINUR NEGATIVE 09/10/2018 0926   UROBILINOGEN 0.2 06/13/2013 1148   NITRITE NEGATIVE 09/10/2018 0926   LEUKOCYTESUR NEGATIVE 09/10/2018 0926   LEUKOCYTESUR 1+ 03/05/2014 2034    Radiological Exams on Admission: Dg Chest 2 View  Result Date: 09/16/2018 CLINICAL DATA:  Shortness of breath. EXAM: CHEST - 2 VIEW COMPARISON:  09/13/2018.  09/10/2018. FINDINGS: Heart size normal. Pulmonary vascularity is normal. Mild bibasilar atelectasis/infiltrates. Similar findings noted on prior exam. Small bilateral pleural effusions. No pneumothorax. IMPRESSION: Mild bibasilar atelectasis/infiltrates. Similar findings noted on prior exam. Electronically Signed   By: [place]   On: 09/16/2018 11:42    EKG: Independently reviewed. Sinus rhythm without acute changes  Assessment/Plan Active Problems:   Hypertension   Diabetes mellitus, type 2 ([facility])   COPD (chronic obstructive pulmonary disease) ([place])   HCAP  (healthcare-associated pneumonia)     1. HCAP.  Continue on broad-spectrum antibiotics.  Blood cultures been sent.  Check sputum culture. 2. COPD.  No wheezing at this time.  Continue on bronchodilators, mucolytic's. 3. Diabetes.  Will continue on basal insulin.  Supplement sliding scale. 4. Hyperlipidemia.  Continue statin 5. Elevated blood pressure.  She is not on any chronic medications.  Likely elevated in the setting of acute illness.  We will continue to monitor.  DVT prophylaxis: SCDs Code Status: DNR Family Communication: No family present Disposition Plan: Return to ALF on discharge Consults called:   Admission status: Observation, MedSurg  Kathie Dike MD [facility]   If 7PM-7AM, please contact night-coverage [email or web address]   09/16/2018, 4:02 PM

## 2018-09-16 NOTE — ED Provider Notes (Addendum)
[facility] EMERGENCY DEPARTMENT Provider Note   CSN: [id number] Arrival date & time: 09/16/18  1021     History   Chief Complaint Chief Complaint  Patient presents with  . Shortness of Breath    HPI Sheena [name] is a 85 y.o. female.  The history is provided by the patient. No language interpreter was used.  Shortness of Breath  Severity:  Moderate Onset quality:  Gradual Duration:  1 day Timing:  Constant Progression:  Worsening Chronicity:  New Relieved by:  Nothing Worsened by:  Nothing Ineffective treatments:  None tried Associated symptoms: fever and sore throat    Pt complains of a cough and sore throat.  Past Medical History:  Diagnosis Date  . Asthma    treated by Dr. Donneta Romberg  . Depression   . Diabetes mellitus, type 2 ([place])   . H/O: hysterectomy 1973   fibroids  . Hypertension   . Left arm pain   . Lumbago   . Melanoma ([place])    left arm, resected, annual CXR's ordered  . Syncope 08/2013   Suspected vagal-mediated    Patient Active Problem List   Diagnosis Date Noted  . Syncope 05/11/2018  . COPD (chronic obstructive pulmonary disease) ([place]) 05/11/2018  . Acute respiratory failure with hypoxia ([place]) 05/07/2018  . Hypertension   . Diabetes mellitus, type 2 ([place])   . Cholelithiasis 12/11/2016  . Abdominal pain 12/11/2016  . Back pain, thoracic 10/14/2013  . Mild cognitive impairment with memory loss 10/04/2013  . Syncope and collapse 08/26/2013  . Other and unspecified hyperlipidemia 05/14/2013  . Unspecified constipation 05/14/2013  . Osteoarthritis of left hip 01/11/2013  . History of syncope 11/15/2012  . Ankle fracture, left 11/15/2012  . H/O acute renal failure 11/15/2012  . Proximal muscle weakness 10/30/2012  . Neurotic excoriations 09/02/2012  . Loss of balance 06/06/2012  . Melanoma ([place])   . Asthma   . Insomnia, persistent 09/01/2011  . Severe major depression with psychotic features ([place]) 09/01/2011  . Obesity (BMI 30-39.9)  09/01/2011  . Obesity, diabetes, and hypertension syndrome ([place]) 09/01/2011  . COPD with acute exacerbation ([place]) 01/13/2008    Past Surgical History:  Procedure Laterality Date  . ABDOMINAL HYSTERECTOMY  1973   fibroid uterus  . BREAST BIOPSY  1985   normal  . JOINT REPLACEMENT   2003   Left Hip  . TOTAL HIP ARTHROPLASTY  2003   left, Dr. Durward Fortes  . TRANSTHORACIC ECHOCARDIOGRAM  08/23/13   EF 60-65%, borderline concentric LVH, impaired LV diastolic relaxation, LA ULN     OB History   No obstetric history on file.      Home Medications    Prior to Admission medications   Medication Sig Start Date End Date Taking? Authorizing Provider  albuterol (PROVENTIL HFA;VENTOLIN HFA) 108 (90 Base) MCG/ACT inhaler Inhale 1 puff into the lungs every 6 (six) hours as needed for wheezing or shortness of breath.   Yes [provider]  ALPRAZolam [name]) 0.5 MG tablet Take 1 tablet (0.5 mg total) by mouth daily as needed for anxiety. 05/13/18  Yes Johnson, Clanford L, MD  atorvastatin (LIPITOR) 40 MG tablet TAKE ONE TABLET BY MOUTH EVERY DAY. Patient taking differently: Take 40 mg by mouth every evening.  06/12/14  Yes [name], MD  guaiFENesin (MUCINEX) 600 MG 12 hr tablet Take 1 tablet (600 mg total) by mouth 2 (two) times daily for 30 days. 09/10/18 10/10/18 Yes Alveria Apley, PA-C  Insulin  Glargine (LANTUS SOLOSTAR) 100 UNIT/ML Solostar Pen Inject 25 Units into the skin 2 (two) times daily. 05/09/18  Yes Kathie Dike, MD  loratadine (CLARITIN) 10 MG tablet Take 1 tablet (10 mg total) by mouth daily. 05/09/18 05/09/19 Yes Kathie Dike, MD  QUEtiapine (SEROQUEL) 25 MG tablet Take 25 mg by mouth 2 (two) times daily.   Yes [provider]  sertraline (ZOLOFT) 50 MG tablet Take 50 mg by mouth daily.   Yes [provider]  tiotropium (SPIRIVA HANDIHALER) 18 MCG inhalation capsule Place 1 capsule (18 mcg total) into inhaler and inhale daily. Patient not taking:  Reported on 09/16/2018 05/09/18 05/09/19  Kathie Dike, MD    Family History Family History  Problem Relation Age of Onset  . Diabetes Mother   . Heart disease Mother        CHF  . Heart disease Father   . Diabetes Father     Social History Social History   Tobacco Use  . Smoking status: Never Smoker  . Smokeless tobacco: Never Used  Substance Use Topics  . Alcohol use: No    Comment: occasional  . Drug use: No     Allergies   Patient has no known allergies.   Review of Systems Review of Systems  Constitutional: Positive for fever.  HENT: Positive for sore throat.   Respiratory: Positive for shortness of breath.   All other systems reviewed and are negative.    Physical Exam Updated Vital Signs BP (!) 134/53   Pulse 92   Temp 100.1 F (37.8 C) (Oral)   Resp (!) 27   Ht 5\' 5"  (1.651 m)   Wt 103 kg   SpO2 93%   BMI 37.77 kg/m   Physical Exam Vitals signs and nursing note reviewed.  Constitutional:      Appearance: She is well-developed.  HENT:     Head: Normocephalic.     Mouth/Throat:     Mouth: Mucous membranes are moist.  Eyes:     Pupils: Pupils are equal, round, and reactive to light.  Neck:     Musculoskeletal: Normal range of motion.  Cardiovascular:     Rate and Rhythm: Normal rate.  Pulmonary:     Effort: Pulmonary effort is normal.     Breath sounds: Decreased breath sounds and rhonchi present. No wheezing.  Chest:     Chest wall: No tenderness.  Abdominal:     Palpations: Abdomen is soft.  Musculoskeletal: Normal range of motion.  Skin:    General: Skin is warm.  Neurological:     General: No focal deficit present.     Mental Status: She is alert.  Psychiatric:        Mood and Affect: Mood normal.      ED Treatments / Results  Labs (all labs ordered are listed, but only abnormal results are displayed) Labs Reviewed  CBC WITH DIFFERENTIAL/PLATELET - Abnormal; Notable for the following components:      Result Value   WBC  16.9 (*)    Hemoglobin 11.7 (*)    Neutro Abs 14.1 (*)    Monocytes Absolute 1.8 (*)    Abs Immature Granulocytes 0.15 (*)    All other components within normal limits  COMPREHENSIVE METABOLIC PANEL - Abnormal; Notable for the following components:   Sodium 131 (*)    Potassium 3.3 (*)    Glucose, Bld 186 (*)    Creatinine, Ser 1.19 (*)    Calcium 8.0 (*)  Albumin 2.9 (*)    GFR calc non Af Amer 40 (*)    GFR calc Af Amer 46 (*)    All other components within normal limits  PROTIME-INR - Abnormal; Notable for the following components:   Prothrombin Time 15.3 (*)    All other components within normal limits  APTT - Abnormal; Notable for the following components:   aPTT 58 (*)    All other components within normal limits  CULTURE, BLOOD (ROUTINE X 2)  CULTURE, BLOOD (ROUTINE X 2)  INFLUENZA PANEL BY PCR (TYPE A & B)  LACTIC ACID, PLASMA  PROCALCITONIN  URINALYSIS, ROUTINE W REFLEX MICROSCOPIC  LACTIC ACID, PLASMA    EKG EKG Interpretation  Date/Time:  Friday September 16 2018 10:33:28 EST Ventricular Rate:  97 PR Interval:    QRS Duration: 86 QT Interval:  340 QTC Calculation: 432 R Axis:   2 Text Interpretation:  Sinus rhythm No significant change since last tracing Confirmed by Lajean Saver [phone]) on 09/16/2018 10:47:45 AM   Radiology Dg Chest 2 View  Result Date: 09/16/2018 CLINICAL DATA:  Shortness of breath. EXAM: CHEST - 2 VIEW COMPARISON:  09/13/2018.  09/10/2018. FINDINGS: Heart size normal. Pulmonary vascularity is normal. Mild bibasilar atelectasis/infiltrates. Similar findings noted on prior exam. Small bilateral pleural effusions. No pneumothorax. IMPRESSION: Mild bibasilar atelectasis/infiltrates. Similar findings noted on prior exam. Electronically Signed   By: [place]   On: 09/16/2018 11:42    Procedures .Critical Care Performed by: Fransico Meadow, PA-C Authorized by: Fransico Meadow, PA-C   Critical care provider statement:    Critical  care time (minutes):  45   Critical care start time:  09/16/2018 10:45 AM   Critical care end time:  09/16/2018 1:00 PM   Critical care time was exclusive of:  Separately billable procedures and treating other patients   Critical care was necessary to treat or prevent imminent or life-threatening deterioration of the following conditions:  Renal failure   Critical care was time spent personally by me on the following activities:  Discussions with consultants, evaluation of patient's response to treatment, examination of patient, ordering and performing treatments and interventions, ordering and review of laboratory studies, ordering and review of radiographic studies, pulse oximetry, re-evaluation of patient's condition, obtaining history from patient or surrogate and review of old charts   (including critical care time)  Medications Ordered in ED Medications  sodium chloride 0.9 % bolus 500 mL (500 mLs Intravenous New Bag/Given 09/16/18 1120)  cefTRIAXone (ROCEPHIN) 1 g in sodium chloride 0.9 % 100 mL IVPB (has no administration in time range)  azithromycin (ZITHROMAX) 500 mg in sodium chloride 0.9 % 250 mL IVPB (has no administration in time range)     Initial Impression / Assessment and Plan / ED Course  I have reviewed the triage vital signs and the nursing notes.  Pertinent labs & imaging results that were available during my care of the patient were reviewed by me and considered in my medical decision making (see chart for details).     ED course. Pt  Short of breath on evaluation.  )@ sats 92.  Pt placed on 02 at 2 liters.  Pt reports it is easier to breath.  Pt given Iv fluids x 1 liter,  Labs returned Pt has elevated wbc count of 16.  Chest xray shows bibasilar atelectasis.  Influenza is negative.   Dr. Ashok Cordia in to see and examine.  Iv Rocephin and zithromax given.    I  spoke to the hospitalist who will see for admission.   Final Clinical Impressions(s) / ED Diagnoses   Final  diagnoses:  Community acquired pneumonia, unspecified laterality    ED Discharge Orders    None       Sidney Ace 09/16/18 1422    Lajean Saver, MD 09/16/18 [address] [place] 09/29/18 0340    Lajean Saver, MD 09/29/18 [phone]

## 2018-09-16 NOTE — ED Triage Notes (Signed)
Pt from [facility].  Pt c/o of sob.  Oxygen 92%.  Pt was prescribed inhaler 09/12/18 and facility never gave it to her

## 2018-09-17 DIAGNOSIS — J189 Pneumonia, unspecified organism: Secondary | ICD-10-CM | POA: Diagnosis not present

## 2018-09-17 DIAGNOSIS — Z8249 Family history of ischemic heart disease and other diseases of the circulatory system: Secondary | ICD-10-CM | POA: Diagnosis not present

## 2018-09-17 DIAGNOSIS — E876 Hypokalemia: Secondary | ICD-10-CM | POA: Diagnosis present

## 2018-09-17 DIAGNOSIS — J181 Lobar pneumonia, unspecified organism: Secondary | ICD-10-CM | POA: Diagnosis present

## 2018-09-17 DIAGNOSIS — E785 Hyperlipidemia, unspecified: Secondary | ICD-10-CM | POA: Diagnosis present

## 2018-09-17 DIAGNOSIS — Z794 Long term (current) use of insulin: Secondary | ICD-10-CM | POA: Diagnosis not present

## 2018-09-17 DIAGNOSIS — Z833 Family history of diabetes mellitus: Secondary | ICD-10-CM | POA: Diagnosis not present

## 2018-09-17 DIAGNOSIS — E119 Type 2 diabetes mellitus without complications: Secondary | ICD-10-CM | POA: Diagnosis not present

## 2018-09-17 DIAGNOSIS — J449 Chronic obstructive pulmonary disease, unspecified: Secondary | ICD-10-CM | POA: Diagnosis not present

## 2018-09-17 DIAGNOSIS — Z90710 Acquired absence of both cervix and uterus: Secondary | ICD-10-CM | POA: Diagnosis not present

## 2018-09-17 DIAGNOSIS — Z9981 Dependence on supplemental oxygen: Secondary | ICD-10-CM | POA: Diagnosis not present

## 2018-09-17 DIAGNOSIS — I10 Essential (primary) hypertension: Secondary | ICD-10-CM | POA: Diagnosis not present

## 2018-09-17 DIAGNOSIS — Z85820 Personal history of malignant melanoma of skin: Secondary | ICD-10-CM | POA: Diagnosis not present

## 2018-09-17 DIAGNOSIS — Z96642 Presence of left artificial hip joint: Secondary | ICD-10-CM | POA: Diagnosis present

## 2018-09-17 DIAGNOSIS — Z79899 Other long term (current) drug therapy: Secondary | ICD-10-CM | POA: Diagnosis not present

## 2018-09-17 DIAGNOSIS — R0602 Shortness of breath: Secondary | ICD-10-CM | POA: Diagnosis present

## 2018-09-17 DIAGNOSIS — J440 Chronic obstructive pulmonary disease with acute lower respiratory infection: Secondary | ICD-10-CM | POA: Diagnosis present

## 2018-09-17 DIAGNOSIS — J9611 Chronic respiratory failure with hypoxia: Secondary | ICD-10-CM | POA: Diagnosis not present

## 2018-09-17 DIAGNOSIS — J9621 Acute and chronic respiratory failure with hypoxia: Secondary | ICD-10-CM | POA: Diagnosis present

## 2018-09-17 DIAGNOSIS — Z66 Do not resuscitate: Secondary | ICD-10-CM | POA: Diagnosis present

## 2018-09-17 DIAGNOSIS — J441 Chronic obstructive pulmonary disease with (acute) exacerbation: Secondary | ICD-10-CM | POA: Diagnosis not present

## 2018-09-17 LAB — URINALYSIS, ROUTINE W REFLEX MICROSCOPIC
BILIRUBIN URINE: NEGATIVE
Glucose, UA: NEGATIVE mg/dL
HGB URINE DIPSTICK: NEGATIVE
Ketones, ur: NEGATIVE mg/dL
Leukocytes, UA: NEGATIVE
NITRITE: NEGATIVE
PH: 6 (ref 5.0–8.0)
Protein, ur: NEGATIVE mg/dL
SPECIFIC GRAVITY, URINE: 1.02 (ref 1.005–1.030)

## 2018-09-17 LAB — CBC
HCT: 30.2 % — ABNORMAL LOW (ref 36.0–46.0)
Hemoglobin: 9.9 g/dL — ABNORMAL LOW (ref 12.0–15.0)
MCH: 29.5 pg (ref 26.0–34.0)
MCHC: 32.8 g/dL (ref 30.0–36.0)
MCV: 89.9 fL (ref 80.0–100.0)
PLATELETS: 213 10*3/uL (ref 150–400)
RBC: 3.36 MIL/uL — ABNORMAL LOW (ref 3.87–5.11)
RDW: 13.8 % (ref 11.5–15.5)
WBC: 15.6 10*3/uL — ABNORMAL HIGH (ref 4.0–10.5)
nRBC: 0 % (ref 0.0–0.2)

## 2018-09-17 LAB — GLUCOSE, CAPILLARY
GLUCOSE-CAPILLARY: 191 mg/dL — AB (ref 70–99)
GLUCOSE-CAPILLARY: 212 mg/dL — AB (ref 70–99)
Glucose-Capillary: 80 mg/dL (ref 70–99)
Glucose-Capillary: 151 mg/dL — ABNORMAL HIGH (ref 70–99)
Glucose-Capillary: 167 mg/dL — ABNORMAL HIGH (ref 70–99)

## 2018-09-17 LAB — BASIC METABOLIC PANEL
Anion gap: 9 (ref 5–15)
BUN: 16 mg/dL (ref 8–23)
CO2: 22 mmol/L (ref 22–32)
Calcium: 7.4 mg/dL — ABNORMAL LOW (ref 8.9–10.3)
Chloride: 103 mmol/L (ref 98–111)
Creatinine, Ser: 0.96 mg/dL (ref 0.44–1.00)
GFR calc Af Amer: 60 mL/min — ABNORMAL LOW (ref >60)
GFR calc non Af Amer: 51 mL/min — ABNORMAL LOW (ref >60)
GLUCOSE: 106 mg/dL — AB (ref 70–99)
Potassium: 3 mmol/L — ABNORMAL LOW (ref 3.5–5.1)
Sodium: 134 mmol/L — ABNORMAL LOW (ref 135–145)

## 2018-09-17 LAB — MRSA PCR SCREENING: MRSA by PCR: NEGATIVE

## 2018-09-17 LAB — STREP PNEUMONIAE URINARY ANTIGEN: Strep Pneumo Urinary Antigen: NEGATIVE

## 2018-09-17 MED ORDER — POTASSIUM CHLORIDE 10 MEQ/100ML IV SOLN
10.0000 meq | INTRAVENOUS | Status: AC
  Administered 2018-09-17 – 2018-09-18 (×4): 10 meq via INTRAVENOUS
  Filled 2018-09-17 (×4): qty 100

## 2018-09-17 MED ORDER — AMLODIPINE BESYLATE 5 MG PO TABS
5.0000 mg | ORAL_TABLET | Freq: Every day | ORAL | Status: DC
  Administered 2018-09-17 – 2018-09-19 (×3): 5 mg via ORAL
  Filled 2018-09-17 (×3): qty 1

## 2018-09-17 MED ORDER — POTASSIUM CHLORIDE 10 MEQ/100ML IV SOLN: 10.0000 meq | INTRAVENOUS | Status: DC

## 2018-09-17 MED ORDER — ORAL CARE MOUTH RINSE
15.0000 mL | Freq: Two times a day (BID) | OROMUCOSAL | Status: DC
  Administered 2018-09-17 – 2018-09-19 (×6): 15 mL via OROMUCOSAL

## 2018-09-17 MED ORDER — INSULIN GLARGINE 100 UNIT/ML ~~LOC~~ SOLN
25.0000 [IU] | Freq: Two times a day (BID) | SUBCUTANEOUS | Status: DC
  Administered 2018-09-17 – 2018-09-19 (×5): 25 [IU] via SUBCUTANEOUS
  Filled 2018-09-17 (×7): qty 0.25

## 2018-09-17 MED ORDER — METHYLPREDNISOLONE SODIUM SUCC 125 MG IJ SOLR
60.0000 mg | Freq: Two times a day (BID) | INTRAMUSCULAR | Status: DC
  Administered 2018-09-17 – 2018-09-18 (×2): 60 mg via INTRAVENOUS
  Filled 2018-09-17 (×3): qty 2

## 2018-09-17 NOTE — Progress Notes (Signed)
PROGRESS NOTE    Sheena [name]  GEX:[id number] DOB: 24-May-1924 DOA: 09/16/2018 PCP: Lorene Dy, MD    Brief Narrative:  85 year old female who is a resident of an assisted living facility, with a history of COPD, diabetes and hypertension, presents to the hospital with cough and shortness of breath.  Found to have healthcare associated pneumonia and COPD exacerbation.  She was admitted for further treatments.   Assessment & Plan:   Active Problems:   Hypertension   Diabetes mellitus, type 2 (HCC)   COPD (chronic obstructive pulmonary disease) ([place])   HCAP (healthcare-associated pneumonia)   1. HCAP.  Currently on vancomycin and cefepime.  Blood cultures show no growth.  Continue current treatments. 2. COPD exacerbation.  Patient has developed wheezing.  Continue on bronchodilators, antibiotics.  Will start intravenous steroids. 3. Diabetes.  Continued on basal insulin.  Stable on sliding scale insulin.  Blood sugars have been stable. 4. Hyperlipidemia.  Continue statin 5. Hypertension.  Blood pressures remain elevated.  Started on Norvasc. 6. Hypokalemia.  Replace.  Check magnesium   DVT prophylaxis: SCDs Code Status: DNR Family Communication: No family present Disposition Plan: Return to [facility] once respiratory status has stabilized   Consultants:     Procedures:     Antimicrobials:   Cefepime 2/7 >  Vancomycin 2/7.     Subjective: Continues to feel short of breath, has nonproductive cough and has started wheezing  Objective: Vitals:   09/17/18 0545 09/17/18 1041 09/17/18 1513 09/17/18 1524  BP: (!) 157/64  (!) 167/66   Pulse: 95  93   Resp: (!) 24  18   Temp: 98.9 F (37.2 C)  99.4 F (37.4 C)   TempSrc: Oral  Oral   SpO2: 92% 94% 92% 94%  Weight:      Height:        Intake/Output Summary (Last 24 hours) at 09/17/2018 1858 Last data filed at 09/17/2018 1700 Gross per 24 hour  Intake 240 ml  Output 400 ml  Net -160 ml   Filed Weights     09/16/18 1024 09/17/18 0023  Weight: 103 kg 96.6 kg    Examination:  General exam: Appears calm and comfortable  Respiratory system: Bilateral wheezing. Respiratory effort normal. Cardiovascular system: S1 & S2 heard, RRR. No JVD, murmurs, rubs, gallops or clicks. No pedal edema. Gastrointestinal system: Abdomen is nondistended, soft and nontender. No organomegaly or masses felt. Normal bowel sounds heard. Central nervous system: Alert and oriented. No focal neurological deficits. Extremities: Symmetric 5 x 5 power. Skin: No rashes, lesions or ulcers Psychiatry: Judgement and insight appear normal. Mood & affect appropriate.     Data Reviewed: I have personally reviewed following labs and imaging studies  CBC: Recent Labs  Lab 09/16/18 1057 09/17/18 0640  WBC 16.9* 15.6*  NEUTROABS 14.1*  --   HGB 11.7* 9.9*  HCT 36.1 30.2*  MCV 88.9 89.9  PLT 225 010   Basic Metabolic Panel: Recent Labs  Lab 09/16/18 1057 09/17/18 0640  NA 131* 134*  K 3.3* 3.0*  CL 98 103  CO2 23 22  GLUCOSE 186* 106*  BUN 22 16  CREATININE 1.19* 0.96  CALCIUM 8.0* 7.4*   GFR: Estimated Creatinine Clearance: 43 mL/min (by C-G formula based on SCr of 0.96 mg/dL). Liver Function Tests: Recent Labs  Lab 09/16/18 1057  AST 25  ALT 20  ALKPHOS 65  BILITOT 0.8  PROT 7.7  ALBUMIN 2.9*   No results for input(s): LIPASE, AMYLASE in  the last 168 hours. No results for input(s): AMMONIA in the last 168 hours. Coagulation Profile: Recent Labs  Lab 09/16/18 1057  INR 1.22   Cardiac Enzymes: No results for input(s): CKTOTAL, CKMB, CKMBINDEX, TROPONINI in the last 168 hours. BNP (last 3 results) No results for input(s): PROBNP in the last 8760 hours. HbA1C: No results for input(s): HGBA1C in the last 72 hours. CBG: Recent Labs  Lab 09/16/18 2156 09/17/18 0028 09/17/18 0748 09/17/18 1114 09/17/18 1731  GLUCAP 127* 191* 80 151* 167*   Lipid Profile: No results for input(s): CHOL,  HDL, LDLCALC, TRIG, CHOLHDL, LDLDIRECT in the last 72 hours. Thyroid Function Tests: No results for input(s): TSH, T4TOTAL, FREET4, T3FREE, THYROIDAB in the last 72 hours. Anemia Panel: No results for input(s): VITAMINB12, FOLATE, FERRITIN, TIBC, IRON, RETICCTPCT in the last 72 hours. Sepsis Labs: Recent Labs  Lab 09/16/18 1057  PROCALCITON 0.27  LATICACIDVEN 1.3    Recent Results (from the past 240 hour(s))  Culture, blood (x 2)     Status: None (Preliminary result)   Collection Time: 09/16/18 10:59 AM  Result Value Ref Range Status   Specimen Description BLOOD RIGHT ARM  Final   Special Requests   Final    BOTTLES DRAWN AEROBIC AND ANAEROBIC Blood Culture adequate volume   Culture   Final    NO GROWTH < 24 HOURS Performed at [facility], [address] [place], [place] [address]    Report Status PENDING  Incomplete  Culture, blood (x 2)     Status: None (Preliminary result)   Collection Time: 09/16/18 11:05 AM  Result Value Ref Range Status   Specimen Description BLOOD LEFT ARM  Final   Special Requests   Final    BOTTLES DRAWN AEROBIC AND ANAEROBIC Blood Culture adequate volume   Culture   Final    NO GROWTH < 24 HOURS Performed at [facility], [address] [place], [place] [address]    Report Status PENDING  Incomplete  MRSA PCR Screening     Status: None   Collection Time: 09/17/18  2:47 AM  Result Value Ref Range Status   MRSA by PCR NEGATIVE NEGATIVE Final    Comment:        The GeneXpert MRSA Assay (FDA approved for NASAL specimens only), is one component of a comprehensive MRSA colonization surveillance program. It is not intended to diagnose MRSA infection nor to guide or monitor treatment for MRSA infections. Performed at [facility], [address] [place], [place] [address]          Radiology Studies: Dg Chest 2 View  Result Date: 09/16/2018 CLINICAL DATA:  Shortness of breath. EXAM: CHEST - 2 VIEW COMPARISON:  09/13/2018.  09/10/2018.  FINDINGS: Heart size normal. Pulmonary vascularity is normal. Mild bibasilar atelectasis/infiltrates. Similar findings noted on prior exam. Small bilateral pleural effusions. No pneumothorax. IMPRESSION: Mild bibasilar atelectasis/infiltrates. Similar findings noted on prior exam. Electronically Signed   By: [place]   On: 09/16/2018 11:42        Scheduled Meds: . atorvastatin  40 mg Oral QPM  . guaiFENesin  600 mg Oral BID  . insulin aspart  0-15 Units Subcutaneous TID WC  . insulin aspart  0-5 Units Subcutaneous QHS  . insulin glargine  25 Units Subcutaneous BID  . ipratropium-albuterol  3 mL Nebulization Q6H  . loratadine  10 mg Oral Daily  . mouth rinse  15 mL Mouth Rinse BID  . methylPREDNISolone (SOLU-MEDROL) injection  60 mg  Intravenous Q12H  . QUEtiapine  25 mg Oral BID  . sertraline  50 mg Oral Daily   Continuous Infusions: . ceFEPime (MAXIPIME) IV 1 g (09/17/18 1800)  . potassium chloride    . vancomycin       LOS: 0 days    Time spent: 53mins    Kathie Dike, MD [facility]   If 7PM-7AM, please contact night-coverage [email or web address]  09/17/2018, 6:58 PM

## 2018-09-17 NOTE — Plan of Care (Signed)
Patient remains alert and oriented x 4. She has been progressing and is in agreement with plan of care. All safety precautions remain in place.

## 2018-09-18 DIAGNOSIS — J441 Chronic obstructive pulmonary disease with (acute) exacerbation: Secondary | ICD-10-CM

## 2018-09-18 LAB — BASIC METABOLIC PANEL
Anion gap: 7 (ref 5–15)
BUN: 16 mg/dL (ref 8–23)
CO2: 22 mmol/L (ref 22–32)
Calcium: 7.8 mg/dL — ABNORMAL LOW (ref 8.9–10.3)
Chloride: 106 mmol/L (ref 98–111)
Creatinine, Ser: 0.9 mg/dL (ref 0.44–1.00)
GFR calc Af Amer: >60 mL/min (ref >60)
GFR calc non Af Amer: 56 mL/min — ABNORMAL LOW (ref >60)
Glucose, Bld: 208 mg/dL — ABNORMAL HIGH (ref 70–99)
Potassium: 4.2 mmol/L (ref 3.5–5.1)
Sodium: 135 mmol/L (ref 135–145)

## 2018-09-18 LAB — CBC
HCT: 31.9 % — ABNORMAL LOW (ref 36.0–46.0)
HEMOGLOBIN: 10.2 g/dL — AB (ref 12.0–15.0)
MCH: 29.1 pg (ref 26.0–34.0)
MCHC: 32 g/dL (ref 30.0–36.0)
MCV: 90.9 fL (ref 80.0–100.0)
Platelets: 205 10*3/uL (ref 150–400)
RBC: 3.51 MIL/uL — AB (ref 3.87–5.11)
RDW: 13.8 % (ref 11.5–15.5)
WBC: 10.3 10*3/uL (ref 4.0–10.5)
nRBC: 0 % (ref 0.0–0.2)

## 2018-09-18 LAB — HIV ANTIBODY (ROUTINE TESTING W REFLEX): HIV Screen 4th Generation wRfx: NONREACTIVE

## 2018-09-18 LAB — GLUCOSE, CAPILLARY
Glucose-Capillary: 185 mg/dL — ABNORMAL HIGH (ref 70–99)
Glucose-Capillary: 218 mg/dL — ABNORMAL HIGH (ref 70–99)
Glucose-Capillary: 254 mg/dL — ABNORMAL HIGH (ref 70–99)
Glucose-Capillary: 223 mg/dL — ABNORMAL HIGH (ref 70–99)

## 2018-09-18 LAB — MAGNESIUM: Magnesium: 1.9 mg/dL (ref 1.7–2.4)

## 2018-09-18 MED ORDER — ALBUTEROL SULFATE (2.5 MG/3ML) 0.083% IN NEBU: 2.5000 mg | INHALATION_SOLUTION | Freq: Four times a day (QID) | RESPIRATORY_TRACT | 12 refills | Status: DC

## 2018-09-18 MED ORDER — PREDNISONE 10 MG PO TABS: ORAL_TABLET | ORAL | 0 refills | Status: DC

## 2018-09-18 MED ORDER — AMLODIPINE BESYLATE 5 MG PO TABS: 5.0000 mg | ORAL_TABLET | Freq: Every day | ORAL | Status: DC

## 2018-09-18 MED ORDER — AMOXICILLIN-POT CLAVULANATE 875-125 MG PO TABS: 1.0000 | ORAL_TABLET | Freq: Two times a day (BID) | ORAL | 0 refills | Status: AC

## 2018-09-18 MED ORDER — PREDNISONE 20 MG PO TABS
40.0000 mg | ORAL_TABLET | Freq: Every day | ORAL | Status: DC
  Administered 2018-09-19: 40 mg via ORAL
  Filled 2018-09-18: qty 2

## 2018-09-18 NOTE — Discharge Summary (Addendum)
Physician Discharge Summary  Sheena BOISSELLE IRS:[id number] DOB: [birth year] DOA: 09/16/2018  PCP: Lorene Dy, MD  Admit date: 09/16/2018 Discharge date: 09/18/2018  Admitted From: Assisted living facility Disposition: Assisted living facility  Recommendations for Outpatient Follow-up:  1. Follow up with PCP in 1-2 weeks 2. Please obtain BMP/CBC in one week  Home Health: Equipment/Devices: Nebulizer  Discharge Condition: Stable CODE STATUS: DNR Diet recommendation: Heart healthy, carb modified  Brief/Interim Summary: 85 year old female who is a resident of an assisted living facility, has a history of COPD on 3L oxygen, presented to the hospital with cough and shortness of breath.  She was found to have healthcare associated pneumonia and COPD exacerbation.  She was admitted for further treatments.  Discharge Diagnoses:  Active Problems:   Hypertension   Diabetes mellitus, type 2 (HCC)   COPD (chronic obstructive pulmonary disease) ([place])   HCAP (healthcare-associated pneumonia)  1. HCAP.  Initially treated with vancomycin and cefepime.  She is now afebrile.  Blood cultures showed no growth.  She continues to have some productive cough.  She will be transitioned to Augmentin to complete her antibiotic course. 2. COPD exacerbation.  Overall wheezing has improved.  IV steroids will be changed to prednisone taper.  She will be continued on bronchodilators and mucolytic's. 3. Acute on chronic respiratory failure with hypoxia.  Related to pneumonia and COPD exacerbation.  She has improved with therapy and is now back on her 3 L of oxygen which is her chronic requirement. 4. Diabetes.  Continue on basal insulin.  Blood sugars been stable. 5. Hyperlipidemia.  Continue statin 6. Hypertension.  Blood pressure stable on Norvasc. 7. Hypokalemia.  Replaced.  Magnesium normal  Discharge Instructions  Discharge Instructions    DME Nebulizer machine   Complete by:  As directed    Patient  needs a nebulizer to treat with the following condition:  COPD exacerbation ([place])     Allergies as of 09/18/2018   No Known Allergies     Medication List    TAKE these medications   albuterol 108 (90 Base) MCG/ACT inhaler Commonly known as:  PROVENTIL HFA;VENTOLIN HFA Inhale 1 puff into the lungs every 6 (six) hours as needed for wheezing or shortness of breath. What changed:  Another medication with the same name was added. Make sure you understand how and when to take each.   albuterol (2.5 MG/3ML) 0.083% nebulizer solution Commonly known as:  PROVENTIL Take 3 mLs (2.5 mg total) by nebulization every 6 (six) hours. What changed:  You were already taking a medication with the same name, and this prescription was added. Make sure you understand how and when to take each.   ALPRAZolam 0.5 MG tablet Commonly known as:  XANAX Take 1 tablet (0.5 mg total) by mouth daily as needed for anxiety.   amLODipine 5 MG tablet Commonly known as:  NORVASC Take 1 tablet (5 mg total) by mouth daily. Start taking on:  September 19, 2018   amoxicillin-clavulanate 875-125 MG tablet Commonly known as:  AUGMENTIN Take 1 tablet by mouth 2 (two) times daily for 7 days.   atorvastatin 40 MG tablet Commonly known as:  LIPITOR TAKE ONE TABLET BY MOUTH EVERY DAY. What changed:  when to take this   guaiFENesin 600 MG 12 hr tablet Commonly known as:  MUCINEX Take 1 tablet (600 mg total) by mouth 2 (two) times daily for 30 days.   Insulin Glargine 100 UNIT/ML Solostar Pen Commonly known as:  LANTUS SOLOSTAR Inject 25  Units into the skin 2 (two) times daily.   loratadine 10 MG tablet Commonly known as:  CLARITIN Take 1 tablet (10 mg total) by mouth daily.   predniSONE 10 MG tablet Commonly known as:  DELTASONE Take 40mg  po daily for 2 days then 30mg  daily for 2 days then 20mg  daily for 2 days then 10mg  daily for 2 days then stop   QUEtiapine 25 MG tablet Commonly known as:  SEROQUEL Take 25 mg  by mouth 2 (two) times daily.   sertraline 50 MG tablet Commonly known as:  ZOLOFT Take 50 mg by mouth daily.   tiotropium 18 MCG inhalation capsule Commonly known as:  SPIRIVA HANDIHALER Place 1 capsule (18 mcg total) into inhaler and inhale daily.            Durable Medical Equipment  (From admission, onward)         Start     Ordered   09/18/18 0000  DME Nebulizer machine    Question:  Patient needs a nebulizer to treat with the following condition  Answer:  COPD exacerbation ([place])   09/18/18 1941          No Known Allergies  Consultations:     Procedures/Studies: Dg Chest 1 View  Result Date: 09/10/2018 CLINICAL DATA:  Cough.  Altered mental status. EXAM: CHEST  1 VIEW COMPARISON:  05/26/2018 FINDINGS: The heart is moderately enlarged. Vascular congestion. No sign of interstitial edema. No pneumothorax or pleural effusion. Lungs are under aerated with hypoaeration at the lung bases. IMPRESSION: Cardiomegaly and vascular congestion without pulmonary edema. Electronically Signed   By: Marybelle Killings M.D.   On: 09/10/2018 08:26   Dg Chest 2 View  Result Date: 09/16/2018 CLINICAL DATA:  Shortness of breath. EXAM: CHEST - 2 VIEW COMPARISON:  09/13/2018.  09/10/2018. FINDINGS: Heart size normal. Pulmonary vascularity is normal. Mild bibasilar atelectasis/infiltrates. Similar findings noted on prior exam. Small bilateral pleural effusions. No pneumothorax. IMPRESSION: Mild bibasilar atelectasis/infiltrates. Similar findings noted on prior exam. Electronically Signed   By: Marcello Moores  Register   On: 09/16/2018 11:42   Dg Chest 2 View  Result Date: 09/13/2018 CLINICAL DATA:  Difficulty breathing EXAM: CHEST - 2 VIEW COMPARISON:  09/10/2018 FINDINGS: Cardiac shadow is at the upper limits of normal in size. Tortuous thoracic aorta is noted accentuated by patient rotation to the right. The mediastinal markings are accentuated by patient rotation as well. Lungs are well aerated  bilaterally with minimal bibasilar atelectatic changes noted. No sizable effusion is seen. No bony abnormality is noted. IMPRESSION: Mild bibasilar atelectasis. Electronically Signed   By: Inez Catalina M.D.   On: 09/13/2018 15:21       Subjective: Feeling better.  Feels breathing is improving.  Still has cough, wheezing is better  Discharge Exam: Vitals:   09/18/18 0750 09/18/18 1407 09/18/18 1423 09/18/18 1922  BP:   (!) 130/52   Pulse:   69   Resp:   18   Temp:   (!) 97.5 F (36.4 C)   TempSrc:   Oral   SpO2: 98% 97% 97% 98%  Weight:      Height:        General: Pt is alert, awake, not in acute distress Cardiovascular: RRR, S1/S2 +, no rubs, no gallops Respiratory: CTA bilaterally, no wheezing, no rhonchi Abdominal: Soft, NT, ND, bowel sounds + Extremities: no edema, no cyanosis    The results of significant diagnostics from this hospitalization (including imaging, microbiology, ancillary and laboratory) are  listed below for reference.     Microbiology: Recent Results (from the past 240 hour(s))  Culture, blood (x 2)     Status: None (Preliminary result)   Collection Time: 09/16/18 10:59 AM  Result Value Ref Range Status   Specimen Description BLOOD RIGHT ARM  Final   Special Requests   Final    BOTTLES DRAWN AEROBIC AND ANAEROBIC Blood Culture adequate volume   Culture   Final    NO GROWTH 2 DAYS Performed at [facility], [address], [place], [place] [address]    Report Status PENDING  Incomplete  Culture, blood (x 2)     Status: None (Preliminary result)   Collection Time: 09/16/18 11:05 AM  Result Value Ref Range Status   Specimen Description BLOOD LEFT ARM  Final   Special Requests   Final    BOTTLES DRAWN AEROBIC AND ANAEROBIC Blood Culture adequate volume   Culture   Final    NO GROWTH 2 DAYS Performed at [facility], [address], [place], [place] [address]    Report Status PENDING  Incomplete  MRSA PCR Screening     Status: None   Collection  Time: 09/17/18  2:47 AM  Result Value Ref Range Status   MRSA by PCR NEGATIVE NEGATIVE Final    Comment:        The GeneXpert MRSA Assay (FDA approved for NASAL specimens only), is one component of a comprehensive MRSA colonization surveillance program. It is not intended to diagnose MRSA infection nor to guide or monitor treatment for MRSA infections. Performed at [facility], [address] [place], [place] [address]      Labs: BNP (last 3 results) No results for input(s): BNP in the last 8760 hours. Basic Metabolic Panel: Recent Labs  Lab 09/16/18 1057 09/17/18 0640 09/18/18 0513  NA 131* 134* 135  K 3.3* 3.0* 4.2  CL 98 103 106  CO2 23 22 22   GLUCOSE 186* 106* 208*  BUN 22 16 16   CREATININE 1.19* 0.96 0.90  CALCIUM 8.0* 7.4* 7.8*  MG  --   --  1.9   Liver Function Tests: Recent Labs  Lab 09/16/18 1057  AST 25  ALT 20  ALKPHOS 65  BILITOT 0.8  PROT 7.7  ALBUMIN 2.9*   No results for input(s): LIPASE, AMYLASE in the last 168 hours. No results for input(s): AMMONIA in the last 168 hours. CBC: Recent Labs  Lab 09/16/18 1057 09/17/18 0640 09/18/18 0513  WBC 16.9* 15.6* 10.3  NEUTROABS 14.1*  --   --   HGB 11.7* 9.9* 10.2*  HCT 36.1 30.2* 31.9*  MCV 88.9 89.9 90.9  PLT 225 213 205   Cardiac Enzymes: No results for input(s): CKTOTAL, CKMB, CKMBINDEX, TROPONINI in the last 168 hours. BNP: Invalid input(s): POCBNP CBG: Recent Labs  Lab 09/17/18 1731 09/17/18 2116 09/18/18 0759 09/18/18 1230 09/18/18 1548  GLUCAP 167* 212* 185* 218* 254*   D-Dimer No results for input(s): DDIMER in the last 72 hours. Hgb A1c No results for input(s): HGBA1C in the last 72 hours. Lipid Profile No results for input(s): CHOL, HDL, LDLCALC, TRIG, CHOLHDL, LDLDIRECT in the last 72 hours. Thyroid function studies No results for input(s): TSH, T4TOTAL, T3FREE, THYROIDAB in the last 72 hours.  Invalid input(s): FREET3 Anemia work up No results for input(s):  VITAMINB12, FOLATE, FERRITIN, TIBC, IRON, RETICCTPCT in the last 72 hours. Urinalysis    Component Value Date/Time   COLORURINE YELLOW 09/17/2018 0800   APPEARANCEUR HAZY (  A) 09/17/2018 0800   APPEARANCEUR Clear 03/05/2014 2034   LABSPEC 1.020 09/17/2018 0800   LABSPEC 1.010 03/05/2014 2034   PHURINE 6.0 09/17/2018 0800   GLUCOSEU NEGATIVE 09/17/2018 0800   GLUCOSEU Negative 03/05/2014 2034   HGBUR NEGATIVE 09/17/2018 0800   BILIRUBINUR NEGATIVE 09/17/2018 0800   BILIRUBINUR Negative 03/05/2014 2034   KETONESUR NEGATIVE 09/17/2018 0800   PROTEINUR NEGATIVE 09/17/2018 0800   UROBILINOGEN 0.2 06/13/2013 1148   NITRITE NEGATIVE 09/17/2018 0800   LEUKOCYTESUR NEGATIVE 09/17/2018 0800   LEUKOCYTESUR 1+ 03/05/2014 2034   Sepsis Labs Invalid input(s): PROCALCITONIN,  WBC,  LACTICIDVEN Microbiology Recent Results (from the past 240 hour(s))  Culture, blood (x 2)     Status: None (Preliminary result)   Collection Time: 09/16/18 10:59 AM  Result Value Ref Range Status   Specimen Description BLOOD RIGHT ARM  Final   Special Requests   Final    BOTTLES DRAWN AEROBIC AND ANAEROBIC Blood Culture adequate volume   Culture   Final    NO GROWTH 2 DAYS Performed at [facility], [address] [place], [place] [address]    Report Status PENDING  Incomplete  Culture, blood (x 2)     Status: None (Preliminary result)   Collection Time: 09/16/18 11:05 AM  Result Value Ref Range Status   Specimen Description BLOOD LEFT ARM  Final   Special Requests   Final    BOTTLES DRAWN AEROBIC AND ANAEROBIC Blood Culture adequate volume   Culture   Final    NO GROWTH 2 DAYS Performed at [facility], [address], [place], [place] [address]    Report Status PENDING  Incomplete  MRSA PCR Screening     Status: None   Collection Time: 09/17/18  2:47 AM  Result Value Ref Range Status   MRSA by PCR NEGATIVE NEGATIVE Final    Comment:        The GeneXpert MRSA Assay (FDA approved for NASAL  specimens only), is one component of a comprehensive MRSA colonization surveillance program. It is not intended to diagnose MRSA infection nor to guide or monitor treatment for MRSA infections. Performed at [facility], [address], [place], [place] [address]      Time coordinating discharge: 14mins  SIGNED:   Kathie Dike, MD  [facility] 09/18/2018, 7:41 PM   If 7PM-7AM, please contact night-coverage [email or web address]

## 2018-09-19 DIAGNOSIS — J9611 Chronic respiratory failure with hypoxia: Secondary | ICD-10-CM

## 2018-09-19 LAB — GLUCOSE, CAPILLARY
Glucose-Capillary: 287 mg/dL — ABNORMAL HIGH (ref 70–99)
Glucose-Capillary: 339 mg/dL — ABNORMAL HIGH (ref 70–99)

## 2018-09-19 NOTE — Care Management (Signed)
Patient Information   Patient Name Sheena [name], Sheena [name] ([id number]) Sex Female DOB 1923/12/23  Room Bed  A328 A328-01  Patient Demographics   Address [place] [address] Phone [phone] (Home) E-mail Address [email or web address]  Patient Ethnicity & Race   Ethnic Group Patient Race  Not Hispanic or Latino White or Caucasian  Emergency Contact(s)   Name Relation Home Work Mobile  Bowman,Illa May Daughter   [phone]  [place] Relative [phone]    bowman,todd Yolanda Bonine   [phone]  Documents on File    Status Date Received Description  Documents for the Patient  EMR Medication Summary Not Received    EMR Immunization Summary Not Received    EMR Problem Summary Not Received    EMR Patient Summary Not Received    [place] HIPAA NOTICE OF PRIVACY - Scanned Not Received    [place] E-Signature HIPAA Notice of Privacy Received 09/01/11   [place] E-Signature HIPAA Notice of Privacy Spanish Not Received    Driver's License Not Received    Insurance Card Not Received    Advance Directives/Living Will/HCPOA/POA Not Received    Editor, commissioning Not Received    Insurance Card Received 12/24/14   AMB Correspondence Not Received  10/11 Office Note [facility]  AMB Provider Completed Forms Not Received  12/12 ORDER [facility]  AMB Provider Completed Forms Not Received  04/13 Letter [place] DMV  Release of Information Not Received    AMB HH/NH/Hospice Not Received  10/13 face to face [facility]  AMB Correspondence Not Received  11/13 order [facility]  AMB HH/NH/Hospice Not Received  10/13 face to face [facility]  AMB HH/NH/Hospice Not Received  12/13 d/c Inst [facility]  AMB HH/NH/Hospice Not Received  11/13 Episode [facility]  AMB Provider Completed Forms Not Received  04/13 letter/med report form N  AMB Correspondence Not Received  03/14 h/p [facility]  AMB Correspondence Not  Received  03/14 d/s [facility]  AMB Provider Completed Forms Not Received  03/14 Rx [facility]   AMB Provider Completed Forms Not Received  04/14 Josem Kaufmann [facility]  AMB Provider Completed Forms Not Received  06/14 cmn [facility]  AMB Correspondence Not Received  06/14 [facility]  AMB Provider Completed Forms Not Received  06/14 [facility]n [facility]  AMB Outside Consult Note Not Received  07/14 [facility]  AMB Correspondence Not Received  07/14 D/S [facility]  AMB Correspondence Not Received  07/14 H/P [facility]  AMB Correspondence Not Received  07/14 Op Rpt [facility]  AMB Correspondence Not Received  07/14 ED Record [facility]  AMB HH/NH/Hospice Not Received  09/14 POC [facility]  AMB Correspondence  09/05/13 01/15 D/S [facility]  AMB Correspondence  09/06/13 01/15 d/s [facility]  AMB Correspondence  09/06/13 01/15 h/p [facility]  AMB HH/NH/Hospice  10/04/13 09/14 POC [facility]  AMB Provider Completed Forms  10/24/13 02/15 [facility]   AMB HH/NH/Hospice  11/09/13 09/14 poc [facility]  AMB Provider Completed Forms  17/40/81 44/81 phys cert   Release of Information  11/20/13   HIM ROI Authorization (Expired) 12/12/13 [facility]  Advance Directives/Living Will/HCPOA/POA  12/13/13   Advance Directives/Living Will/HCPOA/POA  12/22/13   AMB Correspondence  06/08/14 PRIOR AUTH BCBS  HIM ROI Authorization  06/09/14   Other Photo ID Not Received    AMB Correspondence  04/04/15 REFILL REQUEST PHARMACY  HIM ROI Authorization (Expired) 04/10/15 Authorization for batch [employer]  fbg     04/10/15  Insurance Card Received 08/12/16 [facility]  [place] HIPAA NOTICE OF PRIVACY - Scanned Received 08/12/16 [facility]  [place] E-Signature HIPAA Notice of Privacy Signed 09/10/18   AMB Provider Completed Forms (Deleted) 11/27/11 04/13 EXAM [place] DMV  AMB HH/NH/Hospice Not Received (Deleted)  11/13 Episode Sum  AMB HH/NH/Hospice (Deleted) 08/25/12 12/13 d/c [facility]  AMB Correspondence  (Deleted) 10/04/12 04/13 letter [place]  AMB Correspondence (Deleted) 12/10/12 03/14 Rx [facility]   AMB Correspondence (Deleted) 07/28/13 09/14 POC [facility]  Documents for the Encounter  AOB (Assignment of Insurance Benefits) Not Received    E-signature AOB Signed 09/16/18   MEDICARE RIGHTS Not Received    E-signature Medicare Rights Signed 09/16/18   ED Patient Billing Extract   ED PB Billing Extract  Admission Information   Current Information   Attending Provider Admitting Provider Admission Type Admission Status  Dhungel, Flonnie Overman, MD Kathie Dike, MD Emergency Admission (Confirmed)       Admission Date/Time Discharge Date Hospital Service Auth/Cert Status  99/35/70 10:21 AM  Internal Medicine [place] [facility] Room/Bed   [facility] AP-DEPT 300 V779/T903-00        Admission   Complaint  .  Hospital Account   Name Acct ID Class Status Primary Coverage  Sheena [name], Sheena [name] [id number] Inpatient Open [facility]      Guarantor Account (for Hospital Account [id number])   Name Relation to Pt Service Area Active? Acct Type  Sheena [name] Self CHSA Yes Personal/Family  Address Phone    [address] [place] [address] [phone])        Coverage Information (for Hospital Account [id number])   F/O Payor/Plan Precert #  [facility] #  Sheena [name], Sheena [name] [id number]  [place] [address] [phone]       Care Everywhere ID:  [id number]

## 2018-09-19 NOTE — Progress Notes (Signed)
Patient discharged to [facility] assisted living, report given to care taker,packet faxed to facility.. Accompanied by staff to an awaiting vehicle.

## 2018-09-19 NOTE — NC FL2 (Signed)
[place] LEVEL OF CARE SCREENING TOOL     IDENTIFICATION  Patient Name: Sheena [name] Birthdate: 01-May-1924 Sex: female Admission Date (Current Location): 09/16/2018  [facility] and [place] Number:  [employer] and Address:  [place] [address], [place]      Provider Number: [id number]  Attending Physician Name and Address:  Louellen Molder, MD  Relative Name and Phone Number:       Current Level of Care: Hospital Recommended Level of Care: [place] Prior Approval Number:    Date Approved/Denied:   PASRR Number:    Discharge Plan: Domiciliary (Rest home)    Current Diagnoses: Patient Active Problem List   Diagnosis Date Noted  . HCAP (healthcare-associated pneumonia) 09/16/2018  . Syncope 05/11/2018  . COPD (chronic obstructive pulmonary disease) ([place]) 05/11/2018  . Acute respiratory failure with hypoxia ([place]) 05/07/2018  . Hypertension   . Diabetes mellitus, type 2 ([place])   . Cholelithiasis 12/11/2016  . Abdominal pain 12/11/2016  . Back pain, thoracic 10/14/2013  . Mild cognitive impairment with memory loss 10/04/2013  . Syncope and collapse 08/26/2013  . Other and unspecified hyperlipidemia 05/14/2013  . Unspecified constipation 05/14/2013  . Osteoarthritis of left hip 01/11/2013  . History of syncope 11/15/2012  . Ankle fracture, left 11/15/2012  . H/O acute renal failure 11/15/2012  . Proximal muscle weakness 10/30/2012  . Neurotic excoriations 09/02/2012  . Loss of balance 06/06/2012  . Melanoma ([place])   . Asthma   . Insomnia, persistent 09/01/2011  . Severe major depression with psychotic features ([place]) 09/01/2011  . Obesity (BMI 30-39.9) 09/01/2011  . Obesity, diabetes, and hypertension syndrome ([place]) 09/01/2011  . COPD with acute exacerbation ([place]) 01/13/2008    Orientation RESPIRATION BLADDER Height & Weight     Self, Situation, Place  O2, Normal Continent Weight: 96.6  kg Height:  5\' 5"  (165.1 cm)  BEHAVIORAL SYMPTOMS/MOOD NEUROLOGICAL BOWEL NUTRITION STATUS  (none) (none) Continent Diet(Heart healthy, Carb Modified)  AMBULATORY STATUS COMMUNICATION OF NEEDS Skin   Limited Assist Verbally Normal                       Personal Care Assistance Level of Assistance  Bathing, Feeding, Dressing Bathing Assistance: Limited assistance Feeding assistance: Independent Dressing Assistance: Limited assistance     Functional Limitations Info  Sight, Hearing, Speech Sight Info: Adequate Hearing Info: Adequate Speech Info: Adequate    SPECIAL CARE FACTORS FREQUENCY                       Contractures Contractures Info: Not present    Additional Factors Info  Code Status, Allergies Code Status Info: DNR Allergies Info: NKA           Current Medications (09/19/2018):  This is the current hospital active medication list Current Facility-Administered Medications  Medication Dose Route Frequency Provider Last Rate Last Dose  . ALPRAZolam [name]) tablet 0.5 mg  0.5 mg Oral Daily PRN Kathie Dike, MD      . amLODipine (NORVASC) tablet 5 mg  5 mg Oral Daily Kathie Dike, MD   5 mg at 09/19/18 0931  . atorvastatin (LIPITOR) tablet 40 mg  40 mg Oral QPM Kathie Dike, MD   40 mg at 09/18/18 1630  . ceFEPIme (MAXIPIME) 1 g in sodium chloride 0.9 % 100 mL IVPB  1 g Intravenous Q24H Kathie Dike, MD 200 mL/hr at 09/18/18 1812 1 g  at 09/18/18 1812  . guaiFENesin (MUCINEX) 12 hr tablet 600 mg  600 mg Oral BID Kathie Dike, MD   600 mg at 09/19/18 0930  . insulin aspart (novoLOG) injection 0-15 Units  0-15 Units Subcutaneous TID WC Kathie Dike, MD   8 Units at 09/19/18 0931  . insulin aspart (novoLOG) injection 0-5 Units  0-5 Units Subcutaneous QHS Kathie Dike, MD   2 Units at 09/18/18 2247  . insulin glargine (LANTUS) injection 25 Units  25 Units Subcutaneous BID Kathie Dike, MD   25 Units at 09/18/18 2247  .  ipratropium-albuterol (DUONEB) 0.5-2.5 (3) MG/3ML nebulizer solution 3 mL  3 mL Nebulization Q6H Kathie Dike, MD   3 mL at 09/19/18 0759  . loratadine (CLARITIN) tablet 10 mg  10 mg Oral Daily Kathie Dike, MD   10 mg at 09/19/18 0930  . MEDLINE mouth rinse  15 mL Mouth Rinse BID Kathie Dike, MD   15 mL at 09/18/18 2249  . predniSONE (DELTASONE) tablet 40 mg  40 mg Oral Q breakfast Kathie Dike, MD   40 mg at 09/19/18 0930  . QUEtiapine (SEROQUEL) tablet 25 mg  25 mg Oral BID Kathie Dike, MD   25 mg at 09/19/18 0931  . sertraline (ZOLOFT) tablet 50 mg  50 mg Oral Daily Kathie Dike, MD   50 mg at 09/19/18 4628     Discharge Medications: Please see discharge summary for a list of discharge medications.  Relevant Imaging Results:  Relevant Lab Results:   Additional [place], LCSW

## 2018-09-19 NOTE — Progress Notes (Signed)
PROGRESS NOTE                                                                                                                                                                                                             Patient Demographics:    Sheena [name], is a 85 y.o. female, DOB - 06/08/1924, DJS:[id number]  Admit date - 09/16/2018   Admitting Physician Kathie Dike, MD  Outpatient Primary MD for the patient is Lorene Dy, MD  LOS - 2    Chief Complaint  Patient presents with  . Shortness of Breath       Brief Narrative 85 year old female with COPD on 3 L home O2 presented to the hospital with healthcare associated pneumonia and COPD with acute exacerbation.  Admitted for further management.   Subjective:   Patient still having cough but reports her dyspnea to be better from yesterday.   Assessment  & Plan :   Principal problem Healthcare associated pneumonia and COPD with acute exacerbation Received empiric vancomycin and cefepime, transition to oral Augmentin to complete her antibiotic course.  Blood culture without any growth.  Still having productive cough.  Acute on chronic respiratory failure with hypoxia (HCC) Secondary to lobar pneumonia and COPD.  Now stable on 2-3 L oxygen (this is what she uses at the facility).  COPD with acute exacerbation Received IV Solu-Medrol and now switched to prednisone taper.  Continue bronchodilators and mucolytic's.  Diabetes mellitus type 2 Continue basal insulin.  CBG stable.  Essential hypertension Stable on Norvasc  Hyperlipidemia Continue statin      Code Status : DNR  Family Communication  : None at bedside  Disposition Plan  : Return to assisted living    Lab Results  Component Value Date   PLT 205 09/18/2018    Antibiotics  :    Anti-infectives (From admission, onward)   Start     Dose/Rate Route Frequency Ordered Stop   09/18/18  0000  amoxicillin-clavulanate (AUGMENTIN) 875-125 MG tablet     1 tablet Oral 2 times daily 09/18/18 1940 09/25/18 2359   09/17/18 1630  vancomycin (VANCOCIN) 1,250 mg in sodium chloride 0.9 % 250 mL IVPB  Status:  Discontinued     1,250 mg 166.7 mL/hr over 90 Minutes Intravenous Every 24 hours 09/16/18 1606 09/18/18 1047   09/16/18 1800  ceFEPIme (  MAXIPIME) 1 g in sodium chloride 0.9 % 100 mL IVPB     1 g 200 mL/hr over 30 Minutes Intravenous Every 24 hours 09/16/18 1620 09/23/18 1759   09/16/18 1630  ceFEPIme (MAXIPIME) 1 g in sodium chloride 0.9 % 100 mL IVPB  Status:  Discontinued     1 g 200 mL/hr over 30 Minutes Intravenous Every 8 hours 09/16/18 1616 09/16/18 1620   09/16/18 1630  vancomycin (VANCOCIN) 2,000 mg in sodium chloride 0.9 % 500 mL IVPB     2,000 mg 250 mL/hr over 120 Minutes Intravenous  Once 09/16/18 1606 09/16/18 2029   09/16/18 1215  cefTRIAXone (ROCEPHIN) 1 g in sodium chloride 0.9 % 100 mL IVPB     1 g 200 mL/hr over 30 Minutes Intravenous  Once 09/16/18 1214 09/16/18 1325   09/16/18 1215  azithromycin (ZITHROMAX) 500 mg in sodium chloride 0.9 % 250 mL IVPB     500 mg 250 mL/hr over 60 Minutes Intravenous  Once 09/16/18 1214 09/16/18 1642        Objective:   Vitals:   09/19/18 0314 09/19/18 0500 09/19/18 0759 09/19/18 0808  BP:  130/60    Pulse:  79    Resp:      Temp:  97.8 F (36.6 C)    TempSrc:  Axillary    SpO2: 92% 95% 98% 97%  Weight:      Height:        Wt Readings from Last 3 Encounters:  09/17/18 96.6 kg  05/26/18 103 kg  05/12/18 103.9 kg     Intake/Output Summary (Last 24 hours) at 09/19/2018 1211 Last data filed at 09/19/2018 0900 Gross per 24 hour  Intake 720 ml  Output 950 ml  Net -230 ml     Physical Exam  Gen: Elderly female fatigued, not in distress HEENT: no pallor, moist mucosa, supple neck Chest: Bibasilar crackles CVS: N S1&S2, no murmurs,  GI: soft, NT, ND, Musculoskeletal: warm, no edema     Data Review:     CBC Recent Labs  Lab 09/16/18 1057 09/17/18 0640 09/18/18 0513  WBC 16.9* 15.6* 10.3  HGB 11.7* 9.9* 10.2*  HCT 36.1 30.2* 31.9*  PLT 225 213 205  MCV 88.9 89.9 90.9  MCH 28.8 29.5 29.1  MCHC 32.4 32.8 32.0  RDW 13.7 13.8 13.8  LYMPHSABS 0.8  --   --   MONOABS 1.8*  --   --   EOSABS 0.0  --   --   BASOSABS 0.0  --   --     Chemistries  Recent Labs  Lab 09/16/18 1057 09/17/18 0640 09/18/18 0513  NA 131* 134* 135  K 3.3* 3.0* 4.2  CL 98 103 106  CO2 23 22 22   GLUCOSE 186* 106* 208*  BUN 22 16 16   CREATININE 1.19* 0.96 0.90  CALCIUM 8.0* 7.4* 7.8*  MG  --   --  1.9  AST 25  --   --   ALT 20  --   --   ALKPHOS 65  --   --   BILITOT 0.8  --   --    ------------------------------------------------------------------------------------------------------------------ No results for input(s): CHOL, HDL, LDLCALC, TRIG, CHOLHDL, LDLDIRECT in the last 72 hours.  Lab Results  Component Value Date   HGBA1C 6.6 (H) 05/12/2013   ------------------------------------------------------------------------------------------------------------------ No results for input(s): TSH, T4TOTAL, T3FREE, THYROIDAB in the last 72 hours.  Invalid input(s): FREET3 ------------------------------------------------------------------------------------------------------------------ No results for input(s): VITAMINB12, FOLATE, FERRITIN, TIBC, IRON, RETICCTPCT in  the last 72 hours.  Coagulation profile Recent Labs  Lab 09/16/18 1057  INR 1.22    No results for input(s): DDIMER in the last 72 hours.  Cardiac Enzymes No results for input(s): CKMB, TROPONINI, MYOGLOBIN in the last 168 hours.  Invalid input(s): CK ------------------------------------------------------------------------------------------------------------------    Component Value Date/Time   BNP 349 03/05/2014 1858    Inpatient Medications  Scheduled Meds: . amLODipine  5 mg Oral Daily  . atorvastatin  40 mg Oral QPM   . guaiFENesin  600 mg Oral BID  . insulin aspart  0-15 Units Subcutaneous TID WC  . insulin aspart  0-5 Units Subcutaneous QHS  . insulin glargine  25 Units Subcutaneous BID  . ipratropium-albuterol  3 mL Nebulization Q6H  . loratadine  10 mg Oral Daily  . mouth rinse  15 mL Mouth Rinse BID  . predniSONE  40 mg Oral Q breakfast  . QUEtiapine  25 mg Oral BID  . sertraline  50 mg Oral Daily   Continuous Infusions: . ceFEPime (MAXIPIME) IV 1 g (09/18/18 1812)   PRN Meds:.ALPRAZolam  Micro Results Recent Results (from the past 240 hour(s))  Culture, blood (x 2)     Status: None (Preliminary result)   Collection Time: 09/16/18 10:59 AM  Result Value Ref Range Status   Specimen Description BLOOD RIGHT ARM  Final   Special Requests   Final    BOTTLES DRAWN AEROBIC AND ANAEROBIC Blood Culture adequate volume   Culture   Final    NO GROWTH 3 DAYS Performed at [facility], [address], [place], [place] [address]    Report Status PENDING  Incomplete  Culture, blood (x 2)     Status: None (Preliminary result)   Collection Time: 09/16/18 11:05 AM  Result Value Ref Range Status   Specimen Description BLOOD LEFT ARM  Final   Special Requests   Final    BOTTLES DRAWN AEROBIC AND ANAEROBIC Blood Culture adequate volume   Culture   Final    NO GROWTH 3 DAYS Performed at [facility], [address] [place], [place] [address]    Report Status PENDING  Incomplete  MRSA PCR Screening     Status: None   Collection Time: 09/17/18  2:47 AM  Result Value Ref Range Status   MRSA by PCR NEGATIVE NEGATIVE Final    Comment:        The GeneXpert MRSA Assay (FDA approved for NASAL specimens only), is one component of a comprehensive MRSA colonization surveillance program. It is not intended to diagnose MRSA infection nor to guide or monitor treatment for MRSA infections. Performed at [facility], [address] [place], [place] [address]     Radiology Reports Dg Chest 1  View  Result Date: 09/10/2018 CLINICAL DATA:  Cough.  Altered mental status. EXAM: CHEST  1 VIEW COMPARISON:  05/26/2018 FINDINGS: The heart is moderately enlarged. Vascular congestion. No sign of interstitial edema. No pneumothorax or pleural effusion. Lungs are under aerated with hypoaeration at the lung bases. IMPRESSION: Cardiomegaly and vascular congestion without pulmonary edema. Electronically Signed   By: Marybelle Killings M.D.   On: 09/10/2018 08:26   Dg Chest 2 View  Result Date: 09/16/2018 CLINICAL DATA:  Shortness of breath. EXAM: CHEST - 2 VIEW COMPARISON:  09/13/2018.  09/10/2018. FINDINGS: Heart size normal. Pulmonary vascularity is normal. Mild bibasilar atelectasis/infiltrates. Similar findings noted on prior exam. Small bilateral pleural effusions. No pneumothorax. IMPRESSION: Mild bibasilar atelectasis/infiltrates. Similar findings noted on prior  exam. Electronically Signed   By: Marcello Moores  Register   On: 09/16/2018 11:42   Dg Chest 2 View  Result Date: 09/13/2018 CLINICAL DATA:  Difficulty breathing EXAM: CHEST - 2 VIEW COMPARISON:  09/10/2018 FINDINGS: Cardiac shadow is at the upper limits of normal in size. Tortuous thoracic aorta is noted accentuated by patient rotation to the right. The mediastinal markings are accentuated by patient rotation as well. Lungs are well aerated bilaterally with minimal bibasilar atelectatic changes noted. No sizable effusion is seen. No bony abnormality is noted. IMPRESSION: Mild bibasilar atelectasis. Electronically Signed   By: Inez Catalina M.D.   On: 09/13/2018 15:21    Time Spent in minutes  25   Andron Marrazzo M.D on 09/19/2018 at 12:11 PM  Between 7am to 7pm - Pager - [phone]  After 7pm go to [email or web address] - password [facility]  [facility] -  Office  [phone]

## 2018-09-19 NOTE — Care Management Important Message (Signed)
Important Message  Patient Details  Name: Sheena [name] MRN: [id number] Date of Birth: August 06, 1924   Medicare Important Message Given:  Yes    [name] 09/19/2018, 4:42 PM

## 2018-09-19 NOTE — NC FL2 (Addendum)
[place] LEVEL OF CARE SCREENING TOOL     IDENTIFICATION  Patient Name: Sheena [name] Birthdate: [birth year] Sex: female Admission Date (Current Location): 09/16/2018  [facility] and [place] Number:  [employer] and Address:  [place] [address], [place]      Provider Number: [id number]  Attending Physician Name and Address:  Louellen Molder, MD  Relative Name and Phone Number:       Current Level of Care: Hospital Recommended Level of Care: [place] Prior Approval Number:    Date Approved/Denied:   PASRR Number:    Discharge Plan: Domiciliary (Rest home) [facility]F   Current Diagnoses: Patient Active Problem List   Diagnosis Date Noted  . HCAP (healthcare-associated pneumonia) 09/16/2018  . Syncope 05/11/2018  . COPD (chronic obstructive pulmonary disease) ([place]) 05/11/2018  . Acute respiratory failure with hypoxia ([place]) 05/07/2018  . Hypertension   . Diabetes mellitus, type 2 ([place])   . Cholelithiasis 12/11/2016  . Abdominal pain 12/11/2016  . Back pain, thoracic 10/14/2013  . Mild cognitive impairment with memory loss 10/04/2013  . Syncope and collapse 08/26/2013  . Other and unspecified hyperlipidemia 05/14/2013  . Unspecified constipation 05/14/2013  . Osteoarthritis of left hip 01/11/2013  . History of syncope 11/15/2012  . Ankle fracture, left 11/15/2012  . H/O acute renal failure 11/15/2012  . Proximal muscle weakness 10/30/2012  . Neurotic excoriations 09/02/2012  . Loss of balance 06/06/2012  . Melanoma ([place])   . Asthma   . Insomnia, persistent 09/01/2011  . Severe major depression with psychotic features ([place]) 09/01/2011  . Obesity (BMI 30-39.9) 09/01/2011  . Obesity, diabetes, and hypertension syndrome ([place]) 09/01/2011  . COPD with acute exacerbation ([place]) 01/13/2008    Orientation RESPIRATION BLADDER Height & Weight     Self, Situation, Place  O2(Nasal Cannula 2L/M)  Continent Weight: 96.6 kg Height:  5\' 5"  (165.1 cm)  BEHAVIORAL SYMPTOMS/MOOD NEUROLOGICAL BOWEL NUTRITION STATUS  (none) (none) Continent Diet(Heart healthy, Carb Modified)  AMBULATORY STATUS COMMUNICATION OF NEEDS Skin   Limited Assist Verbally Normal                       Personal Care Assistance Level of Assistance  Bathing, Feeding, Dressing Bathing Assistance: Limited assistance Feeding assistance: Independent Dressing Assistance: Limited assistance     Functional Limitations Info  Sight, Hearing, Speech Sight Info: Adequate Hearing Info: Adequate Speech Info: Adequate    SPECIAL CARE FACTORS FREQUENCY                       Contractures Contractures Info: Not present    Additional Factors Info  Code Status, Allergies Code Status Info: DNR Allergies Info: NKA           Current Medications (09/19/2018):     Discharge Medications:   TAKE these medications       albuterol 108 (90 Base) MCG/ACT inhaler Commonly known as:  PROVENTIL HFA;VENTOLIN HFA Inhale 1 puff into the lungs every 6 (six) hours as needed for wheezing or shortness of breath. What changed:  Another medication with the same name was added. Make sure you understand how and when to take each.   albuterol (2.5 MG/3ML) 0.083% nebulizer solution Commonly known as:  PROVENTIL Take 3 mLs (2.5 mg total) by nebulization every 6 (six) hours. What changed:  You were already taking a medication with the same name, and this prescription was added.  Make sure you understand how and when to take each.   ALPRAZolam 0.5 MG tablet Commonly known as:  XANAX Take 1 tablet (0.5 mg total) by mouth daily as needed for anxiety.   amLODipine 5 MG tablet Commonly known as:  NORVASC Take 1 tablet (5 mg total) by mouth daily. Start taking on:  September 19, 2018   amoxicillin-clavulanate 875-125 MG tablet Commonly known as:  AUGMENTIN Take 1 tablet by mouth 2 (two) times daily for 7 days.    atorvastatin 40 MG tablet Commonly known as:  LIPITOR TAKE ONE TABLET BY MOUTH EVERY DAY. What changed:  when to take this   guaiFENesin 600 MG 12 hr tablet Commonly known as:  MUCINEX Take 1 tablet (600 mg total) by mouth 2 (two) times daily for 30 days.   Insulin Glargine 100 UNIT/ML Solostar Pen Commonly known as:  LANTUS SOLOSTAR Inject 25 Units into the skin 2 (two) times daily.   loratadine 10 MG tablet Commonly known as:  CLARITIN Take 1 tablet (10 mg total) by mouth daily.   predniSONE 10 MG tablet Commonly known as:  DELTASONE Take 40mg  po daily for 2 days then 30mg  daily for 2 days then 20mg  daily for 2 days then 10mg  daily for 2 days then stop   QUEtiapine 25 MG tablet Commonly known as:  SEROQUEL Take 25 mg by mouth 2 (two) times daily.   sertraline 50 MG tablet Commonly known as:  ZOLOFT Take 50 mg by mouth daily.   tiotropium 18 MCG inhalation capsule Commonly known as:  SPIRIVA HANDIHALER Place 1 capsule (18 mcg total) into inhaler and inhale daily.     Relevant Imaging Results:  Relevant Lab Results:   Additional [place], LCSW

## 2018-09-19 NOTE — Plan of Care (Signed)
  Problem: Acute Rehab PT Goals(only PT should resolve) Goal: Pt Will Go Supine/Side To Sit Outcome: Progressing Flowsheets (Taken 09/19/2018 1249) Pt will go Supine/Side to Sit: with min guard assist Goal: Patient Will Transfer Sit To/From Stand Outcome: Progressing Flowsheets (Taken 09/19/2018 1249) Patient will transfer sit to/from stand: with minimal assist Note:  With RW Goal: Pt Will Transfer Bed To Chair/Chair To Bed Outcome: Progressing Flowsheets (Taken 09/19/2018 1249) Pt will Transfer Bed to Chair/Chair to Bed: with min assist Note:  With RW Goal: Pt Will Ambulate Outcome: Progressing Flowsheets (Taken 09/19/2018 1249) Pt will Ambulate: 50 feet; with minimal assist; with rolling walker   12:49 PM, 09/19/18 Talbot Grumbling, DPT Physical Therapist with [facility] [phone] office

## 2018-09-19 NOTE — Progress Notes (Signed)
SATURATION QUALIFICATIONS: (This note is used to comply with regulatory documentation for home oxygen)  Patient Saturations on Room Air at Rest = 94%  Patient Saturations on Room Air while Ambulating = 78%  Patient Saturations on 2 Liters of oxygen while Ambulating = 91%  Please briefly explain why patient needs home oxygen:

## 2018-09-19 NOTE — Evaluation (Signed)
Physical Therapy Evaluation Patient Details Name: Sheena [name] MRN: [id number] DOB: 11/29/1923 Today's Date: 09/19/2018   History of Present Illness  Sheena [name] is a 85 y.o. female with medical history significant of COPD, diabetes, hypertension who is a resident of an assisted living facility.  Patient reports having cough for the last several days.  She had associated shortness of breath and wheezing.  She is unaware of any fevers.  She has not had any nausea, vomiting, diarrhea.  No chest pain.  She has been feeling weak.  She normally ambulates with a walker.  She is brought to the ER for worsening shortness of breath and had oxygen applied which she said made her feel better.  Chest x-ray indicates bibasilar atelectasis versus pneumonia.  She was also noted to be febrile with a temperature of 101.  She has been started on intravenous antibiotics.  She received nebulizer treatments.    Clinical Impression  Patient presents supine in bed, pleasant and is agreeable to therapy. Patient below baseline for functional mobility and gait, limited secondary to generalized weakness and fatigue with activity. Patient with conflicting responses regarding PLOF and assistance required preferring to nod head instead of verbalizing answers. Patient unsteady upon standing with transfers and gait training requiring increased time and assistance using RW to prevent loss of balance. Patient on 2 LPM with O2 sat ranging from 91-95% with all mobility tasks. Patient maintains static bil knee flexion and with occasional shuffling step progression, limited to labored steps at bedside before requiring seated rest break. Patient left up in chair, bil LE elevated, chair alarm on with call bell in lap. Patient will benefit from continued physical therapy in hospital and recommended venue below to increase strength, balance, endurance for safe ADLs and gait.     Follow Up Recommendations Home health  PT;Supervision/Assistance - 24 hour    Equipment Recommendations  3in1 (PT)(if pt doesn't have one)    Recommendations for Other Services       Precautions / Restrictions Precautions Precautions: Fall Restrictions Weight Bearing Restrictions: No      Mobility  Bed Mobility Overal bed mobility: Needs Assistance Bed Mobility: Supine to Sit     Supine to sit: Mod assist     General bed mobility comments: elevated HOB, use of bed rail, increased time  Transfers Overall transfer level: Needs assistance Equipment used: Rolling walker (2 wheeled) Transfers: Sit to/from [facility] Sit to Stand: Mod assist Stand pivot transfers: Mod assist       General transfer comment: slow labored movement, verbal cues for hand placement and sequencing  Ambulation/Gait Ambulation/Gait assistance: Min assist Gait Distance (Feet): 5 Feet Assistive device: Rolling walker (2 wheeled) Gait Pattern/deviations: Decreased step length - right;Decreased step length - left;Decreased stride length;Trunk flexed Gait velocity: decreased   General Gait Details: decreased heel-toe pattern with occasional shuffling step progressiong, on 2 LPM and O2 sat 91-95%, mild shortness of breath noted  Stairs            Wheelchair Mobility    Modified Rankin (Stroke Patients Only)       Balance Overall balance assessment: Needs assistance Sitting-balance support: Feet supported;Bilateral upper extremity supported Sitting balance-Leahy Scale: Fair Sitting balance - Comments: seated edge of bed, posterior pelvic tilt   Standing balance support: During functional activity;Bilateral upper extremity supported Standing balance-Leahy Scale: Fair Standing balance comment: with RW  Pertinent Vitals/Pain Pain Assessment: No/denies pain    Home Living Family/patient expects to be discharged to:: Assisted living               Home  Equipment: Walker - 2 wheels;Walker - 4 wheels;Other (comment) Additional Comments: home oxygen    Prior Function Level of Independence: Needs assistance   Gait / Transfers Assistance Needed: Pt reports being household ambulator with RW, sedentary lifestyle  ADL's / Homemaking Assistance Needed: assisted by ALF staff        Hand Dominance        Extremity/Trunk Assessment   Upper Extremity Assessment Upper Extremity Assessment: Generalized weakness    Lower Extremity Assessment Lower Extremity Assessment: Generalized weakness    Cervical / Trunk Assessment Cervical / Trunk Assessment: Kyphotic  Communication   Communication: No difficulties  Cognition Arousal/Alertness: Awake/alert Behavior During Therapy: Flat affect Overall Cognitive Status: Within Functional Limits for tasks assessed                                 General Comments: Pt with conflicting respsonses to questions regarding PLOF, slow to respond, and with minimal conversation, but able to follow commands appropriately      General Comments      Exercises     Assessment/Plan    PT Assessment Patient needs continued PT services  PT Problem List Decreased strength;Decreased activity tolerance;Decreased balance;Decreased mobility       PT Treatment Interventions Gait training;Functional mobility training;Therapeutic activities;Therapeutic exercise;Balance training;Patient/family education    PT Goals (Current goals can be found in the Care Plan section)  Acute Rehab PT Goals Patient Stated Goal: return to ALF with personnel to assist PT Goal Formulation: With patient Time For Goal Achievement: 09/26/18 Potential to Achieve Goals: Good    Frequency Min 3X/week   Barriers to discharge        Co-evaluation               AM-PAC PT "6 Clicks" Mobility  Outcome Measure Help needed turning from your back to your side while in a flat bed without using bedrails?: A Lot Help  needed moving from lying on your back to sitting on the side of a flat bed without using bedrails?: A Lot Help needed moving to and from a bed to a chair (including a wheelchair)?: A Lot Help needed standing up from a chair using your arms (e.g., wheelchair or bedside chair)?: A Lot Help needed to walk in hospital room?: A Lot Help needed climbing 3-5 steps with a railing? : Total 6 Click Score: 11    End of Session Equipment Utilized During Treatment: Gait belt;Oxygen(2 LPM) Activity Tolerance: Patient tolerated treatment well;Patient limited by fatigue Patient left: in chair;with call bell/phone within reach;with chair alarm set Nurse Communication: Mobility status PT Visit Diagnosis: Unsteadiness on feet (R26.81);Other abnormalities of gait and mobility (R26.89);Muscle weakness (generalized) (M62.81)    Time: 3790-2409 PT Time Calculation (min) (ACUTE ONLY): 35 min   Charges:   PT Evaluation $PT Eval Moderate Complexity: 1 Mod PT Treatments $Gait Training: 8-22 mins $Therapeutic Activity: 8-22 mins        12:48 PM, 09/19/18 Talbot Grumbling, DPT Physical Therapist with [facility] [phone] office

## 2018-09-19 NOTE — Care Management Note (Addendum)
Case Management Note  Patient Details  Name: Sheena [name] MRN: [id number] Date of Birth: 02/26/1924  Subjective/Objective:     From [facility]. Needs HH, oxygen and neb machine. Facility prefers [place] for DME and [facility] for [employer]. CMS list provided.                Action/Plan: DME referral sen to [facility], expedited delivery requested so to prevent DC delays. Pt has used [facility] int he past and [facility] unable to take pt d/t payor source. Anon Raices referral given to [facility] and discussed with Carmelia Roller, [employer] rep.   CSW making arrangement for return to facility.  Expected Discharge Date:     09/19/2018             Expected Discharge Plan:  Assisted Living / Rest Home  In-House Referral:  Clinical Social Work  Discharge planning Services  CM Consult  Post Acute Care Choice:  Home Health, Durable Medical Equipment Choice offered to:  NA  DME Arranged:  Oxygen, Nebulizer machine DME Agency:  [facility]  HH Arranged:  PT [place] Agency:  [facility]  Status of Service:  Completed, signed off  If discussed at [employer], dates discussed:    Additional Comments: ADDENDUM: Pt active with [facility] (they are able to accept pt's [facility]). [facility] rep updated. Cassie, [facility] rep aware of DC today.  Sherald Barge, RN 09/19/2018, 12:14 PM

## 2018-09-20 NOTE — Clinical Social Work Note (Signed)
Clinical Social Work Assessment  Patient Details  Name: TANZANIA BASHAM MRN: [id number] Date of Birth: 12/02/1923  Date of referral:  09/19/18               Reason for consult:  Facility Placement                Permission sought to share information with:  Family Supports Permission granted to share information::  Yes, Verbal Permission Granted  Name::     [place]::     Relationship::  daughter  Contact Information:  [phone]  Housing/Transportation Living arrangements for the past 2 months:  [place] of Information:  Patient Patient Interpreter Needed:  None Criminal Activity/Legal Involvement Pertinent to Current Situation/Hospitalization:  No - Comment as needed Significant Relationships:  Adult Children, Other Family Members Lives with:  Facility Resident Do you feel safe going back to the place where you live?  Yes Need for family participation in patient care:  No (Coment)  Care giving concerns:  Pt without O2, but needs help with ADLs lives at [facility].  Pt states she has been there 2 weeks, does not remember where she lived before that.   Social Worker assessment / plan:  Ms Husband is a 19 YO Caucasian female diagnosed wih Pneumonia, COPD and dementia.  Pt is oriented X1, poor historian.  Called family to update.  No answer.  Received call back stating family is sick with flu, cannot come to see patient, not feeling well enough to speak on phone.  Employment status:  Retired Health visitor PT Recommendations:  Home with [place] / Referral to community resources:     Patient/Family's Response to care:  Patient states she is fine with returning to [facility]  Patient/Family's Understanding of and Emotional Response to Diagnosis, Current Treatment, and Prognosis:  Lacks understanding, no emotional response.  Emotional Assessment Appearance:  Appears stated age Attitude/Demeanor/Rapport:   Lethargic Affect (typically observed):  Quiet Orientation:    Alcohol / Substance use:  Not Applicable Psych involvement (Current and /or in the community):  No (Comment)  Discharge Needs  Concerns to be addressed:  No discharge needs identified Readmission within the last 30 days:  No Current discharge risk:  None Barriers to Discharge:  No Barriers Identified   Trish Mage, LCSW 09/20/2018, 8:40 AM

## 2018-09-21 LAB — CULTURE, BLOOD (ROUTINE X 2)
CULTURE: NO GROWTH
Culture: NO GROWTH
Special Requests: ADEQUATE
Special Requests: ADEQUATE

## 2018-10-01 ENCOUNTER — Encounter (HOSPITAL_COMMUNITY): Payer: Self-pay | Admitting: Emergency Medicine

## 2018-10-01 ENCOUNTER — Emergency Department (HOSPITAL_COMMUNITY): Payer: Medicare Other

## 2018-10-01 ENCOUNTER — Emergency Department (HOSPITAL_COMMUNITY)
Admission: EM | Admit: 2018-10-01 | Discharge: 2018-10-01 | Disposition: A | Discharge status: Home / Self Care | Payer: Medicare Other | Attending: Emergency Medicine | Admitting: Emergency Medicine

## 2018-10-01 DIAGNOSIS — J45909 Unspecified asthma, uncomplicated: Secondary | ICD-10-CM | POA: Diagnosis not present

## 2018-10-01 DIAGNOSIS — Z79899 Other long term (current) drug therapy: Secondary | ICD-10-CM | POA: Diagnosis not present

## 2018-10-01 DIAGNOSIS — Y92129 Unspecified place in nursing home as the place of occurrence of the external cause: Secondary | ICD-10-CM | POA: Insufficient documentation

## 2018-10-01 DIAGNOSIS — S5002XA Contusion of left elbow, initial encounter: Secondary | ICD-10-CM | POA: Diagnosis not present

## 2018-10-01 DIAGNOSIS — J449 Chronic obstructive pulmonary disease, unspecified: Secondary | ICD-10-CM | POA: Diagnosis not present

## 2018-10-01 DIAGNOSIS — W010XXA Fall on same level from slipping, tripping and stumbling without subsequent striking against object, initial encounter: Secondary | ICD-10-CM | POA: Insufficient documentation

## 2018-10-01 DIAGNOSIS — E119 Type 2 diabetes mellitus without complications: Secondary | ICD-10-CM | POA: Insufficient documentation

## 2018-10-01 DIAGNOSIS — Z85828 Personal history of other malignant neoplasm of skin: Secondary | ICD-10-CM | POA: Insufficient documentation

## 2018-10-01 DIAGNOSIS — Y998 Other external cause status: Secondary | ICD-10-CM | POA: Insufficient documentation

## 2018-10-01 DIAGNOSIS — F0390 Unspecified dementia without behavioral disturbance: Secondary | ICD-10-CM | POA: Insufficient documentation

## 2018-10-01 DIAGNOSIS — S59902A Unspecified injury of left elbow, initial encounter: Secondary | ICD-10-CM | POA: Diagnosis present

## 2018-10-01 DIAGNOSIS — S50811A Abrasion of right forearm, initial encounter: Secondary | ICD-10-CM | POA: Insufficient documentation

## 2018-10-01 DIAGNOSIS — T148XXA Other injury of unspecified body region, initial encounter: Secondary | ICD-10-CM | POA: Diagnosis not present

## 2018-10-01 DIAGNOSIS — Z96642 Presence of left artificial hip joint: Secondary | ICD-10-CM | POA: Insufficient documentation

## 2018-10-01 DIAGNOSIS — I10 Essential (primary) hypertension: Secondary | ICD-10-CM | POA: Insufficient documentation

## 2018-10-01 DIAGNOSIS — F329 Major depressive disorder, single episode, unspecified: Secondary | ICD-10-CM | POA: Diagnosis not present

## 2018-10-01 DIAGNOSIS — W19XXXA Unspecified fall, initial encounter: Secondary | ICD-10-CM

## 2018-10-01 DIAGNOSIS — Y9389 Activity, other specified: Secondary | ICD-10-CM | POA: Diagnosis not present

## 2018-10-01 DIAGNOSIS — Z794 Long term (current) use of insulin: Secondary | ICD-10-CM | POA: Diagnosis not present

## 2018-10-01 LAB — CBC WITH DIFFERENTIAL/PLATELET
Abs Immature Granulocytes: 0.1 10*3/uL — ABNORMAL HIGH (ref 0.00–0.07)
Basophils Absolute: 0 10*3/uL (ref 0.0–0.1)
Basophils Relative: 0 %
Eosinophils Absolute: 0.2 10*3/uL (ref 0.0–0.5)
Eosinophils Relative: 2 %
HCT: 33.4 % — ABNORMAL LOW (ref 36.0–46.0)
Hemoglobin: 10.5 g/dL — ABNORMAL LOW (ref 12.0–15.0)
IMMATURE GRANULOCYTES: 1 %
LYMPHS ABS: 1.5 10*3/uL (ref 0.7–4.0)
Lymphocytes Relative: 12 %
MCH: 28.9 pg (ref 26.0–34.0)
MCHC: 31.4 g/dL (ref 30.0–36.0)
MCV: 92 fL (ref 80.0–100.0)
Monocytes Absolute: 1.2 10*3/uL — ABNORMAL HIGH (ref 0.1–1.0)
Monocytes Relative: 10 %
NEUTROS PCT: 75 %
Neutro Abs: 9.6 10*3/uL — ABNORMAL HIGH (ref 1.7–7.7)
Platelets: 208 10*3/uL (ref 150–400)
RBC: 3.63 MIL/uL — ABNORMAL LOW (ref 3.87–5.11)
RDW: 14.7 % (ref 11.5–15.5)
WBC: 12.7 10*3/uL — ABNORMAL HIGH (ref 4.0–10.5)
nRBC: 0 % (ref 0.0–0.2)

## 2018-10-01 LAB — COMPREHENSIVE METABOLIC PANEL
ALK PHOS: 93 U/L (ref 38–126)
ALT: 25 U/L (ref 0–44)
AST: 22 U/L (ref 15–41)
Albumin: 2.6 g/dL — ABNORMAL LOW (ref 3.5–5.0)
Anion gap: 7 (ref 5–15)
BUN: 16 mg/dL (ref 8–23)
CO2: 28 mmol/L (ref 22–32)
Calcium: 7.6 mg/dL — ABNORMAL LOW (ref 8.9–10.3)
Chloride: 100 mmol/L (ref 98–111)
Creatinine, Ser: 0.93 mg/dL (ref 0.44–1.00)
GFR calc Af Amer: >60 mL/min (ref >60)
GFR calc non Af Amer: 53 mL/min — ABNORMAL LOW (ref >60)
Glucose, Bld: 177 mg/dL — ABNORMAL HIGH (ref 70–99)
Potassium: 3.7 mmol/L (ref 3.5–5.1)
Sodium: 135 mmol/L (ref 135–145)
Total Bilirubin: 0.4 mg/dL (ref 0.3–1.2)
Total Protein: 6.3 g/dL — ABNORMAL LOW (ref 6.5–8.1)

## 2018-10-01 LAB — URINALYSIS, ROUTINE W REFLEX MICROSCOPIC
BILIRUBIN URINE: NEGATIVE
Glucose, UA: NEGATIVE mg/dL
Hgb urine dipstick: NEGATIVE
Ketones, ur: NEGATIVE mg/dL
Nitrite: NEGATIVE
Protein, ur: NEGATIVE mg/dL
SPECIFIC GRAVITY, URINE: 1.004 — AB (ref 1.005–1.030)
pH: 7 (ref 5.0–8.0)

## 2018-10-01 MED ORDER — AMOXICILLIN-POT CLAVULANATE 875-125 MG PO TABS: 1.0000 | ORAL_TABLET | Freq: Two times a day (BID) | ORAL | 0 refills | Status: DC

## 2018-10-01 MED ORDER — AMOXICILLIN-POT CLAVULANATE 875-125 MG PO TABS
1.0000 | ORAL_TABLET | Freq: Once | ORAL | Status: AC
  Administered 2018-10-01: 1 via ORAL
  Filled 2018-10-01: qty 1

## 2018-10-01 NOTE — ED Provider Notes (Signed)
[facility] EMERGENCY DEPARTMENT Provider Note   CSN: [id number] Arrival date & time: 10/01/18  1415    History   Chief Complaint Chief Complaint  Patient presents with  . Fall    HPI Sheena [name] is a 85 y.o. female.     HPI   Patient with dementia, presents from her nursing care facility after a fall.  She is unable to specify what happened.  No information supplied, on transfer during the incident today.  Level 5 caveat-dementia  Past Medical History:  Diagnosis Date  . Asthma    treated by Dr. Donneta Romberg  . Depression   . Diabetes mellitus, type 2 ([place])   . H/O: hysterectomy 1973   fibroids  . Hypertension   . Left arm pain   . Lumbago   . Melanoma ([place])    left arm, resected, annual CXR's ordered  . Syncope 08/2013   Suspected vagal-mediated    Patient Active Problem List   Diagnosis Date Noted  . HCAP (healthcare-associated pneumonia) 09/16/2018  . Syncope 05/11/2018  . COPD (chronic obstructive pulmonary disease) ([place]) 05/11/2018  . Acute respiratory failure with hypoxia ([place]) 05/07/2018  . Hypertension   . Diabetes mellitus, type 2 ([place])   . Cholelithiasis 12/11/2016  . Abdominal pain 12/11/2016  . Back pain, thoracic 10/14/2013  . Mild cognitive impairment with memory loss 10/04/2013  . Syncope and collapse 08/26/2013  . Other and unspecified hyperlipidemia 05/14/2013  . Unspecified constipation 05/14/2013  . Osteoarthritis of left hip 01/11/2013  . History of syncope 11/15/2012  . Ankle fracture, left 11/15/2012  . H/O acute renal failure 11/15/2012  . Proximal muscle weakness 10/30/2012  . Neurotic excoriations 09/02/2012  . Loss of balance 06/06/2012  . Melanoma ([place])   . Asthma   . Insomnia, persistent 09/01/2011  . Severe major depression with psychotic features ([place]) 09/01/2011  . Obesity (BMI 30-39.9) 09/01/2011  . Obesity, diabetes, and hypertension syndrome ([place]) 09/01/2011  . COPD with acute exacerbation ([place]) 01/13/2008     Past Surgical History:  Procedure Laterality Date  . ABDOMINAL HYSTERECTOMY  1973   fibroid uterus  . BREAST BIOPSY  1985   normal  . JOINT REPLACEMENT   2003   Left Hip  . TOTAL HIP ARTHROPLASTY  2003   left, Dr. Durward Fortes  . TRANSTHORACIC ECHOCARDIOGRAM  08/23/13   EF 60-65%, borderline concentric LVH, impaired LV diastolic relaxation, LA ULN     OB History   No obstetric history on file.      Home Medications    Prior to Admission medications   Medication Sig Start Date End Date Taking? Authorizing Provider  albuterol (PROVENTIL HFA;VENTOLIN HFA) 108 (90 Base) MCG/ACT inhaler Inhale 1 puff into the lungs every 6 (six) hours as needed for wheezing or shortness of breath.   Yes [provider]  albuterol (PROVENTIL) (2.5 MG/3ML) 0.083% nebulizer solution Take 3 mLs (2.5 mg total) by nebulization every 6 (six) hours. 09/18/18  Yes Kathie Dike, MD  ALPRAZolam [name]) 0.5 MG tablet Take 1 tablet (0.5 mg total) by mouth daily as needed for anxiety. 05/13/18  Yes Johnson, Clanford L, MD  amLODipine (NORVASC) 5 MG tablet Take 1 tablet (5 mg total) by mouth daily. 09/19/18  Yes Kathie Dike, MD  atorvastatin (LIPITOR) 40 MG tablet TAKE ONE TABLET BY MOUTH EVERY DAY. Patient taking differently: Take 40 mg by mouth every evening.  06/12/14  Yes [name], MD  guaiFENesin (MUCINEX) 600 MG 12 hr tablet Take  1 tablet (600 mg total) by mouth 2 (two) times daily for 30 days. 09/10/18 10/10/18 Yes Alveria Apley, PA-C  Insulin Glargine (LANTUS SOLOSTAR) 100 UNIT/ML Solostar Pen Inject 25 Units into the skin 2 (two) times daily. 05/09/18  Yes Kathie Dike, MD  loratadine (CLARITIN) 10 MG tablet Take 1 tablet (10 mg total) by mouth daily. 05/09/18 05/09/19 Yes Kathie Dike, MD  predniSONE (DELTASONE) 10 MG tablet Take 40mg  po daily for 2 days then 30mg  daily for 2 days then 20mg  daily for 2 days then 10mg  daily for 2 days then stop 09/18/18  Yes Kathie Dike, MD  QUEtiapine  (SEROQUEL) 25 MG tablet Take 25 mg by mouth 2 (two) times daily.   Yes [provider]  sertraline (ZOLOFT) 50 MG tablet Take 50 mg by mouth daily.   Yes [provider]  tiotropium (SPIRIVA HANDIHALER) 18 MCG inhalation capsule Place 1 capsule (18 mcg total) into inhaler and inhale daily. 05/09/18 05/09/19 Yes Kathie Dike, MD  amoxicillin-clavulanate (AUGMENTIN) 875-125 MG tablet Take 1 tablet by mouth 2 (two) times daily. One po bid x 7 days 10/01/18   Daleen Bo, MD    Family History Family History  Problem Relation Age of Onset  . Diabetes Mother   . Heart disease Mother        CHF  . Heart disease Father   . Diabetes Father     Social History Social History   Tobacco Use  . Smoking status: Never Smoker  . Smokeless tobacco: Never Used  Substance Use Topics  . Alcohol use: No    Comment: occasional  . Drug use: No     Allergies   Patient has no known allergies.   Review of Systems Review of Systems  Unable to perform ROS: Dementia     Physical Exam Updated Vital Signs BP (!) 155/82 (BP Location: Right Arm)   Pulse 83   Temp 98 F (36.7 C) (Rectal)   Resp 18   Ht 5\' 5"  (1.651 m)   Wt 96.6 kg   SpO2 96%   BMI 35.44 kg/m   Physical Exam Vitals signs and nursing note reviewed.  Constitutional:      General: She is not in acute distress.    Appearance: She is well-developed. She is obese. She is not ill-appearing, toxic-appearing or diaphoretic.     Comments: Elderly, frail  HENT:     Head: Normocephalic and atraumatic.     Comments: No injury to scalp, or cranium.    Right Ear: External ear normal.     Left Ear: External ear normal.  Eyes:     Conjunctiva/sclera: Conjunctivae normal.     Pupils: Pupils are equal, round, and reactive to light.  Neck:     Musculoskeletal: Normal range of motion and neck supple.     Trachea: Phonation normal.  Cardiovascular:     Rate and Rhythm: Normal rate and regular rhythm.     Heart  sounds: Normal heart sounds.  Pulmonary:     Effort: Pulmonary effort is normal.     Breath sounds: Normal breath sounds.  Abdominal:     General: There is no distension.     Palpations: Abdomen is soft. There is no mass.     Tenderness: There is no abdominal tenderness.  Musculoskeletal: Normal range of motion.        General: Signs of injury (Contusion and abrasion right proximal dorsal forearm) present. No swelling or tenderness.  Comments: Tender upper and lower back without step-off of the spinous processes.  No visible contusion to the back.  Skin:    General: Skin is warm and dry.  Neurological:     Mental Status: She is alert.     Cranial Nerves: No cranial nerve deficit.     Sensory: No sensory deficit.     Motor: No weakness or abnormal muscle tone.     Coordination: Coordination normal.  Psychiatric:        Mood and Affect: Mood normal.        Behavior: Behavior normal.      ED Treatments / Results  Labs (all labs ordered are listed, but only abnormal results are displayed) Labs Reviewed  COMPREHENSIVE METABOLIC PANEL - Abnormal; Notable for the following components:      Result Value   Glucose, Bld 177 (*)    Calcium 7.6 (*)    Total Protein 6.3 (*)    Albumin 2.6 (*)    GFR calc non Af Amer 53 (*)    All other components within normal limits  CBC WITH DIFFERENTIAL/PLATELET - Abnormal; Notable for the following components:   WBC 12.7 (*)    RBC 3.63 (*)    Hemoglobin 10.5 (*)    HCT 33.4 (*)    Neutro Abs 9.6 (*)    Monocytes Absolute 1.2 (*)    Abs Immature Granulocytes 0.10 (*)    All other components within normal limits  URINALYSIS, ROUTINE W REFLEX MICROSCOPIC - Abnormal; Notable for the following components:   Color, Urine STRAW (*)    Specific Gravity, Urine 1.004 (*)    Leukocytes,Ua MODERATE (*)    Bacteria, UA RARE (*)    All other components within normal limits    EKG None  Radiology Dg Chest 2 View  Result Date:  10/01/2018 CLINICAL DATA:  Fall, pain EXAM: CHEST - 2 VIEW COMPARISON:  09/16/2018 FINDINGS: Worsening airspace disease throughout the lungs bilaterally concerning for pneumonia or edema. Heart is borderline in size. Low lung volumes. No visible significant effusions or acute bony abnormality. IMPRESSION: Worsening bilateral airspace disease since prior study could reflect edema or infection. Electronically Signed   By: Rolm Baptise M.D.   On: 10/01/2018 16:09   Dg Lumbar Spine Complete  Result Date: 10/01/2018 CLINICAL DATA:  Fall.  Pain. EXAM: LUMBAR SPINE - COMPLETE 4+ VIEW COMPARISON:  CT scan of the abdomen/pelvis 05/11/2018 FINDINGS: Bones are diffusely demineralized. No fracture. No subluxation. Stable appearance of degenerative disc disease lower lumbar spine. SI joints unremarkable. Patient is status post left hip replacement, incompletely visualized. IMPRESSION: Negative. Electronically Signed   By: Misty Stanley M.D.   On: 10/01/2018 16:19    Procedures Procedures (including critical care time)  Medications Ordered in ED Medications  amoxicillin-clavulanate (AUGMENTIN) 875-125 MG per tablet 1 tablet (1 tablet Oral Given 10/01/18 1919)     Initial Impression / Assessment and Plan / ED Course  I have reviewed the triage vital signs and the nursing notes.  Pertinent labs & imaging results that were available during my care of the patient were reviewed by me and considered in my medical decision making (see chart for details).  Clinical Course as of Oct 01 2001  Sat Oct 01, 2018  1849 Normal except leukocytes and 21-50 white cells per high-power field.  Also bacteria present.  Urinalysis, Routine w reflex microscopic(!) [EW]  1850 Normal except white count high, hemoglobin low  CBC with Differential(!) [EW]  1851  Normal except glucose high, calcium low, total protein low, albumin low, GFR low  Comprehensive metabolic panel(!) [EW]  6010 No fracture, images reviewed by me  DG  Lumbar Spine Complete [EW]  1851 Nonspecific airway disease, infiltrate versus CHF, images reviewed by me  DG Chest 2 View [EW]  1927 I discussed the patient's case with Alita Chyle, a relative of hers who is the father of her power of attorney.  Currently her power of attorney, [name] is not available.  Versus concerned that the patient fell today while walking to the bathroom.  We talked about getting physical therapy to assess the patient at her facility to assess for the need of treatment and to assist with avoidance of falls in the future.  He was appreciative of this effort.  Understands that the patient will be discharged.   [EW]    Clinical Course User Index [EW] Daleen Bo, MD        Patient Vitals for the past 24 hrs:  BP Temp Temp src Pulse Resp SpO2 Height Weight  10/01/18 1858 (!) 155/82 98 F (36.7 C) Rectal 83 18 96 % - -  10/01/18 1856 - 98 F (36.7 C) Rectal - - - - -  10/01/18 1830 (!) 155/72 - - - - - - -  10/01/18 1800 111/90 - - - - - - -  10/01/18 1730 (!) 131/50 - - - - - - -  10/01/18 1700 (!) 150/61 - - 73 - 96 % - -  10/01/18 1657 (!) 158/68 - - 80 18 97 % - -  10/01/18 1423 - - - - - - 5\' 5"  (1.651 m) 96.6 kg    8:03 PM Reevaluation with update and discussion. After initial assessment and treatment, an updated evaluation reveals she remains comfortable has no further complaints.  Findings discussed and questions answered. Daleen Bo   Medical Decision Making: Patient presenting for evaluation after fall at her nursing care facility.  She is unable to give any history.  She is nontoxic in appearance.  Evaluation is nonspecific here.  No evidence for significant fracture.  Somewhat abnormal chest x-ray, possible infection versus edema.  Patient had a 2D echo, October 2018 with ejection fraction 65%.  Doubt acute congestive heart failure.  Vital signs do not suggest Sirs/sepsis.  Mild elevation white count.  Normal oxygenation 96% on room air.  No  fever.  Will treat with Augmentin for pulmonary infection and discharge patient, with outpatient follow-up by her PCP.  CRITICAL CARE-no Performed by: Daleen Bo    Nursing Notes Reviewed/ Care Coordinated Applicable Imaging Reviewed Interpretation of Laboratory Data incorporated into ED treatment  The patient appears reasonably screened and/or stabilized for discharge and I doubt any other medical condition or other [facility] requiring further screening, evaluation, or treatment in the ED at this time prior to discharge.  Plan: Home Medications-continue usual medications; Home Treatments-rest, fluids physical therapy, assessment and treatment at her facility; return here if the recommended treatment, does not improve the symptoms; Recommended follow up-PCP checkup 1 week and as needed      Final Clinical Impressions(s) / ED Diagnoses   Final diagnoses:  Fall, initial encounter  Contusion of left elbow, initial encounter  Abrasion    ED Discharge Orders         Ordered    amoxicillin-clavulanate (AUGMENTIN) 875-125 MG tablet  2 times daily     10/01/18 2002           Ginna Schuur,  Vira Agar, MD 10/01/18 2003

## 2018-10-01 NOTE — ED Triage Notes (Signed)
EMS reports pt fell at [facility] unwitnessed.  Pt c/o left hip pain.

## 2018-10-01 NOTE — ED Notes (Signed)
Call to [facility]   Report to [facility]

## 2018-10-01 NOTE — Discharge Instructions (Signed)
There were no serious injuries from the fall today.  We are giving you an additional prescription for pneumonia since your lung x-ray still showed some infection.  Make sure you are drinking plenty of fluids and eating 3 meals each day.  Try to get some help if you need to get up and walk.  Return here, if needed, for problems.

## 2018-10-01 NOTE — ED Notes (Signed)
Call to [facility] for transport to [facility]

## 2018-12-13 ENCOUNTER — Other Ambulatory Visit: Payer: Self-pay

## 2018-12-13 ENCOUNTER — Emergency Department (HOSPITAL_COMMUNITY)
Admission: EM | Admit: 2018-12-13 | Discharge: 2018-12-14 | Disposition: A | Discharge status: Home / Self Care | Payer: Medicare Other | Attending: Emergency Medicine | Admitting: Emergency Medicine

## 2018-12-13 ENCOUNTER — Encounter (HOSPITAL_COMMUNITY): Payer: Self-pay | Admitting: *Deleted

## 2018-12-13 ENCOUNTER — Emergency Department (HOSPITAL_COMMUNITY): Payer: Medicare Other

## 2018-12-13 DIAGNOSIS — R112 Nausea with vomiting, unspecified: Secondary | ICD-10-CM | POA: Diagnosis present

## 2018-12-13 DIAGNOSIS — R55 Syncope and collapse: Secondary | ICD-10-CM | POA: Insufficient documentation

## 2018-12-13 DIAGNOSIS — N3001 Acute cystitis with hematuria: Secondary | ICD-10-CM

## 2018-12-13 DIAGNOSIS — E860 Dehydration: Secondary | ICD-10-CM | POA: Diagnosis not present

## 2018-12-13 DIAGNOSIS — Z79899 Other long term (current) drug therapy: Secondary | ICD-10-CM | POA: Diagnosis not present

## 2018-12-13 DIAGNOSIS — Z794 Long term (current) use of insulin: Secondary | ICD-10-CM | POA: Insufficient documentation

## 2018-12-13 DIAGNOSIS — Z96642 Presence of left artificial hip joint: Secondary | ICD-10-CM | POA: Diagnosis not present

## 2018-12-13 DIAGNOSIS — J45909 Unspecified asthma, uncomplicated: Secondary | ICD-10-CM | POA: Insufficient documentation

## 2018-12-13 DIAGNOSIS — E119 Type 2 diabetes mellitus without complications: Secondary | ICD-10-CM | POA: Diagnosis not present

## 2018-12-13 LAB — CBC WITH DIFFERENTIAL/PLATELET
Abs Immature Granulocytes: 0.03 10*3/uL (ref 0.00–0.07)
Basophils Absolute: 0 10*3/uL (ref 0.0–0.1)
Basophils Relative: 0 %
Eosinophils Absolute: 0.2 10*3/uL (ref 0.0–0.5)
Eosinophils Relative: 2 %
HCT: 34.5 % — ABNORMAL LOW (ref 36.0–46.0)
Hemoglobin: 11.2 g/dL — ABNORMAL LOW (ref 12.0–15.0)
Immature Granulocytes: 0 %
Lymphocytes Relative: 22 %
Lymphs Abs: 1.9 10*3/uL (ref 0.7–4.0)
MCH: 30.4 pg (ref 26.0–34.0)
MCHC: 32.5 g/dL (ref 30.0–36.0)
MCV: 93.8 fL (ref 80.0–100.0)
Monocytes Absolute: 1 10*3/uL (ref 0.1–1.0)
Monocytes Relative: 11 %
Neutro Abs: 5.7 10*3/uL (ref 1.7–7.7)
Neutrophils Relative %: 65 %
Platelets: 201 10*3/uL (ref 150–400)
RBC: 3.68 MIL/uL — ABNORMAL LOW (ref 3.87–5.11)
RDW: 14.7 % (ref 11.5–15.5)
WBC: 8.8 10*3/uL (ref 4.0–10.5)
nRBC: 0 % (ref 0.0–0.2)

## 2018-12-13 LAB — COMPREHENSIVE METABOLIC PANEL
ALT: 35 U/L (ref 0–44)
AST: 42 U/L — ABNORMAL HIGH (ref 15–41)
Albumin: 3.5 g/dL (ref 3.5–5.0)
Alkaline Phosphatase: 122 U/L (ref 38–126)
Anion gap: 11 (ref 5–15)
BUN: 28 mg/dL — ABNORMAL HIGH (ref 8–23)
CO2: 21 mmol/L — ABNORMAL LOW (ref 22–32)
Calcium: 8.3 mg/dL — ABNORMAL LOW (ref 8.9–10.3)
Chloride: 103 mmol/L (ref 98–111)
Creatinine, Ser: 1.27 mg/dL — ABNORMAL HIGH (ref 0.44–1.00)
GFR calc Af Amer: 42 mL/min — ABNORMAL LOW (ref >60)
GFR calc non Af Amer: 37 mL/min — ABNORMAL LOW (ref >60)
Glucose, Bld: 146 mg/dL — ABNORMAL HIGH (ref 70–99)
Potassium: 3.6 mmol/L (ref 3.5–5.1)
Sodium: 135 mmol/L (ref 135–145)
Total Bilirubin: 0.4 mg/dL (ref 0.3–1.2)
Total Protein: 8 g/dL (ref 6.5–8.1)

## 2018-12-13 LAB — PROTIME-INR
INR: 1.1 (ref 0.8–1.2)
Prothrombin Time: 14.3 seconds (ref 11.4–15.2)

## 2018-12-13 LAB — CBG MONITORING, ED: Glucose-Capillary: 138 mg/dL — ABNORMAL HIGH (ref 70–99)

## 2018-12-13 LAB — TROPONIN I: Troponin I: <0.03 ng/mL (ref <0.03)

## 2018-12-13 MED ORDER — SODIUM CHLORIDE 0.9 % IV SOLN: INTRAVENOUS | Status: DC

## 2018-12-13 MED ORDER — SODIUM CHLORIDE 0.9 % IV BOLUS
1000.0000 mL | Freq: Once | INTRAVENOUS | Status: AC
  Administered 2018-12-13: 1000 mL via INTRAVENOUS

## 2018-12-13 NOTE — ED Provider Notes (Signed)
[facility] EMERGENCY DEPARTMENT Provider Note   CSN: [id number] Arrival date & time: 12/13/18  2228    History   Chief Complaint Chief Complaint  Patient presents with   Emesis    HPI Sheena [name] is a 85 y.o. female.     Pt presents to the ED today with n/v/d.  The pt is a poor historian, so information is obtained from EMS.  Pt has had diarrhea today.  When she was in the shower tonight, she became shaky.  Staff thought she was having a seizure, but she did not have a loc.  Pt did have some emesis afterwards.  She denies any sx now.  No hx of f/c.  No sob or cp.  No seizure hx.     Past Medical History:  Diagnosis Date   Asthma    treated by Dr. Donneta Romberg   Depression    Diabetes mellitus, type 2 ([place])    H/O: hysterectomy 1973   fibroids   Hypertension    Left arm pain    Lumbago    Melanoma ([place])    left arm, resected, annual CXR's ordered   Syncope 08/2013   Suspected vagal-mediated    Patient Active Problem List   Diagnosis Date Noted   HCAP (healthcare-associated pneumonia) 09/16/2018   Syncope 05/11/2018   COPD (chronic obstructive pulmonary disease) ([place]) 05/11/2018   Acute respiratory failure with hypoxia ([place]) 05/07/2018   Hypertension    Diabetes mellitus, type 2 ([place])    Cholelithiasis 12/11/2016   Abdominal pain 12/11/2016   Back pain, thoracic 10/14/2013   Mild cognitive impairment with memory loss 10/04/2013   Syncope and collapse 08/26/2013   Other and unspecified hyperlipidemia 05/14/2013   Unspecified constipation 05/14/2013   Osteoarthritis of left hip 01/11/2013   History of syncope 11/15/2012   Ankle fracture, left 11/15/2012   H/O acute renal failure 11/15/2012   Proximal muscle weakness 10/30/2012   Neurotic excoriations 09/02/2012   Loss of balance 06/06/2012   Melanoma ([place])    Asthma    Insomnia, persistent 09/01/2011   Severe major depression with psychotic features ([place]) 09/01/2011    Obesity (BMI 30-39.9) 09/01/2011   Obesity, diabetes, and hypertension syndrome ([place]) 09/01/2011   COPD with acute exacerbation ([place]) 01/13/2008    Past Surgical History:  Procedure Laterality Date   ABDOMINAL HYSTERECTOMY  1973   fibroid uterus   BREAST BIOPSY  1985   normal   JOINT REPLACEMENT   2003   Left Hip   TOTAL HIP ARTHROPLASTY  2003   left, Dr. Durward Fortes   TRANSTHORACIC ECHOCARDIOGRAM  08/23/13   EF 60-65%, borderline concentric LVH, impaired LV diastolic relaxation, LA ULN     OB History   No obstetric history on file.      Home Medications    Prior to Admission medications   Medication Sig Start Date End Date Taking? Authorizing Provider  albuterol (PROVENTIL HFA;VENTOLIN HFA) 108 (90 Base) MCG/ACT inhaler Inhale 1 puff into the lungs every 6 (six) hours as needed for wheezing or shortness of breath.    [provider]  albuterol (PROVENTIL) (2.5 MG/3ML) 0.083% nebulizer solution Take 3 mLs (2.5 mg total) by nebulization every 6 (six) hours. 09/18/18   Kathie Dike, MD  ALPRAZolam [name]) 0.5 MG tablet Take 1 tablet (0.5 mg total) by mouth daily as needed for anxiety. 05/13/18   Johnson, Clanford L, MD  amLODipine (NORVASC) 5 MG tablet Take 1 tablet (5 mg total) by mouth daily.  09/19/18   Kathie Dike, MD  amoxicillin-clavulanate (AUGMENTIN) 875-125 MG tablet Take 1 tablet by mouth 2 (two) times daily. One po bid x 7 days 10/01/18   Daleen Bo, MD  atorvastatin (LIPITOR) 40 MG tablet TAKE ONE TABLET BY MOUTH EVERY DAY. Patient taking differently: Take 40 mg by mouth every evening.  06/12/14   [name], MD  Insulin Glargine (LANTUS SOLOSTAR) 100 UNIT/ML Solostar Pen Inject 25 Units into the skin 2 (two) times daily. 05/09/18   Kathie Dike, MD  loratadine (CLARITIN) 10 MG tablet Take 1 tablet (10 mg total) by mouth daily. 05/09/18 05/09/19  Kathie Dike, MD  predniSONE (DELTASONE) 10 MG tablet Take 40mg  po daily for 2 days then 30mg  daily  for 2 days then 20mg  daily for 2 days then 10mg  daily for 2 days then stop 09/18/18   Kathie Dike, MD  QUEtiapine (SEROQUEL) 25 MG tablet Take 25 mg by mouth 2 (two) times daily.    [provider]  sertraline (ZOLOFT) 50 MG tablet Take 50 mg by mouth daily.    [provider]  tiotropium (SPIRIVA HANDIHALER) 18 MCG inhalation capsule Place 1 capsule (18 mcg total) into inhaler and inhale daily. 05/09/18 05/09/19  Kathie Dike, MD    Family History Family History  Problem Relation Age of Onset   Diabetes Mother    Heart disease Mother        CHF   Heart disease Father    Diabetes Father     Social History Social History   Tobacco Use   Smoking status: Never Smoker   Smokeless tobacco: Never Used  Substance Use Topics   Alcohol use: No    Comment: occasional   Drug use: No     Allergies   Patient has no known allergies.   Review of Systems Review of Systems  Neurological: Positive for tremors and weakness.  All other systems reviewed and are negative.    Physical Exam Updated Vital Signs BP 132/63 (BP Location: Left Arm)    Pulse 78    Temp 97.8 F (36.6 C) (Oral)    Resp 18    Ht 5\' 7"  (1.702 m)    Wt 99.8 kg    SpO2 94%    BMI 34.46 kg/m   Physical Exam Vitals signs and nursing note reviewed.  Constitutional:      Appearance: Normal appearance.  HENT:     Head: Normocephalic and atraumatic.     Right Ear: External ear normal.     Left Ear: External ear normal.     Nose: Nose normal.     Mouth/Throat:     Mouth: Mucous membranes are moist.  Eyes:     Extraocular Movements: Extraocular movements intact.     Pupils: Pupils are equal, round, and reactive to light.  Neck:     Musculoskeletal: Normal range of motion and neck supple.  Cardiovascular:     Rate and Rhythm: Normal rate and regular rhythm.     Pulses: Normal pulses.     Heart sounds: Normal heart sounds.  Pulmonary:     Effort: Pulmonary effort is normal.      Breath sounds: Normal breath sounds.  Abdominal:     General: Abdomen is flat. Bowel sounds are normal.     Palpations: Abdomen is soft.  Musculoskeletal: Normal range of motion.  Skin:    General: Skin is warm.     Capillary Refill: Capillary refill takes less than 2 seconds.  Neurological:     General: No focal deficit present.     Mental Status: She is alert and oriented to person, place, and time.  Psychiatric:        Mood and Affect: Mood normal.        Behavior: Behavior normal.        Thought Content: Thought content normal.        Judgment: Judgment normal.      ED Treatments / Results  Labs (all labs ordered are listed, but only abnormal results are displayed) Labs Reviewed  CBC WITH DIFFERENTIAL/PLATELET  COMPREHENSIVE METABOLIC PANEL  PROTIME-INR  URINALYSIS, ROUTINE W REFLEX MICROSCOPIC  TROPONIN I  CBG MONITORING, ED    EKG None  Radiology No results found.  Procedures Procedures (including critical care time)  Medications Ordered in ED Medications  sodium chloride 0.9 % bolus 1,000 mL (has no administration in time range)    And  0.9 %  sodium chloride infusion (has no administration in time range)     Initial Impression / Assessment and Plan / ED Course  I have reviewed the triage vital signs and the nursing notes.  Pertinent labs & imaging results that were available during my care of the patient were reviewed by me and considered in my medical decision making (see chart for details).     Episode in shower does not sound like a seizure.  She was likely volume depleted by the diarrhea and had some orthostatic hypotension with standing.  The pt will be signed out to Dr. Roderic Palau at shift change.  All labs and Xrays pending.  Final Clinical Impressions(s) / ED Diagnoses   Final diagnoses:  Dehydration  Near syncope    ED Discharge Orders    None       Isla Pence, MD 12/13/18 2248

## 2018-12-13 NOTE — ED Triage Notes (Signed)
Pt brought in by [facility] from [place]; pt has had n/v/d that started tonight; staff reports pt looks like she had a seizure while in shower; pt c/o headache

## 2018-12-13 NOTE — ED Notes (Signed)
ED Provider at bedside. 

## 2018-12-14 LAB — URINALYSIS, ROUTINE W REFLEX MICROSCOPIC
Bilirubin Urine: NEGATIVE
Glucose, UA: NEGATIVE mg/dL
Ketones, ur: NEGATIVE mg/dL
Nitrite: POSITIVE — AB
Protein, ur: 30 mg/dL — AB
Specific Gravity, Urine: 1.013 (ref 1.005–1.030)
pH: 6 (ref 5.0–8.0)

## 2018-12-14 MED ORDER — CIPROFLOXACIN HCL 500 MG PO TABS: 500.0000 mg | ORAL_TABLET | Freq: Two times a day (BID) | ORAL | 0 refills | Status: DC

## 2018-12-14 MED ORDER — ONDANSETRON 4 MG PO TBDP: ORAL_TABLET | ORAL | 0 refills | Status: DC

## 2018-12-14 NOTE — Discharge Instructions (Addendum)
Follow up with your  md in 2 days.  Drink plenty of fluids

## 2018-12-16 LAB — URINE CULTURE: Culture: >=100000 — AB

## 2018-12-17 ENCOUNTER — Telehealth: Payer: Self-pay | Admitting: Emergency Medicine

## 2018-12-17 NOTE — Telephone Encounter (Signed)
Post ED Visit - Positive Culture Follow-up  Culture report reviewed by antimicrobial stewardship pharmacist: [place] Team []  Elenor Quinones, Pharm.D. []  Heide Guile, Pharm.D., BCPS AQ-ID []  Parks Neptune, Pharm.D., BCPS []  Alycia Rossetti, Pharm.D., BCPS []  [place], Pharm.D., BCPS, AAHIVP []  Legrand Como, Pharm.D., BCPS, AAHIVP []  Salome Arnt, PharmD, BCPS []  Johnnette Gourd, PharmD, BCPS []  Hughes Better, PharmD, BCPS [x]  Elicia Lamp, PharmD []  Laqueta Linden, PharmD, BCPS []  Albertina Parr, PharmD  [place] Team []  Leodis Sias, PharmD []  Lindell Spar, PharmD []  Royetta Asal, PharmD []  Graylin Shiver, Rph []  Rema Fendt) Glennon Mac, PharmD []  Arlyn Dunning, PharmD []  Netta Cedars, PharmD []  Dia Sitter, PharmD []  Leone Haven, PharmD []  Gretta Arab, PharmD []  Theodis Shove, PharmD []  Peggyann Juba, PharmD []  Reuel Boom, PharmD   Positive urine culture Treated with Ciprofloxacin, organism sensitive to the same and no further patient follow-up is required at this time.  [place] Dickey Caamano 12/17/2018, 2:09 PM

## 2019-07-26 ENCOUNTER — Emergency Department (HOSPITAL_COMMUNITY)
Admission: EM | Admit: 2019-07-26 | Discharge: 2019-07-26 | Disposition: A | Discharge status: Home / Self Care | Payer: Medicare Other | Attending: Emergency Medicine | Admitting: Emergency Medicine

## 2019-07-26 ENCOUNTER — Encounter (HOSPITAL_COMMUNITY): Payer: Self-pay | Admitting: *Deleted

## 2019-07-26 ENCOUNTER — Emergency Department (HOSPITAL_COMMUNITY): Payer: Medicare Other

## 2019-07-26 ENCOUNTER — Other Ambulatory Visit: Payer: Self-pay

## 2019-07-26 DIAGNOSIS — E119 Type 2 diabetes mellitus without complications: Secondary | ICD-10-CM | POA: Insufficient documentation

## 2019-07-26 DIAGNOSIS — J449 Chronic obstructive pulmonary disease, unspecified: Secondary | ICD-10-CM | POA: Diagnosis not present

## 2019-07-26 DIAGNOSIS — Z96642 Presence of left artificial hip joint: Secondary | ICD-10-CM | POA: Diagnosis not present

## 2019-07-26 DIAGNOSIS — J45909 Unspecified asthma, uncomplicated: Secondary | ICD-10-CM | POA: Diagnosis not present

## 2019-07-26 DIAGNOSIS — R10814 Left lower quadrant abdominal tenderness: Secondary | ICD-10-CM | POA: Diagnosis not present

## 2019-07-26 DIAGNOSIS — R112 Nausea with vomiting, unspecified: Secondary | ICD-10-CM

## 2019-07-26 DIAGNOSIS — N390 Urinary tract infection, site not specified: Secondary | ICD-10-CM

## 2019-07-26 DIAGNOSIS — U071 COVID-19: Secondary | ICD-10-CM | POA: Insufficient documentation

## 2019-07-26 DIAGNOSIS — I10 Essential (primary) hypertension: Secondary | ICD-10-CM | POA: Diagnosis not present

## 2019-07-26 DIAGNOSIS — Z79899 Other long term (current) drug therapy: Secondary | ICD-10-CM | POA: Insufficient documentation

## 2019-07-26 DIAGNOSIS — Z794 Long term (current) use of insulin: Secondary | ICD-10-CM | POA: Insufficient documentation

## 2019-07-26 LAB — COMPREHENSIVE METABOLIC PANEL
ALT: 32 U/L (ref 0–44)
AST: 40 U/L (ref 15–41)
Albumin: 3.2 g/dL — ABNORMAL LOW (ref 3.5–5.0)
Alkaline Phosphatase: 76 U/L (ref 38–126)
Anion gap: 10 (ref 5–15)
BUN: 18 mg/dL (ref 8–23)
CO2: 25 mmol/L (ref 22–32)
Calcium: 8.2 mg/dL — ABNORMAL LOW (ref 8.9–10.3)
Chloride: 98 mmol/L (ref 98–111)
Creatinine, Ser: 1.19 mg/dL — ABNORMAL HIGH (ref 0.44–1.00)
GFR calc Af Amer: 46 mL/min — ABNORMAL LOW (ref >60)
GFR calc non Af Amer: 39 mL/min — ABNORMAL LOW (ref >60)
Glucose, Bld: 158 mg/dL — ABNORMAL HIGH (ref 70–99)
Potassium: 3.7 mmol/L (ref 3.5–5.1)
Sodium: 133 mmol/L — ABNORMAL LOW (ref 135–145)
Total Bilirubin: 0.6 mg/dL (ref 0.3–1.2)
Total Protein: 8.2 g/dL — ABNORMAL HIGH (ref 6.5–8.1)

## 2019-07-26 LAB — CBC WITH DIFFERENTIAL/PLATELET
Abs Immature Granulocytes: 0.04 10*3/uL (ref 0.00–0.07)
Basophils Absolute: 0 10*3/uL (ref 0.0–0.1)
Basophils Relative: 0 %
Eosinophils Absolute: 0 10*3/uL (ref 0.0–0.5)
Eosinophils Relative: 0 %
HCT: 34.8 % — ABNORMAL LOW (ref 36.0–46.0)
Hemoglobin: 11.2 g/dL — ABNORMAL LOW (ref 12.0–15.0)
Immature Granulocytes: 1 %
Lymphocytes Relative: 10 %
Lymphs Abs: 0.7 10*3/uL (ref 0.7–4.0)
MCH: 30.2 pg (ref 26.0–34.0)
MCHC: 32.2 g/dL (ref 30.0–36.0)
MCV: 93.8 fL (ref 80.0–100.0)
Monocytes Absolute: 0.9 10*3/uL (ref 0.1–1.0)
Monocytes Relative: 12 %
Neutro Abs: 5.4 10*3/uL (ref 1.7–7.7)
Neutrophils Relative %: 77 %
Platelets: 194 10*3/uL (ref 150–400)
RBC: 3.71 MIL/uL — ABNORMAL LOW (ref 3.87–5.11)
RDW: 13.1 % (ref 11.5–15.5)
WBC: 7.1 10*3/uL (ref 4.0–10.5)
nRBC: 0 % (ref 0.0–0.2)

## 2019-07-26 LAB — LIPASE, BLOOD: Lipase: 24 U/L (ref 11–51)

## 2019-07-26 LAB — URINALYSIS, ROUTINE W REFLEX MICROSCOPIC
Bilirubin Urine: NEGATIVE
Glucose, UA: NEGATIVE mg/dL
Ketones, ur: NEGATIVE mg/dL
Nitrite: POSITIVE — AB
Protein, ur: 30 mg/dL — AB
Specific Gravity, Urine: 1.015 (ref 1.005–1.030)
pH: 5 (ref 5.0–8.0)

## 2019-07-26 LAB — SARS CORONAVIRUS 2 (TAT 6-24 HRS): SARS Coronavirus 2: POSITIVE — AB

## 2019-07-26 LAB — POC SARS CORONAVIRUS 2 AG -  ED: SARS Coronavirus 2 Ag: POSITIVE — AB

## 2019-07-26 MED ORDER — LEVOFLOXACIN 750 MG PO TABS: 750.0000 mg | ORAL_TABLET | Freq: Every day | ORAL | 0 refills | Status: DC

## 2019-07-26 MED ORDER — ONDANSETRON 4 MG PO TBDP: 4.0000 mg | ORAL_TABLET | Freq: Three times a day (TID) | ORAL | 0 refills | Status: DC | PRN

## 2019-07-26 MED ORDER — SODIUM CHLORIDE 0.9 % IV SOLN: 1.0000 g | Freq: Once | INTRAVENOUS | Status: DC

## 2019-07-26 MED ORDER — ONDANSETRON HCL 4 MG/2ML IJ SOLN
4.0000 mg | Freq: Once | INTRAMUSCULAR | Status: AC
  Administered 2019-07-26: 4 mg via INTRAVENOUS
  Filled 2019-07-26: qty 2

## 2019-07-26 MED ORDER — IOHEXOL 300 MG/ML  SOLN
100.0000 mL | Freq: Once | INTRAMUSCULAR | Status: AC | PRN
  Administered 2019-07-26: 80 mL via INTRAVENOUS

## 2019-07-26 MED ORDER — SODIUM CHLORIDE 0.9 % IV SOLN: Freq: Once | INTRAVENOUS | Status: AC

## 2019-07-26 MED ORDER — LEVOFLOXACIN 750 MG PO TABS
750.0000 mg | ORAL_TABLET | Freq: Once | ORAL | Status: AC
  Administered 2019-07-26: 750 mg via ORAL
  Filled 2019-07-26: qty 1

## 2019-07-26 MED ORDER — CIPROFLOXACIN HCL 500 MG PO TABS: 500.0000 mg | ORAL_TABLET | Freq: Two times a day (BID) | ORAL | 0 refills | Status: DC

## 2019-07-26 NOTE — Discharge Instructions (Signed)
Your evaluation today shows that you are positive for coronavirus however your oxygen is normal and you have no signs of pneumonia.  You have had some nausea and vomiting which is likely related to a urinary tract infection.  Please take levofloxacin once a day for the next 7 days.  Try to avoid taking nausea medication but if you need it you may use Zofran every 6 hours as needed.  Seek a medical exam for severe or worsening symptoms or worsening difficulty breathing.  Have your family doctor follow-up your culture results in 48 hours

## 2019-07-26 NOTE — ED Notes (Signed)
[facility] here to transport back to [facility]

## 2019-07-26 NOTE — ED Triage Notes (Signed)
Patient arrived to unit via [facility] from [facility] due to nausea and vomiting for one hour. Patient vomited 3 times in one hour and once en route.  EMS give 4mg  Zofran IV. CBG 214 per EMS.  Patient does not, but nods head for yes or no.

## 2019-07-26 NOTE — ED Provider Notes (Addendum)
[facility] EMERGENCY DEPARTMENT Provider Note   CSN: [id number] Arrival date & time: 07/26/19  1055     History Chief Complaint  Patient presents with  . Nausea    Sheena [name] is a 85 y.o. female.  HPI   This patient is a 85 year old female sent from high [place] facility because of 3 episodes of vomiting that occurred just prior to arrival.  The patient has a known history of hypertension, has recently finished a course of cephalexin 4 times a day for 7 days, the cause is not exactly clear.  She has had no recent admissions to the hospital however she was last seen in May for dehydration and prior to that in February for a fall.  She had been admitted for pneumonia in February 2020.  The patient does not give much in the way of information however she is able to tell me that she had some nausea and vomited, she does not know when it started, she does not have any pain, she denies coughing or shortness of breath, paramedics do not have much else in the way of information.  Past Medical History:  Diagnosis Date  . Asthma    treated by Dr. Donneta Romberg  . Depression   . Diabetes mellitus, type 2 ([place])   . H/O: hysterectomy 1973   fibroids  . Hypertension   . Left arm pain   . Lumbago   . Melanoma ([place])    left arm, resected, annual CXR's ordered  . Syncope 08/2013   Suspected vagal-mediated    Patient Active Problem List   Diagnosis Date Noted  . HCAP (healthcare-associated pneumonia) 09/16/2018  . Syncope 05/11/2018  . COPD (chronic obstructive pulmonary disease) ([place]) 05/11/2018  . Acute respiratory failure with hypoxia ([place]) 05/07/2018  . Hypertension   . Diabetes mellitus, type 2 ([place])   . Cholelithiasis 12/11/2016  . Abdominal pain 12/11/2016  . Back pain, thoracic 10/14/2013  . Mild cognitive impairment with memory loss 10/04/2013  . Syncope and collapse 08/26/2013  . Other and unspecified hyperlipidemia 05/14/2013  . Unspecified constipation 05/14/2013   . Osteoarthritis of left hip 01/11/2013  . History of syncope 11/15/2012  . Ankle fracture, left 11/15/2012  . H/O acute renal failure 11/15/2012  . Proximal muscle weakness 10/30/2012  . Neurotic excoriations 09/02/2012  . Loss of balance 06/06/2012  . Melanoma ([place])   . Asthma   . Insomnia, persistent 09/01/2011  . Severe major depression with psychotic features ([place]) 09/01/2011  . Obesity (BMI 30-39.9) 09/01/2011  . Obesity, diabetes, and hypertension syndrome ([place]) 09/01/2011  . COPD with acute exacerbation ([place]) 01/13/2008    Past Surgical History:  Procedure Laterality Date  . ABDOMINAL HYSTERECTOMY  1973   fibroid uterus  . BREAST BIOPSY  1985   normal  . JOINT REPLACEMENT   2003   Left Hip  . TOTAL HIP ARTHROPLASTY  2003   left, Dr. Durward Fortes  . TRANSTHORACIC ECHOCARDIOGRAM  08/23/13   EF 60-65%, borderline concentric LVH, impaired LV diastolic relaxation, LA ULN     OB History   No obstetric history on file.     Family History  Problem Relation Age of Onset  . Diabetes Mother   . Heart disease Mother        CHF  . Heart disease Father   . Diabetes Father     Social History   Tobacco Use  . Smoking status: Never Smoker  . Smokeless tobacco: Never Used  Substance Use  Topics  . Alcohol use: No    Comment: occasional  . Drug use: No    Home Medications Prior to Admission medications   Medication Sig Start Date End Date Taking? Authorizing Provider  albuterol (PROVENTIL HFA;VENTOLIN HFA) 108 (90 Base) MCG/ACT inhaler Inhale 1 puff into the lungs every 6 (six) hours as needed for wheezing or shortness of breath.   Yes [provider]  albuterol (PROVENTIL) (2.5 MG/3ML) 0.083% nebulizer solution Take 3 mLs (2.5 mg total) by nebulization every 6 (six) hours. 09/18/18  Yes Kathie Dike, MD  ALPRAZolam [name]) 0.5 MG tablet Take 1 tablet (0.5 mg total) by mouth daily as needed for anxiety. 05/13/18  Yes Johnson, Clanford L, MD  amLODipine  (NORVASC) 5 MG tablet Take 1 tablet (5 mg total) by mouth daily. 09/19/18  Yes Kathie Dike, MD  atorvastatin (LIPITOR) 40 MG tablet TAKE ONE TABLET BY MOUTH EVERY DAY. Patient taking differently: Take 40 mg by mouth every evening.  06/12/14  Yes [name], MD  Insulin Glargine (LANTUS SOLOSTAR) 100 UNIT/ML Solostar Pen Inject 25 Units into the skin 2 (two) times daily. Patient taking differently: Inject 15 Units into the skin 2 (two) times daily.  05/09/18  Yes Kathie Dike, MD  loratadine (CLARITIN) 10 MG tablet Take 1 tablet (10 mg total) by mouth daily. 05/09/18 07/26/19 Yes Kathie Dike, MD  QUEtiapine (SEROQUEL) 25 MG tablet Take 25 mg by mouth 2 (two) times daily.   Yes [provider]  sertraline (ZOLOFT) 50 MG tablet Take 50 mg by mouth daily.   Yes [provider]  tiotropium (SPIRIVA HANDIHALER) 18 MCG inhalation capsule Place 1 capsule (18 mcg total) into inhaler and inhale daily. 05/09/18 07/26/19 Yes Kathie Dike, MD  levofloxacin (LEVAQUIN) 750 MG tablet Take 1 tablet (750 mg total) by mouth daily. 07/26/19   Noemi Chapel, MD  Calcium Carbonate (CALCIUM 500 PO) Take by mouth.  04/30/16  [provider]    Allergies    Patient has no known allergies.  Review of Systems   Review of Systems  Unable to perform ROS: Acuity of condition    Physical Exam Updated Vital Signs BP (!) 127/56 (BP Location: Left Arm)   Pulse 73   Temp 98.7 F (37.1 C) (Oral)   Resp 16   Ht 1.676 m (5\' 6" )   Wt 105.2 kg   SpO2 100%   BMI 37.45 kg/m   Physical Exam Vitals and nursing note reviewed.  Constitutional:      General: She is not in acute distress.    Appearance: She is well-developed.  HENT:     Head: Normocephalic and atraumatic.     Mouth/Throat:     Pharynx: No oropharyngeal exudate.  Eyes:     General: No scleral icterus.       Right eye: No discharge.        Left eye: No discharge.     Conjunctiva/sclera: Conjunctivae normal.      Pupils: Pupils are equal, round, and reactive to light.  Neck:     Thyroid: No thyromegaly.     Vascular: No JVD.  Cardiovascular:     Rate and Rhythm: Normal rate and regular rhythm.     Heart sounds: Normal heart sounds. No murmur. No friction rub. No gallop.   Pulmonary:     Effort: Pulmonary effort is normal. No respiratory distress.     Breath sounds: Normal breath sounds. No wheezing or rales.  Abdominal:  General: Bowel sounds are normal. There is no distension.     Palpations: Abdomen is soft. There is no mass.     Tenderness: There is abdominal tenderness.     Comments: There is focal tenderness to palpation in the left lower quadrant, there is no guarding or other tenderness, there is no peritoneal signs  Musculoskeletal:        General: No tenderness. Normal range of motion.     Cervical back: Normal range of motion and neck supple.  Lymphadenopathy:     Cervical: No cervical adenopathy.  Skin:    General: Skin is warm and dry.     Findings: No erythema or rash.  Neurological:     Mental Status: She is alert.     Coordination: Coordination normal.  Psychiatric:        Behavior: Behavior normal.     ED Results / Procedures / Treatments   Labs (all labs ordered are listed, but only abnormal results are displayed) Labs Reviewed  CBC WITH DIFFERENTIAL/PLATELET - Abnormal; Notable for the following components:      Result Value   RBC 3.71 (*)    Hemoglobin 11.2 (*)    HCT 34.8 (*)    All other components within normal limits  COMPREHENSIVE METABOLIC PANEL - Abnormal; Notable for the following components:   Sodium 133 (*)    Glucose, Bld 158 (*)    Creatinine, Ser 1.19 (*)    Calcium 8.2 (*)    Total Protein 8.2 (*)    Albumin 3.2 (*)    GFR calc non Af Amer 39 (*)    GFR calc Af Amer 46 (*)    All other components within normal limits  URINALYSIS, ROUTINE W REFLEX MICROSCOPIC - Abnormal; Notable for the following components:   APPearance CLOUDY (*)    Hgb  urine dipstick SMALL (*)    Protein, ur 30 (*)    Nitrite POSITIVE (*)    Leukocytes,Ua SMALL (*)    Bacteria, UA RARE (*)    All other components within normal limits  POC SARS CORONAVIRUS 2 AG -  ED - Abnormal; Notable for the following components:   SARS Coronavirus 2 Ag POSITIVE (*)    All other components within normal limits  URINE CULTURE  SARS CORONAVIRUS 2 (TAT 6-24 HRS)  LIPASE, BLOOD    EKG ED ECG REPORT  I personally interpreted this EKG   Date: 07/26/2019   Rate: 74  Rhythm: normal sinus rhythm  QRS Axis: normal  Intervals: normal  ST/T Wave abnormalities: nonspecific T wave changes  Conduction Disutrbances:none  Narrative Interpretation:   Old EKG Reviewed: unchanged c/w 12/13/18   Radiology CT ABDOMEN PELVIS W CONTRAST  Result Date: 07/26/2019 CLINICAL DATA:  Nausea and vomiting since 1000 hours, question diverticulitis, COVID-19 positive; past history melanoma, hypertension, diabetes mellitus, asthma EXAM: CT ABDOMEN AND PELVIS WITH CONTRAST TECHNIQUE: Multidetector CT imaging of the abdomen and pelvis was performed using the standard protocol following bolus administration of intravenous contrast. CONTRAST:  45mL OMNIPAQUE IOHEXOL 300 MG/ML  SOLN COMPARISON:  Bibasilar atelectasis and interstitial thickening. Focal area of consolidation RIGHT lower lobe. FINDINGS: Lower chest: Dependent gallstone in gallbladder. No definite gallbladder wall thickening. Liver unremarkable. Hepatobiliary: Normal appearance Pancreas: Normal appearance Spleen: Normal appearance Adrenals/Urinary Tract: Probable tiny BILATERAL renal cysts. Adrenal glands, kidneys, ureters, and bladder otherwise normal appearance. Stomach/Bowel: Appendix not visualized but no pericecal inflammatory process seen. Mild sigmoid diverticulosis without definite evidence of acute diverticulitis. Small hiatal  hernia questioned. Remainder of stomach inadequately distended, unable to adequately assess gastric wall  thickness. Remaining bowel loops normal. Vascular/Lymphatic: Atherosclerotic calcifications aorta and iliac arteries without aneurysm. Mild coronary arterial calcification. No adenopathy. Reproductive: Uterus surgically absent with atrophic ovaries. Other: No free air or free fluid.  No definite hernia or Musculoskeletal: Osseous demineralization. Beam hardening artifacts in pelvis from LEFT hip prosthesis. Degenerative disc disease changes lumbar spine. No acute osseous findings. IMPRESSION: Sigmoid diverticulosis without evidence of diverticulitis. Cholelithiasis. Questionable small hiatal hernia. No acute intra-abdominal or intrapelvic abnormalities identified. Aortic Atherosclerosis (ICD10-I70.0). Electronically Signed   By: Lavonia Dana M.D.   On: 07/26/2019 13:55   DG Chest Port 1 View  Result Date: 07/26/2019 CLINICAL DATA:  Nausea EXAM: PORTABLE CHEST 1 VIEW COMPARISON:  12/13/2018 FINDINGS: Patchy bilateral pulmonary infiltrate suspicious for COVID-19 associated pneumonia in this patient with GI symptoms. Cardiomegaly. No effusion or pneumothorax. IMPRESSION: 1. Patchy bilateral pneumonia, please correlate for COVID-19 positivity. 2. Chronic cardiomegaly. Electronically Signed   By: Monte Fantasia M.D.   On: 07/26/2019 11:45    Procedures Procedures (including critical care time)  Medications Ordered in ED Medications  levofloxacin (LEVAQUIN) tablet 750 mg (has no administration in time range)  0.9 %  sodium chloride infusion ( Intravenous New Bag/Given 07/26/19 1312)  ondansetron (ZOFRAN) injection 4 mg (4 mg Intravenous Given 07/26/19 1140)  iohexol (OMNIPAQUE) 300 MG/ML solution 100 mL (80 mLs Intravenous Contrast Given 07/26/19 1318)    ED Course  I have reviewed the triage vital signs and the nursing notes.  Pertinent labs & imaging results that were available during my care of the patient were reviewed by me and considered in my medical decision making (see chart for  details).    MDM Rules/Calculators/A&P                      The patient is able to lift both legs, has normal grips bilaterally, she has normal-appearing mucous membranes.  I am concerned because of her abdominal tenderness and vomiting, would consider intra-abdominal sources however she is in a facility where there is lots of coronavirus as well.  We will attempt chest x-ray labs and likely needs a CT scan to make sure there is no signs of diverticulitis or complicating intra-abdominal process.  The patient is very slow to respond, I am not sure if this is altered mental status or her baseline however she seems to follow commands correctly without any obvious focal deficits.  The patient is looking better throughout her emergency department course.  Her blood work is unremarkable with no significant anemia, no significant leukocytosis, a creatinine of 1.19 with a BUN of 18 and electrolytes which are close to normal.  There is a minimal hyponatremia.  Her urinalysis reveals positive nitrites, 21-50 white blood cells in the presence of bacteria, this could be consistent with a urinary tract infection and that she will be given antibiotics.  That being said she also tested positive for Covid.  Thankfully she is not hypoxic or tachypneic.  She has stopped nauseated and vomiting and is feeling better at this time, she will be able to be discharged back to her facility.  Urinary culture pending, and due to her recent treatment with Keflex we will treat with a fluoroquinolone.  QT interval is normal  Final Clinical Impression(s) / ED Diagnoses Final diagnoses:  Non-intractable vomiting with nausea, unspecified vomiting type  Urinary tract infection without hematuria, site unspecified  COVID-19  Rx / DC Orders ED Discharge Orders         Ordered     07/26/19 1432    levofloxacin (LEVAQUIN) 750 MG tablet  Daily     07/26/19 1434    ondansetron (ZOFRAN ODT) 4 MG disintegrating tablet  Every 8  hours PRN     07/26/19 1437           Noemi Chapel, MD 07/26/19 1435    Noemi Chapel, MD 07/26/19 1437    Noemi Chapel, MD 07/26/19 1438

## 2019-07-27 ENCOUNTER — Telehealth: Payer: Self-pay | Admitting: Unknown Physician Specialty

## 2019-07-27 NOTE — Telephone Encounter (Signed)
Called to discuss with patient about Covid symptoms and the use of bamlanivimab, a monoclonal antibody infusion for those with mild to moderate Covid symptoms and at a high risk of hospitalization.  Pt is qualified for this infusion at the [facility] infusion center due to Age > 70     Message left with daughter to call back

## 2019-07-28 LAB — URINE CULTURE: Culture: >=100000 — AB

## 2019-07-29 ENCOUNTER — Telehealth: Payer: Self-pay | Admitting: Emergency Medicine

## 2019-07-29 ENCOUNTER — Emergency Department (HOSPITAL_COMMUNITY)
Admission: EM | Admit: 2019-07-29 | Discharge: 2019-07-29 | Disposition: A | Discharge status: Home / Self Care | Payer: Medicare Other | Attending: Emergency Medicine | Admitting: Emergency Medicine

## 2019-07-29 ENCOUNTER — Other Ambulatory Visit: Payer: Self-pay

## 2019-07-29 ENCOUNTER — Encounter (HOSPITAL_COMMUNITY): Payer: Self-pay

## 2019-07-29 DIAGNOSIS — J45909 Unspecified asthma, uncomplicated: Secondary | ICD-10-CM | POA: Insufficient documentation

## 2019-07-29 DIAGNOSIS — Z79899 Other long term (current) drug therapy: Secondary | ICD-10-CM | POA: Insufficient documentation

## 2019-07-29 DIAGNOSIS — Z794 Long term (current) use of insulin: Secondary | ICD-10-CM | POA: Insufficient documentation

## 2019-07-29 DIAGNOSIS — R531 Weakness: Secondary | ICD-10-CM

## 2019-07-29 DIAGNOSIS — I10 Essential (primary) hypertension: Secondary | ICD-10-CM | POA: Diagnosis not present

## 2019-07-29 DIAGNOSIS — Z96642 Presence of left artificial hip joint: Secondary | ICD-10-CM | POA: Insufficient documentation

## 2019-07-29 DIAGNOSIS — E119 Type 2 diabetes mellitus without complications: Secondary | ICD-10-CM | POA: Insufficient documentation

## 2019-07-29 DIAGNOSIS — J449 Chronic obstructive pulmonary disease, unspecified: Secondary | ICD-10-CM | POA: Diagnosis not present

## 2019-07-29 DIAGNOSIS — U071 COVID-19: Secondary | ICD-10-CM | POA: Diagnosis not present

## 2019-07-29 LAB — CBG MONITORING, ED: Glucose-Capillary: 100 mg/dL — ABNORMAL HIGH (ref 70–99)

## 2019-07-29 NOTE — Telephone Encounter (Signed)
Post ED Visit - Positive Culture Follow-up  Culture report reviewed by antimicrobial stewardship pharmacist: [place] Team []  Elenor Quinones, Pharm.D. []  Heide Guile, Pharm.D., BCPS AQ-ID []  [place]ks Neptune, Pharm.D., BCPS []  Alycia Rossetti, Pharm.D., BCPS []  [place], [place].D., BCPS, AAHIVP []  Legrand Como, Pharm.D., BCPS, AAHIVP []  Salome Arnt, PharmD, BCPS []  Johnnette Gourd, PharmD, BCPS []  Hughes Better, PharmD, BCPS []  Leeroy Cha, PharmD []  Laqueta Linden, PharmD, BCPS []  Albertina Parr, PharmD  [place] Team []  Leodis Sias, PharmD []  Lindell Spar, PharmD []  Royetta Asal, PharmD []  Graylin Shiver, Rph []  Rema Fendt) Glennon Mac, PharmD []  Arlyn Dunning, PharmD []  Netta Cedars, PharmD []  Dia Sitter, PharmD []  Leone Haven, PharmD []  Gretta Arab, PharmD []  Theodis Shove, PharmD []  Peggyann Juba, PharmD []  Reuel Boom, PharmD   Positive urine culture Treated with cephalexin and ciprofloxacin and levofloxacin, organism sensitive to the same and no further patient follow-up is required at this time.  Hazle Nordmann 07/29/2019, 12:17 PM

## 2019-07-29 NOTE — ED Provider Notes (Signed)
[facility] EMERGENCY DEPARTMENT Provider Note   CSN: [id number] Arrival date & time: 07/29/19  1659     History Chief Complaint  Patient presents with  . covid    Sheena [name] is a 85 y.o. female.  HPI    TEMESHIA GRISSOM is a 85 y.o. female who was diagnosed with COVID on 07/26/19 and has a PMH of DM, HTN, syncope and asthma.  She presents to the Emergency Department from [place] facility.  Per staff member at the facility, patient was taking a shower and had a brief episode in which her legs became stiff and she started "shaking all over" and "her eyes rolled back."  Episode lasted approximately 3 minutes per staff member.  Staff also states that she has been weak today and not very communicative but will answer when spoken to.  patient denies pain, shortness of breath and fall.  Nursing staff report hx of seizures, but this is not documented in her PMH.      Past Medical History:  Diagnosis Date  . Asthma    treated by Dr. Donneta Romberg  . Depression   . Diabetes mellitus, type 2 ([place])   . H/O: hysterectomy 1973   fibroids  . Hypertension   . Left arm pain   . Lumbago   . Melanoma ([place])    left arm, resected, annual CXR's ordered  . Syncope 08/2013   Suspected vagal-mediated    Patient Active Problem List   Diagnosis Date Noted  . HCAP (healthcare-associated pneumonia) 09/16/2018  . Syncope 05/11/2018  . COPD (chronic obstructive pulmonary disease) ([place]) 05/11/2018  . Acute respiratory failure with hypoxia ([place]) 05/07/2018  . Hypertension   . Diabetes mellitus, type 2 ([place])   . Cholelithiasis 12/11/2016  . Abdominal pain 12/11/2016  . Back pain, thoracic 10/14/2013  . Mild cognitive impairment with memory loss 10/04/2013  . Syncope and collapse 08/26/2013  . Other and unspecified hyperlipidemia 05/14/2013  . Unspecified constipation 05/14/2013  . Osteoarthritis of left hip 01/11/2013  . History of syncope 11/15/2012  . Ankle fracture, left  11/15/2012  . H/O acute renal failure 11/15/2012  . Proximal muscle weakness 10/30/2012  . Neurotic excoriations 09/02/2012  . Loss of balance 06/06/2012  . Melanoma ([place])   . Asthma   . Insomnia, persistent 09/01/2011  . Severe major depression with psychotic features ([place]) 09/01/2011  . Obesity (BMI 30-39.9) 09/01/2011  . Obesity, diabetes, and hypertension syndrome ([place]) 09/01/2011  . COPD with acute exacerbation ([place]) 01/13/2008    Past Surgical History:  Procedure Laterality Date  . ABDOMINAL HYSTERECTOMY  1973   fibroid uterus  . BREAST BIOPSY  1985   normal  . JOINT REPLACEMENT   2003   Left Hip  . TOTAL HIP ARTHROPLASTY  2003   left, Dr. Durward Fortes  . TRANSTHORACIC ECHOCARDIOGRAM  08/23/13   EF 60-65%, borderline concentric LVH, impaired LV diastolic relaxation, LA ULN     OB History   No obstetric history on file.     Family History  Problem Relation Age of Onset  . Diabetes Mother   . Heart disease Mother        CHF  . Heart disease Father   . Diabetes Father     Social History   Tobacco Use  . Smoking status: Never Smoker  . Smokeless tobacco: Never Used  Substance Use Topics  . Alcohol use: No    Comment: occasional  . Drug use: No  Home Medications Prior to Admission medications   Medication Sig Start Date End Date Taking? Authorizing Provider  albuterol (PROVENTIL HFA;VENTOLIN HFA) 108 (90 Base) MCG/ACT inhaler Inhale 1 puff into the lungs every 6 (six) hours as needed for wheezing or shortness of breath.    [provider]  albuterol (PROVENTIL) (2.5 MG/3ML) 0.083% nebulizer solution Take 3 mLs (2.5 mg total) by nebulization every 6 (six) hours. 09/18/18   Kathie Dike, MD  ALPRAZolam [name]) 0.5 MG tablet Take 1 tablet (0.5 mg total) by mouth daily as needed for anxiety. 05/13/18   Johnson, Clanford L, MD  amLODipine (NORVASC) 5 MG tablet Take 1 tablet (5 mg total) by mouth daily. 09/19/18   Kathie Dike, MD  atorvastatin  (LIPITOR) 40 MG tablet TAKE ONE TABLET BY MOUTH EVERY DAY. Patient taking differently: Take 40 mg by mouth every evening.  06/12/14   [name], MD  Insulin Glargine (LANTUS SOLOSTAR) 100 UNIT/ML Solostar Pen Inject 25 Units into the skin 2 (two) times daily. Patient taking differently: Inject 15 Units into the skin 2 (two) times daily.  05/09/18   Kathie Dike, MD  levofloxacin (LEVAQUIN) 750 MG tablet Take 1 tablet (750 mg total) by mouth daily. 07/26/19   Noemi Chapel, MD  loratadine (CLARITIN) 10 MG tablet Take 1 tablet (10 mg total) by mouth daily. 05/09/18 07/26/19  Kathie Dike, MD  ondansetron (ZOFRAN ODT) 4 MG disintegrating tablet Take 1 tablet (4 mg total) by mouth every 8 (eight) hours as needed for nausea. 07/26/19   Noemi Chapel, MD  QUEtiapine (SEROQUEL) 25 MG tablet Take 25 mg by mouth 2 (two) times daily.    [provider]  sertraline (ZOLOFT) 50 MG tablet Take 50 mg by mouth daily.    [provider]  tiotropium (SPIRIVA HANDIHALER) 18 MCG inhalation capsule Place 1 capsule (18 mcg total) into inhaler and inhale daily. 05/09/18 07/26/19  Kathie Dike, MD  Calcium Carbonate (CALCIUM 500 PO) Take by mouth.  04/30/16  [provider]    Allergies    Patient has no known allergies.  Review of Systems   Review of Systems  Constitutional: Negative for appetite change, chills and fever.  HENT: Negative for congestion.   Respiratory: Negative for cough and shortness of breath.   Cardiovascular: Negative for chest pain and leg swelling.  Gastrointestinal: Negative for abdominal pain, diarrhea, nausea and vomiting.  Musculoskeletal: Negative for arthralgias and neck pain.  Neurological: Positive for seizures and weakness. Negative for dizziness, facial asymmetry and headaches.    Physical Exam Updated Vital Signs BP 139/76 (BP Location: Left Arm)   Pulse 81   Temp 98.6 F (37 C) (Oral)   Resp 20   Ht 5\' 6"  (1.676 m)   Wt 105 kg    SpO2 97%   BMI 37.36 kg/m   Physical Exam Vitals and nursing note reviewed.  Constitutional:      Appearance: Normal appearance. She is not ill-appearing or toxic-appearing.  HENT:     Head: Atraumatic.     Mouth/Throat:     Mouth: Mucous membranes are moist.  Eyes:     Extraocular Movements: Extraocular movements intact.     Conjunctiva/sclera: Conjunctivae normal.     Pupils: Pupils are equal, round, and reactive to light.  Cardiovascular:     Rate and Rhythm: Normal rate and regular rhythm.     Pulses: Normal pulses.  Pulmonary:     Effort: Pulmonary effort is normal.     Breath  sounds: Normal breath sounds. No stridor. No wheezing.  Chest:     Chest wall: No tenderness.  Abdominal:     General: There is no distension.     Palpations: Abdomen is soft.     Tenderness: There is no abdominal tenderness. There is no guarding.  Musculoskeletal:     Cervical back: Normal range of motion. No rigidity or tenderness.     Right lower leg: No edema.     Left lower leg: No edema.  Skin:    General: Skin is warm.     Capillary Refill: Capillary refill takes less than 2 seconds.  Neurological:     Mental Status: She is alert.     Sensory: Sensation is intact.     Motor: Motor function is intact.     Coordination: Coordination normal.     Comments: CN II-XII grossly intact.  Speech clear.  Pt follows commands well.  Does not appear post-ictal.       ED Results / Procedures / Treatments   Labs (all labs ordered are listed, but only abnormal results are displayed) Labs Reviewed  CBG MONITORING, ED - Abnormal; Notable for the following components:      Result Value   Glucose-Capillary 100 (*)    All other components within normal limits    EKG None  Radiology No results found.  Procedures Procedures (including critical care time)  Medications Ordered in ED Medications - No data to display  ED Course  I have reviewed the triage vital signs and the nursing  notes.  Pertinent labs & imaging results that were available during my care of the patient were reviewed by me and considered in my medical decision making (see chart for details).    MDM Rules/Calculators/A&P                      Spoke with Glenard Haring, nursing staff member at [facility] who described pt's symptoms.  Confirms that pt generally speaks only when spoken to and that is consistent with my exam.  No focal neurological deficits here and she does not appear post ictal.     Pt also seen by Dr. Lacinda Axon and care plan discussed.  1920  Pt has been observed in the dept for a length of time w/o complications.  Vitals reassuring.  Reported generalized weakness is c/w recent COVID diagnosis.  No witnessed seizure activity here and she is not post ictal appearing.  Pt is felt to be medically clear to return to nursing facility.    Final Clinical Impression(s) / ED Diagnoses Final diagnoses:  Weakness generalized  COVID-19 virus infection    Rx / DC Orders ED Discharge Orders    None       Kem Parkinson, PA-C 07/30/19 Greer Pickerel, MD 07/30/19 [phone]

## 2019-07-29 NOTE — Discharge Instructions (Addendum)
Sheena [name] will need to follow-up with her primary care provider next week for recheck. Return here if her symptoms worsens

## 2019-07-29 NOTE — ED Triage Notes (Signed)
EMS reports pt resident of [facility] and is covid positive.  Reports staff was given pt a shower and she "went stiff" x 3 min.  EMS arrived and pt was alert and oriented sitting in shower chair.  No evidence of being post ictal.  CBG 101, bp 117/68, hr 73, o2 sat 96% on room air.  Pt tested positive for covid on 12/16.

## 2020-04-10 ENCOUNTER — Encounter (HOSPITAL_COMMUNITY): Payer: Self-pay

## 2020-04-10 ENCOUNTER — Other Ambulatory Visit: Payer: Self-pay

## 2020-04-10 ENCOUNTER — Emergency Department (HOSPITAL_COMMUNITY)
Admission: EM | Admit: 2020-04-10 | Discharge: 2020-04-10 | Disposition: A | Discharge status: Skilled Nursing Facility | Payer: Medicare Other | Attending: Emergency Medicine | Admitting: Emergency Medicine

## 2020-04-10 DIAGNOSIS — E119 Type 2 diabetes mellitus without complications: Secondary | ICD-10-CM | POA: Insufficient documentation

## 2020-04-10 DIAGNOSIS — S50812A Abrasion of left forearm, initial encounter: Secondary | ICD-10-CM | POA: Diagnosis not present

## 2020-04-10 DIAGNOSIS — J441 Chronic obstructive pulmonary disease with (acute) exacerbation: Secondary | ICD-10-CM | POA: Insufficient documentation

## 2020-04-10 DIAGNOSIS — S59912A Unspecified injury of left forearm, initial encounter: Secondary | ICD-10-CM | POA: Diagnosis present

## 2020-04-10 DIAGNOSIS — J45909 Unspecified asthma, uncomplicated: Secondary | ICD-10-CM | POA: Diagnosis not present

## 2020-04-10 DIAGNOSIS — L539 Erythematous condition, unspecified: Secondary | ICD-10-CM | POA: Diagnosis not present

## 2020-04-10 DIAGNOSIS — Z96652 Presence of left artificial knee joint: Secondary | ICD-10-CM | POA: Insufficient documentation

## 2020-04-10 DIAGNOSIS — Y929 Unspecified place or not applicable: Secondary | ICD-10-CM | POA: Diagnosis not present

## 2020-04-10 DIAGNOSIS — Y939 Activity, unspecified: Secondary | ICD-10-CM | POA: Diagnosis not present

## 2020-04-10 DIAGNOSIS — Z79899 Other long term (current) drug therapy: Secondary | ICD-10-CM | POA: Diagnosis not present

## 2020-04-10 DIAGNOSIS — I10 Essential (primary) hypertension: Secondary | ICD-10-CM | POA: Diagnosis not present

## 2020-04-10 DIAGNOSIS — S50811A Abrasion of right forearm, initial encounter: Secondary | ICD-10-CM | POA: Insufficient documentation

## 2020-04-10 DIAGNOSIS — Z794 Long term (current) use of insulin: Secondary | ICD-10-CM | POA: Diagnosis not present

## 2020-04-10 DIAGNOSIS — X58XXXA Exposure to other specified factors, initial encounter: Secondary | ICD-10-CM | POA: Diagnosis not present

## 2020-04-10 DIAGNOSIS — Y999 Unspecified external cause status: Secondary | ICD-10-CM | POA: Diagnosis not present

## 2020-04-10 DIAGNOSIS — L03113 Cellulitis of right upper limb: Secondary | ICD-10-CM | POA: Insufficient documentation

## 2020-04-10 LAB — CBG MONITORING, ED: Glucose-Capillary: 104 mg/dL — ABNORMAL HIGH (ref 70–99)

## 2020-04-10 MED ORDER — SULFAMETHOXAZOLE-TRIMETHOPRIM 800-160 MG PO TABS
1.0000 | ORAL_TABLET | Freq: Once | ORAL | Status: AC
  Administered 2020-04-10: 1 via ORAL
  Filled 2020-04-10: qty 1

## 2020-04-10 MED ORDER — BACITRACIN-NEOMYCIN-POLYMYXIN 400-5-5000 EX OINT
TOPICAL_OINTMENT | Freq: Once | CUTANEOUS | Status: AC
  Filled 2020-04-10: qty 1

## 2020-04-10 NOTE — ED Triage Notes (Signed)
Pt presents to ED via [facility] for wound check on bilateral arms. Pt from [facility]

## 2020-04-10 NOTE — ED Provider Notes (Signed)
[facility] EMERGENCY DEPARTMENT Provider Note   CSN: [id number] Arrival date & time: 04/10/20  1441     History Chief Complaint  Patient presents with  . Wound Check    Sheena [name] is a 86 y.o. female presenting for evaluation of a wound on her right forearm.  Upon call to [facility] RN with [facility] she endorses that this patient has a habit of chronically "picking" her arms causing scabs and abrasions.  Over the past several days the wound on her right forearm has become erythematous and there is concerned about possible infection at the site.  Patient is a poor history giver with her dementia and does not offer any further information.  Patient has no known allergies.  She has no treatments prior to arrival for this condition.  The history is provided by the patient.       Past Medical History:  Diagnosis Date  . Asthma    treated by Dr. Donneta Romberg  . Depression   . Diabetes mellitus, type 2 ([place])   . H/O: hysterectomy 1973   fibroids  . Hypertension   . Left arm pain   . Lumbago   . Melanoma ([place])    left arm, resected, annual CXR's ordered  . Syncope 08/2013   Suspected vagal-mediated    Patient Active Problem List   Diagnosis Date Noted  . HCAP (healthcare-associated pneumonia) 09/16/2018  . Syncope 05/11/2018  . COPD (chronic obstructive pulmonary disease) ([place]) 05/11/2018  . Acute respiratory failure with hypoxia ([place]) 05/07/2018  . Hypertension   . Diabetes mellitus, type 2 ([place])   . Cholelithiasis 12/11/2016  . Abdominal pain 12/11/2016  . Back pain, thoracic 10/14/2013  . Mild cognitive impairment with memory loss 10/04/2013  . Syncope and collapse 08/26/2013  . Other and unspecified hyperlipidemia 05/14/2013  . Unspecified constipation 05/14/2013  . Osteoarthritis of left hip 01/11/2013  . History of syncope 11/15/2012  . Ankle fracture, left 11/15/2012  . H/O acute renal failure 11/15/2012  . Proximal muscle weakness 10/30/2012  . Neurotic  excoriations 09/02/2012  . Loss of balance 06/06/2012  . Melanoma ([place])   . Asthma   . Insomnia, persistent 09/01/2011  . Severe major depression with psychotic features ([place]) 09/01/2011  . Obesity (BMI 30-39.9) 09/01/2011  . Obesity, diabetes, and hypertension syndrome ([place]) 09/01/2011  . COPD with acute exacerbation ([place]) 01/13/2008    Past Surgical History:  Procedure Laterality Date  . ABDOMINAL HYSTERECTOMY  1973   fibroid uterus  . BREAST BIOPSY  1985   normal  . JOINT REPLACEMENT   2003   Left Hip  . TOTAL HIP ARTHROPLASTY  2003   left, Dr. Durward Fortes  . TRANSTHORACIC ECHOCARDIOGRAM  08/23/13   EF 60-65%, borderline concentric LVH, impaired LV diastolic relaxation, LA ULN     OB History   No obstetric history on file.     Family History  Problem Relation Age of Onset  . Diabetes Mother   . Heart disease Mother        CHF  . Heart disease Father   . Diabetes Father     Social History   Tobacco Use  . Smoking status: Never Smoker  . Smokeless tobacco: Never Used  Substance Use Topics  . Alcohol use: No    Comment: occasional  . Drug use: No    Home Medications Prior to Admission medications   Medication Sig Start Date End Date Taking? Authorizing Provider  albuterol (PROVENTIL HFA;VENTOLIN HFA) 108 (90  Base) MCG/ACT inhaler Inhale 1 puff into the lungs every 6 (six) hours as needed for wheezing or shortness of breath.    [provider]  albuterol (PROVENTIL) (2.5 MG/3ML) 0.083% nebulizer solution Take 3 mLs (2.5 mg total) by nebulization every 6 (six) hours. 09/18/18   Kathie Dike, MD  ALPRAZolam [name]) 0.5 MG tablet Take 1 tablet (0.5 mg total) by mouth daily as needed for anxiety. 05/13/18   Johnson, Clanford L, MD  amLODipine (NORVASC) 5 MG tablet Take 1 tablet (5 mg total) by mouth daily. 09/19/18   Kathie Dike, MD  atorvastatin (LIPITOR) 40 MG tablet TAKE ONE TABLET BY MOUTH EVERY DAY. Patient taking differently: Take 40 mg by mouth  every evening.  06/12/14   [name], MD  Insulin Glargine (LANTUS SOLOSTAR) 100 UNIT/ML Solostar Pen Inject 25 Units into the skin 2 (two) times daily. Patient taking differently: Inject 15 Units into the skin 2 (two) times daily.  05/09/18   Kathie Dike, MD  levofloxacin (LEVAQUIN) 750 MG tablet Take 1 tablet (750 mg total) by mouth daily. 07/26/19   Noemi Chapel, MD  loratadine (CLARITIN) 10 MG tablet Take 1 tablet (10 mg total) by mouth daily. 05/09/18 07/29/19  Kathie Dike, MD  ondansetron (ZOFRAN ODT) 4 MG disintegrating tablet Take 1 tablet (4 mg total) by mouth every 8 (eight) hours as needed for nausea. 07/26/19   Noemi Chapel, MD  QUEtiapine (SEROQUEL) 25 MG tablet Take 25 mg by mouth 2 (two) times daily.    [provider]  sertraline (ZOLOFT) 50 MG tablet Take 50 mg by mouth daily.    [provider]  tiotropium (SPIRIVA HANDIHALER) 18 MCG inhalation capsule Place 1 capsule (18 mcg total) into inhaler and inhale daily. 05/09/18 07/29/19  Kathie Dike, MD  Calcium Carbonate (CALCIUM 500 PO) Take by mouth.  04/30/16  [provider]    Allergies    Patient has no known allergies.  Review of Systems   Review of Systems  Constitutional: Negative for chills and fever.  Respiratory: Negative for shortness of breath and wheezing.   Skin: Positive for color change and wound.  Neurological: Negative for numbness.    Physical Exam Updated Vital Signs BP 136/65 (BP Location: Right Arm)   Pulse 74   Temp 98.5 F (36.9 C) (Oral)   Resp 18   Ht 5\' 3"  (1.6 m)   Wt 90.7 kg   SpO2 94%   BMI 35.43 kg/m   Physical Exam Constitutional:      General: She is not in acute distress.    Appearance: She is well-developed. She is not ill-appearing.  HENT:     Head: Normocephalic.  Cardiovascular:     Rate and Rhythm: Normal rate.  Pulmonary:     Effort: Pulmonary effort is normal.     Breath sounds: No wheezing.  Musculoskeletal:         General: Normal range of motion.     Cervical back: Neck supple.  Skin:    Findings: Erythema present. No rash.     Comments: Areas of dorsal forearm.  There is an approximate 1 cm open abrasion peripheral scabbing.  There is also some erythema, the wound is moist without purulent drainage.  There is no red streaking.  There is a smaller abrasion left mid forearm without drainage or surrounding erythema.  Neurological:     Mental Status: She is alert.     ED Results / Procedures / Treatments   Labs (  all labs ordered are listed, but only abnormal results are displayed) Labs Reviewed  CBG MONITORING, ED - Abnormal; Notable for the following components:      Result Value   Glucose-Capillary 104 (*)    All other components within normal limits    EKG None  Radiology No results found.  Procedures Procedures (including critical care time)  Medications Ordered in ED Medications  neomycin-bacitracin-polymyxin (NEOSPORIN) ointment packet (has no administration in time range)  sulfamethoxazole-trimethoprim (BACTRIM DS) 800-160 MG per tablet 1 tablet (has no administration in time range)    ED Course  I have reviewed the triage vital signs and the nursing notes.  Pertinent labs & imaging results that were available during my care of the patient were reviewed by me and considered in my medical decision making (see chart for details).    MDM Rules/Calculators/A&P                          Patient with a mild early cellulitis of her right forearm, excoriations abrasions of bilateral forearms.  She was started on Bactrim with first dose given here.  Also given topical treatment with Neosporin, dressings applied.  Plan follow-up with her primary MD for further management if symptoms persist or worsen.   Final Clinical Impression(s) / ED Diagnoses Final diagnoses:  Cellulitis of right upper extremity    Rx / DC Orders ED Discharge Orders    None       Landis Martins 04/10/20 Lorri Frederick, MD 04/11/20 [phone]

## 2020-06-03 ENCOUNTER — Encounter (HOSPITAL_COMMUNITY): Payer: Self-pay

## 2020-06-03 ENCOUNTER — Emergency Department (HOSPITAL_COMMUNITY)
Admission: EM | Admit: 2020-06-03 | Discharge: 2020-06-03 | Disposition: A | Discharge status: Home / Self Care | Payer: Medicare Other | Attending: Emergency Medicine | Admitting: Emergency Medicine

## 2020-06-03 ENCOUNTER — Other Ambulatory Visit: Payer: Self-pay

## 2020-06-03 DIAGNOSIS — R1110 Vomiting, unspecified: Secondary | ICD-10-CM | POA: Diagnosis present

## 2020-06-03 DIAGNOSIS — D72829 Elevated white blood cell count, unspecified: Secondary | ICD-10-CM | POA: Insufficient documentation

## 2020-06-03 DIAGNOSIS — J45909 Unspecified asthma, uncomplicated: Secondary | ICD-10-CM | POA: Insufficient documentation

## 2020-06-03 DIAGNOSIS — Z79899 Other long term (current) drug therapy: Secondary | ICD-10-CM | POA: Diagnosis not present

## 2020-06-03 DIAGNOSIS — N3001 Acute cystitis with hematuria: Secondary | ICD-10-CM | POA: Diagnosis not present

## 2020-06-03 DIAGNOSIS — R319 Hematuria, unspecified: Secondary | ICD-10-CM

## 2020-06-03 DIAGNOSIS — D649 Anemia, unspecified: Secondary | ICD-10-CM | POA: Insufficient documentation

## 2020-06-03 DIAGNOSIS — Z85820 Personal history of malignant melanoma of skin: Secondary | ICD-10-CM | POA: Diagnosis not present

## 2020-06-03 DIAGNOSIS — R112 Nausea with vomiting, unspecified: Secondary | ICD-10-CM | POA: Diagnosis not present

## 2020-06-03 DIAGNOSIS — I10 Essential (primary) hypertension: Secondary | ICD-10-CM | POA: Diagnosis not present

## 2020-06-03 DIAGNOSIS — N390 Urinary tract infection, site not specified: Secondary | ICD-10-CM

## 2020-06-03 DIAGNOSIS — Z96642 Presence of left artificial hip joint: Secondary | ICD-10-CM | POA: Insufficient documentation

## 2020-06-03 DIAGNOSIS — Z794 Long term (current) use of insulin: Secondary | ICD-10-CM | POA: Diagnosis not present

## 2020-06-03 DIAGNOSIS — J441 Chronic obstructive pulmonary disease with (acute) exacerbation: Secondary | ICD-10-CM | POA: Diagnosis not present

## 2020-06-03 DIAGNOSIS — E119 Type 2 diabetes mellitus without complications: Secondary | ICD-10-CM | POA: Insufficient documentation

## 2020-06-03 HISTORY — DX: Hyperlipidemia, unspecified: E78.5

## 2020-06-03 LAB — URINALYSIS, ROUTINE W REFLEX MICROSCOPIC
Bilirubin Urine: NEGATIVE
Glucose, UA: NEGATIVE mg/dL
Ketones, ur: NEGATIVE mg/dL
Nitrite: NEGATIVE
Protein, ur: 30 mg/dL — AB
RBC / HPF: >50 RBC/hpf — ABNORMAL HIGH (ref 0–5)
Specific Gravity, Urine: 1.017 (ref 1.005–1.030)
WBC, UA: >50 WBC/hpf — ABNORMAL HIGH (ref 0–5)
pH: 5 (ref 5.0–8.0)

## 2020-06-03 LAB — CBC WITH DIFFERENTIAL/PLATELET
Abs Immature Granulocytes: 0.06 10*3/uL (ref 0.00–0.07)
Basophils Absolute: 0 10*3/uL (ref 0.0–0.1)
Basophils Relative: 0 %
Eosinophils Absolute: 0.1 10*3/uL (ref 0.0–0.5)
Eosinophils Relative: 1 %
HCT: 34.3 % — ABNORMAL LOW (ref 36.0–46.0)
Hemoglobin: 10.8 g/dL — ABNORMAL LOW (ref 12.0–15.0)
Immature Granulocytes: 1 %
Lymphocytes Relative: 9 %
Lymphs Abs: 1.2 10*3/uL (ref 0.7–4.0)
MCH: 29.8 pg (ref 26.0–34.0)
MCHC: 31.5 g/dL (ref 30.0–36.0)
MCV: 94.8 fL (ref 80.0–100.0)
Monocytes Absolute: 1.1 10*3/uL — ABNORMAL HIGH (ref 0.1–1.0)
Monocytes Relative: 9 %
Neutro Abs: 10.2 10*3/uL — ABNORMAL HIGH (ref 1.7–7.7)
Neutrophils Relative %: 80 %
Platelets: 190 10*3/uL (ref 150–400)
RBC: 3.62 MIL/uL — ABNORMAL LOW (ref 3.87–5.11)
RDW: 13 % (ref 11.5–15.5)
WBC: 12.6 10*3/uL — ABNORMAL HIGH (ref 4.0–10.5)
nRBC: 0 % (ref 0.0–0.2)

## 2020-06-03 LAB — COMPREHENSIVE METABOLIC PANEL
ALT: 23 U/L (ref 0–44)
AST: 31 U/L (ref 15–41)
Albumin: 3.5 g/dL (ref 3.5–5.0)
Alkaline Phosphatase: 84 U/L (ref 38–126)
Anion gap: 6 (ref 5–15)
BUN: 30 mg/dL — ABNORMAL HIGH (ref 8–23)
CO2: 24 mmol/L (ref 22–32)
Calcium: 8.4 mg/dL — ABNORMAL LOW (ref 8.9–10.3)
Chloride: 102 mmol/L (ref 98–111)
Creatinine, Ser: 1.2 mg/dL — ABNORMAL HIGH (ref 0.44–1.00)
GFR, Estimated: 42 mL/min — ABNORMAL LOW (ref >60)
Glucose, Bld: 186 mg/dL — ABNORMAL HIGH (ref 70–99)
Potassium: 4.2 mmol/L (ref 3.5–5.1)
Sodium: 132 mmol/L — ABNORMAL LOW (ref 135–145)
Total Bilirubin: 0.5 mg/dL (ref 0.3–1.2)
Total Protein: 8.4 g/dL — ABNORMAL HIGH (ref 6.5–8.1)

## 2020-06-03 LAB — CBG MONITORING, ED: Glucose-Capillary: 180 mg/dL — ABNORMAL HIGH (ref 70–99)

## 2020-06-03 MED ORDER — ONDANSETRON 4 MG PO TBDP
4.0000 mg | ORAL_TABLET | Freq: Once | ORAL | Status: AC
  Administered 2020-06-03: 4 mg via ORAL
  Filled 2020-06-03: qty 1

## 2020-06-03 MED ORDER — CEPHALEXIN 500 MG PO CAPS
500.0000 mg | ORAL_CAPSULE | Freq: Once | ORAL | Status: AC
  Administered 2020-06-03: 500 mg via ORAL
  Filled 2020-06-03: qty 1

## 2020-06-03 MED ORDER — CEPHALEXIN 500 MG PO CAPS: 500.0000 mg | ORAL_CAPSULE | Freq: Two times a day (BID) | ORAL | 0 refills | Status: AC

## 2020-06-03 MED ORDER — ONDANSETRON 4 MG PO TBDP: 4.0000 mg | ORAL_TABLET | Freq: Three times a day (TID) | ORAL | 0 refills | Status: DC | PRN

## 2020-06-03 NOTE — ED Notes (Signed)
Report given to [facility]. Waiting on pt transportation

## 2020-06-03 NOTE — ED Triage Notes (Signed)
Pt brought to ED via [facility] for emesis. Pt from [facility]. Pt vomited x 4 this am. Pt given zofran 4 mg IM by EMS. CBG 250. Pt c/o generalized abdominal pain.

## 2020-06-03 NOTE — Discharge Instructions (Signed)
Please take the medication called Zofran as needed for nausea, this can be taken every 8 hours as needed.  You should also take the medicine called cephalexin twice a day for the next 5 days to treat for the urinary tract infection that it does appear that you have.  All of your other tests have been reassuring.  Have your doctor recheck you within 3 days or return to the emergency department for worsening symptoms

## 2020-06-03 NOTE — ED Provider Notes (Signed)
[facility] EMERGENCY DEPARTMENT Provider Note   CSN: [id number] Arrival date & time: 06/03/20  1037     History Chief Complaint  Patient presents with  . Emesis    Sheena [name] is a 86 y.o. female.  HPI   This patient is a pleasant 86 year old female with a history of type 2 diabetes, she is currently insulin requiring, she has a history of hypertension and is coming to [place] from the [place] [facility] where she currently resides.  Apparently the patient states she was in her usual state of health this morning, she had her breakfast and shortly thereafter had multiple episodes of vomiting.  These totaled about approximately 4 episodes.  She is no longer nauseated.  She has no complaints of dysuria diarrhea back pain chest pain or shortness of breath, there is no headache or sore throat, no changes in vision.  She does report having a small amount of lower abdominal pain in the suprapubic and right lower quadrant.  He cannot tell me when that started, she states she is now feeling better and is no longer nauseated  Past Medical History:  Diagnosis Date  . Asthma    treated by Dr. Donneta Romberg  . Depression   . Diabetes mellitus, type 2 ([place])   . H/O: hysterectomy 1973   fibroids  . Hyperlipidemia   . Hypertension   . Left arm pain   . Lumbago   . Melanoma ([place])    left arm, resected, annual CXR's ordered  . Syncope 08/2013   Suspected vagal-mediated    Patient Active Problem List   Diagnosis Date Noted  . HCAP (healthcare-associated pneumonia) 09/16/2018  . Syncope 05/11/2018  . COPD (chronic obstructive pulmonary disease) ([place]) 05/11/2018  . Acute respiratory failure with hypoxia ([place]) 05/07/2018  . Hypertension   . Diabetes mellitus, type 2 ([place])   . Cholelithiasis 12/11/2016  . Abdominal pain 12/11/2016  . Back pain, thoracic 10/14/2013  . Mild cognitive impairment with memory loss 10/04/2013  . Syncope and collapse 08/26/2013  . Other and unspecified  hyperlipidemia 05/14/2013  . Unspecified constipation 05/14/2013  . Osteoarthritis of left hip 01/11/2013  . History of syncope 11/15/2012  . Ankle fracture, left 11/15/2012  . H/O acute renal failure 11/15/2012  . Proximal muscle weakness 10/30/2012  . Neurotic excoriations 09/02/2012  . Loss of balance 06/06/2012  . Melanoma ([place])   . Asthma   . Insomnia, persistent 09/01/2011  . Severe major depression with psychotic features ([place]) 09/01/2011  . Obesity (BMI 30-39.9) 09/01/2011  . Obesity, diabetes, and hypertension syndrome ([place]) 09/01/2011  . COPD with acute exacerbation ([place]) 01/13/2008    Past Surgical History:  Procedure Laterality Date  . ABDOMINAL HYSTERECTOMY  1973   fibroid uterus  . BREAST BIOPSY  1985   normal  . JOINT REPLACEMENT   2003   Left Hip  . TOTAL HIP ARTHROPLASTY  2003   left, Dr. Durward Fortes  . TRANSTHORACIC ECHOCARDIOGRAM  08/23/13   EF 60-65%, borderline concentric LVH, impaired LV diastolic relaxation, LA ULN     OB History   No obstetric history on file.     Family History  Problem Relation Age of Onset  . Diabetes Mother   . Heart disease Mother        CHF  . Heart disease Father   . Diabetes Father     Social History   Tobacco Use  . Smoking status: Never Smoker  . Smokeless tobacco: Never Used  Substance Use Topics  . Alcohol use: No    Comment: occasional  . Drug use: No    Home Medications Prior to Admission medications   Medication Sig Start Date End Date Taking? Authorizing Provider  albuterol (PROVENTIL HFA;VENTOLIN HFA) 108 (90 Base) MCG/ACT inhaler Inhale 1 puff into the lungs every 6 (six) hours as needed for wheezing or shortness of breath.    [provider]  albuterol (PROVENTIL) (2.5 MG/3ML) 0.083% nebulizer solution Take 3 mLs (2.5 mg total) by nebulization every 6 (six) hours. 09/18/18   Kathie Dike, MD  ALPRAZolam [name]) 0.5 MG tablet Take 1 tablet (0.5 mg total) by mouth daily as needed for  anxiety. 05/13/18   Johnson, Clanford L, MD  amLODipine (NORVASC) 5 MG tablet Take 1 tablet (5 mg total) by mouth daily. 09/19/18   Kathie Dike, MD  atorvastatin (LIPITOR) 40 MG tablet TAKE ONE TABLET BY MOUTH EVERY DAY. Patient taking differently: Take 40 mg by mouth every evening.  06/12/14   [name], MD  cephALEXin (KEFLEX) 500 MG capsule Take 1 capsule (500 mg total) by mouth 2 (two) times daily for 5 days. 06/03/20 06/08/20  Noemi Chapel, MD  Insulin Glargine (LANTUS SOLOSTAR) 100 UNIT/ML Solostar Pen Inject 25 Units into the skin 2 (two) times daily. Patient taking differently: Inject 15 Units into the skin 2 (two) times daily.  05/09/18   Kathie Dike, MD  levofloxacin (LEVAQUIN) 750 MG tablet Take 1 tablet (750 mg total) by mouth daily. 07/26/19   Noemi Chapel, MD  loratadine (CLARITIN) 10 MG tablet Take 1 tablet (10 mg total) by mouth daily. 05/09/18 07/29/19  Kathie Dike, MD  ondansetron (ZOFRAN ODT) 4 MG disintegrating tablet Take 1 tablet (4 mg total) by mouth every 8 (eight) hours as needed for nausea. 06/03/20   Noemi Chapel, MD  QUEtiapine (SEROQUEL) 25 MG tablet Take 25 mg by mouth 2 (two) times daily.    [provider]  sertraline (ZOLOFT) 50 MG tablet Take 50 mg by mouth daily.    [provider]  tiotropium (SPIRIVA HANDIHALER) 18 MCG inhalation capsule Place 1 capsule (18 mcg total) into inhaler and inhale daily. 05/09/18 07/29/19  Kathie Dike, MD  Calcium Carbonate (CALCIUM 500 PO) Take by mouth.  04/30/16  [provider]    Allergies    Patient has no known allergies.  Review of Systems   Review of Systems  All other systems reviewed and are negative.   Physical Exam Updated Vital Signs BP 139/60   Pulse 73   Temp 98.4 F (36.9 C) (Oral)   Resp 13   Ht 1.651 m (5\' 5" )   Wt 104.3 kg   SpO2 100%   BMI 38.27 kg/m   Physical Exam Vitals and nursing note reviewed.  Constitutional:      General: She is not in  acute distress.    Appearance: She is well-developed.  HENT:     Head: Normocephalic and atraumatic.     Mouth/Throat:     Pharynx: No oropharyngeal exudate.  Eyes:     General: No scleral icterus.       Right eye: No discharge.        Left eye: No discharge.     Conjunctiva/sclera: Conjunctivae normal.     Pupils: Pupils are equal, round, and reactive to light.  Neck:     Thyroid: No thyromegaly.     Vascular: No JVD.  Cardiovascular:     Rate and Rhythm: Normal  rate and regular rhythm.     Heart sounds: Normal heart sounds. No murmur heard.  No friction rub. No gallop.   Pulmonary:     Effort: Pulmonary effort is normal. No respiratory distress.     Breath sounds: Normal breath sounds. No wheezing or rales.  Abdominal:     General: Bowel sounds are normal. There is no distension.     Palpations: Abdomen is soft. There is no mass.     Tenderness: There is abdominal tenderness.     Comments: Minimal lower abdominal tenderness, it is nonfocal, no McBurney's point tenderness, no Murphy sign, no peritoneal signs  Musculoskeletal:        General: No tenderness. Normal range of motion.     Cervical back: Normal range of motion and neck supple.  Lymphadenopathy:     Cervical: No cervical adenopathy.  Skin:    General: Skin is warm and dry.     Findings: No erythema or rash.  Neurological:     Mental Status: She is alert.     Coordination: Coordination normal.  Psychiatric:        Behavior: Behavior normal.     ED Results / Procedures / Treatments   Labs (all labs ordered are listed, but only abnormal results are displayed) Labs Reviewed  COMPREHENSIVE METABOLIC PANEL - Abnormal; Notable for the following components:      Result Value   Sodium 132 (*)    Glucose, Bld 186 (*)    BUN 30 (*)    Creatinine, Ser 1.20 (*)    Calcium 8.4 (*)    Total Protein 8.4 (*)    GFR, Estimated 42 (*)    All other components within normal limits  CBC WITH DIFFERENTIAL/PLATELET -  Abnormal; Notable for the following components:   WBC 12.6 (*)    RBC 3.62 (*)    Hemoglobin 10.8 (*)    HCT 34.3 (*)    Neutro Abs 10.2 (*)    Monocytes Absolute 1.1 (*)    All other components within normal limits  URINALYSIS, ROUTINE W REFLEX MICROSCOPIC - Abnormal; Notable for the following components:   APPearance HAZY (*)    Hgb urine dipstick LARGE (*)    Protein, ur 30 (*)    Leukocytes,Ua MODERATE (*)    RBC / HPF >50 (*)    WBC, UA >50 (*)    Bacteria, UA RARE (*)    All other components within normal limits  CBG MONITORING, ED - Abnormal; Notable for the following components:   Glucose-Capillary 180 (*)    All other components within normal limits  URINE CULTURE    EKG EKG Interpretation  Date/Time:  Monday June 03 2020 10:47:52 EDT Ventricular Rate:  70 PR Interval:    QRS Duration: 88 QT Interval:  438 QTC Calculation: 473 R Axis:   -25 Text Interpretation: Sinus rhythm Low voltage, precordial leads Abnormal R-wave progression, early transition Probable LVH with secondary repol abnrm Anterior Q waves, possibly due to LVH Confirmed by Noemi Chapel [phone]) on 06/03/2020 11:17:44 AM   Radiology No results found.  Procedures Procedures (including critical care time)  Medications Ordered in ED Medications  ondansetron (ZOFRAN-ODT) disintegrating tablet 4 mg (4 mg Oral Given 06/03/20 1143)  cephALEXin (KEFLEX) capsule 500 mg (500 mg Oral Given 06/03/20 1311)    ED Course  I have reviewed the triage vital signs and the nursing notes.  Pertinent labs & imaging results that were available during my care of the  patient were reviewed by me and considered in my medical decision making (see chart for details).    MDM Rules/Calculators/A&P                          This patient's EKG is unremarkable, her vital signs are unremarkable, she is feeling better, it is not clear what caused her nausea but we will proceed with some basic labs given her lower  abdominal pain including a urinalysis and blood counts.  The patient is agreeable, Zofran ordered  UTI present on testing, there is a mild leukocytosis, mild anemia, electrolytes are essentially normal, renal function is essentially preserved.  Patient is well-appearing with no further nausea or vomiting.  Started cephalexin, Zofran for nausea, stable for discharge  Final Clinical Impression(s) / ED Diagnoses Final diagnoses:  Urinary tract infection with hematuria, site unspecified  Non-intractable vomiting with nausea, unspecified vomiting type    Rx / DC Orders ED Discharge Orders         Ordered    cephALEXin (KEFLEX) 500 MG capsule  2 times daily        06/03/20 1356    ondansetron (ZOFRAN ODT) 4 MG disintegrating tablet  Every 8 hours PRN        06/03/20 1356           Noemi Chapel, MD 06/03/20 1406

## 2020-06-06 LAB — URINE CULTURE: Culture: >=100000 — AB

## 2020-06-07 ENCOUNTER — Telehealth: Payer: Self-pay | Admitting: *Deleted

## 2020-06-07 NOTE — Telephone Encounter (Signed)
Post ED Visit - Positive Culture Follow-up  Culture report reviewed by antimicrobial stewardship pharmacist: [place] Team []  Elenor Quinones, Pharm.D. []  Heide Guile, Pharm.D., BCPS AQ-ID []  Parks Neptune, Pharm.D., BCPS []  Alycia Rossetti, Pharm.D., BCPS []  [place], Pharm.D., BCPS, AAHIVP []  Legrand Como, Pharm.D., BCPS, AAHIVP [x]  Salome Arnt, PharmD, BCPS []  Johnnette Gourd, PharmD, BCPS []  Hughes Better, PharmD, BCPS []  Leeroy Cha, PharmD []  Laqueta Linden, PharmD, BCPS []  Albertina Parr, PharmD  [place] Team []  Leodis Sias, PharmD []  Lindell Spar, PharmD []  Royetta Asal, PharmD []  Graylin Shiver, Rph []  Rema Fendt) Glennon Mac, PharmD []  Arlyn Dunning, PharmD []  Netta Cedars, PharmD []  Dia Sitter, PharmD []  Leone Haven, PharmD []  Gretta Arab, PharmD []  Theodis Shove, PharmD []  Peggyann Juba, PharmD []  Reuel Boom, PharmD   Positive urine culture Treated with Cephalexin, organism sensitive to the same and no further patient follow-up is required at this time.  Harlon Flor [facility] 06/07/2020, 9:01 AM

## 2020-10-20 ENCOUNTER — Encounter (HOSPITAL_COMMUNITY): Payer: Self-pay

## 2020-10-20 ENCOUNTER — Emergency Department (HOSPITAL_COMMUNITY)
Admission: EM | Admit: 2020-10-20 | Discharge: 2020-10-20 | Disposition: A | Discharge status: Home / Self Care | Payer: Medicare Other | Attending: Emergency Medicine | Admitting: Emergency Medicine

## 2020-10-20 ENCOUNTER — Emergency Department (HOSPITAL_COMMUNITY): Payer: Medicare Other

## 2020-10-20 DIAGNOSIS — Z79899 Other long term (current) drug therapy: Secondary | ICD-10-CM | POA: Insufficient documentation

## 2020-10-20 DIAGNOSIS — Z23 Encounter for immunization: Secondary | ICD-10-CM | POA: Diagnosis not present

## 2020-10-20 DIAGNOSIS — M545 Low back pain, unspecified: Secondary | ICD-10-CM | POA: Insufficient documentation

## 2020-10-20 DIAGNOSIS — Y92129 Unspecified place in nursing home as the place of occurrence of the external cause: Secondary | ICD-10-CM | POA: Diagnosis not present

## 2020-10-20 DIAGNOSIS — S50811A Abrasion of right forearm, initial encounter: Secondary | ICD-10-CM | POA: Insufficient documentation

## 2020-10-20 DIAGNOSIS — M25551 Pain in right hip: Secondary | ICD-10-CM | POA: Insufficient documentation

## 2020-10-20 DIAGNOSIS — S0990XA Unspecified injury of head, initial encounter: Secondary | ICD-10-CM | POA: Diagnosis not present

## 2020-10-20 DIAGNOSIS — Z96642 Presence of left artificial hip joint: Secondary | ICD-10-CM | POA: Insufficient documentation

## 2020-10-20 DIAGNOSIS — S80212A Abrasion, left knee, initial encounter: Secondary | ICD-10-CM | POA: Diagnosis not present

## 2020-10-20 DIAGNOSIS — J45909 Unspecified asthma, uncomplicated: Secondary | ICD-10-CM | POA: Insufficient documentation

## 2020-10-20 DIAGNOSIS — E119 Type 2 diabetes mellitus without complications: Secondary | ICD-10-CM | POA: Insufficient documentation

## 2020-10-20 DIAGNOSIS — J441 Chronic obstructive pulmonary disease with (acute) exacerbation: Secondary | ICD-10-CM | POA: Diagnosis not present

## 2020-10-20 DIAGNOSIS — I10 Essential (primary) hypertension: Secondary | ICD-10-CM | POA: Diagnosis not present

## 2020-10-20 DIAGNOSIS — M25561 Pain in right knee: Secondary | ICD-10-CM | POA: Diagnosis not present

## 2020-10-20 DIAGNOSIS — T07XXXA Unspecified multiple injuries, initial encounter: Secondary | ICD-10-CM

## 2020-10-20 DIAGNOSIS — Z794 Long term (current) use of insulin: Secondary | ICD-10-CM | POA: Insufficient documentation

## 2020-10-20 DIAGNOSIS — W19XXXA Unspecified fall, initial encounter: Secondary | ICD-10-CM | POA: Diagnosis not present

## 2020-10-20 MED ORDER — TETANUS-DIPHTH-ACELL PERTUSSIS 5-2.5-18.5 LF-MCG/0.5 IM SUSY
0.5000 mL | PREFILLED_SYRINGE | Freq: Once | INTRAMUSCULAR | Status: AC
  Administered 2020-10-20: 0.5 mL via INTRAMUSCULAR
  Filled 2020-10-20: qty 0.5

## 2020-10-20 MED ORDER — BACITRACIN-NEOMYCIN-POLYMYXIN 400-5-5000 EX OINT
TOPICAL_OINTMENT | Freq: Once | CUTANEOUS | Status: AC
  Filled 2020-10-20: qty 1

## 2020-10-20 NOTE — Progress Notes (Signed)
[facility] notified of d/c; transportation is on the way

## 2020-10-20 NOTE — ED Provider Notes (Signed)
[facility] EMERGENCY DEPARTMENT Provider Note   CSN: [id number] Arrival date & time: 10/20/20  1301     History Chief Complaint  Patient presents with  . Fall    Sheena [name] is a 87 y.o. female.  The history is provided by the patient.  Fall This is a new problem. The current episode started 1 to 2 hours ago. The problem has not changed since onset.Pertinent negatives include no headaches. Exacerbated by: movement. She has tried nothing for the symptoms.   Patient presenting from her local nursing home for evaluation after a fall.  She had a witnessed fall in which she got caught in her pants cough and tripped, landing on her right side.  She endorses hitting the right side of her head but did not have any LOC.  She denies head or neck pain.  Not on blood thinners.  She does endorse pain at her right forearm, right hip and bilateral knees.  She has had no treatment prior to arrival.    Past Medical History:  Diagnosis Date  . Asthma    treated by Dr. Donneta Romberg  . Depression   . Diabetes mellitus, type 2 ([place])   . H/O: hysterectomy 1973   fibroids  . Hyperlipidemia   . Hypertension   . Left arm pain   . Lumbago   . Melanoma ([place])    left arm, resected, annual CXR's ordered  . Syncope 08/2013   Suspected vagal-mediated    Patient Active Problem List   Diagnosis Date Noted  . HCAP (healthcare-associated pneumonia) 09/16/2018  . Syncope 05/11/2018  . COPD (chronic obstructive pulmonary disease) ([place]) 05/11/2018  . Acute respiratory failure with hypoxia ([place]) 05/07/2018  . Hypertension   . Diabetes mellitus, type 2 ([place])   . Cholelithiasis 12/11/2016  . Abdominal pain 12/11/2016  . Back pain, thoracic 10/14/2013  . Mild cognitive impairment with memory loss 10/04/2013  . Syncope and collapse 08/26/2013  . Other and unspecified hyperlipidemia 05/14/2013  . Unspecified constipation 05/14/2013  . Osteoarthritis of left hip 01/11/2013  . History of syncope 11/15/2012   . Ankle fracture, left 11/15/2012  . H/O acute renal failure 11/15/2012  . Proximal muscle weakness 10/30/2012  . Neurotic excoriations 09/02/2012  . Loss of balance 06/06/2012  . Melanoma ([place])   . Asthma   . Insomnia, persistent 09/01/2011  . Severe major depression with psychotic features ([place]) 09/01/2011  . Obesity (BMI 30-39.9) 09/01/2011  . Obesity, diabetes, and hypertension syndrome ([place]) 09/01/2011  . COPD with acute exacerbation ([place]) 01/13/2008    Past Surgical History:  Procedure Laterality Date  . ABDOMINAL HYSTERECTOMY  1973   fibroid uterus  . BREAST BIOPSY  1985   normal  . JOINT REPLACEMENT   2003   Left Hip  . TOTAL HIP ARTHROPLASTY  2003   left, Dr. Durward Fortes  . TRANSTHORACIC ECHOCARDIOGRAM  08/23/13   EF 60-65%, borderline concentric LVH, impaired LV diastolic relaxation, LA ULN     OB History   No obstetric history on file.     Family History  Problem Relation Age of Onset  . Diabetes Mother   . Heart disease Mother        CHF  . Heart disease Father   . Diabetes Father     Social History   Tobacco Use  . Smoking status: Never Smoker  . Smokeless tobacco: Never Used  Substance Use Topics  . Alcohol use: No    Comment: occasional  . Drug  use: No    Home Medications Prior to Admission medications   Medication Sig Start Date End Date Taking? Authorizing Provider  albuterol (PROVENTIL HFA;VENTOLIN HFA) 108 (90 Base) MCG/ACT inhaler Inhale 1 puff into the lungs every 6 (six) hours as needed for wheezing or shortness of breath.   Yes [provider]  albuterol (PROVENTIL) (2.5 MG/3ML) 0.083% nebulizer solution Take 3 mLs (2.5 mg total) by nebulization every 6 (six) hours. Patient taking differently: Take 2.5 mg by nebulization 3 (three) times daily as needed for wheezing or shortness of breath. 09/18/18  Yes Kathie Dike, MD  ALPRAZolam [name]) 0.5 MG tablet Take 1 tablet (0.5 mg total) by mouth daily as needed for anxiety. 05/13/18   Yes Johnson, Clanford L, MD  amLODipine (NORVASC) 5 MG tablet Take 1 tablet (5 mg total) by mouth daily. 09/19/18  Yes Kathie Dike, MD  atorvastatin (LIPITOR) 40 MG tablet TAKE ONE TABLET BY MOUTH EVERY DAY. Patient taking differently: Take 40 mg by mouth every evening. 06/12/14  Yes [name], MD  guaiFENesin (MUCINEX) 600 MG 12 hr tablet Take 600 mg by mouth 2 (two) times daily as needed for cough or to loosen phlegm.   Yes [provider]  Insulin Glargine (LANTUS SOLOSTAR) 100 UNIT/ML Solostar Pen Inject 25 Units into the skin 2 (two) times daily. Patient taking differently: Inject 15 Units into the skin 2 (two) times daily. 05/09/18  Yes Kathie Dike, MD  loratadine (CLARITIN) 10 MG tablet Take 1 tablet (10 mg total) by mouth daily. 05/09/18 07/29/19 Yes Kathie Dike, MD  neomycin-bacitracin-polymyxin (NEOSPORIN) [phone] ointment Apply 1 application topically in the morning and at bedtime. Apply to affected area(s) arms   Yes [provider]  ondansetron (ZOFRAN ODT) 4 MG disintegrating tablet Take 1 tablet (4 mg total) by mouth every 8 (eight) hours as needed for nausea. 06/03/20  Yes Noemi Chapel, MD  QUEtiapine (SEROQUEL) 25 MG tablet Take 25 mg by mouth 2 (two) times daily.   Yes [provider]  sertraline (ZOLOFT) 50 MG tablet Take 50 mg by mouth daily.   Yes [provider]  tiotropium (SPIRIVA HANDIHALER) 18 MCG inhalation capsule Place 1 capsule (18 mcg total) into inhaler and inhale daily. 05/09/18 07/29/19 Yes Kathie Dike, MD  triamcinolone (KENALOG) 0.1 % Apply 1 application topically 2 (two) times daily as needed (rash/itching).   Yes [provider]  levofloxacin (LEVAQUIN) 750 MG tablet Take 1 tablet (750 mg total) by mouth daily. Patient not taking: Reported on 10/20/2020 07/26/19   Noemi Chapel, MD  Calcium Carbonate (CALCIUM 500 PO) Take by mouth.  04/30/16  [provider]    Allergies    Patient has  no known allergies.  Review of Systems   Review of Systems  Constitutional: Negative for fever.  Gastrointestinal: Negative for nausea and vomiting.  Musculoskeletal: Positive for arthralgias. Negative for joint swelling and myalgias.  Skin: Positive for wound.  Neurological: Negative for weakness, numbness and headaches.  All other systems reviewed and are negative.   Physical Exam Updated Vital Signs BP 132/68   Pulse 74   Temp 98.1 F (36.7 C) (Oral)   Resp 12   Ht 5\' 5"  (1.651 m)   Wt 104.3 kg   SpO2 96%   BMI 38.27 kg/m   Physical Exam Vitals and nursing note reviewed.  Constitutional:      Appearance: She is well-developed.  HENT:     Head: Normocephalic and atraumatic.  Comments: Mild tenderness to palpation at the right parietal scalp, no hematoma or abrasion.    Mouth/Throat:     Mouth: Mucous membranes are moist.  Eyes:     Conjunctiva/sclera: Conjunctivae normal.  Neck:     Comments: Denies midline tenderness. Cardiovascular:     Rate and Rhythm: Normal rate and regular rhythm.     Heart sounds: Normal heart sounds.  Pulmonary:     Effort: Pulmonary effort is normal.     Breath sounds: Normal breath sounds. No wheezing.  Abdominal:     General: Bowel sounds are normal.     Palpations: Abdomen is soft.     Tenderness: There is no abdominal tenderness.  Musculoskeletal:        General: Normal range of motion.     Cervical back: Normal range of motion. No tenderness.     Comments: Small abrasions noted at the left patella and right dorsal forearm.  Hemostatic.  Tender to palpation at the sites.  She also has tenderness to palpation at her right hip at the greater trochanter.  She has no no pain with range of motion of her hips.  She is tender to palpation and with attempted range of motion of the bilateral knees.  There is no obvious deformity.  No external rotation or shortening of either leg.  Skin:    General: Skin is warm and dry.  Neurological:      Mental Status: She is alert.     ED Results / Procedures / Treatments   Labs (all labs ordered are listed, but only abnormal results are displayed) Labs Reviewed - No data to display  EKG None  Radiology DG Forearm Right  Result Date: 10/20/2020 CLINICAL DATA:  Fall, forearm pain EXAM: RIGHT FOREARM - 2 VIEW COMPARISON:  02/19/2017 FINDINGS: There is no evidence of fracture or other focal bone lesions. Soft tissue swelling the dorsal aspect of the mid forearm. IMPRESSION: Soft tissue swelling without acute osseous abnormality of the right forearm. Electronically Signed   By: Davina Poke D.O.   On: 10/20/2020 15:05   CT Head Wo Contrast  Result Date: 10/20/2020 CLINICAL DATA:  Fall at home today EXAM: CT HEAD WITHOUT CONTRAST CT CERVICAL SPINE WITHOUT CONTRAST TECHNIQUE: Multidetector CT imaging of the head and cervical spine was performed following the standard protocol without intravenous contrast. Multiplanar CT image reconstructions of the cervical spine were also generated. COMPARISON:  12/28/2018 FINDINGS: CT HEAD FINDINGS Brain: No evidence of acute infarction, hemorrhage, hydrocephalus, extra-axial collection or mass lesion/mass effect. Periventricular and deep white matter hypodensity. Vascular: No hyperdense vessel or unexpected calcification. Skull: Normal. Negative for fracture or focal lesion. Sinuses/Orbits: No acute finding. Other: None. CT CERVICAL SPINE FINDINGS Alignment: Normal. Skull base and vertebrae: No acute fracture. No primary bone lesion or focal pathologic process. Soft tissues and spinal canal: No prevertebral fluid or swelling. No visible canal hematoma. Disc levels: Severe disc space height loss and osteophytosis from C4 through C7. Upper chest: Negative. Other: None. IMPRESSION: 1. No acute intracranial pathology. Small-vessel white matter disease. 2. No fracture or static subluxation of the cervical spine. 3. Severe disc space height loss and osteophytosis  from C4 through C7. Electronically Signed   By: Eddie Candle M.D.   On: 10/20/2020 14:44   CT Cervical Spine Wo Contrast  Result Date: 10/20/2020 CLINICAL DATA:  Fall at home today EXAM: CT HEAD WITHOUT CONTRAST CT CERVICAL SPINE WITHOUT CONTRAST TECHNIQUE: Multidetector CT imaging of the head and cervical  spine was performed following the standard protocol without intravenous contrast. Multiplanar CT image reconstructions of the cervical spine were also generated. COMPARISON:  12/28/2018 FINDINGS: CT HEAD FINDINGS Brain: No evidence of acute infarction, hemorrhage, hydrocephalus, extra-axial collection or mass lesion/mass effect. Periventricular and deep white matter hypodensity. Vascular: No hyperdense vessel or unexpected calcification. Skull: Normal. Negative for fracture or focal lesion. Sinuses/Orbits: No acute finding. Other: None. CT CERVICAL SPINE FINDINGS Alignment: Normal. Skull base and vertebrae: No acute fracture. No primary bone lesion or focal pathologic process. Soft tissues and spinal canal: No prevertebral fluid or swelling. No visible canal hematoma. Disc levels: Severe disc space height loss and osteophytosis from C4 through C7. Upper chest: Negative. Other: None. IMPRESSION: 1. No acute intracranial pathology. Small-vessel white matter disease. 2. No fracture or static subluxation of the cervical spine. 3. Severe disc space height loss and osteophytosis from C4 through C7. Electronically Signed   By: Eddie Candle M.D.   On: 10/20/2020 14:44   DG Knee Complete 4 Views Left  Result Date: 10/20/2020 CLINICAL DATA:  Pain. EXAM: LEFT KNEE - COMPLETE 4+ VIEW COMPARISON:  None. FINDINGS: No evidence of fracture, dislocation, or joint effusion. No evidence of arthropathy or other focal bone abnormality. Soft tissues are unremarkable. IMPRESSION: Negative. Electronically Signed   By: Dorise Bullion III M.D   On: 10/20/2020 15:06   DG Knee Complete 4 Views Right  Result Date:  10/20/2020 CLINICAL DATA:  Pain after tripping. EXAM: RIGHT KNEE - COMPLETE 4+ VIEW COMPARISON:  None. FINDINGS: Mild degenerative changes in the medial and lateral compartments. No fracture, dislocation, or joint effusion. IMPRESSION: Mild degenerative changes.  No fractures. Electronically Signed   By: Dorise Bullion III M.D   On: 10/20/2020 15:05   DG Hips Bilat W or Wo Pelvis 3-4 Views  Result Date: 10/20/2020 CLINICAL DATA:  Pain. EXAM: DG HIP (WITH OR WITHOUT PELVIS) 3-4V BILAT COMPARISON:  None. FINDINGS: The patient is status post left hip replacement. Visualized hardware is in good position. No fractures or dislocations. IMPRESSION: Negative. Electronically Signed   By: Dorise Bullion III M.D   On: 10/20/2020 15:07    Procedures Procedures   Medications Ordered in ED Medications  neomycin-bacitracin-polymyxin (NEOSPORIN) ointment packet (has no administration in time range)  Tdap (BOOSTRIX) injection 0.5 mL (has no administration in time range)    ED Course  I have reviewed the triage vital signs and the nursing notes.  Pertinent labs & imaging results that were available during my care of the patient were reviewed by me and considered in my medical decision making (see chart for details).    MDM Rules/Calculators/A&P                          Imaging reviewed and discussed with patient, no acute injuries found.  She was given a wound care for her abrasions and her tetanus was updated.  Return precautions were outlined.  As needed follow-up anticipated.  Patient will be transported back to her nursing facility. Final Clinical Impression(s) / ED Diagnoses Final diagnoses:  Fall, initial encounter  Multiple abrasions  Mild closed head injury, initial encounter    Rx / DC Orders ED Discharge Orders    None       Landis Martins 10/20/20 1538    Horton, Alvin Critchley, DO 10/28/20 1511

## 2020-10-20 NOTE — ED Notes (Signed)
Pt. Is dressed and ready for transport

## 2020-10-20 NOTE — Discharge Instructions (Addendum)
Xrays and CT imaging are negative today for acute injury (no fractures, dislocations or brain injury).  Expect to be sore for the next week.  Take tylenol if needed for pain relief.  Ice packs applied to your knee and forearm can also help with pain and swelling.  Your tetanus vaccine has been updated today.  Clean your abrasions twice daily with mild soap and water, rinse and dry completely.  You may add an antibiotic ointment of choice (neosporin for example) if desired.

## 2020-10-20 NOTE — ED Triage Notes (Signed)
Pt arrived via EMS after a fall. States she "tripped on pant leg" denies hitting her head or LOC. C/o lower back pain.

## 2020-12-19 IMAGING — CR DG CHEST 1V
1 series · 1 of 1 positions shown · non-contrast
Comparison: 05/26/2018

CLINICAL DATA: Cough.  Altered mental status.

EXAM:
CHEST  1 VIEW

[ap]
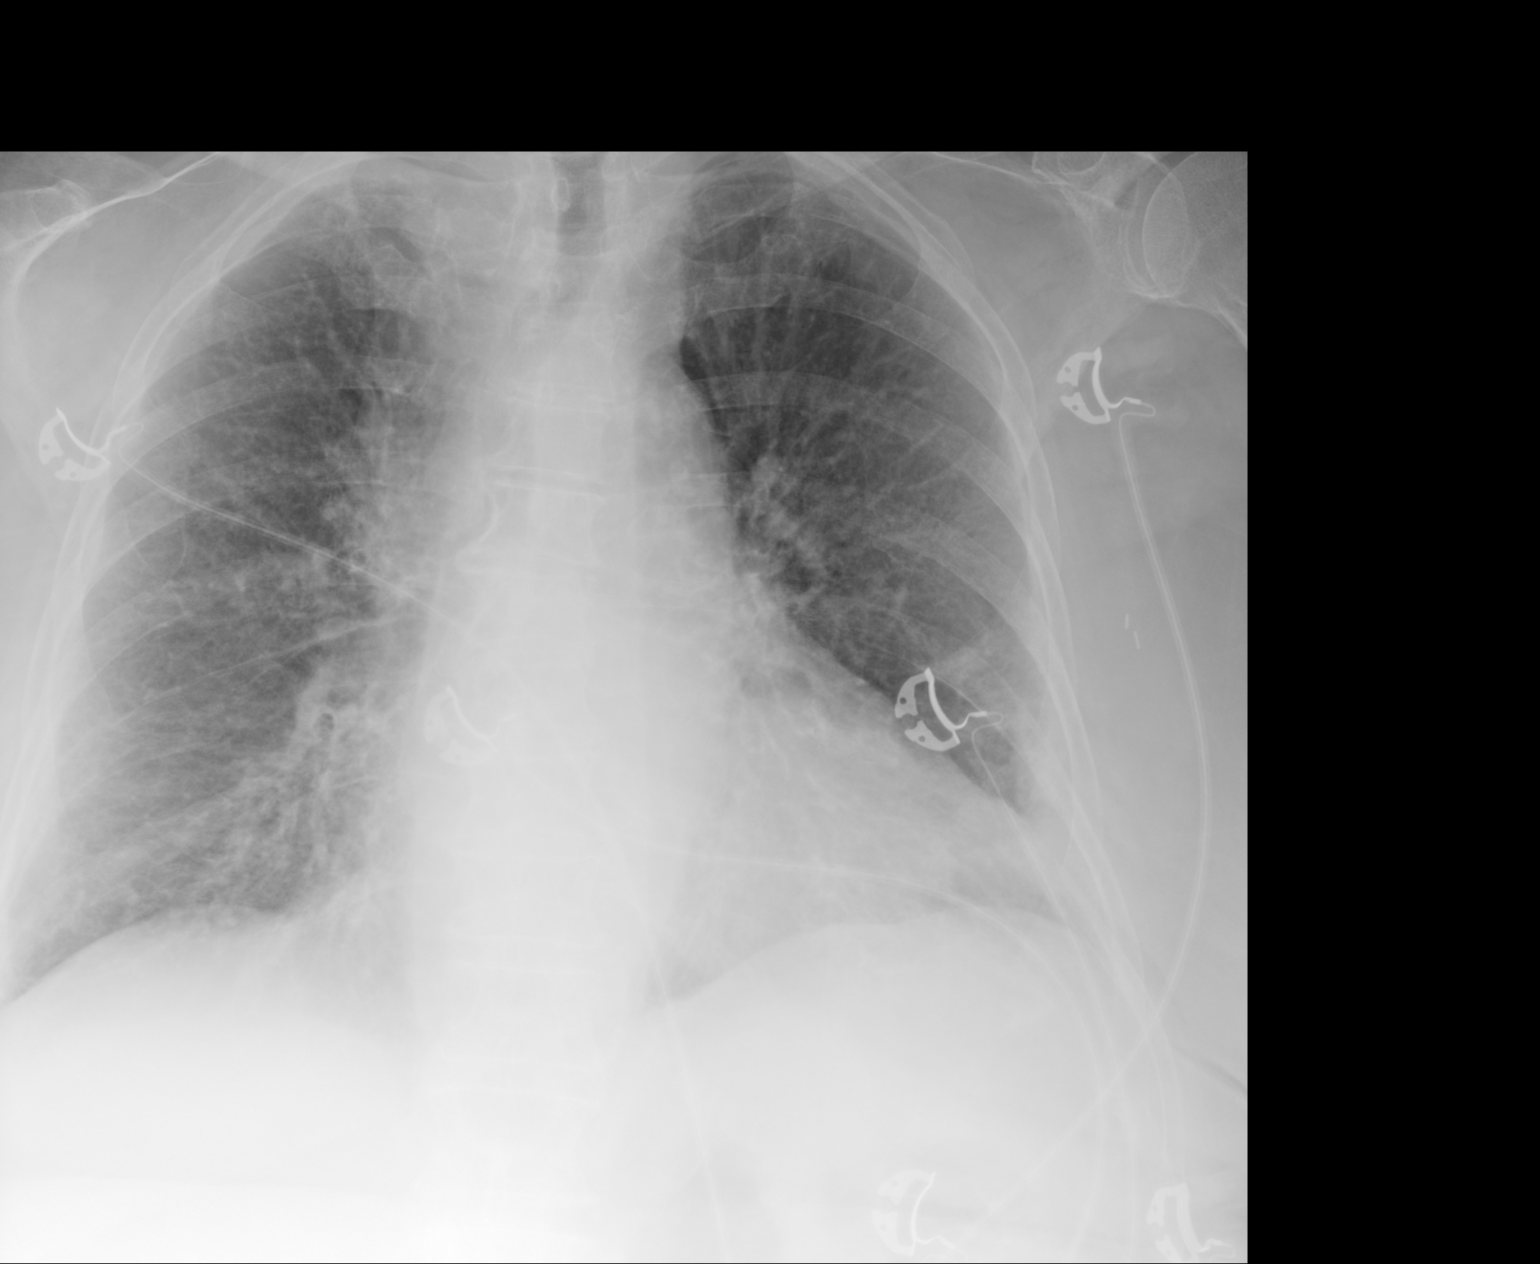

[1 of 1 positions shown; findings below may reference images not displayed]

FINDINGS: The heart is moderately enlarged. Vascular congestion. No sign of
interstitial edema. No pneumothorax or pleural effusion. Lungs are
under aerated with hypoaeration at the lung bases.
IMPRESSION: Cardiomegaly and vascular congestion without pulmonary edema.

## 2021-12-09 ENCOUNTER — Other Ambulatory Visit (HOSPITAL_COMMUNITY): Payer: Self-pay | Admitting: Gerontology

## 2021-12-09 ENCOUNTER — Ambulatory Visit (HOSPITAL_COMMUNITY)
Admission: RE | Admit: 2021-12-09 | Discharge: 2021-12-09 | Disposition: A | Discharge status: Home / Self Care | Payer: Medicare Other | Source: Ambulatory Visit | Attending: Gerontology | Admitting: Gerontology

## 2021-12-09 DIAGNOSIS — J441 Chronic obstructive pulmonary disease with (acute) exacerbation: Secondary | ICD-10-CM

## 2022-01-06 ENCOUNTER — Other Ambulatory Visit (HOSPITAL_COMMUNITY): Payer: Self-pay | Admitting: Gerontology

## 2022-01-06 ENCOUNTER — Ambulatory Visit (HOSPITAL_COMMUNITY)
Admission: RE | Admit: 2022-01-06 | Discharge: 2022-01-06 | Disposition: A | Discharge status: Home / Self Care | Payer: Medicare Other | Source: Ambulatory Visit | Attending: Gerontology | Admitting: Gerontology

## 2022-01-06 DIAGNOSIS — J441 Chronic obstructive pulmonary disease with (acute) exacerbation: Secondary | ICD-10-CM | POA: Diagnosis present

## 2022-07-10 ENCOUNTER — Emergency Department (HOSPITAL_COMMUNITY): Payer: Medicare Other

## 2022-07-10 ENCOUNTER — Inpatient Hospital Stay (HOSPITAL_COMMUNITY)
Admission: EM | Admit: 2022-07-10 | Discharge: 2022-07-24 | DRG: 535 | Disposition: A | Discharge status: Skilled Nursing Facility | Payer: Medicare Other | Source: Skilled Nursing Facility | Attending: Internal Medicine | Admitting: Internal Medicine

## 2022-07-10 ENCOUNTER — Other Ambulatory Visit: Payer: Self-pay

## 2022-07-10 DIAGNOSIS — Z66 Do not resuscitate: Secondary | ICD-10-CM | POA: Diagnosis present

## 2022-07-10 DIAGNOSIS — Z8249 Family history of ischemic heart disease and other diseases of the circulatory system: Secondary | ICD-10-CM | POA: Diagnosis not present

## 2022-07-10 DIAGNOSIS — F0393 Unspecified dementia, unspecified severity, with mood disturbance: Secondary | ICD-10-CM | POA: Diagnosis present

## 2022-07-10 DIAGNOSIS — S32592D Other specified fracture of left pubis, subsequent encounter for fracture with routine healing: Secondary | ICD-10-CM | POA: Diagnosis not present

## 2022-07-10 DIAGNOSIS — Z79899 Other long term (current) drug therapy: Secondary | ICD-10-CM

## 2022-07-10 DIAGNOSIS — Y92121 Bathroom in nursing home as the place of occurrence of the external cause: Secondary | ICD-10-CM | POA: Diagnosis not present

## 2022-07-10 DIAGNOSIS — F32A Depression, unspecified: Secondary | ICD-10-CM | POA: Diagnosis present

## 2022-07-10 DIAGNOSIS — L8990 Pressure ulcer of unspecified site, unspecified stage: Secondary | ICD-10-CM | POA: Insufficient documentation

## 2022-07-10 DIAGNOSIS — Z96642 Presence of left artificial hip joint: Secondary | ICD-10-CM | POA: Diagnosis present

## 2022-07-10 DIAGNOSIS — S51012A Laceration without foreign body of left elbow, initial encounter: Secondary | ICD-10-CM | POA: Diagnosis present

## 2022-07-10 DIAGNOSIS — Z794 Long term (current) use of insulin: Secondary | ICD-10-CM | POA: Diagnosis not present

## 2022-07-10 DIAGNOSIS — N3000 Acute cystitis without hematuria: Secondary | ICD-10-CM | POA: Diagnosis not present

## 2022-07-10 DIAGNOSIS — Z9981 Dependence on supplemental oxygen: Secondary | ICD-10-CM

## 2022-07-10 DIAGNOSIS — E785 Hyperlipidemia, unspecified: Secondary | ICD-10-CM | POA: Diagnosis present

## 2022-07-10 DIAGNOSIS — Z7189 Other specified counseling: Secondary | ICD-10-CM | POA: Diagnosis not present

## 2022-07-10 DIAGNOSIS — N390 Urinary tract infection, site not specified: Secondary | ICD-10-CM | POA: Diagnosis present

## 2022-07-10 DIAGNOSIS — Z6827 Body mass index (BMI) 27.0-27.9, adult: Secondary | ICD-10-CM

## 2022-07-10 DIAGNOSIS — G9341 Metabolic encephalopathy: Secondary | ICD-10-CM | POA: Diagnosis present

## 2022-07-10 DIAGNOSIS — J8410 Pulmonary fibrosis, unspecified: Secondary | ICD-10-CM | POA: Diagnosis present

## 2022-07-10 DIAGNOSIS — Z515 Encounter for palliative care: Secondary | ICD-10-CM

## 2022-07-10 DIAGNOSIS — Y9301 Activity, walking, marching and hiking: Secondary | ICD-10-CM | POA: Diagnosis present

## 2022-07-10 DIAGNOSIS — J1282 Pneumonia due to coronavirus disease 2019: Secondary | ICD-10-CM | POA: Diagnosis not present

## 2022-07-10 DIAGNOSIS — D72829 Elevated white blood cell count, unspecified: Secondary | ICD-10-CM | POA: Diagnosis not present

## 2022-07-10 DIAGNOSIS — D638 Anemia in other chronic diseases classified elsewhere: Secondary | ICD-10-CM | POA: Diagnosis present

## 2022-07-10 DIAGNOSIS — W010XXA Fall on same level from slipping, tripping and stumbling without subsequent striking against object, initial encounter: Secondary | ICD-10-CM | POA: Diagnosis present

## 2022-07-10 DIAGNOSIS — R627 Adult failure to thrive: Secondary | ICD-10-CM | POA: Diagnosis present

## 2022-07-10 DIAGNOSIS — J449 Chronic obstructive pulmonary disease, unspecified: Secondary | ICD-10-CM | POA: Diagnosis not present

## 2022-07-10 DIAGNOSIS — J440 Chronic obstructive pulmonary disease with acute lower respiratory infection: Secondary | ICD-10-CM | POA: Diagnosis not present

## 2022-07-10 DIAGNOSIS — J9621 Acute and chronic respiratory failure with hypoxia: Secondary | ICD-10-CM | POA: Diagnosis present

## 2022-07-10 DIAGNOSIS — Z833 Family history of diabetes mellitus: Secondary | ICD-10-CM | POA: Diagnosis not present

## 2022-07-10 DIAGNOSIS — Z85820 Personal history of malignant melanoma of skin: Secondary | ICD-10-CM | POA: Diagnosis not present

## 2022-07-10 DIAGNOSIS — U071 COVID-19: Secondary | ICD-10-CM | POA: Diagnosis not present

## 2022-07-10 DIAGNOSIS — R296 Repeated falls: Secondary | ICD-10-CM | POA: Diagnosis present

## 2022-07-10 DIAGNOSIS — S2242XA Multiple fractures of ribs, left side, initial encounter for closed fracture: Secondary | ICD-10-CM | POA: Diagnosis present

## 2022-07-10 DIAGNOSIS — E119 Type 2 diabetes mellitus without complications: Secondary | ICD-10-CM | POA: Diagnosis not present

## 2022-07-10 DIAGNOSIS — S2242XD Multiple fractures of ribs, left side, subsequent encounter for fracture with routine healing: Secondary | ICD-10-CM | POA: Diagnosis not present

## 2022-07-10 DIAGNOSIS — E11649 Type 2 diabetes mellitus with hypoglycemia without coma: Secondary | ICD-10-CM | POA: Diagnosis present

## 2022-07-10 DIAGNOSIS — Z90710 Acquired absence of both cervix and uterus: Secondary | ICD-10-CM

## 2022-07-10 DIAGNOSIS — B961 Klebsiella pneumoniae [K. pneumoniae] as the cause of diseases classified elsewhere: Secondary | ICD-10-CM | POA: Diagnosis present

## 2022-07-10 DIAGNOSIS — R0902 Hypoxemia: Secondary | ICD-10-CM | POA: Diagnosis not present

## 2022-07-10 DIAGNOSIS — S32592A Other specified fracture of left pubis, initial encounter for closed fracture: Secondary | ICD-10-CM | POA: Diagnosis not present

## 2022-07-10 DIAGNOSIS — I10 Essential (primary) hypertension: Secondary | ICD-10-CM | POA: Diagnosis not present

## 2022-07-10 DIAGNOSIS — J189 Pneumonia, unspecified organism: Secondary | ICD-10-CM | POA: Diagnosis not present

## 2022-07-10 DIAGNOSIS — T40605A Adverse effect of unspecified narcotics, initial encounter: Secondary | ICD-10-CM | POA: Diagnosis present

## 2022-07-10 DIAGNOSIS — W19XXXA Unspecified fall, initial encounter: Principal | ICD-10-CM

## 2022-07-10 DIAGNOSIS — J470 Bronchiectasis with acute lower respiratory infection: Secondary | ICD-10-CM | POA: Diagnosis not present

## 2022-07-10 DIAGNOSIS — W1830XD Fall on same level, unspecified, subsequent encounter: Secondary | ICD-10-CM | POA: Diagnosis not present

## 2022-07-10 LAB — BASIC METABOLIC PANEL
Anion gap: 12 (ref 5–15)
BUN: 17 mg/dL (ref 8–23)
CO2: 27 mmol/L (ref 22–32)
Calcium: 8.3 mg/dL — ABNORMAL LOW (ref 8.9–10.3)
Chloride: 101 mmol/L (ref 98–111)
Creatinine, Ser: 1.19 mg/dL — ABNORMAL HIGH (ref 0.44–1.00)
GFR, Estimated: 42 mL/min — ABNORMAL LOW (ref >60)
Glucose, Bld: 103 mg/dL — ABNORMAL HIGH (ref 70–99)
Potassium: 3.5 mmol/L (ref 3.5–5.1)
Sodium: 140 mmol/L (ref 135–145)

## 2022-07-10 LAB — CBC WITH DIFFERENTIAL/PLATELET
Abs Immature Granulocytes: 0.23 10*3/uL — ABNORMAL HIGH (ref 0.00–0.07)
Basophils Absolute: 0 10*3/uL (ref 0.0–0.1)
Basophils Relative: 0 %
Eosinophils Absolute: 0 10*3/uL (ref 0.0–0.5)
Eosinophils Relative: 0 %
HCT: 29.9 % — ABNORMAL LOW (ref 36.0–46.0)
Hemoglobin: 9.8 g/dL — ABNORMAL LOW (ref 12.0–15.0)
Immature Granulocytes: 1 %
Lymphocytes Relative: 3 %
Lymphs Abs: 0.7 10*3/uL (ref 0.7–4.0)
MCH: 29.8 pg (ref 26.0–34.0)
MCHC: 32.8 g/dL (ref 30.0–36.0)
MCV: 90.9 fL (ref 80.0–100.0)
Monocytes Absolute: 1.1 10*3/uL — ABNORMAL HIGH (ref 0.1–1.0)
Monocytes Relative: 6 %
Neutro Abs: 18.4 10*3/uL — ABNORMAL HIGH (ref 1.7–7.7)
Neutrophils Relative %: 90 %
Platelets: 202 10*3/uL (ref 150–400)
RBC: 3.29 MIL/uL — ABNORMAL LOW (ref 3.87–5.11)
RDW: 15.4 % (ref 11.5–15.5)
WBC: 20.4 10*3/uL — ABNORMAL HIGH (ref 4.0–10.5)
nRBC: 0 % (ref 0.0–0.2)

## 2022-07-10 LAB — URINALYSIS, ROUTINE W REFLEX MICROSCOPIC
Bilirubin Urine: NEGATIVE
Glucose, UA: NEGATIVE mg/dL
Hgb urine dipstick: NEGATIVE
Ketones, ur: 5 mg/dL — AB
Nitrite: POSITIVE — AB
Protein, ur: 100 mg/dL — AB
Specific Gravity, Urine: 1.016 (ref 1.005–1.030)
WBC, UA: >50 WBC/hpf — ABNORMAL HIGH (ref 0–5)
pH: 5 (ref 5.0–8.0)

## 2022-07-10 LAB — CBG MONITORING, ED
Glucose-Capillary: 110 mg/dL — ABNORMAL HIGH (ref 70–99)
Glucose-Capillary: 97 mg/dL (ref 70–99)

## 2022-07-10 LAB — GLUCOSE, CAPILLARY: Glucose-Capillary: 121 mg/dL — ABNORMAL HIGH (ref 70–99)

## 2022-07-10 LAB — BRAIN NATRIURETIC PEPTIDE: B Natriuretic Peptide: 58 pg/mL (ref 0.0–100.0)

## 2022-07-10 MED ORDER — SODIUM CHLORIDE 0.9 % IV SOLN
1.0000 g | INTRAVENOUS | Status: AC
  Administered 2022-07-11 – 2022-07-14 (×4): 1 g via INTRAVENOUS
  Filled 2022-07-10 (×5): qty 10

## 2022-07-10 MED ORDER — ONDANSETRON HCL 4 MG PO TABS: 4.0000 mg | ORAL_TABLET | Freq: Four times a day (QID) | ORAL | Status: DC | PRN

## 2022-07-10 MED ORDER — POLYETHYLENE GLYCOL 3350 17 G PO PACK: 17.0000 g | PACK | Freq: Every day | ORAL | Status: DC | PRN

## 2022-07-10 MED ORDER — ALBUTEROL SULFATE (2.5 MG/3ML) 0.083% IN NEBU: 2.5000 mg | INHALATION_SOLUTION | Freq: Four times a day (QID) | RESPIRATORY_TRACT | Status: DC | PRN

## 2022-07-10 MED ORDER — QUETIAPINE FUMARATE 25 MG PO TABS
12.5000 mg | ORAL_TABLET | Freq: Every day | ORAL | Status: DC
  Administered 2022-07-10 – 2022-07-23 (×14): 12.5 mg via ORAL
  Filled 2022-07-10 (×14): qty 1

## 2022-07-10 MED ORDER — SERTRALINE HCL 50 MG PO TABS
75.0000 mg | ORAL_TABLET | Freq: Every day | ORAL | Status: DC
  Administered 2022-07-10 – 2022-07-23 (×14): 75 mg via ORAL
  Filled 2022-07-10 (×14): qty 2

## 2022-07-10 MED ORDER — IPRATROPIUM-ALBUTEROL 0.5-2.5 (3) MG/3ML IN SOLN
3.0000 mL | Freq: Four times a day (QID) | RESPIRATORY_TRACT | Status: DC
  Administered 2022-07-10 – 2022-07-12 (×6): 3 mL via RESPIRATORY_TRACT
  Filled 2022-07-10 (×5): qty 3

## 2022-07-10 MED ORDER — INSULIN ASPART 100 UNIT/ML IJ SOLN
0.0000 [IU] | Freq: Three times a day (TID) | INTRAMUSCULAR | Status: DC
  Administered 2022-07-11: 5 [IU] via SUBCUTANEOUS
  Administered 2022-07-11: 2 [IU] via SUBCUTANEOUS
  Administered 2022-07-11: 3 [IU] via SUBCUTANEOUS
  Administered 2022-07-12: 5 [IU] via SUBCUTANEOUS
  Administered 2022-07-14: 2 [IU] via SUBCUTANEOUS
  Administered 2022-07-14: 3 [IU] via SUBCUTANEOUS
  Administered 2022-07-15: 2 [IU] via SUBCUTANEOUS
  Administered 2022-07-15: 3 [IU] via SUBCUTANEOUS
  Administered 2022-07-15: 2 [IU] via SUBCUTANEOUS
  Administered 2022-07-16 – 2022-07-17 (×5): 3 [IU] via SUBCUTANEOUS
  Administered 2022-07-17: 2 [IU] via SUBCUTANEOUS
  Administered 2022-07-18 (×3): 3 [IU] via SUBCUTANEOUS
  Administered 2022-07-19 (×2): 5 [IU] via SUBCUTANEOUS
  Administered 2022-07-19: 8 [IU] via SUBCUTANEOUS
  Administered 2022-07-20: 2 [IU] via SUBCUTANEOUS
  Administered 2022-07-20 (×2): 3 [IU] via SUBCUTANEOUS
  Administered 2022-07-21: 2 [IU] via SUBCUTANEOUS
  Administered 2022-07-21: 3 [IU] via SUBCUTANEOUS
  Administered 2022-07-22 (×2): 2 [IU] via SUBCUTANEOUS
  Administered 2022-07-23: 3 [IU] via SUBCUTANEOUS

## 2022-07-10 MED ORDER — GUAIFENESIN-DM 100-10 MG/5ML PO SYRP: 5.0000 mL | ORAL_SOLUTION | ORAL | Status: DC | PRN

## 2022-07-10 MED ORDER — AMLODIPINE BESYLATE 5 MG PO TABS
5.0000 mg | ORAL_TABLET | Freq: Every day | ORAL | Status: DC
  Administered 2022-07-10 – 2022-07-13 (×4): 5 mg via ORAL
  Filled 2022-07-10 (×4): qty 1

## 2022-07-10 MED ORDER — TIOTROPIUM BROMIDE MONOHYDRATE 18 MCG IN CAPS: 18.0000 ug | ORAL_CAPSULE | Freq: Every day | RESPIRATORY_TRACT | Status: DC

## 2022-07-10 MED ORDER — ALPRAZOLAM 0.5 MG PO TABS: 0.5000 mg | ORAL_TABLET | Freq: Every day | ORAL | Status: DC | PRN

## 2022-07-10 MED ORDER — SODIUM CHLORIDE 0.9 % IV SOLN
INTRAVENOUS | Status: DC | PRN
  Administered 2022-07-13: 10 mL via INTRAVENOUS

## 2022-07-10 MED ORDER — SODIUM CHLORIDE 0.9 % IV SOLN
500.0000 mg | INTRAVENOUS | Status: AC
  Administered 2022-07-10 – 2022-07-14 (×5): 500 mg via INTRAVENOUS
  Filled 2022-07-10 (×5): qty 5

## 2022-07-10 MED ORDER — SODIUM CHLORIDE 0.9% FLUSH
3.0000 mL | Freq: Two times a day (BID) | INTRAVENOUS | Status: DC
  Administered 2022-07-11 – 2022-07-23 (×21): 3 mL via INTRAVENOUS

## 2022-07-10 MED ORDER — ONDANSETRON HCL 4 MG/2ML IJ SOLN
4.0000 mg | Freq: Four times a day (QID) | INTRAMUSCULAR | Status: DC | PRN
  Administered 2022-07-10: 4 mg via INTRAVENOUS
  Filled 2022-07-10: qty 2

## 2022-07-10 MED ORDER — OXYCODONE-ACETAMINOPHEN 5-325 MG PO TABS: ORAL_TABLET | ORAL | 0 refills | Status: DC

## 2022-07-10 MED ORDER — POLYETHYLENE GLYCOL 3350 17 G PO PACK
17.0000 g | PACK | Freq: Every day | ORAL | Status: DC
  Administered 2022-07-10 – 2022-07-19 (×10): 17 g via ORAL
  Filled 2022-07-10 (×10): qty 1

## 2022-07-10 MED ORDER — ALBUTEROL SULFATE (2.5 MG/3ML) 0.083% IN NEBU
2.5000 mg | INHALATION_SOLUTION | RESPIRATORY_TRACT | Status: DC | PRN
  Administered 2022-07-18: 2.5 mg via RESPIRATORY_TRACT
  Filled 2022-07-10: qty 3

## 2022-07-10 MED ORDER — SENNOSIDES-DOCUSATE SODIUM 8.6-50 MG PO TABS
2.0000 | ORAL_TABLET | Freq: Every day | ORAL | Status: DC
  Administered 2022-07-10 – 2022-07-18 (×8): 2 via ORAL
  Filled 2022-07-10 (×9): qty 2

## 2022-07-10 MED ORDER — BISACODYL 10 MG RE SUPP: 10.0000 mg | Freq: Every day | RECTAL | Status: DC | PRN

## 2022-07-10 MED ORDER — SODIUM CHLORIDE 0.9% FLUSH: 3.0000 mL | INTRAVENOUS | Status: DC | PRN

## 2022-07-10 MED ORDER — ACETAMINOPHEN 325 MG PO TABS
650.0000 mg | ORAL_TABLET | Freq: Four times a day (QID) | ORAL | Status: DC | PRN
  Filled 2022-07-10 (×4): qty 2

## 2022-07-10 MED ORDER — INSULIN ASPART 100 UNIT/ML IJ SOLN
0.0000 [IU] | Freq: Every day | INTRAMUSCULAR | Status: DC
  Administered 2022-07-13 – 2022-07-15 (×2): 2 [IU] via SUBCUTANEOUS
  Administered 2022-07-19: 3 [IU] via SUBCUTANEOUS
  Administered 2022-07-21: 2 [IU] via SUBCUTANEOUS
  Administered 2022-07-23: 5 [IU] via SUBCUTANEOUS

## 2022-07-10 MED ORDER — TRAZODONE HCL 50 MG PO TABS
50.0000 mg | ORAL_TABLET | Freq: Every evening | ORAL | Status: DC | PRN
  Administered 2022-07-12: 50 mg via ORAL
  Filled 2022-07-10: qty 1

## 2022-07-10 MED ORDER — METHOCARBAMOL 500 MG PO TABS
500.0000 mg | ORAL_TABLET | Freq: Three times a day (TID) | ORAL | Status: DC
  Administered 2022-07-10 – 2022-07-22 (×36): 500 mg via ORAL
  Filled 2022-07-10 (×36): qty 1

## 2022-07-10 MED ORDER — HYDRALAZINE HCL 20 MG/ML IJ SOLN: 10.0000 mg | Freq: Four times a day (QID) | INTRAMUSCULAR | Status: DC | PRN

## 2022-07-10 MED ORDER — SODIUM CHLORIDE 0.9 % IV SOLN
2.0000 g | Freq: Once | INTRAVENOUS | Status: AC
  Administered 2022-07-10: 2 g via INTRAVENOUS
  Filled 2022-07-10: qty 20

## 2022-07-10 MED ORDER — INSULIN GLARGINE-YFGN 100 UNIT/ML ~~LOC~~ SOLN
20.0000 [IU] | Freq: Every day | SUBCUTANEOUS | Status: DC
  Administered 2022-07-10 – 2022-07-13 (×4): 20 [IU] via SUBCUTANEOUS
  Filled 2022-07-10 (×5): qty 0.2

## 2022-07-10 MED ORDER — ADULT MULTIVITAMIN W/MINERALS CH
1.0000 | ORAL_TABLET | Freq: Every day | ORAL | Status: DC
  Administered 2022-07-10 – 2022-07-23 (×14): 1 via ORAL
  Filled 2022-07-10 (×15): qty 1

## 2022-07-10 MED ORDER — SODIUM CHLORIDE 0.9% FLUSH
3.0000 mL | Freq: Two times a day (BID) | INTRAVENOUS | Status: DC
  Administered 2022-07-11 – 2022-07-23 (×17): 3 mL via INTRAVENOUS

## 2022-07-10 MED ORDER — POTASSIUM CHLORIDE CRYS ER 20 MEQ PO TBCR
40.0000 meq | EXTENDED_RELEASE_TABLET | Freq: Once | ORAL | Status: AC
  Administered 2022-07-10: 40 meq via ORAL
  Filled 2022-07-10: qty 2

## 2022-07-10 MED ORDER — SODIUM CHLORIDE 0.9 % IV SOLN: INTRAVENOUS | Status: AC

## 2022-07-10 MED ORDER — HEPARIN SODIUM (PORCINE) 5000 UNIT/ML IJ SOLN
5000.0000 [IU] | Freq: Three times a day (TID) | INTRAMUSCULAR | Status: DC
  Administered 2022-07-10 – 2022-07-23 (×37): 5000 [IU] via SUBCUTANEOUS
  Filled 2022-07-10 (×36): qty 1

## 2022-07-10 MED ORDER — ATORVASTATIN CALCIUM 40 MG PO TABS
40.0000 mg | ORAL_TABLET | Freq: Every day | ORAL | Status: DC
  Administered 2022-07-10 – 2022-07-13 (×4): 40 mg via ORAL
  Filled 2022-07-10 (×4): qty 1

## 2022-07-10 MED ORDER — OXYCODONE HCL 5 MG PO TABS
5.0000 mg | ORAL_TABLET | ORAL | Status: DC | PRN
  Administered 2022-07-10 – 2022-07-17 (×9): 5 mg via ORAL
  Filled 2022-07-10 (×10): qty 1

## 2022-07-10 MED ORDER — ACETAMINOPHEN 650 MG RE SUPP: 650.0000 mg | Freq: Four times a day (QID) | RECTAL | Status: DC | PRN

## 2022-07-10 MED ORDER — ACETAMINOPHEN 325 MG PO TABS
650.0000 mg | ORAL_TABLET | Freq: Four times a day (QID) | ORAL | Status: DC | PRN
  Administered 2022-07-11 – 2022-07-20 (×3): 650 mg via ORAL

## 2022-07-10 MED ORDER — UMECLIDINIUM BROMIDE 62.5 MCG/ACT IN AEPB: 1.0000 | INHALATION_SPRAY | Freq: Every day | RESPIRATORY_TRACT | Status: DC

## 2022-07-10 MED ORDER — GUAIFENESIN ER 600 MG PO TB12: 600.0000 mg | ORAL_TABLET | Freq: Two times a day (BID) | ORAL | Status: DC | PRN

## 2022-07-10 MED ORDER — METHYLPREDNISOLONE SODIUM SUCC 125 MG IJ SOLR
125.0000 mg | Freq: Once | INTRAMUSCULAR | Status: AC
  Administered 2022-07-10: 125 mg via INTRAVENOUS
  Filled 2022-07-10: qty 2

## 2022-07-10 NOTE — ED Notes (Signed)
Patient transported to CT 

## 2022-07-10 NOTE — ED Provider Notes (Signed)
[facility] EMERGENCY DEPARTMENT Provider Note   CSN: [id number] Arrival date & time: 07/10/22  0741     History {Add pertinent medical, surgical, social history, OB history to HPI:1} Chief Complaint  Patient presents with   Sheena [name] is a 88 y.o. female.  Patient has a history of diabetes and stays at a nursing home.  She had a fall today.   Fall       Home Medications Prior to Admission medications   Medication Sig Start Date End Date Taking? Authorizing Provider  oxyCODONE-acetaminophen (PERCOCET/ROXICET) 5-325 MG tablet Take 1 pain medicine every 6 hours for pain does not help with Motrin alone 07/10/22  Yes Milton Ferguson, MD  albuterol (PROVENTIL HFA;VENTOLIN HFA) 108 (90 Base) MCG/ACT inhaler Inhale 1 puff into the lungs every 6 (six) hours as needed for wheezing or shortness of breath.    [provider]  albuterol (PROVENTIL) (2.5 MG/3ML) 0.083% nebulizer solution Take 3 mLs (2.5 mg total) by nebulization every 6 (six) hours. Patient taking differently: Take 2.5 mg by nebulization 3 (three) times daily as needed for wheezing or shortness of breath. 09/18/18   Kathie Dike, MD  ALPRAZolam [name]) 0.5 MG tablet Take 1 tablet (0.5 mg total) by mouth daily as needed for anxiety. 05/13/18   Johnson, Clanford L, MD  amLODipine (NORVASC) 5 MG tablet Take 1 tablet (5 mg total) by mouth daily. 09/19/18   Kathie Dike, MD  atorvastatin (LIPITOR) 40 MG tablet TAKE ONE TABLET BY MOUTH EVERY DAY. Patient taking differently: Take 40 mg by mouth every evening. 06/12/14   [name], MD  guaiFENesin (MUCINEX) 600 MG 12 hr tablet Take 600 mg by mouth 2 (two) times daily as needed for cough or to loosen phlegm.    [provider]  Insulin Glargine (LANTUS SOLOSTAR) 100 UNIT/ML Solostar Pen Inject 25 Units into the skin 2 (two) times daily. Patient taking differently: Inject 15 Units into the skin 2 (two) times daily. 05/09/18   Kathie Dike, MD   levofloxacin (LEVAQUIN) 750 MG tablet Take 1 tablet (750 mg total) by mouth daily. Patient not taking: Reported on 10/20/2020 07/26/19   Noemi Chapel, MD  loratadine (CLARITIN) 10 MG tablet Take 1 tablet (10 mg total) by mouth daily. 05/09/18 07/29/19  Kathie Dike, MD  neomycin-bacitracin-polymyxin (NEOSPORIN) [phone] ointment Apply 1 application topically in the morning and at bedtime. Apply to affected area(s) arms    [provider]  ondansetron (ZOFRAN ODT) 4 MG disintegrating tablet Take 1 tablet (4 mg total) by mouth every 8 (eight) hours as needed for nausea. 06/03/20   Noemi Chapel, MD  QUEtiapine (SEROQUEL) 25 MG tablet Take 25 mg by mouth 2 (two) times daily.    [provider]  sertraline (ZOLOFT) 50 MG tablet Take 50 mg by mouth daily.    [provider]  tiotropium (SPIRIVA HANDIHALER) 18 MCG inhalation capsule Place 1 capsule (18 mcg total) into inhaler and inhale daily. 05/09/18 07/29/19  Kathie Dike, MD  triamcinolone (KENALOG) 0.1 % Apply 1 application topically 2 (two) times daily as needed (rash/itching).    [provider]  Calcium Carbonate (CALCIUM 500 PO) Take by mouth.  04/30/16  [provider]      Allergies    Patient has no known allergies.    Review of Systems   Review of Systems  Physical Exam Updated Vital Signs BP 117/68   Pulse 70   Temp (!) 97.5 F (36.4  C)   Resp 17   Ht '5\' 6"'$  (1.676 m)   Wt 81.6 kg   SpO2 97%   BMI 29.05 kg/m  Physical Exam  ED Results / Procedures / Treatments   Labs (all labs ordered are listed, but only abnormal results are displayed) Labs Reviewed  CBC WITH DIFFERENTIAL/PLATELET - Abnormal; Notable for the following components:      Result Value   WBC 20.4 (*)    RBC 3.29 (*)    Hemoglobin 9.8 (*)    HCT 29.9 (*)    Neutro Abs 18.4 (*)    Monocytes Absolute 1.1 (*)    Abs Immature Granulocytes 0.23 (*)    All other components within normal limits  BASIC  METABOLIC PANEL - Abnormal; Notable for the following components:   Glucose, Bld 103 (*)    Creatinine, Ser 1.19 (*)    Calcium 8.3 (*)    GFR, Estimated 42 (*)    All other components within normal limits    EKG None  Radiology CT Cervical Spine Wo Contrast  Result Date: 07/10/2022 CLINICAL DATA:  Neck trauma (Age >= 65y) EXAM: CT CERVICAL SPINE WITHOUT CONTRAST TECHNIQUE: Multidetector CT imaging of the cervical spine was performed without intravenous contrast. Multiplanar CT image reconstructions were also generated. RADIATION DOSE REDUCTION: This exam was performed according to the departmental dose-optimization program which includes automated exposure control, adjustment of the mA and/or kV according to patient size and/or use of iterative reconstruction technique. COMPARISON:  10/20/2020. FINDINGS: Alignment: Reversed cervical lordosis noted consistent with sequela of degenerative changes versus muscle spasm or positioning. No suggestion of an acute traumatic etiology for this finding. Skull base and vertebrae: No acute fracture. No primary bone lesion or focal pathologic process. Soft tissues and spinal canal: No prevertebral fluid or swelling. No visible canal hematoma. Disc levels: Multilevel disc disease identified with loss of disc spaces and marginal osteophytes extending from C4 through C7. Upper chest: Negative. IMPRESSION: Degenerative changes. There are no acute traumatic abnormalities identified. Electronically Signed   By: Sammie Bench M.D.   On: 07/10/2022 09:30   CT Head Wo Contrast  Result Date: 07/10/2022 CLINICAL DATA:  Head trauma, intracranial arterial injury suspected EXAM: CT HEAD WITHOUT CONTRAST TECHNIQUE: Contiguous axial images were obtained from the base of the skull through the vertex without intravenous contrast. RADIATION DOSE REDUCTION: This exam was performed according to the departmental dose-optimization program which includes automated exposure control,  adjustment of the mA and/or kV according to patient size and/or use of iterative reconstruction technique. COMPARISON:  10/20/2020 FINDINGS: Brain: There is periventricular white matter decreased attenuation consistent with small vessel ischemic changes. Ventricles, sulci and cisterns are prominent consistent with age related involutional changes. No acute intracranial hemorrhage, mass effect or shift. No hydrocephalus. Vascular: No hyperdense vessel or unexpected calcification. Skull: Normal. Negative for fracture or focal lesion. Sinuses/Orbits: Mild chronic left maxillary sinusitis. IMPRESSION: Atrophy and chronic small vessel ischemic changes. No acute intracranial process identified. Electronically Signed   By: Sammie Bench M.D.   On: 07/10/2022 09:27   DG Hip Unilat W or Wo Pelvis 2-3 Views Left  Result Date: 07/10/2022 CLINICAL DATA:  Fall EXAM: DG HIP (WITH OR WITHOUT PELVIS) 3V LEFT COMPARISON:  10/20/2020. FINDINGS: Osseous structures are osteopenic. Left total hip arthroplasty. Prosthesis appears intact without Periprosthetic lucency. There are acute fractures of the inferior and superior pubic rami on the left displaced by a few mm. The pelvic ring appears otherwise intact. CT may be  helpful to assess for additional radiographically occult fractures. IMPRESSION: Acute left pubic rami fractures. Status post left total hip arthroplasty prosthesis appears intact. Electronically Signed   By: Sammie Bench M.D.   On: 07/10/2022 09:22   DG Chest Port 1 View  Result Date: 07/10/2022 CLINICAL DATA:  Fall EXAM: PORTABLE CHEST 1 VIEW COMPARISON:  01/06/2022 FINDINGS: The cardiac silhouette appears prominent. There is pulmonary vascular congestion. There is minimal left basilar consolidation or volume loss. There may be small left-sided pleural effusion. No pneumothorax identified. Aorta is calcified. There are thoracic degenerative changes. IMPRESSION: Minimal left basilar consolidation and possible  effusion. Pulmonary vascular congestion. Electronically Signed   By: Sammie Bench M.D.   On: 07/10/2022 09:19    Procedures Procedures  {Document cardiac monitor, telemetry assessment procedure when appropriate:1}  Medications Ordered in ED Medications - No data to display  ED Course/ Medical Decision Making/ A&P                           Medical Decision Making Amount and/or Complexity of Data Reviewed Labs: ordered. Radiology: ordered. ECG/medicine tests: ordered.  Risk Prescription drug management. Decision regarding hospitalization.   Patient with pelvic fracture.  She also had a urinary tract infection and interstitial pneumonia along with nondisplaced rib fractures.  She will be admitted to medicine  {Document critical care time when appropriate:1} {Document review of labs and clinical decision tools ie heart score, Chads2Vasc2 etc:1}  {Document your independent review of radiology images, and any outside records:1} {Document your discussion with family members, caretakers, and with consultants:1} {Document social determinants of health affecting pt's care:1} {Document your decision making why or why not admission, treatments were needed:1} Final Clinical Impression(s) / ED Diagnoses Final diagnoses:  Fall, initial encounter    Rx / DC Orders ED Discharge Orders          Ordered    oxyCODONE-acetaminophen (PERCOCET/ROXICET) 5-325 MG tablet        07/10/22 1121

## 2022-07-10 NOTE — ED Notes (Signed)
DNR band placed on pt wrist

## 2022-07-10 NOTE — ED Notes (Signed)
Pt states that it hurts to take a deep breath, however, when she does, SpO2 goes up. SpO2 at 94% on 5L [place]

## 2022-07-10 NOTE — ED Notes (Signed)
CBG 97 

## 2022-07-10 NOTE — ED Notes (Signed)
L elbow bandaged where skin tear was located

## 2022-07-10 NOTE — Discharge Instructions (Addendum)
Use a walker to ambulate with.  Initially it may be too painful to walk.  Follow-up with your family doctor or Dr. Aline Brochure orthopedic doctor.  You will need physical therapy arranged at your facility

## 2022-07-10 NOTE — Progress Notes (Signed)
Patient arrived on unit from ED via stretcher. Patient alert, without distress. Settled into room. Will continue to monitor according to orders and Plan of Care.

## 2022-07-10 NOTE — ED Triage Notes (Signed)
Pt brought in by [facility] from [place] for c/o fall and pain to left hip  EMS reports pt was going to bathroom and tripped and fell landing on her left hip, pt has outward rotation to hip  Pt has skin tears to left elbow  Pt denies any loc

## 2022-07-10 NOTE — H&P (Signed)
Patient Demographics:    Sheena [name], is a 88 y.o. female  MRN: [id number]   DOB - [birth year]  Admit Date - 07/10/2022  Outpatient Primary MD for the patient is [name], [name], MD   Assessment & Plan:   Assessment and Plan:  1)UTI -WBC over 20,000 -Generalized weakness and falls -Does not meet sepsis criteria -Continue IV Rocephin pending urine and blood cultures  2)Acute left pubic rami fractures----after mechanical fall -Get orthopedic consult -Get PT and OT consult -Pain control as ordered  3)acute hypoxic respiratory failure--- related to opiate use for pain control in the setting of underlying COPD -Try to wean off oxygen as able  4)Social/Ethics-documentation reflects DNR status, patient is a poor historian overall Code status verified with daughter [name] [phone]--- -Attempted to reach patient's daughters [name] [phone] unable to reach her, left voicemails  -I spoke with daughter [name] [phone]--- HCPOA--is [name] [phone]  5)DM2-continue Lantus insulin Use Novolog/Humalog Sliding scale insulin with Accu-Cheks/Fingersticks as ordered   6)HTN-continue amlodipine, - May use IV hydralazine as needed elevated BP   7)Dementia and depression--- continue trazodone as needed, continue Zoloft 75 mg daily and Seroquel 12.5 mg  8)COPD/rib fractures-- -CT chest with bronchiectasis, pulm Neri fibrosis and possible interstitial pneumonia, , Left-sided second third and fourth rib fractures  no acute exacerbation, continue bronchodilators -Incentive spirometry as ordered  9) Recurrent Falls--- at baseline patient ambulates with a walker -Had a mechanical fall- No head injury no nausea no vomiting no chest pains and palpitations no syncopal  type symptoms -CT head and CT C-spine without acute findings PTA pt lived at [facility] and did very poorly, patient has significant limitations with mobility related ADLs- this patient needs to continue to be monitored in the hospital until a SNF bed is obtained as she is not safe to go home with her current physcical limitations -Physical therapy and Occupational Therapy eval requested  10) chronic anemia-Hgb 9.8 which is close to prior baseline, no bleeding concerns at this time monitor closely   Status is: Inpatient  Remains inpatient appropriate because:   Dispo: The patient is from: ALF              Anticipated d/c is to: SNF              Anticipated d/c date is: 2 days              Patient currently is not medically stable to d/c. Barriers: Not Clinically Stable-   With History of - Reviewed by me  Past Medical History:  Diagnosis Date   Asthma    treated by Dr. Donneta Romberg   Depression    Diabetes mellitus, type 2 ([place])    H/O: hysterectomy 1973   fibroids   Hyperlipidemia    Hypertension    Left arm pain    Lumbago    Melanoma ([place])    left arm, resected,  annual CXR's ordered   Syncope 08/2013   Suspected vagal-mediated      Past Surgical History:  Procedure Laterality Date   ABDOMINAL HYSTERECTOMY  1973   fibroid uterus   BREAST BIOPSY  1985   normal   JOINT REPLACEMENT   2003   Left Hip   TOTAL HIP ARTHROPLASTY  2003   left, Dr. Durward Fortes   TRANSTHORACIC ECHOCARDIOGRAM  08/23/13   EF 60-65%, borderline concentric LVH, impaired LV diastolic relaxation, LA ULN    Chief Complaint  Patient presents with   Fall      HPI:    Sheena [name]  is a 88 y.o. female with pmhx relevant for depression/demention, HTN , DM2.Marland Kitchen  COPD [facility] with Lt hip pain after falling -At baseline patient ambulates with a walker, she apparently was going to the bathroom and tripped and fell landing on the left hip also has a skin tear to  her left elbow -Apparently no loss of consciousness -No head injury no nausea no vomiting no chest pains and palpitations no syncopal type symptoms - patient is a poor historian overall, so additional history and Code status verified with daughter [name] [phone]--- -Attempted to reach patient's daughters [name] [phone] unable to reach her, left voicemails  -I spoke with daughter [name] [phone]--- -hip x-rays show left-sided pubic rami fractures -CT head and CT C-spine without acute findings -CT chest with bronchiectasis, pulm Neri fibrosis and possible interstitial pneumonia, , Left-sided second third and fourth rib fractures  -BNP 58 -UA suggestive of UTI -WBC 20.4 hemoglobin 9.8 which is close to prior baseline platelets 202 -Creatinine 1.1 -  Review of systems:    In addition to the HPI above,   A full Review of  Systems was done, all other systems reviewed are negative except as noted above in HPI , .    Social History:  Reviewed by me    Social History   Tobacco Use   Smoking status: Never   Smokeless tobacco: Never  Substance Use Topics   Alcohol use: No    Comment: occasional       Family History :  Reviewed by me    Family History  Problem Relation Age of Onset   Diabetes Mother    Heart disease Mother        CHF   Heart disease Father    Diabetes Father      Home Medications:   Prior to Admission medications   Medication Sig Start Date End Date Taking? Authorizing Provider  albuterol (PROVENTIL HFA;VENTOLIN HFA) 108 (90 Base) MCG/ACT inhaler Inhale 1 puff into the lungs every 6 (six) hours as needed for wheezing or shortness of breath.   Yes [provider]  albuterol (PROVENTIL) (2.5 MG/3ML) 0.083% nebulizer solution Take 3 mLs (2.5 mg total) by nebulization every 6 (six) hours. Patient taking differently: Take 2.5 mg by nebulization every 6 (six) hours as needed for wheezing or shortness of breath.  09/18/18  Yes Kathie Dike, MD  ALPRAZolam [name]) 0.5 MG tablet Take 1 tablet (0.5 mg total) by mouth daily as needed for anxiety. 05/13/18  Yes Johnson, Clanford L, MD  amLODipine (NORVASC) 5 MG tablet Take 1 tablet (5 mg total) by mouth daily. 09/19/18  Yes Kathie Dike, MD  atorvastatin (LIPITOR) 40 MG tablet TAKE ONE TABLET BY MOUTH EVERY DAY. Patient taking differently: Take 40 mg by mouth every evening. 06/12/14  Yes Deborra Medina  L, MD  dextromethorphan-guaiFENesin (GERI-TUSSIN DM) 10-100 MG/5ML liquid Take 5 mLs by mouth every 4 (four) hours as needed for cough.   Yes [provider]  diphenhydrAMINE (ALLERGY RELIEF) 25 MG tablet Take 25 mg by mouth every 8 (eight) hours as needed for allergies or itching.   Yes [provider]  furosemide (LASIX) 20 MG tablet Take 20 mg by mouth in the morning.   Yes [provider]  guaiFENesin (MUCINEX) 600 MG 12 hr tablet Take 600 mg by mouth 2 (two) times daily as needed for cough or to loosen phlegm.   Yes [provider]  Insulin Glargine (LANTUS SOLOSTAR) 100 UNIT/ML Solostar Pen Inject 25 Units into the skin 2 (two) times daily. Patient taking differently: Inject 15 Units into the skin 2 (two) times daily. 05/09/18  Yes Kathie Dike, MD  loratadine (CLARITIN) 10 MG tablet Take 1 tablet (10 mg total) by mouth daily. 05/09/18 07/10/22 Yes Kathie Dike, MD  Multiple Vitamins-Minerals (PRESERVISION AREDS 2 PO) Take 1 capsule by mouth 2 (two) times daily.   Yes [provider]  oxyCODONE-acetaminophen (PERCOCET/ROXICET) 5-325 MG tablet Take 1 pain medicine every 6 hours for pain does not help with Motrin alone 07/10/22  Yes Milton Ferguson, MD  OXYGEN Inhale 3 L/min into the lungs continuous.   Yes [provider]  QUEtiapine (SEROQUEL) 25 MG tablet Take 12.5 mg by mouth at bedtime.   Yes [provider]  sertraline (ZOLOFT) 50 MG tablet Take 75 mg by mouth daily.   Yes [provider]  tiotropium (SPIRIVA HANDIHALER) 18 MCG inhalation capsule Place 1 capsule (18 mcg total) into inhaler and inhale daily. 05/09/18 07/10/22 Yes Kathie Dike, MD  triamcinolone (KENALOG) 0.1 % Apply 1 application topically 2 (two) times daily as needed (rash/itching).   Yes [provider]  levofloxacin (LEVAQUIN) 750 MG tablet Take 1 tablet (750 mg total) by mouth daily. Patient not taking: Reported on 10/20/2020 07/26/19   Noemi Chapel, MD  ondansetron (ZOFRAN ODT) 4 MG disintegrating tablet Take 1 tablet (4 mg total) by mouth every 8 (eight) hours as needed for nausea. Patient not taking: Reported on 07/10/2022 06/03/20   Noemi Chapel, MD  Calcium Carbonate (CALCIUM 500 PO) Take by mouth.  04/30/16  [provider]     Allergies:    No Known Allergies   Physical Exam:   Vitals  Blood pressure 134/63, pulse 81, temperature 98.7 F (37.1 C), temperature source Oral, resp. rate (!) 23, height '5\' 6"'$  (1.676 m), weight 81.6 kg, SpO2 96 %.  Physical Examination: General appearance - alert,  in no distress  Mental status - alert, oriented to person,, cognitive and memory deficits noted Eyes - sclera anicteric Neck - supple, no JVD elevation , Chest - clear  to auscultation bilaterally, symmetrical air movement,  Heart - S1 and S2 normal, regular  Abdomen - soft, nontender, nondistended, +BS Neurological - screening mental status exam normal, neck supple without rigidity, cranial nerves II through XII intact, DTR's normal and symmetric Extremities - no pedal edema noted, intact peripheral pulses , Skin - warm, dry, left elbow skin tear MSK-  pain with range of motion of left lower extremity and hips     Data Review:    CBC Recent Labs  Lab 07/10/22 1000  WBC 20.4*  HGB 9.8*  HCT 29.9*  PLT 202  MCV 90.9  MCH 29.8  MCHC 32.8  RDW 15.4  LYMPHSABS 0.7  MONOABS 1.1*  EOSABS  0.0  BASOSABS 0.0    ------------------------------------------------------------------------------------------------------------------  Chemistries  Recent Labs  Lab 07/10/22 1000  NA 140  K 3.5  CL 101  CO2 27  GLUCOSE 103*  BUN 17  CREATININE 1.19*  CALCIUM 8.3*   ------------------------------------------------------------------------------------------------------------------ estimated creatinine clearance is 29.8 mL/min (A) (by C-G formula based on SCr of 1.19 mg/dL (H)). ------------------------------------------------------------------------------------------------------------------ ------------------------------------------------------------------------------------------------------------------    Component Value Date/Time   BNP 58.0 07/10/2022 1000   Urinalysis    Component Value Date/Time   COLORURINE AMBER (A) 07/10/2022 1137   APPEARANCEUR HAZY (A) 07/10/2022 1137   APPEARANCEUR Clear 03/05/2014 2034   LABSPEC 1.016 07/10/2022 1137   LABSPEC 1.010 03/05/2014 2034   PHURINE 5.0 07/10/2022 1137   GLUCOSEU NEGATIVE 07/10/2022 1137   GLUCOSEU Negative 03/05/2014 2034   HGBUR NEGATIVE 07/10/2022 1137   BILIRUBINUR NEGATIVE 07/10/2022 1137   BILIRUBINUR Negative 03/05/2014 2034   KETONESUR 5 (A) 07/10/2022 1137   PROTEINUR 100 (A) 07/10/2022 1137   UROBILINOGEN 0.2 06/13/2013 1148   NITRITE POSITIVE (A) 07/10/2022 1137   LEUKOCYTESUR LARGE (A) 07/10/2022 1137   LEUKOCYTESUR 1+ 03/05/2014 2034     Imaging Results:    CT Chest Wo Contrast  Result Date: 07/10/2022 CLINICAL DATA:  Pleural effusion, evaluate for malignancy EXAM: CT CHEST WITHOUT CONTRAST TECHNIQUE: Multidetector CT imaging of the chest was performed following the standard protocol without IV contrast. RADIATION DOSE REDUCTION: This exam was performed according to the departmental dose-optimization program which includes automated exposure control, adjustment of the mA and/or kV according to patient size and/or use of  iterative reconstruction technique. COMPARISON:  Previous chest radiographs including the examination done on 07/10/2022 and CT chest done on 05/01/2010 FINDINGS: Cardiovascular: Scattered calcifications are seen in thoracic aorta and its major branches. There are small scattered coronary artery calcifications are noted. There is dense calcification in the region of mitral annulus. There is ectasia of main pulmonary artery measuring 3.8 cm suggesting pulmonary arterial hypertension. There is ectasia of ascending thoracic aorta measuring 3.8 cm. Mediastinum/Nodes: There is slight interval increase in size of lymph nodes in mediastinum measuring up to 10 mm in short axis. In the previous CT, lymph nodes were seen in the mediastinum measuring up to 8.6 mm in short axis measurement. There is 1.4 cm smooth marginated fluid density nodule in the posterior right lobe of thyroid. There is increase in size since the previous study. No follow-up is recommended. Lungs/Pleura: There is ectasia of bronchi in both lungs, more so in the lower lung fields with interval worsening. Increased interstitial markings are seen in the periphery of both lungs, more so in the lower lung fields with interval progression of pulmonary fibrosis. There are scattered faint ground-glass densities in mid and lower lung fields. There is no focal consolidation. There is no pleural effusion or pneumothorax. Upper Abdomen: Small hiatal hernia is seen. Scattered calcifications are seen in abdominal aorta. There is 11 mm exophytic low-density lesion in the posterolateral aspect of upper pole of left kidney suggesting possible renal cyst. No follow-up imaging is recommended. Musculoskeletal: Recent undisplaced fractures are seen in the posterior aspects of left second and third ribs. Deformities are noted in the anterior/anterolateral aspect of left second, third and fourth ribs, possibly recent fractures. There is encroachment of neural foramina by bony  spurs in the visualized lower cervical spine. Small bony spurs are seen in both shoulders. IMPRESSION: Bronchiectasis. Increased interstitial markings are seen in the periphery of both lungs with interval progression suggesting worsening of pulmonary  fibrosis. There are faint ground-glass densities in the mid and lower lung fields suggesting interstitial lung disease or interstitial pneumonia. There is no focal pulmonary consolidation. There is no pleural effusion. There is slight increase in size of lymph nodes in mediastinum, possibly suggesting reactive hyperplasia. There is ectasia of main pulmonary artery suggesting pulmonary arterial hypertension. Scattered coronary artery calcifications are seen. Aortic atherosclerosis. Recent fractures are seen in left second, third and fourth ribs. There is no pneumothorax. Small hiatal hernia. Other findings as described in the body of the report. Electronically Signed   By: Elmer Picker M.D.   On: 07/10/2022 12:29   CT Cervical Spine Wo Contrast  Result Date: 07/10/2022 CLINICAL DATA:  Neck trauma (Age >= 65y) EXAM: CT CERVICAL SPINE WITHOUT CONTRAST TECHNIQUE: Multidetector CT imaging of the cervical spine was performed without intravenous contrast. Multiplanar CT image reconstructions were also generated. RADIATION DOSE REDUCTION: This exam was performed according to the departmental dose-optimization program which includes automated exposure control, adjustment of the mA and/or kV according to patient size and/or use of iterative reconstruction technique. COMPARISON:  10/20/2020. FINDINGS: Alignment: Reversed cervical lordosis noted consistent with sequela of degenerative changes versus muscle spasm or positioning. No suggestion of an acute traumatic etiology for this finding. Skull base and vertebrae: No acute fracture. No primary bone lesion or focal pathologic process. Soft tissues and spinal canal: No prevertebral fluid or swelling. No visible canal  hematoma. Disc levels: Multilevel disc disease identified with loss of disc spaces and marginal osteophytes extending from C4 through C7. Upper chest: Negative. IMPRESSION: Degenerative changes. There are no acute traumatic abnormalities identified. Electronically Signed   By: Sammie Bench M.D.   On: 07/10/2022 09:30   CT Head Wo Contrast  Result Date: 07/10/2022 CLINICAL DATA:  Head trauma, intracranial arterial injury suspected EXAM: CT HEAD WITHOUT CONTRAST TECHNIQUE: Contiguous axial images were obtained from the base of the skull through the vertex without intravenous contrast. RADIATION DOSE REDUCTION: This exam was performed according to the departmental dose-optimization program which includes automated exposure control, adjustment of the mA and/or kV according to patient size and/or use of iterative reconstruction technique. COMPARISON:  10/20/2020 FINDINGS: Brain: There is periventricular white matter decreased attenuation consistent with small vessel ischemic changes. Ventricles, sulci and cisterns are prominent consistent with age related involutional changes. No acute intracranial hemorrhage, mass effect or shift. No hydrocephalus. Vascular: No hyperdense vessel or unexpected calcification. Skull: Normal. Negative for fracture or focal lesion. Sinuses/Orbits: Mild chronic left maxillary sinusitis. IMPRESSION: Atrophy and chronic small vessel ischemic changes. No acute intracranial process identified. Electronically Signed   By: Sammie Bench M.D.   On: 07/10/2022 09:27   DG Hip Unilat W or Wo Pelvis 2-3 Views Left  Result Date: 07/10/2022 CLINICAL DATA:  Fall EXAM: DG HIP (WITH OR WITHOUT PELVIS) 3V LEFT COMPARISON:  10/20/2020. FINDINGS: Osseous structures are osteopenic. Left total hip arthroplasty. Prosthesis appears intact without Periprosthetic lucency. There are acute fractures of the inferior and superior pubic rami on the left displaced by a few mm. The pelvic ring appears  otherwise intact. CT may be helpful to assess for additional radiographically occult fractures. IMPRESSION: Acute left pubic rami fractures. Status post left total hip arthroplasty prosthesis appears intact. Electronically Signed   By: Sammie Bench M.D.   On: 07/10/2022 09:22   DG Chest Port 1 View  Result Date: 07/10/2022 CLINICAL DATA:  Fall EXAM: PORTABLE CHEST 1 VIEW COMPARISON:  01/06/2022 FINDINGS: The cardiac silhouette appears prominent. There is  pulmonary vascular congestion. There is minimal left basilar consolidation or volume loss. There may be small left-sided pleural effusion. No pneumothorax identified. Aorta is calcified. There are thoracic degenerative changes. IMPRESSION: Minimal left basilar consolidation and possible effusion. Pulmonary vascular congestion. Electronically Signed   By: Sammie Bench M.D.   On: 07/10/2022 09:19    Radiological Exams on Admission: CT Chest Wo Contrast  Result Date: 07/10/2022 CLINICAL DATA:  Pleural effusion, evaluate for malignancy EXAM: CT CHEST WITHOUT CONTRAST TECHNIQUE: Multidetector CT imaging of the chest was performed following the standard protocol without IV contrast. RADIATION DOSE REDUCTION: This exam was performed according to the departmental dose-optimization program which includes automated exposure control, adjustment of the mA and/or kV according to patient size and/or use of iterative reconstruction technique. COMPARISON:  Previous chest radiographs including the examination done on 07/10/2022 and CT chest done on 05/01/2010 FINDINGS: Cardiovascular: Scattered calcifications are seen in thoracic aorta and its major branches. There are small scattered coronary artery calcifications are noted. There is dense calcification in the region of mitral annulus. There is ectasia of main pulmonary artery measuring 3.8 cm suggesting pulmonary arterial hypertension. There is ectasia of ascending thoracic aorta measuring 3.8 cm.  Mediastinum/Nodes: There is slight interval increase in size of lymph nodes in mediastinum measuring up to 10 mm in short axis. In the previous CT, lymph nodes were seen in the mediastinum measuring up to 8.6 mm in short axis measurement. There is 1.4 cm smooth marginated fluid density nodule in the posterior right lobe of thyroid. There is increase in size since the previous study. No follow-up is recommended. Lungs/Pleura: There is ectasia of bronchi in both lungs, more so in the lower lung fields with interval worsening. Increased interstitial markings are seen in the periphery of both lungs, more so in the lower lung fields with interval progression of pulmonary fibrosis. There are scattered faint ground-glass densities in mid and lower lung fields. There is no focal consolidation. There is no pleural effusion or pneumothorax. Upper Abdomen: Small hiatal hernia is seen. Scattered calcifications are seen in abdominal aorta. There is 11 mm exophytic low-density lesion in the posterolateral aspect of upper pole of left kidney suggesting possible renal cyst. No follow-up imaging is recommended. Musculoskeletal: Recent undisplaced fractures are seen in the posterior aspects of left second and third ribs. Deformities are noted in the anterior/anterolateral aspect of left second, third and fourth ribs, possibly recent fractures. There is encroachment of neural foramina by bony spurs in the visualized lower cervical spine. Small bony spurs are seen in both shoulders. IMPRESSION: Bronchiectasis. Increased interstitial markings are seen in the periphery of both lungs with interval progression suggesting worsening of pulmonary fibrosis. There are faint ground-glass densities in the mid and lower lung fields suggesting interstitial lung disease or interstitial pneumonia. There is no focal pulmonary consolidation. There is no pleural effusion. There is slight increase in size of lymph nodes in mediastinum, possibly  suggesting reactive hyperplasia. There is ectasia of main pulmonary artery suggesting pulmonary arterial hypertension. Scattered coronary artery calcifications are seen. Aortic atherosclerosis. Recent fractures are seen in left second, third and fourth ribs. There is no pneumothorax. Small hiatal hernia. Other findings as described in the body of the report. Electronically Signed   By: Elmer Picker M.D.   On: 07/10/2022 12:29   CT Cervical Spine Wo Contrast  Result Date: 07/10/2022 CLINICAL DATA:  Neck trauma (Age >= 65y) EXAM: CT CERVICAL SPINE WITHOUT CONTRAST TECHNIQUE: Multidetector CT imaging of the cervical  spine was performed without intravenous contrast. Multiplanar CT image reconstructions were also generated. RADIATION DOSE REDUCTION: This exam was performed according to the departmental dose-optimization program which includes automated exposure control, adjustment of the mA and/or kV according to patient size and/or use of iterative reconstruction technique. COMPARISON:  10/20/2020. FINDINGS: Alignment: Reversed cervical lordosis noted consistent with sequela of degenerative changes versus muscle spasm or positioning. No suggestion of an acute traumatic etiology for this finding. Skull base and vertebrae: No acute fracture. No primary bone lesion or focal pathologic process. Soft tissues and spinal canal: No prevertebral fluid or swelling. No visible canal hematoma. Disc levels: Multilevel disc disease identified with loss of disc spaces and marginal osteophytes extending from C4 through C7. Upper chest: Negative. IMPRESSION: Degenerative changes. There are no acute traumatic abnormalities identified. Electronically Signed   By: Sammie Bench M.D.   On: 07/10/2022 09:30   CT Head Wo Contrast  Result Date: 07/10/2022 CLINICAL DATA:  Head trauma, intracranial arterial injury suspected EXAM: CT HEAD WITHOUT CONTRAST TECHNIQUE: Contiguous axial images were obtained from the base of the  skull through the vertex without intravenous contrast. RADIATION DOSE REDUCTION: This exam was performed according to the departmental dose-optimization program which includes automated exposure control, adjustment of the mA and/or kV according to patient size and/or use of iterative reconstruction technique. COMPARISON:  10/20/2020 FINDINGS: Brain: There is periventricular white matter decreased attenuation consistent with small vessel ischemic changes. Ventricles, sulci and cisterns are prominent consistent with age related involutional changes. No acute intracranial hemorrhage, mass effect or shift. No hydrocephalus. Vascular: No hyperdense vessel or unexpected calcification. Skull: Normal. Negative for fracture or focal lesion. Sinuses/Orbits: Mild chronic left maxillary sinusitis. IMPRESSION: Atrophy and chronic small vessel ischemic changes. No acute intracranial process identified. Electronically Signed   By: Sammie Bench M.D.   On: 07/10/2022 09:27   DG Hip Unilat W or Wo Pelvis 2-3 Views Left  Result Date: 07/10/2022 CLINICAL DATA:  Fall EXAM: DG HIP (WITH OR WITHOUT PELVIS) 3V LEFT COMPARISON:  10/20/2020. FINDINGS: Osseous structures are osteopenic. Left total hip arthroplasty. Prosthesis appears intact without Periprosthetic lucency. There are acute fractures of the inferior and superior pubic rami on the left displaced by a few mm. The pelvic ring appears otherwise intact. CT may be helpful to assess for additional radiographically occult fractures. IMPRESSION: Acute left pubic rami fractures. Status post left total hip arthroplasty prosthesis appears intact. Electronically Signed   By: Sammie Bench M.D.   On: 07/10/2022 09:22   DG Chest Port 1 View  Result Date: 07/10/2022 CLINICAL DATA:  Fall EXAM: PORTABLE CHEST 1 VIEW COMPARISON:  01/06/2022 FINDINGS: The cardiac silhouette appears prominent. There is pulmonary vascular congestion. There is minimal left basilar consolidation or  volume loss. There may be small left-sided pleural effusion. No pneumothorax identified. Aorta is calcified. There are thoracic degenerative changes. IMPRESSION: Minimal left basilar consolidation and possible effusion. Pulmonary vascular congestion. Electronically Signed   By: Sammie Bench M.D.   On: 07/10/2022 09:19    DVT Prophylaxis -SCD /heparin AM Labs Ordered, also please review Full Orders  Family Communication: Admission, patients condition and plan of care including tests being ordered have been discussed with the patient and daughter who indicate understanding and agree with the plan   Condition  -stable  Roxan Hockey M.D on 07/10/2022 at 4:11 PM Go to [email or web address] -  for contact info  [facility] - Office  [phone]

## 2022-07-10 NOTE — ED Notes (Signed)
Pt's Spo2 dropped to 86-88% on 3 L [place]--Respiratory called and MD made aware

## 2022-07-10 NOTE — ED Notes (Signed)
Pt given dinner tray. Pt states "I am not hungry right now" , encouraged pt to eat

## 2022-07-11 ENCOUNTER — Encounter (HOSPITAL_COMMUNITY): Payer: Self-pay | Admitting: Family Medicine

## 2022-07-11 DIAGNOSIS — S32592A Other specified fracture of left pubis, initial encounter for closed fracture: Secondary | ICD-10-CM | POA: Diagnosis not present

## 2022-07-11 LAB — GLUCOSE, CAPILLARY
Glucose-Capillary: 171 mg/dL — ABNORMAL HIGH (ref 70–99)
Glucose-Capillary: 215 mg/dL — ABNORMAL HIGH (ref 70–99)
Glucose-Capillary: 140 mg/dL — ABNORMAL HIGH (ref 70–99)
Glucose-Capillary: 114 mg/dL — ABNORMAL HIGH (ref 70–99)

## 2022-07-11 MED ORDER — DM-GUAIFENESIN ER 30-600 MG PO TB12
1.0000 | ORAL_TABLET | Freq: Two times a day (BID) | ORAL | Status: DC
  Administered 2022-07-11 – 2022-07-23 (×22): 1 via ORAL
  Filled 2022-07-11 (×22): qty 1

## 2022-07-11 MED ORDER — METHYLPREDNISOLONE SODIUM SUCC 40 MG IJ SOLR
40.0000 mg | INTRAMUSCULAR | Status: DC
  Administered 2022-07-11 – 2022-07-22 (×12): 40 mg via INTRAVENOUS
  Filled 2022-07-11 (×12): qty 1

## 2022-07-11 MED ORDER — ORAL CARE MOUTH RINSE: 15.0000 mL | OROMUCOSAL | Status: DC | PRN

## 2022-07-11 NOTE — TOC Initial Note (Signed)
Transition of Care [facility]) - Initial/Assessment Note    Patient Details  Name: Sheena [name] MRN: [id number] Date of Birth: 1924/01/12  [facility] CM/SW Contact:    Iona Beard, [place] Phone Number: 07/11/2022, 1:40 PM  Clinical Narrative:                 Pt is a resident at [facility]. CSW updated that PT is recommending SNF at D/C. CSW spoke with pts grandson who is POA about PT recommendations. He is agreeable to SNF referral being sent out to local facilities. CSW completed Fl2 and sent to local facilities. TOC to follow for bed offers.   Expected Discharge Plan: Skilled Nursing Facility Barriers to Discharge: Continued Medical Work up   Patient Goals and CMS Choice Patient states their goals for this hospitalization and ongoing recovery are:: SNF CMS Medicare.gov Compare Post Acute Care list provided to:: Patient Represenative (must comment) Choice offered to / list presented to : [place] / [place]  Expected Discharge Plan and Services Expected Discharge Plan: [place] In-house Referral: Clinical Social Work Discharge Planning Services: CM Consult Post Acute Care Choice: [place] Living arrangements for the past 2 months: [name]                                      Prior Living Arrangements/Services Living arrangements for the past 2 months: [place] Lives with:: Facility Resident Patient language and need for interpreter reviewed:: Yes Do you feel safe going back to the place where you live?: Yes      Need for Family Participation in Patient Care: Yes (Comment) Care giver support system in place?: Yes (comment)   Criminal Activity/Legal Involvement Pertinent to Current Situation/Hospitalization: No - Comment as needed  Activities of Daily Living Home Assistive Devices/Equipment: None ADL Screening (condition at time of admission) Patient's cognitive ability adequate to  safely complete daily activities?: Yes Is the patient deaf or have difficulty hearing?: No Does the patient have difficulty seeing, even when wearing glasses/contacts?: No Does the patient have difficulty concentrating, remembering, or making decisions?: No Patient able to express need for assistance with ADLs?: Yes Does the patient have difficulty dressing or bathing?: Yes Independently performs ADLs?: No Does the patient have difficulty walking or climbing stairs?: Yes Weakness of Legs: Left Weakness of Arms/Hands: None  Permission Sought/Granted                  Emotional Assessment         Alcohol / Substance Use: Not Applicable Psych Involvement: No (comment)  Admission diagnosis:  Acute cystitis without hematuria [N30.00] Fall, initial encounter [W19.XXXA] Closed fracture of multiple pubic rami, left, initial encounter ([place]) [S32.592A] Leukocytosis, unspecified type [D72.829] Patient Active Problem List   Diagnosis Date Noted   Closed fracture of multiple pubic rami, left, initial encounter ([place]) 07/10/2022   HCAP (healthcare-associated pneumonia) 09/16/2018   Syncope 05/11/2018   COPD (chronic obstructive pulmonary disease) ([place]) 05/11/2018   Acute respiratory failure with hypoxia ([place]) 05/07/2018   Hypertension    Diabetes mellitus, type 2 ([place])    Cholelithiasis 12/11/2016   Abdominal pain 12/11/2016   Back pain, thoracic 10/14/2013   Mild cognitive impairment with memory loss 10/04/2013   Syncope and collapse 08/26/2013   Other and unspecified hyperlipidemia 05/14/2013   Unspecified constipation 05/14/2013   Osteoarthritis of left hip  01/11/2013   History of syncope 11/15/2012   Ankle fracture, left 11/15/2012   H/O acute renal failure 11/15/2012   Proximal muscle weakness 10/30/2012   Neurotic excoriations 09/02/2012   Loss of balance 06/06/2012   Melanoma ([place])    Asthma    Insomnia, persistent 09/01/2011   Severe major depression with psychotic  features ([place]) 09/01/2011   Obesity (BMI 30-39.9) 09/01/2011   Obesity, diabetes, and hypertension syndrome ([place]) 09/01/2011   COPD with acute exacerbation ([place]) 01/13/2008   PCP:  Carrolyn Meiers, MD Pharmacy:   [facility] [place] [address] [place] [address] Phone: [phone] Fax: [phone]  [id number] [place] [address]: [phone]: [phone]  [id number] [address]one: [phone]: [phone]   Social Determinants of Health (SDOH) Interventions Housing Interventions: Intervention Not Indicated  Readmission Risk Interventions     No data to display

## 2022-07-11 NOTE — Progress Notes (Signed)
PROGRESS NOTE     Sheena [name], is a 88 y.o. female, DOB - 10/26/1923, RSW:[id number]  Admit date - 07/10/2022   Admitting Physician Sheena Washabaugh Denton Brick, MD  Outpatient Primary MD for the patient is [name], [name], MD  LOS - 1  Chief Complaint  Patient presents with   Fall        Brief Narrative:   88 y.o. female with pmhx relevant for depression/demention, HTN , DM2.Marland Kitchen  COPD [facility] on [facility]/10/[facility]22 [facility] [place] [facility] with Lt hip pain after mechanical fall and found to have pubic rami fractures  A/p 1)UTI -WBC over 20,000 -Generalized weakness and falls -Did Not meet sepsis criteria -Continue IV Rocephin pending urine  cultures   2)Acute left pubic rami fractures----after mechanical fall -Get orthopedic consult -Get PT and OT consult -Pain control appears adequate   3)acute hypoxic respiratory failure--- related to opiate use for pain control in the setting of underlying COPD 07/11/22 -Attempted to wean off oxygen patient desaturated down to 83% on room air -Currently on 3 L of oxygen via nasal cannula -IV steroids and bronchodilators as ordered   4)Social/Ethics-documentation reflects DNR status, patient is a poor historian overall Code status verified with daughter [name] [phone]--- -Attempted to reach patient's daughters [name] [phone] unable to reach her, left voicemails  -I spoke with daughter [name] [phone]--- HCPOA--is [name] [phone]   5)DM2-continue Lantus insulin Use Novolog/Humalog Sliding scale insulin with Accu-Cheks/Fingersticks as ordered    6)HTN-continue amlodipine, - May use IV hydralazine as needed elevated BP    7)Dementia and depression--- continue trazodone as needed, continue Zoloft 75 mg daily and Seroquel 12.5 mg  8)COPD/rib fractures-- -CT chest with bronchiectasis, pulm Neri fibrosis and possible interstitial pneumonia, , Left-sided second third and fourth rib  fractures  no acute exacerbation, continue bronchodilators -Incentive spirometry as ordered   9) Recurrent Falls--- at baseline patient ambulates with a walker -Had a mechanical fall- No head injury no nausea no vomiting no chest pains and palpitations no syncopal type symptoms -CT head and CT C-spine without acute findings PTA pt lived at [facility] and did very poorly, patient has significant limitations with mobility related ADLs- this patient needs to continue to be monitored in the hospital until a SNF bed is obtained as she is not safe to go home with her current physcical limitations -Physical therapy and Occupational Therapy eval requested   10) chronic anemia-Hgb 9.8 which is close to prior baseline, no bleeding concerns at this time monitor closely     Status is: Inpatient   Remains inpatient appropriate because:    Dispo: The patient is from: ALF              Anticipated d/c is to: SNF              Anticipated d/c date is: 2 days              Patient currently is not medically stable to d/c. Barriers: Not Clinically Stable-   Code Status :  -  Code Status: DNR   Family Communication: spoke with daughter [name] [phone]--- HCPOA--is [name] [phone]  DVT Prophylaxis  :   - SCDs   heparin injection 5,000 Units Start: 07/10/22 1800 SCDs Start: 07/10/22 1559 Place TED hose Start: 07/10/22 1559   Lab Results  Component Value Date   PLT 202 07/10/2022    Inpatient Medications  Scheduled Meds:  amLODipine  5 mg Oral  Daily   atorvastatin  40 mg Oral Daily   heparin  5,000 Units Subcutaneous Q8H   insulin aspart  0-15 Units Subcutaneous TID WC   insulin aspart  0-5 Units Subcutaneous QHS   insulin glargine-yfgn  20 Units Subcutaneous QHS   ipratropium-albuterol  3 mL Nebulization QID   methocarbamol  500 mg Oral TID   multivitamin with minerals  1 tablet Oral Daily   polyethylene glycol  17 g Oral Daily   QUEtiapine  12.5 mg Oral QHS    senna-docusate  2 tablet Oral QHS   sertraline  75 mg Oral Daily   sodium chloride flush  3 mL Intravenous Q12H   sodium chloride flush  3 mL Intravenous Q12H   Continuous Infusions:  sodium chloride 10 mL/hr at 07/11/22 1241   azithromycin Stopped (07/10/22 2048)   cefTRIAXone (ROCEPHIN)  IV 1 g (07/11/22 1243)   PRN Meds:.sodium chloride, acetaminophen **OR** acetaminophen, acetaminophen, albuterol, ALPRAZolam, bisacodyl, guaiFENesin, guaiFENesin-dextromethorphan, hydrALAZINE, ondansetron **OR** ondansetron (ZOFRAN) IV, oxyCODONE, sodium chloride flush, traZODone   Anti-infectives (From admission, onward)    Start     Dose/Rate Route Frequency Ordered Stop   07/11/22 1200  cefTRIAXone (ROCEPHIN) 1 g in sodium chloride 0.9 % 100 mL IVPB        1 g 200 mL/hr over 30 Minutes Intravenous Every 24 hours 07/10/22 1555     07/10/22 1930  azithromycin (ZITHROMAX) 500 mg in sodium chloride 0.9 % 250 mL IVPB        500 mg 250 mL/hr over 60 Minutes Intravenous Every 24 hours 07/10/22 1917     07/10/22 1300  cefTRIAXone (ROCEPHIN) 2 g in sodium chloride 0.9 % 100 mL IVPB        2 g 200 mL/hr over 30 Minutes Intravenous  Once 07/10/22 1255 07/10/22 1336         Subjective: [name] today has no fevers, no emesis,  No chest pain,   07/11/22 -Attempted to wean off oxygen patient desaturated down to 83% on room air -Currently on 3 L of oxygen via nasal cannula -Spoke with daughter Latrelle Dodrill again   Objective: Vitals:   07/11/22 0819 07/11/22 1056 07/11/22 1430 07/11/22 1505  BP: (!) 142/62  (!) 125/47   Pulse: 92  100   Resp: 19  19   Temp: 97.8 F (36.6 C)  98.2 F (36.8 C)   TempSrc: Oral     SpO2: 100% (!) 88% 94% 95%  Weight:      Height:        Intake/Output Summary (Last 24 hours) at 07/11/2022 1711 Last data filed at 07/11/2022 1500 Gross per 24 hour  Intake 812.68 ml  Output 200 ml  Net 612.68 ml   Filed Weights   07/10/22 0752 07/10/22 2028  Weight:  81.6 kg 81 kg    Physical Exam  Gen:- Awake Alert, frail and elderly appearing HEENT:- [place].AT, No sclera icterus, HOH Nose- [place] 3L/min Neck-Supple Neck,No JVD,.  Lungs-diminished breath sounds, sounds congested  CV- S1, S2 normal, regular  Abd-  +ve B.Sounds, Abd Soft, No tenderness,    Extremity - pedal pulses present  Psych-affect is appropriate, oriented x3 Neuro-no new focal deficits, no tremors -Skin - warm, dry, left elbow skin tear MSK-  pain with range of motion of left lower extremity and hips  Data Reviewed: I have personally reviewed following labs and imaging studies  CBC: Recent Labs  Lab 07/10/22 1000  WBC 20.4*  NEUTROABS 18.4*  HGB 9.8*  HCT 29.9*  MCV 90.9  PLT 539   Basic Metabolic Panel: Recent Labs  Lab 07/10/22 1000  NA 140  K 3.5  CL 101  CO2 27  GLUCOSE 103*  BUN 17  CREATININE 1.19*  CALCIUM 8.3*   GFR: Estimated Creatinine Clearance: 29.7 mL/min (A) (by C-G formula based on SCr of 1.19 mg/dL (H)). Radiology Studies: CT Chest Wo Contrast  Result Date: 07/10/2022 CLINICAL DATA:  Pleural effusion, evaluate for malignancy EXAM: CT CHEST WITHOUT CONTRAST TECHNIQUE: Multidetector CT imaging of the chest was performed following the standard protocol without IV contrast. RADIATION DOSE REDUCTION: This exam was performed according to the departmental dose-optimization program which includes automated exposure control, adjustment of the mA and/or kV according to patient size and/or use of iterative reconstruction technique. COMPARISON:  Previous chest radiographs including the examination done on 07/10/2022 and CT chest done on 05/01/2010 FINDINGS: Cardiovascular: Scattered calcifications are seen in thoracic aorta and its major branches. There are small scattered coronary artery calcifications are noted. There is dense calcification in the region of mitral annulus. There is ectasia of main pulmonary artery measuring 3.8 cm suggesting pulmonary arterial  hypertension. There is ectasia of ascending thoracic aorta measuring 3.8 cm. Mediastinum/Nodes: There is slight interval increase in size of lymph nodes in mediastinum measuring up to 10 mm in short axis. In the previous CT, lymph nodes were seen in the mediastinum measuring up to 8.6 mm in short axis measurement. There is 1.4 cm smooth marginated fluid density nodule in the posterior right lobe of thyroid. There is increase in size since the previous study. No follow-up is recommended. Lungs/Pleura: There is ectasia of bronchi in both lungs, more so in the lower lung fields with interval worsening. Increased interstitial markings are seen in the periphery of both lungs, more so in the lower lung fields with interval progression of pulmonary fibrosis. There are scattered faint ground-glass densities in mid and lower lung fields. There is no focal consolidation. There is no pleural effusion or pneumothorax. Upper Abdomen: Small hiatal hernia is seen. Scattered calcifications are seen in abdominal aorta. There is 11 mm exophytic low-density lesion in the posterolateral aspect of upper pole of left kidney suggesting possible renal cyst. No follow-up imaging is recommended. Musculoskeletal: Recent undisplaced fractures are seen in the posterior aspects of left second and third ribs. Deformities are noted in the anterior/anterolateral aspect of left second, third and fourth ribs, possibly recent fractures. There is encroachment of neural foramina by bony spurs in the visualized lower cervical spine. Small bony spurs are seen in both shoulders. IMPRESSION: Bronchiectasis. Increased interstitial markings are seen in the periphery of both lungs with interval progression suggesting worsening of pulmonary fibrosis. There are faint ground-glass densities in the mid and lower lung fields suggesting interstitial lung disease or interstitial pneumonia. There is no focal pulmonary consolidation. There is no pleural effusion. There  is slight increase in size of lymph nodes in mediastinum, possibly suggesting reactive hyperplasia. There is ectasia of main pulmonary artery suggesting pulmonary arterial hypertension. Scattered coronary artery calcifications are seen. Aortic atherosclerosis. Recent fractures are seen in left second, third and fourth ribs. There is no pneumothorax. Small hiatal hernia. Other findings as described in the body of the report. Electronically Signed   By: Elmer Picker M.D.   On: 07/10/2022 12:29   CT Cervical Spine Wo Contrast  Result Date: 07/10/2022 CLINICAL DATA:  Neck trauma (Age >= 65y) EXAM: CT CERVICAL SPINE WITHOUT CONTRAST TECHNIQUE: Multidetector  CT imaging of the cervical spine was performed without intravenous contrast. Multiplanar CT image reconstructions were also generated. RADIATION DOSE REDUCTION: This exam was performed according to the departmental dose-optimization program which includes automated exposure control, adjustment of the mA and/or kV according to patient size and/or use of iterative reconstruction technique. COMPARISON:  10/20/2020. FINDINGS: Alignment: Reversed cervical lordosis noted consistent with sequela of degenerative changes versus muscle spasm or positioning. No suggestion of an acute traumatic etiology for this finding. Skull base and vertebrae: No acute fracture. No primary bone lesion or focal pathologic process. Soft tissues and spinal canal: No prevertebral fluid or swelling. No visible canal hematoma. Disc levels: Multilevel disc disease identified with loss of disc spaces and marginal osteophytes extending from C4 through C7. Upper chest: Negative. IMPRESSION: Degenerative changes. There are no acute traumatic abnormalities identified. Electronically Signed   By: Sammie Bench M.D.   On: 07/10/2022 09:30   CT Head Wo Contrast  Result Date: 07/10/2022 CLINICAL DATA:  Head trauma, intracranial arterial injury suspected EXAM: CT HEAD WITHOUT CONTRAST  TECHNIQUE: Contiguous axial images were obtained from the base of the skull through the vertex without intravenous contrast. RADIATION DOSE REDUCTION: This exam was performed according to the departmental dose-optimization program which includes automated exposure control, adjustment of the mA and/or kV according to patient size and/or use of iterative reconstruction technique. COMPARISON:  10/20/2020 FINDINGS: Brain: There is periventricular white matter decreased attenuation consistent with small vessel ischemic changes. Ventricles, sulci and cisterns are prominent consistent with age related involutional changes. No acute intracranial hemorrhage, mass effect or shift. No hydrocephalus. Vascular: No hyperdense vessel or unexpected calcification. Skull: Normal. Negative for fracture or focal lesion. Sinuses/Orbits: Mild chronic left maxillary sinusitis. IMPRESSION: Atrophy and chronic small vessel ischemic changes. No acute intracranial process identified. Electronically Signed   By: Sammie Bench M.D.   On: 07/10/2022 09:27   DG Hip Unilat W or Wo Pelvis 2-3 Views Left  Result Date: 07/10/2022 CLINICAL DATA:  Fall EXAM: DG HIP (WITH OR WITHOUT PELVIS) 3V LEFT COMPARISON:  10/20/2020. FINDINGS: Osseous structures are osteopenic. Left total hip arthroplasty. Prosthesis appears intact without Periprosthetic lucency. There are acute fractures of the inferior and superior pubic rami on the left displaced by a few mm. The pelvic ring appears otherwise intact. CT may be helpful to assess for additional radiographically occult fractures. IMPRESSION: Acute left pubic rami fractures. Status post left total hip arthroplasty prosthesis appears intact. Electronically Signed   By: Sammie Bench M.D.   On: 07/10/2022 09:22   DG Chest Port 1 View  Result Date: 07/10/2022 CLINICAL DATA:  Fall EXAM: PORTABLE CHEST 1 VIEW COMPARISON:  01/06/2022 FINDINGS: The cardiac silhouette appears prominent. There is pulmonary  vascular congestion. There is minimal left basilar consolidation or volume loss. There may be small left-sided pleural effusion. No pneumothorax identified. Aorta is calcified. There are thoracic degenerative changes. IMPRESSION: Minimal left basilar consolidation and possible effusion. Pulmonary vascular congestion. Electronically Signed   By: Sammie Bench M.D.   On: 07/10/2022 09:19     Scheduled Meds:  amLODipine  5 mg Oral Daily   atorvastatin  40 mg Oral Daily   heparin  5,000 Units Subcutaneous Q8H   insulin aspart  0-15 Units Subcutaneous TID WC   insulin aspart  0-5 Units Subcutaneous QHS   insulin glargine-yfgn  20 Units Subcutaneous QHS   ipratropium-albuterol  3 mL Nebulization QID   methocarbamol  500 mg Oral TID   multivitamin with minerals  1 tablet Oral Daily   polyethylene glycol  17 g Oral Daily   QUEtiapine  12.5 mg Oral QHS   senna-docusate  2 tablet Oral QHS   sertraline  75 mg Oral Daily   sodium chloride flush  3 mL Intravenous Q12H   sodium chloride flush  3 mL Intravenous Q12H   Continuous Infusions:  sodium chloride 10 mL/hr at 07/11/22 1241   azithromycin Stopped (07/10/22 2048)   cefTRIAXone (ROCEPHIN)  IV 1 g (07/11/22 1243)     LOS: 1 day    Roxan Hockey M.D on 07/11/2022 at 5:11 PM  Go to [email or web address] - for contact info  [facility] - Office  [phone]  If 7PM-7AM, please contact night-coverage [email or web address] 07/11/2022, 5:11 PM

## 2022-07-11 NOTE — NC FL2 (Signed)
[place] LEVEL OF CARE FORM     IDENTIFICATION  Patient Name: Sheena [name] Birthdate: 04-07-1924 Sex: female Admission Date (Current Location): 07/10/2022  [facility] and [place] Number:  [employer] and Address:  [place] [address], [place]      Provider Number: [id number]  Attending Physician Name and Address:  Roxan Hockey, MD  Relative Name and Phone Number:       Current Level of Care: Hospital Recommended Level of Care: [place] Prior Approval Number:    Date Approved/Denied:   PASRR Number:    Discharge Plan: SNF    Current Diagnoses: Patient Active Problem List   Diagnosis Date Noted   Closed fracture of multiple pubic rami, left, initial encounter ([place]) 07/10/2022   HCAP (healthcare-associated pneumonia) 09/16/2018   Syncope 05/11/2018   COPD (chronic obstructive pulmonary disease) ([place]) 05/11/2018   Acute respiratory failure with hypoxia ([place]) 05/07/2018   Hypertension    Diabetes mellitus, type 2 ([place])    Cholelithiasis 12/11/2016   Abdominal pain 12/11/2016   Back pain, thoracic 10/14/2013   Mild cognitive impairment with memory loss 10/04/2013   Syncope and collapse 08/26/2013   Other and unspecified hyperlipidemia 05/14/2013   Unspecified constipation 05/14/2013   Osteoarthritis of left hip 01/11/2013   History of syncope 11/15/2012   Ankle fracture, left 11/15/2012   H/O acute renal failure 11/15/2012   Proximal muscle weakness 10/30/2012   Neurotic excoriations 09/02/2012   Loss of balance 06/06/2012   Melanoma ([place])    Asthma    Insomnia, persistent 09/01/2011   Severe major depression with psychotic features ([place]) 09/01/2011   Obesity (BMI 30-39.9) 09/01/2011   Obesity, diabetes, and hypertension syndrome ([place]) 09/01/2011   COPD with acute exacerbation ([place]) 01/13/2008    Orientation RESPIRATION BLADDER Height & Weight     Self, Situation, Place  Normal  Incontinent, External catheter Weight: 178 lb 9.2 oz (81 kg) Height:  '5\' 6"'$  (167.6 cm)  BEHAVIORAL SYMPTOMS/MOOD NEUROLOGICAL BOWEL NUTRITION STATUS      Continent Diet (Heart healthy)  AMBULATORY STATUS COMMUNICATION OF NEEDS Skin   Extensive Assist Verbally Normal                       Personal Care Assistance Level of Assistance  Bathing, Feeding, Dressing Bathing Assistance: Limited assistance Feeding assistance: Independent Dressing Assistance: Limited assistance     Functional Limitations Info  Sight, Hearing, Speech Sight Info: Adequate Hearing Info: Adequate Speech Info: Adequate    SPECIAL CARE FACTORS FREQUENCY  PT (By licensed PT), OT (By licensed OT)     PT Frequency: 5 times weekly OT Frequency: 5 times weekly            Contractures Contractures Info: Not present    Additional Factors Info  Code Status, Allergies Code Status Info: DNR Allergies Info: NKA           Current Medications (07/11/2022):  This is the current hospital active medication list Current Facility-Administered Medications  Medication Dose Route Frequency Provider Last Rate Last Admin   0.9 %  sodium chloride infusion   Intravenous PRN Roxan Hockey, MD 10 mL/hr at 07/11/22 1241 New Bag at 07/11/22 1241   acetaminophen (TYLENOL) tablet 650 mg  650 mg Oral Q6H PRN Roxan Hockey, MD   650 mg at 07/11/22 0946   Or   acetaminophen (TYLENOL) suppository 650 mg  650 mg Rectal Q6H PRN Emokpae,  Courage, MD       acetaminophen (TYLENOL) tablet 650 mg  650 mg Oral Q6H PRN Emokpae, Courage, MD       albuterol (PROVENTIL) (2.5 MG/3ML) 0.083% nebulizer solution 2.5 mg  2.5 mg Nebulization Q2H PRN Emokpae, Courage, MD       ALPRAZolam [name]) tablet 0.5 mg  0.5 mg Oral Daily PRN Emokpae, Courage, MD       amLODipine (NORVASC) tablet 5 mg  5 mg Oral Daily Emokpae, Courage, MD   5 mg at 07/11/22 0937   atorvastatin (LIPITOR) tablet 40 mg  40 mg Oral Daily Emokpae, Courage, MD   40 mg at  07/11/22 0937   azithromycin (ZITHROMAX) 500 mg in sodium chloride 0.9 % 250 mL IVPB  500 mg Intravenous Q24H Roxan Hockey, MD   Stopped at 07/10/22 2048   bisacodyl (DULCOLAX) suppository 10 mg  10 mg Rectal Daily PRN Emokpae, Courage, MD       cefTRIAXone (ROCEPHIN) 1 g in sodium chloride 0.9 % 100 mL IVPB  1 g Intravenous Q24H Emokpae, Courage, MD 200 mL/hr at 07/11/22 1243 1 g at 07/11/22 1243   guaiFENesin (MUCINEX) 12 hr tablet 600 mg  600 mg Oral BID PRN Emokpae, Courage, MD       guaiFENesin-dextromethorphan (ROBITUSSIN DM) 100-10 MG/5ML syrup 5 mL  5 mL Oral Q4H PRN Emokpae, Courage, MD       heparin injection 5,000 Units  5,000 Units Subcutaneous Q8H Emokpae, Courage, MD   5,000 Units at 07/11/22 0542   hydrALAZINE (APRESOLINE) injection 10 mg  10 mg Intravenous Q6H PRN Emokpae, Courage, MD       insulin aspart (novoLOG) injection 0-15 Units  0-15 Units Subcutaneous TID WC Emokpae, Courage, MD   5 Units at 07/11/22 1246   insulin aspart (novoLOG) injection 0-5 Units  0-5 Units Subcutaneous QHS Emokpae, Courage, MD       insulin glargine-yfgn (SEMGLEE) injection 20 Units  20 Units Subcutaneous QHS Emokpae, Courage, MD   20 Units at 07/10/22 2226   ipratropium-albuterol (DUONEB) 0.5-2.5 (3) MG/3ML nebulizer solution 3 mL  3 mL Nebulization QID Emokpae, Courage, MD   3 mL at 07/11/22 1055   methocarbamol (ROBAXIN) tablet 500 mg  500 mg Oral TID Roxan Hockey, MD   500 mg at 07/11/22 9021   multivitamin with minerals tablet 1 tablet  1 tablet Oral Daily Emokpae, Courage, MD   1 tablet at 07/11/22 0936   ondansetron (ZOFRAN) tablet 4 mg  4 mg Oral Q6H PRN Emokpae, Courage, MD       Or   ondansetron (ZOFRAN) injection 4 mg  4 mg Intravenous Q6H PRN Emokpae, Courage, MD   4 mg at 07/10/22 1639   oxyCODONE (Oxy IR/ROXICODONE) immediate release tablet 5 mg  5 mg Oral Q4H PRN Emokpae, Courage, MD   5 mg at 07/11/22 0946   polyethylene glycol (MIRALAX / GLYCOLAX) packet 17 g  17 g Oral Daily  Emokpae, Courage, MD   17 g at 07/11/22 0934   QUEtiapine (SEROQUEL) tablet 12.5 mg  12.5 mg Oral QHS Emokpae, Courage, MD   12.5 mg at 07/10/22 2134   senna-docusate (Senokot-S) tablet 2 tablet  2 tablet Oral QHS Emokpae, Courage, MD   2 tablet at 07/10/22 2134   sertraline (ZOLOFT) tablet 75 mg  75 mg Oral Daily Emokpae, Courage, MD   75 mg at 07/11/22 0934   sodium chloride flush (NS) 0.9 % injection 3 mL  3 mL Intravenous Q12H Emokpae,  Courage, MD       sodium chloride flush (NS) 0.9 % injection 3 mL  3 mL Intravenous Q12H Emokpae, Courage, MD   3 mL at 07/11/22 0949   sodium chloride flush (NS) 0.9 % injection 3 mL  3 mL Intravenous PRN Emokpae, Courage, MD       traZODone (DESYREL) tablet 50 mg  50 mg Oral QHS PRN Roxan Hockey, MD         Discharge Medications: Please see discharge summary for a list of discharge medications.  Relevant Imaging Results:  Relevant Lab Results:   Additional Information SSN: [id number] [place]

## 2022-07-11 NOTE — Plan of Care (Signed)
  Problem: Acute Rehab PT Goals(only PT should resolve) Goal: Pt Will Go Supine/Side To Sit Outcome: Progressing Flowsheets (Taken 07/11/2022 1332) Pt will go Supine/Side to Sit: with moderate assist Goal: Patient Will Transfer Sit To/From Stand Outcome: Progressing Flowsheets (Taken 07/11/2022 1332) Patient will transfer sit to/from stand:  with minimal assist  with moderate assist Goal: Pt Will Transfer Bed To Chair/Chair To Bed Outcome: Progressing Flowsheets (Taken 07/11/2022 1332) Pt will Transfer Bed to Chair/Chair to Bed:  with min assist  with mod assist Goal: Pt Will Ambulate Outcome: Progressing Flowsheets (Taken 07/11/2022 1332) Pt will Ambulate:  15 feet  with moderate assist  with rolling walker   1:33 PM, 07/11/22 Lonell Grandchild, MPT Physical Therapist with [facility] [phone] office [phone] mobile phone

## 2022-07-11 NOTE — Evaluation (Signed)
Physical Therapy Evaluation Patient Details Name: Sheena [name] MRN: [id number] DOB: Aug 05, 1924 Today's Date: 07/11/2022  History of Present Illness  Sheena [name]  is a 88 y.o. female with pmhx relevant for depression/demention, HTN , DM2.Marland Kitchen  [name] with Lt hip pain after falling  -At baseline patient ambulates with a walker, she apparently was going to the bathroom and tripped and fell landing on the left hip also has a skin tear to her left elbow  -Apparently no loss of consciousness  -No head injury no nausea no vomiting no chest pains and palpitations no syncopal type symptoms  - patient is a poor historian overall, so additional history and Code status verified with daughter Sheena [name] [phone]---  -Attempted to reach patient's daughters Ms Sheena [name] [phone] unable to reach her, left voicemails   -I spoke with daughter Sheena [name] [phone]---  -hip x-rays show left-sided pubic rami fractures  -CT head and CT C-spine without acute findings  -CT chest with bronchiectasis, pulm Neri fibrosis and possible interstitial pneumonia,  , Left-sided second third and fourth rib fractures   -BNP 58  -UA suggestive of UTI  -WBC 20.4 hemoglobin 9.8 which is close to prior baseline platelets 202  -Creatinine 1.1   Clinical Impression  Patient requires Max assist for sitting up at bedside mostly due to c/o severe pain LLE with any movement, required much encouragement for attempting sit to stands due to left hip pain, able to take a few side steps with Max/mod assist and tolerated sitting up in chair after therapy - nursing staff aware.  Patient will benefit from continued skilled physical therapy in hospital and recommended venue below to increase strength, balance, endurance for safe ADLs and gait.        Recommendations for follow up therapy are one component of a multi-disciplinary discharge planning process, led by the attending  physician.  Recommendations may be updated based on patient status, additional functional criteria and insurance authorization.  Follow Up Recommendations Skilled nursing-short term rehab (<3 hours/day) Can patient physically be transported by private vehicle: No    Assistance Recommended at Discharge Intermittent Supervision/Assistance  Patient can return home with the following  A lot of help with bathing/dressing/bathroom;A lot of help with walking and/or transfers;Help with stairs or ramp for entrance;Assistance with cooking/housework    Equipment Recommendations None recommended by PT  Recommendations for Other Services       Functional Status Assessment Patient has had a recent decline in their functional status and demonstrates the ability to make significant improvements in function in a reasonable and predictable amount of time.     Precautions / Restrictions Precautions Precautions: Fall Restrictions Weight Bearing Restrictions: No      Mobility  Bed Mobility Overal bed mobility: Needs Assistance Bed Mobility: Supine to Sit     Supine to sit: Max assist     General bed mobility comments: slow labored movement with c/o severe pain LLE with movement    Transfers Overall transfer level: Needs assistance Equipment used: Rolling walker (2 wheels) Transfers: Sit to/from Stand, Bed to chair/wheelchair/BSC Sit to Stand: Mod assist   Step pivot transfers: Mod assist, Max assist       General transfer comment: unsteady labored movement with poor tolerance for weight bearing on LLE    Ambulation/Gait Ambulation/Gait assistance: Max assist, Mod assist Gait Distance (Feet): 4 Feet Assistive device: Rolling walker (2 wheels) Gait Pattern/deviations: Decreased step length - right, Decreased step  length - left, Decreased stance time - left, Decreased stride length, Antalgic Gait velocity: slow     General Gait Details: limited to a few slow labored side steps with  poor tolerance for weightbearing on LLE due to increased pain  Stairs            Wheelchair Mobility    Modified Rankin (Stroke Patients Only)       Balance Overall balance assessment: Needs assistance Sitting-balance support: Feet supported, No upper extremity supported Sitting balance-Leahy Scale: Fair Sitting balance - Comments: fair/good seated at EOB   Standing balance support: During functional activity, Bilateral upper extremity supported, Reliant on assistive device for balance Standing balance-Leahy Scale: Poor Standing balance comment: using RW                             Pertinent Vitals/Pain Pain Assessment Pain Assessment: Faces Faces Pain Scale: Hurts even more Pain Location: left hip, left side of rib cage Pain Descriptors / Indicators: Grimacing, Guarding, Sore Pain Intervention(s): Limited activity within patient's tolerance, Monitored during session, Premedicated before session, Repositioned    Home Living Family/patient expects to be discharged to:: Assisted living                 Home Equipment: Conservation officer, nature (2 wheels);Rollator (4 wheels) Additional Comments: Patient is poor historian    Prior Function Prior Level of Function : Needs assist       Physical Assist : Mobility (physical);ADLs (physical) Mobility (physical): Bed mobility;Transfers;Gait;Stairs   Mobility Comments: Supervised household ambulator using RW ADLs Comments: assisted by ALF staff     Hand Dominance        Extremity/Trunk Assessment   Upper Extremity Assessment Upper Extremity Assessment: Generalized weakness    Lower Extremity Assessment Lower Extremity Assessment: Generalized weakness;LLE deficits/detail LLE Deficits / Details: grossly -3/5 LLE: Unable to fully assess due to pain LLE Sensation: decreased light touch LLE Coordination: decreased fine motor    Cervical / Trunk Assessment Cervical / Trunk Assessment: Kyphotic   Communication   Communication: Other (comment) (mostly non-verbal)  Cognition Arousal/Alertness: Awake/alert Behavior During Therapy: Anxious Overall Cognitive Status: History of cognitive impairments - at baseline                                          General Comments      Exercises     Assessment/Plan    PT Assessment Patient needs continued PT services  PT Problem List Decreased strength;Decreased activity tolerance;Decreased balance;Decreased mobility       PT Treatment Interventions DME instruction;Gait training;Stair training;Functional mobility training;Therapeutic exercise;Patient/family education;Balance training    PT Goals (Current goals can be found in the Care Plan section)  Acute Rehab PT Goals Patient Stated Goal: return home PT Goal Formulation: With patient Time For Goal Achievement: 07/25/22 Potential to Achieve Goals: Good    Frequency Min 3X/week     Co-evaluation               AM-PAC PT "6 Clicks" Mobility  Outcome Measure Help needed turning from your back to your side while in a flat bed without using bedrails?: A Lot Help needed moving from lying on your back to sitting on the side of a flat bed without using bedrails?: A Lot Help needed moving to and from a bed to a chair (including  a wheelchair)?: A Lot Help needed standing up from a chair using your arms (e.g., wheelchair or bedside chair)?: A Lot Help needed to walk in hospital room?: A Lot Help needed climbing 3-5 steps with a railing? : Total 6 Click Score: 11    End of Session   Activity Tolerance: Patient tolerated treatment well;Patient limited by fatigue;Patient limited by pain Patient left: in chair;with call bell/phone within reach;with family/visitor present;with nursing/sitter in room Nurse Communication: Mobility status PT Visit Diagnosis: Unsteadiness on feet (R26.81);Other abnormalities of gait and mobility (R26.89);Muscle weakness (generalized)  (M62.81)    Time: 3524-8185 PT Time Calculation (min) (ACUTE ONLY): 27 min   Charges:   PT Evaluation $PT Eval Moderate Complexity: 1 Mod PT Treatments $Therapeutic Activity: 23-37 mins        1:31 PM, 07/11/22 Lonell Grandchild, MPT Physical Therapist with [facility] [phone] office [phone] mobile phone

## 2022-07-12 DIAGNOSIS — S32592A Other specified fracture of left pubis, initial encounter for closed fracture: Secondary | ICD-10-CM | POA: Diagnosis not present

## 2022-07-12 LAB — BASIC METABOLIC PANEL
Anion gap: 4 — ABNORMAL LOW (ref 5–15)
BUN: 31 mg/dL — ABNORMAL HIGH (ref 8–23)
CO2: 27 mmol/L (ref 22–32)
Calcium: 8 mg/dL — ABNORMAL LOW (ref 8.9–10.3)
Chloride: 106 mmol/L (ref 98–111)
Creatinine, Ser: 1.1 mg/dL — ABNORMAL HIGH (ref 0.44–1.00)
GFR, Estimated: 46 mL/min — ABNORMAL LOW (ref >60)
Glucose, Bld: 126 mg/dL — ABNORMAL HIGH (ref 70–99)
Potassium: 4.3 mmol/L (ref 3.5–5.1)
Sodium: 137 mmol/L (ref 135–145)

## 2022-07-12 LAB — GLUCOSE, CAPILLARY
Glucose-Capillary: 115 mg/dL — ABNORMAL HIGH (ref 70–99)
Glucose-Capillary: 99 mg/dL (ref 70–99)
Glucose-Capillary: 205 mg/dL — ABNORMAL HIGH (ref 70–99)
Glucose-Capillary: 184 mg/dL — ABNORMAL HIGH (ref 70–99)

## 2022-07-12 MED ORDER — IPRATROPIUM-ALBUTEROL 0.5-2.5 (3) MG/3ML IN SOLN
3.0000 mL | Freq: Four times a day (QID) | RESPIRATORY_TRACT | Status: DC | PRN
  Administered 2022-07-16 – 2022-07-18 (×2): 3 mL via RESPIRATORY_TRACT
  Filled 2022-07-12: qty 3

## 2022-07-12 NOTE — Progress Notes (Signed)
PROGRESS NOTE     Sheena [name], is a 88 y.o. female, DOB - 1924-01-09, UXL:[id number]  Admit date - 07/10/2022   Admitting Physician Islam Villescas Denton Brick, MD  Outpatient Primary MD for the patient is [name], [name], MD  LOS - 2  Chief Complaint  Patient presents with   Fall        Brief Narrative:   88 y.o. female with pmhx relevant for depression/demention, HTN , DM2.Marland Kitchen  COPD admitted on 07/10/2022 from [facility] with Lt hip pain after mechanical fall and found to have pubic rami fractures  A/p 1)GNR UTI -WBC 20.4 >>12.7 -Generalized weakness and falls -Did Not meet sepsis criteria -Continue IV Rocephin pending urine  cultures   2)Acute Left pubic rami fractures----after mechanical fall -Get orthopedic consult -Physical consult appreciated recommends SNF rehab -Pain control appears adequate   3)acute hypoxic respiratory failure--- related to opiate use for pain control in the setting of underlying COPD and Lt sided rib fractures 07/12/22 -Attempted to wean off oxygen patient desaturated down to 83% on room air -Currently on 2 L of oxygen via nasal cannula -IV steroids and bronchodilators as ordered   4)Social/Ethics-documentation reflects DNR status, patient is a poor historian overall Code status verified with daughter [name] [phone]--- -Attempted to reach patient's daughters [name] [phone] unable to reach her, left voicemails  -I spoke with daughter [name] [phone]--- I called and updated HCPOA--is [name] -[phone]   5)DM2-continue Lantus insulin Use Novolog/Humalog Sliding scale insulin with Accu-Cheks/Fingersticks as ordered    6)HTN-continue amlodipine, - May use IV hydralazine as needed elevated BP    7)Dementia and depression--- continue trazodone as needed, continue Zoloft 75 mg daily and Seroquel 12.5 mg  8)COPD/Rib Fractures/interstitial pneumonia-- -CT chest with  bronchiectasis, pulm  fibrosis and possible interstitial pneumonia, , Left-sided second, third and fourth rib fractures  no acute exacerbation, continue bronchodilators -Incentive spirometry as ordered -Continue Rocephin/azithromycin as ordered   9) Recurrent Falls--- at baseline patient ambulates with a walker -Had a mechanical fall- No head injury no nausea no vomiting no chest pains and palpitations no syncopal type symptoms -CT head and CT C-spine without acute findings PTA pt lived at [facility] and did very poorly, patient has significant limitations with mobility related ADLs- this patient needs to continue to be monitored in the hospital until a SNF bed is obtained as she is not safe to go home with her current physcical limitations -Physical therapy and Occupational Therapy eval requested   10) chronic anemia- Hgb is down to 7.4 from 9.8 - no bleeding concerns at this time monitor closely     Status is: Inpatient   Remains inpatient appropriate because:    Dispo: The patient is from: ALF              Anticipated d/c is to: SNF              Anticipated d/c date is: 2 days              Patient currently is not medically stable to d/c. Barriers: Not Clinically Stable-   Code Status :  -  Code Status: DNR   Family Communication: spoke with daughter [name] [phone]--- I called and updated HCPOA--is [name] -[phone]  DVT Prophylaxis  :   - SCDs   heparin injection 5,000 Units Start: 07/10/22 1800 SCDs Start: 07/10/22 1559 Place TED hose Start: 07/10/22 1559   Lab Results  Component  Value Date   PLT 144 (L) 07/12/2022   Inpatient Medications  Scheduled Meds:  amLODipine  5 mg Oral Daily   atorvastatin  40 mg Oral Daily   dextromethorphan-guaiFENesin  1 tablet Oral BID   heparin  5,000 Units Subcutaneous Q8H   insulin aspart  0-15 Units Subcutaneous TID WC   insulin aspart  0-5 Units Subcutaneous QHS   insulin glargine-yfgn  20 Units  Subcutaneous QHS   methocarbamol  500 mg Oral TID   methylPREDNISolone (SOLU-MEDROL) injection  40 mg Intravenous Q24H   multivitamin with minerals  1 tablet Oral Daily   polyethylene glycol  17 g Oral Daily   QUEtiapine  12.5 mg Oral QHS   senna-docusate  2 tablet Oral QHS   sertraline  75 mg Oral Daily   sodium chloride flush  3 mL Intravenous Q12H   sodium chloride flush  3 mL Intravenous Q12H   Continuous Infusions:  sodium chloride 10 mL/hr at 07/12/22 0640   azithromycin Stopped (07/11/22 2352)   cefTRIAXone (ROCEPHIN)  IV 1 g (07/12/22 1148)   PRN Meds:.sodium chloride, acetaminophen **OR** acetaminophen, acetaminophen, albuterol, ALPRAZolam, bisacodyl, guaiFENesin-dextromethorphan, hydrALAZINE, ipratropium-albuterol, ondansetron **OR** ondansetron (ZOFRAN) IV, mouth rinse, oxyCODONE, sodium chloride flush, traZODone   Anti-infectives (From admission, onward)    Start     Dose/Rate Route Frequency Ordered Stop   07/11/22 1200  cefTRIAXone (ROCEPHIN) 1 g in sodium chloride 0.9 % 100 mL IVPB        1 g 200 mL/hr over 30 Minutes Intravenous Every 24 hours 07/10/22 1555     07/10/22 1930  azithromycin (ZITHROMAX) 500 mg in sodium chloride 0.9 % 250 mL IVPB        500 mg 250 mL/hr over 60 Minutes Intravenous Every 24 hours 07/10/22 1917     07/10/22 1300  cefTRIAXone (ROCEPHIN) 2 g in sodium chloride 0.9 % 100 mL IVPB        2 g 200 mL/hr over 30 Minutes Intravenous  Once 07/10/22 1255 07/10/22 1336       Subjective: Sheena [name] today has no fevers, no emesis,  No chest pain,    07/12/22 I called and updated HCPOA--is [name] -[phone] -Unable to wean off O2 currently down to 2 L by nasal cannula cough and congestion persist   Objective: Vitals:   07/12/22 0525 07/12/22 0530 07/12/22 0750 07/12/22 1334  BP: (!) 141/58   (!) 121/51  Pulse: 80   83  Resp: 19   20  Temp: 98.3 F (36.8 C)   98.8 F (37.1 C)  TempSrc: Oral     SpO2: (!) 84% 100% 100% 100%   Weight:      Height:        Intake/Output Summary (Last 24 hours) at 07/12/2022 1358 Last data filed at 07/12/2022 1148 Gross per 24 hour  Intake 746.27 ml  Output 700 ml  Net 46.27 ml   Filed Weights   07/10/22 0752 07/10/22 2028  Weight: 81.6 kg 81 kg    Physical Exam  Gen:- Awake Alert, frail and elderly appearing HEENT:- [place].AT, No sclera icterus, HOH Nose- [place] 2L/min Neck-Supple Neck,No JVD,.  Lungs-diminished breath sounds, sounds congested  CV- S1, S2 normal, regular  Abd-  +ve B.Sounds, Abd Soft, No tenderness,    Extremity - pedal pulses present  Psych-affect is appropriate, oriented x3 Neuro-generalized weakness, no new focal deficits, no tremors -Skin - warm, dry, left elbow skin tear MSK-discomfort with range of motion of left lower extremity  and hips  Data Reviewed: I have personally reviewed following labs and imaging studies  CBC: Recent Labs  Lab 07/10/22 1000 07/12/22 0517  WBC 20.4* 12.7*  NEUTROABS 18.4*  --   HGB 9.8* 7.4*  HCT 29.9* 23.9*  MCV 90.9 91.9  PLT 202 569*   Basic Metabolic Panel: Recent Labs  Lab 07/10/22 1000 07/12/22 0517  NA 140 137  K 3.5 4.3  CL 101 106  CO2 27 27  GLUCOSE 103* 126*  BUN 17 31*  CREATININE 1.19* 1.10*  CALCIUM 8.3* 8.0*   GFR: Estimated Creatinine Clearance: 32.1 mL/min (A) (by C-G formula based on SCr of 1.1 mg/dL (H)). Radiology Studies: No results found.   Scheduled Meds:  amLODipine  5 mg Oral Daily   atorvastatin  40 mg Oral Daily   dextromethorphan-guaiFENesin  1 tablet Oral BID   heparin  5,000 Units Subcutaneous Q8H   insulin aspart  0-15 Units Subcutaneous TID WC   insulin aspart  0-5 Units Subcutaneous QHS   insulin glargine-yfgn  20 Units Subcutaneous QHS   methocarbamol  500 mg Oral TID   methylPREDNISolone (SOLU-MEDROL) injection  40 mg Intravenous Q24H   multivitamin with minerals  1 tablet Oral Daily   polyethylene glycol  17 g Oral Daily   QUEtiapine  12.5 mg Oral QHS    senna-docusate  2 tablet Oral QHS   sertraline  75 mg Oral Daily   sodium chloride flush  3 mL Intravenous Q12H   sodium chloride flush  3 mL Intravenous Q12H   Continuous Infusions:  sodium chloride 10 mL/hr at 07/12/22 0640   azithromycin Stopped (07/11/22 2352)   cefTRIAXone (ROCEPHIN)  IV 1 g (07/12/22 1148)     LOS: 2 days    Roxan Hockey M.D on 07/12/2022 at 1:58 PM  Go to [email or web address] - for contact info  [facility] - Office  [phone]  If 7PM-7AM, please contact night-coverage [email or web address] 07/12/2022, 1:58 PM

## 2022-07-12 NOTE — Progress Notes (Signed)
RE: Sheena [name] Date Of Birth: 02-24-1924 Date: 07/12/2022  MUST ID: [id number]  To Whom It May Concern:   Please be advised that the above name patient will require a short-term nursing home stay - anticipated 30 days or less rehabilitation and strengthening. The plan is for return home.

## 2022-07-13 DIAGNOSIS — S32592A Other specified fracture of left pubis, initial encounter for closed fracture: Secondary | ICD-10-CM | POA: Diagnosis not present

## 2022-07-13 LAB — BASIC METABOLIC PANEL
Anion gap: 6 (ref 5–15)
BUN: 25 mg/dL — ABNORMAL HIGH (ref 8–23)
CO2: 26 mmol/L (ref 22–32)
Calcium: 7.7 mg/dL — ABNORMAL LOW (ref 8.9–10.3)
Chloride: 106 mmol/L (ref 98–111)
Creatinine, Ser: 0.86 mg/dL (ref 0.44–1.00)
GFR, Estimated: >60 mL/min (ref >60)
Glucose, Bld: 76 mg/dL (ref 70–99)
Potassium: 3.7 mmol/L (ref 3.5–5.1)
Sodium: 138 mmol/L (ref 135–145)

## 2022-07-13 LAB — CBC
HCT: 23.9 % — ABNORMAL LOW (ref 36.0–46.0)
HCT: 22.1 % — ABNORMAL LOW (ref 36.0–46.0)
Hemoglobin: 7.4 g/dL — ABNORMAL LOW (ref 12.0–15.0)
Hemoglobin: 7.1 g/dL — ABNORMAL LOW (ref 12.0–15.0)
MCH: 28.5 pg (ref 26.0–34.0)
MCH: 29.2 pg (ref 26.0–34.0)
MCHC: 31 g/dL (ref 30.0–36.0)
MCHC: 32.1 g/dL (ref 30.0–36.0)
MCV: 91.9 fL (ref 80.0–100.0)
MCV: 90.9 fL (ref 80.0–100.0)
Platelets: 144 K/uL — ABNORMAL LOW (ref 150–400)
Platelets: 119 10*3/uL — ABNORMAL LOW (ref 150–400)
RBC: 2.6 MIL/uL — ABNORMAL LOW (ref 3.87–5.11)
RBC: 2.43 MIL/uL — ABNORMAL LOW (ref 3.87–5.11)
RDW: 15.8 % — ABNORMAL HIGH (ref 11.5–15.5)
RDW: 15.9 % — ABNORMAL HIGH (ref 11.5–15.5)
WBC: 12.7 K/uL — ABNORMAL HIGH (ref 4.0–10.5)
WBC: 14 10*3/uL — ABNORMAL HIGH (ref 4.0–10.5)
nRBC: 0 % (ref 0.0–0.2)
nRBC: 0 % (ref 0.0–0.2)

## 2022-07-13 LAB — URINE CULTURE: Culture: >=100000 — AB

## 2022-07-13 LAB — SARS CORONAVIRUS 2 BY RT PCR: SARS Coronavirus 2 by RT PCR: POSITIVE — AB

## 2022-07-13 LAB — GLUCOSE, CAPILLARY
Glucose-Capillary: 86 mg/dL (ref 70–99)
Glucose-Capillary: 92 mg/dL (ref 70–99)
Glucose-Capillary: 167 mg/dL — ABNORMAL HIGH (ref 70–99)
Glucose-Capillary: 209 mg/dL — ABNORMAL HIGH (ref 70–99)

## 2022-07-13 LAB — HEMOGLOBIN A1C
Hgb A1c MFr Bld: 5.9 % — ABNORMAL HIGH (ref 4.8–5.6)
Mean Plasma Glucose: 123 mg/dL

## 2022-07-13 MED ORDER — PREDNISONE 20 MG PO TABS: 40.0000 mg | ORAL_TABLET | Freq: Every day | ORAL | 0 refills | Status: DC

## 2022-07-13 MED ORDER — SENNOSIDES-DOCUSATE SODIUM 8.6-50 MG PO TABS: 2.0000 | ORAL_TABLET | Freq: Every day | ORAL | 2 refills | Status: AC

## 2022-07-13 MED ORDER — OXYCODONE-ACETAMINOPHEN 5-325 MG PO TABS: ORAL_TABLET | ORAL | 0 refills | Status: DC

## 2022-07-13 MED ORDER — FERROUS SULFATE 325 (65 FE) MG PO TBEC: 325.0000 mg | DELAYED_RELEASE_TABLET | Freq: Every day | ORAL | 3 refills | Status: DC

## 2022-07-13 MED ORDER — AMLODIPINE BESYLATE 5 MG PO TABS
2.5000 mg | ORAL_TABLET | Freq: Every day | ORAL | Status: DC
  Administered 2022-07-14 – 2022-07-16 (×3): 2.5 mg via ORAL
  Filled 2022-07-13 (×3): qty 1

## 2022-07-13 MED ORDER — ALBUTEROL SULFATE HFA 108 (90 BASE) MCG/ACT IN AERS: 1.0000 | INHALATION_SPRAY | Freq: Four times a day (QID) | RESPIRATORY_TRACT | 1 refills | Status: AC | PRN

## 2022-07-13 MED ORDER — ALBUTEROL SULFATE (2.5 MG/3ML) 0.083% IN NEBU: 2.5000 mg | INHALATION_SOLUTION | Freq: Four times a day (QID) | RESPIRATORY_TRACT | 1 refills | Status: AC | PRN

## 2022-07-13 MED ORDER — LANTUS SOLOSTAR 100 UNIT/ML ~~LOC~~ SOPN: 15.0000 [IU] | PEN_INJECTOR | Freq: Every day | SUBCUTANEOUS | 1 refills | Status: AC

## 2022-07-13 MED ORDER — NIRMATRELVIR/RITONAVIR (PAXLOVID) TABLET (RENAL DOSING)
2.0000 | ORAL_TABLET | Freq: Two times a day (BID) | ORAL | Status: AC
  Administered 2022-07-14 – 2022-07-18 (×9): 2 via ORAL
  Filled 2022-07-13: qty 20

## 2022-07-13 MED ORDER — CEPHALEXIN 250 MG PO CAPS: 250.0000 mg | ORAL_CAPSULE | Freq: Three times a day (TID) | ORAL | 0 refills | Status: DC

## 2022-07-13 MED ORDER — ALPRAZOLAM 0.5 MG PO TABS: 0.5000 mg | ORAL_TABLET | Freq: Every day | ORAL | 0 refills | Status: AC | PRN

## 2022-07-13 MED ORDER — AZITHROMYCIN 500 MG PO TABS: 500.0000 mg | ORAL_TABLET | Freq: Every day | ORAL | 0 refills | Status: DC

## 2022-07-13 MED ORDER — POLYETHYLENE GLYCOL 3350 17 G PO PACK: 17.0000 g | PACK | Freq: Every day | ORAL | 2 refills | Status: AC

## 2022-07-13 MED ORDER — METHOCARBAMOL 500 MG PO TABS: 500.0000 mg | ORAL_TABLET | Freq: Three times a day (TID) | ORAL | 0 refills | Status: AC

## 2022-07-13 MED ORDER — ACETAMINOPHEN 325 MG PO TABS: 650.0000 mg | ORAL_TABLET | Freq: Four times a day (QID) | ORAL | 0 refills | Status: AC | PRN

## 2022-07-13 MED ORDER — GUAIFENESIN ER 600 MG PO TB12: 600.0000 mg | ORAL_TABLET | Freq: Two times a day (BID) | ORAL | 0 refills | Status: DC

## 2022-07-13 MED ORDER — NIRMATRELVIR/RITONAVIR (PAXLOVID)TABLET: 3.0000 | ORAL_TABLET | Freq: Two times a day (BID) | ORAL | Status: DC

## 2022-07-13 NOTE — Discharge Summary (Signed)
Sheena [name], is a 88 y.o. female  DOB June 21, 1924  MRN [id number].  Admission date:  07/10/2022  Admitting Physician  Sheena Hockey, MD  Discharge Date:  07/13/2022   Primary MD  Sheena Meiers, MD  Recommendations for primary care physician for things to follow:   1)Use a walker to ambulate with.  Initially it will be painful to walk.   2)Follow-up with your family doctor or Dr. Arther [name] orthopedic doctor. 3)You will need physical therapy arranged at rehab facility 4) repeat CBC and BMP blood test in 5 to 7 days as advised 5)You need oxygen at home at 3 L via nasal cannula continuously while awake and while asleep--- smoking or having open fires around oxygen can cause fire, significant injury and death  Admission Diagnosis  Acute cystitis without hematuria [N30.00] Fall, initial encounter [W19.XXXA] Closed fracture of multiple pubic rami, left, initial encounter ([place]) [S32.592A] Leukocytosis, unspecified type [D72.829]   Discharge Diagnosis  Acute cystitis without hematuria [N30.00] Fall, initial encounter [W19.XXXA] Closed fracture of multiple pubic rami, left, initial encounter ([place]) [S32.592A] Leukocytosis, unspecified type [D72.829]    Principal Problem:   Closed fracture of multiple pubic rami, left, initial encounter ([place]) Active Problems:   Hypertension   Diabetes mellitus, type 2 ([place])   COPD (chronic obstructive pulmonary disease) ([place])      Past Medical History:  Diagnosis Date   Asthma    treated by Dr. Donneta [name]   Depression    Diabetes mellitus, type 2 ([place])    H/O: hysterectomy 1973   fibroids   Hyperlipidemia    Hypertension    Left arm pain    Lumbago    Melanoma ([place])    left arm, resected, annual CXR's ordered   Syncope 08/2013   Suspected vagal-mediated    Past Surgical History:  Procedure Laterality Date   ABDOMINAL HYSTERECTOMY  1973    fibroid uterus   BREAST BIOPSY  1985   normal   JOINT REPLACEMENT   2003   Left Hip   TOTAL HIP ARTHROPLASTY  2003   left, Dr. Durward [name]   TRANSTHORACIC ECHOCARDIOGRAM  08/23/13   EF 60-65%, borderline concentric LVH, impaired LV diastolic relaxation, LA ULN    HPI  from the history and physical done on the day of admission:   Sheena [name]  is a 88 y.o. female with pmhx relevant for depression/demention, HTN , DM2.Marland Kitchen  COPD presents from [facility] with Lt hip pain after falling -At baseline patient ambulates with a walker, she apparently was going to the bathroom and tripped and fell landing on the left hip also has a skin tear to her left elbow -Apparently no loss of consciousness -No head injury no nausea no vomiting no chest pains and palpitations no syncopal type symptoms - patient is a poor historian overall, so additional history and Code status verified with daughter Sheena [name] [phone]--- -Attempted to reach patient's daughters [name] [phone] unable to reach her, left voicemails  -I  spoke with daughter Sheena [name] [phone]--- -hip x-rays show left-sided pubic rami fractures -CT head and CT C-spine without acute findings -CT chest with bronchiectasis, pulm Sheena [name] and possible interstitial pneumonia, , Left-sided second third and fourth rib fractures  -BNP 58 -UA suggestive of UTI -WBC 20.4 hemoglobin 9.8 which is close to prior baseline platelets 202 -Creatinine 1.1    Hospital Course:    Brief Narrative:   88 y.o. female with pmhx relevant for depression/demention, HTN , DM2.Marland Kitchen  COPD admitted on 07/10/2022 from [facility] with Lt hip pain after mechanical fall and found to have pubic rami fractures   A/p 1)Klebsiella UTI -WBC 20.4 >>14.0 -Generalized weakness and falls -Did Not meet sepsis criteria -Treated with IV Rocephin  -Okay to discharge on p.o. Keflex per urine culture  sensitivity report  2)Acute Left pubic rami fractures----after mechanical fall Outpatient follow-up with Dr. Arther [name] orthopedic surgeon advised -Physical consult appreciated recommends SNF rehab -Pain control appears adequate   3)acute hypoxic respiratory failure/COPD--- related to opiate use for pain control in the setting of underlying COPD and Lt sided rib fractures -Treated with -IV steroids and bronchodilators as ordered -Discharge on p.o. prednisone bronchodilators mucolytics and supplemental oxygen at 3 L via nasal cannula continuously   4)Social/Ethics-documentation reflects DNR status, patient is a poor historian overall Code status verified with daughter Sheena [name] [phone]--- and  HCPOA--is [name] -[phone] -Attempted to reach patient's daughters [name] [phone] unable to reach her, left voicemails  -Family confirms DNR status   5)DM2-A1c is 5.9 reflecting excellent diabetic control PTA -Continue preadmission Insulin regimen   6)HTN-continue amlodipine,   7)Dementia and depression--- continue trazodone as needed, continue Zoloft 75 mg daily and Seroquel 12.5 mg  8)COPD/Rib Fractures/interstitial pneumonia-- -CT chest with bronchiectasis, pulm  [name] and possible interstitial pneumonia, , Left-sided second, third and fourth rib fractures  no acute exacerbation, continue bronchodilators -Incentive spirometry as ordered Treated with Rocephin/azithromycin -Discharge on Keflex and azithromycin   9) Recurrent Falls--- at baseline patient ambulates with a walker -Had a mechanical fall- No head injury no nausea no vomiting no chest pains and palpitations no syncopal type symptoms -CT head and CT C-spine without acute findings PTA pt lived at [facility] and did very poorly, patient has significant limitations with mobility related ADLs- -discharge to SNF, as she is not safe to go back to ALF with her current physcical  limitations    10) chronic anemia- Hgb is down to 7.1 from 9.8--.  Some component of hemodilution - no bleeding concerns  -Repeat CBC 1 week she May transfuse as needed  Dispo: The patient is from: ALF              Anticipated d/c is to: SNF        Code Status :  -  Code Status: DNR    Family Communication: spoke with daughter Sheena [name] [phone]--- I called and updated HCPOA--is [name] -[phone]  Discharge Condition: Stable  Follow UP   Contact information for follow-up providers     Carole Civil, MD. Call in 2 week(s).   Specialties: Orthopedic Surgery, Radiology Contact information: [address] [place] [address] [phone]              Contact information for after-discharge care     [facility] .   Service: Skilled Nursing Contact information: [address] [place]  [address] [phone]                     Diet and Activity recommendation:  As advised  Discharge Instructions    Discharge Instructions     Call MD for:  difficulty breathing, headache or visual disturbances   Complete by: As directed    Call MD for:  persistant dizziness or light-headedness   Complete by: As directed    Call MD for:  persistant nausea and vomiting   Complete by: As directed    Call MD for:  severe uncontrolled pain   Complete by: As directed    Call MD for:  temperature >100.4   Complete by: As directed    Diet - low sodium heart healthy   Complete by: As directed    Diet Carb Modified   Complete by: As directed    Discharge instructions   Complete by: As directed    1)Use a walker to ambulate with.  Initially it will be painful to walk.   2)Follow-up with your family doctor or Dr. Arther [name] orthopedic doctor. 3)You will need physical therapy arranged at rehab facility 4) repeat CBC and BMP blood test in 5 to 7 days as advised--- May need  blood transfusion 5)You need oxygen at home at 3 L via nasal cannula continuously while awake and while asleep--- smoking or having open fires around oxygen can cause fire, significant injury and death   Increase activity slowly   Complete by: As directed        Discharge Medications     Allergies as of 07/13/2022   No Known Allergies      Medication List     STOP taking these medications    levofloxacin 750 MG tablet Commonly known as: Levaquin   loratadine 10 MG tablet Commonly known as: Claritin   ondansetron 4 MG disintegrating tablet Commonly known as: Zofran ODT       TAKE these medications    acetaminophen 325 MG tablet Commonly known as: TYLENOL Take 2 tablets (650 mg total) by mouth every 6 (six) hours as needed for mild pain, headache or fever.   albuterol 108 (90 Base) MCG/ACT inhaler Commonly known as: VENTOLIN HFA Inhale 1 puff into the lungs every 6 (six) hours as needed for wheezing or shortness of breath.   albuterol (2.5 MG/3ML) 0.083% nebulizer solution Commonly known as: PROVENTIL Take 3 mLs (2.5 mg total) by nebulization every 6 (six) hours as needed for wheezing or shortness of breath.   Allergy Relief 25 MG tablet Generic drug: diphenhydrAMINE Take 25 mg by mouth every 8 (eight) hours as needed for allergies or itching.   ALPRAZolam 0.5 MG tablet Commonly known as: XANAX Take 1 tablet (0.5 mg total) by mouth daily as needed for anxiety or sleep. What changed: reasons to take this   amLODipine 5 MG tablet Commonly known as: NORVASC Take 1 tablet (5 mg total) by mouth daily.   atorvastatin 40 MG tablet Commonly known as: LIPITOR TAKE ONE TABLET BY MOUTH EVERY DAY. What changed: when to take this   azithromycin 500 MG tablet Commonly known as: ZITHROMAX Take 1 tablet (500 mg total) by mouth daily for 3 days.   cephALEXin 250 MG capsule Commonly known as: Keflex Take 1 capsule (250 mg total) by mouth 3 (three) times daily for 3  days. For UTI   ferrous sulfate 325 (65 FE) MG EC tablet Take 1 tablet (325 mg total) by mouth daily  with breakfast.   furosemide 20 MG tablet Commonly known as: LASIX Take 20 mg by mouth in the morning.   Geri-Tussin DM 10-100 MG/5ML liquid Generic drug: dextromethorphan-guaiFENesin Take 5 mLs by mouth every 4 (four) hours as needed for cough.   guaiFENesin 600 MG 12 hr tablet Commonly known as: MUCINEX Take 1 tablet (600 mg total) by mouth 2 (two) times daily. What changed:  when to take this reasons to take this   Lantus SoloStar 100 UNIT/ML Solostar Pen Generic drug: insulin glargine Inject 15 Units into the skin at bedtime. What changed:  how much to take when to take this   methocarbamol 500 MG tablet Commonly known as: ROBAXIN Take 1 tablet (500 mg total) by mouth 3 (three) times daily.   oxyCODONE-acetaminophen 5-325 MG tablet Commonly known as: PERCOCET/ROXICET Take 1 pain medicine every 6 hours for pain does not help with Motrin alone   OXYGEN Inhale 3 L/min into the lungs continuous.   polyethylene glycol 17 g packet Commonly known as: MIRALAX / GLYCOLAX Take 17 g by mouth daily. Start taking on: July 14, 2022   predniSONE 20 MG tablet Commonly known as: DELTASONE Take 2 tablets (40 mg total) by mouth daily with breakfast for 5 days.   PRESERVISION AREDS 2 PO Take 1 capsule by mouth 2 (two) times daily.   QUEtiapine 25 MG tablet Commonly known as: SEROQUEL Take 12.5 mg by mouth at bedtime.   senna-docusate 8.6-50 MG tablet Commonly known as: Senokot-S Take 2 tablets by mouth at bedtime.   sertraline 50 MG tablet Commonly known as: ZOLOFT Take 75 mg by mouth daily.   tiotropium 18 MCG inhalation capsule Commonly known as: Spiriva HandiHaler Place 1 capsule (18 mcg total) into inhaler and inhale daily.   triamcinolone cream 0.1 % Commonly known as: KENALOG Apply 1 application topically 2 (two) times daily as needed (rash/itching).        Major procedures and Radiology Reports - PLEASE review detailed and final reports for all details, in brief -   CT Chest Wo Contrast  Result Date: 07/10/2022 CLINICAL DATA:  Pleural effusion, evaluate for malignancy EXAM: CT CHEST WITHOUT CONTRAST TECHNIQUE: Multidetector CT imaging of the chest was performed following the standard protocol without IV contrast. RADIATION DOSE REDUCTION: This exam was performed according to the departmental dose-optimization program which includes automated exposure control, adjustment of the mA and/or kV according to patient size and/or use of iterative reconstruction technique. COMPARISON:  Previous chest radiographs including the examination done on 07/10/2022 and CT chest done on 05/01/2010 FINDINGS: Cardiovascular: Scattered calcifications are seen in thoracic aorta and its major branches. There are small scattered coronary artery calcifications are noted. There is dense calcification in the region of mitral annulus. There is ectasia of main pulmonary artery measuring 3.8 cm suggesting pulmonary arterial hypertension. There is ectasia of ascending thoracic aorta measuring 3.8 cm. Mediastinum/Nodes: There is slight interval increase in size of lymph nodes in mediastinum measuring up to 10 mm in short axis. In the previous CT, lymph nodes were seen in the mediastinum measuring up to 8.6 mm in short axis measurement. There is 1.4 cm smooth marginated fluid density nodule in the posterior right lobe of thyroid. There is increase in size since the previous study. No follow-up is recommended. Lungs/Pleura: There is ectasia of bronchi in both lungs, more so in the lower lung fields with interval worsening. Increased interstitial markings are seen in the periphery of both lungs, more so in the lower  lung fields with interval progression of pulmonary [name]. There are scattered faint ground-glass densities in mid and lower lung fields. There is no focal consolidation. There is  no pleural effusion or pneumothorax. Upper Abdomen: Small hiatal hernia is seen. Scattered calcifications are seen in abdominal aorta. There is 11 mm exophytic low-density lesion in the posterolateral aspect of upper pole of left kidney suggesting possible renal cyst. No follow-up imaging is recommended. Musculoskeletal: Recent undisplaced fractures are seen in the posterior aspects of left second and third ribs. Deformities are noted in the anterior/anterolateral aspect of left second, third and fourth ribs, possibly recent fractures. There is encroachment of neural foramina by bony spurs in the visualized lower cervical spine. Small bony spurs are seen in both shoulders. IMPRESSION: Bronchiectasis. Increased interstitial markings are seen in the periphery of both lungs with interval progression suggesting worsening of pulmonary [name]. There are faint ground-glass densities in the mid and lower lung fields suggesting interstitial lung disease or interstitial pneumonia. There is no focal pulmonary consolidation. There is no pleural effusion. There is slight increase in size of lymph nodes in mediastinum, possibly suggesting reactive hyperplasia. There is ectasia of main pulmonary artery suggesting pulmonary arterial hypertension. Scattered coronary artery calcifications are seen. Aortic atherosclerosis. Recent fractures are seen in left second, third and fourth ribs. There is no pneumothorax. Small hiatal hernia. Other findings as described in the body of the report. Electronically Signed   By: Elmer Picker M.D.   On: 07/10/2022 12:29   CT Cervical Spine Wo Contrast  Result Date: 07/10/2022 CLINICAL DATA:  Neck trauma (Age >= 65y) EXAM: CT CERVICAL SPINE WITHOUT CONTRAST TECHNIQUE: Multidetector CT imaging of the cervical spine was performed without intravenous contrast. Multiplanar CT image reconstructions were also generated. RADIATION DOSE REDUCTION: This exam was performed according to the  departmental dose-optimization program which includes automated exposure control, adjustment of the mA and/or kV according to patient size and/or use of iterative reconstruction technique. COMPARISON:  10/20/2020. FINDINGS: Alignment: Reversed cervical lordosis noted consistent with sequela of degenerative changes versus muscle spasm or positioning. No suggestion of an acute traumatic etiology for this finding. Skull base and vertebrae: No acute fracture. No primary bone lesion or focal pathologic process. Soft tissues and spinal canal: No prevertebral fluid or swelling. No visible canal hematoma. Disc levels: Multilevel disc disease identified with loss of disc spaces and marginal osteophytes extending from C4 through C7. Upper chest: Negative. IMPRESSION: Degenerative changes. There are no acute traumatic abnormalities identified. Electronically Signed   By: Sammie Bench M.D.   On: 07/10/2022 09:30   CT Head Wo Contrast  Result Date: 07/10/2022 CLINICAL DATA:  Head trauma, intracranial arterial injury suspected EXAM: CT HEAD WITHOUT CONTRAST TECHNIQUE: Contiguous axial images were obtained from the base of the skull through the vertex without intravenous contrast. RADIATION DOSE REDUCTION: This exam was performed according to the departmental dose-optimization program which includes automated exposure control, adjustment of the mA and/or kV according to patient size and/or use of iterative reconstruction technique. COMPARISON:  10/20/2020 FINDINGS: Brain: There is periventricular white matter decreased attenuation consistent with small vessel ischemic changes. Ventricles, sulci and cisterns are prominent consistent with age related involutional changes. No acute intracranial hemorrhage, mass effect or shift. No hydrocephalus. Vascular: No hyperdense vessel or unexpected calcification. Skull: Normal. Negative for fracture or focal lesion. Sinuses/Orbits: Mild chronic left maxillary sinusitis. IMPRESSION:  Atrophy and chronic small vessel ischemic changes. No acute intracranial process identified. Electronically Signed   By: Sammie Bench  M.D.   On: 07/10/2022 09:27   DG Hip Unilat W or Wo Pelvis 2-3 Views Left  Result Date: 07/10/2022 CLINICAL DATA:  Fall EXAM: DG HIP (WITH OR WITHOUT PELVIS) 3V LEFT COMPARISON:  10/20/2020. FINDINGS: Osseous structures are osteopenic. Left total hip arthroplasty. Prosthesis appears intact without Periprosthetic lucency. There are acute fractures of the inferior and superior pubic rami on the left displaced by a few mm. The pelvic ring appears otherwise intact. CT may be helpful to assess for additional radiographically occult fractures. IMPRESSION: Acute left pubic rami fractures. Status post left total hip arthroplasty prosthesis appears intact. Electronically Signed   By: Sammie Bench M.D.   On: 07/10/2022 09:22   DG Chest Port 1 View  Result Date: 07/10/2022 CLINICAL DATA:  Fall EXAM: PORTABLE CHEST 1 VIEW COMPARISON:  01/06/2022 FINDINGS: The cardiac silhouette appears prominent. There is pulmonary vascular congestion. There is minimal left basilar consolidation or volume loss. There may be small left-sided pleural effusion. No pneumothorax identified. Aorta is calcified. There are thoracic degenerative changes. IMPRESSION: Minimal left basilar consolidation and possible effusion. Pulmonary vascular congestion. Electronically Signed   By: Sammie Bench M.D.   On: 07/10/2022 09:19    Micro Results   Recent Results (from the past 240 hour(s))  Urine Culture     Status: Abnormal   Collection Time: 07/10/22 11:37 AM   Specimen: Urine, Clean Catch  Result Value Ref Range Status   Specimen Description   Final    URINE, CLEAN CATCH Performed at [facility], [address], [place], [place] [address]    Special Requests   Final    NONE Performed at [facility], [address] [place], [place] [address]    Culture >=100,000 COLONIES/mL KLEBSIELLA  PNEUMONIAE (A)  Final   Report Status 07/13/2022 FINAL  Final   Organism ID, Bacteria KLEBSIELLA PNEUMONIAE (A)  Final      Susceptibility   Klebsiella pneumoniae - MIC*    AMPICILLIN >=32 RESISTANT Resistant     CEFAZOLIN <=4 SENSITIVE Sensitive     CEFEPIME <=0.12 SENSITIVE Sensitive     CEFTRIAXONE <=0.25 SENSITIVE Sensitive     CIPROFLOXACIN <=0.25 SENSITIVE Sensitive     GENTAMICIN <=1 SENSITIVE Sensitive     IMIPENEM <=0.25 SENSITIVE Sensitive     NITROFURANTOIN <=16 SENSITIVE Sensitive     TRIMETH/SULFA <=20 SENSITIVE Sensitive     AMPICILLIN/SULBACTAM 4 SENSITIVE Sensitive     PIP/TAZO <=4 SENSITIVE Sensitive     * >=100,000 COLONIES/mL KLEBSIELLA PNEUMONIAE    Today   Subjective    Sheena [name] today has no new complaints -Respiratory status improving     No fever  Or chills   No Nausea, Vomiting or Diarrhea   no chest pains   Patient has been seen and examined prior to discharge   Objective   Blood pressure (!) 146/55, pulse 86, temperature 99 F (37.2 C), temperature source Oral, resp. rate 20, height '5\' 6"'$  (1.676 m), weight 81 kg, SpO2 92 %.   Intake/Output Summary (Last 24 hours) at 07/13/2022 1437 Last data filed at 07/13/2022 0900 Gross per 24 hour  Intake 1157.43 ml  Output 500 ml  Net 657.43 ml   Exam Gen:- Awake Alert, frail and elderly appearing HEENT:- Young.AT, No sclera icterus, HOH Nose- [place] 2 to 3L/min Neck-Supple Neck,No JVD,.  Lungs-improving air movement, no wheezing CV- S1, S2 normal, regular  Abd-  +ve B.Sounds, Abd Soft, No tenderness,    Extremity - pedal pulses present  Psych-affect is appropriate, oriented x3 Neuro-generalized weakness, no new focal deficits, no tremors -Skin - warm, dry, hemostatic left elbow skin tear MSK-discomfort with range of motion of left lower extremity and hips   Data Review   CBC w Diff:  Lab Results  Component Value Date   WBC 14.0 (H) 07/13/2022   HGB 7.1 (L) 07/13/2022   HGB 12.6 08/13/2014    HCT 22.1 (L) 07/13/2022   HCT 38.5 08/13/2014   PLT 119 (L) 07/13/2022   PLT 246 08/13/2014   LYMPHOPCT 3 07/10/2022   LYMPHOPCT 15.6 02/14/2013   MONOPCT 6 07/10/2022   MONOPCT 14.9 02/14/2013   EOSPCT 0 07/10/2022   EOSPCT 3.4 02/14/2013   BASOPCT 0 07/10/2022   BASOPCT 0.5 02/14/2013    CMP:  Lab Results  Component Value Date   NA 138 07/13/2022   NA 136 08/13/2014   K 3.7 07/13/2022   K 4.2 08/13/2014   CL 106 07/13/2022   CL 101 08/13/2014   CO2 26 07/13/2022   CO2 27 08/13/2014   BUN 25 (H) 07/13/2022   BUN 20 (H) 08/13/2014   CREATININE 0.86 07/13/2022   CREATININE 1.64 (H) 08/13/2014   CREATININE 1.45 (H) 05/12/2013   PROT 8.4 (H) 06/03/2020   PROT 7.6 08/22/2013   ALBUMIN 3.5 06/03/2020   ALBUMIN 3.4 08/22/2013   BILITOT 0.5 06/03/2020   BILITOT 0.3 08/22/2013   ALKPHOS 84 06/03/2020   ALKPHOS 98 08/22/2013   AST 31 06/03/2020   AST 23 08/22/2013   ALT 23 06/03/2020   ALT 18 08/22/2013   Total Discharge time is about 33 minutes  Sheena [name] M.D on 07/13/2022 at 2:37 PM  Go to [email or web address] -  for contact info  [facility] - Office  [phone]

## 2022-07-13 NOTE — Progress Notes (Signed)
Pt was dced to [facility] on 07/13/22  - However COVID test came back positive so she is unable to go to - -SNF requesting isolation for 5 days PTA prior to patient being accepted to rehab - -Start Paxlovid,  -continue steroids and supplemental oxygen - I called and updated patient's HCPOA grandson Delila Pereyra, MD

## 2022-07-13 NOTE — TOC Transition Note (Addendum)
Transition of Care [facility]) - CM/SW Discharge Note   Patient Details  Name: Sheena [name] MRN: [id number] Date of Birth: 21-Feb-1924  Transition of Care [facility]) CM/SW Contact:  Boneta Lucks, RN Phone Number: 07/13/2022, 11:24 AM   Clinical Narrative:   Reviewed bed offers with [name]. He chose [facility], confirmed with Marianna Fuss they have a bed. PASRR updated and number given  [phone] E.  INS Auth updated in [place] [id number] approved. Patient will need a COVID test. RN updated to call report. [name] updated patient will discharge later today pending COVID results.    Addendum : Patient is positive for COVID, per [facility] patient will need to be on isolation for 5 days. [name], grandson updated.    Final next level of care: Skilled Nursing Facility Barriers to Discharge: Barriers Resolved   Patient Goals and CMS Choice Patient states their goals for this hospitalization and ongoing recovery are:: SNF CMS Medicare.gov Compare Post Acute Care list provided to:: Patient Represenative (must comment) Choice offered to / list presented to : [facility] POA / [place]  Discharge Placement               Patient to be transferred to facility by: [facility] staff Name of family member notified: [name] Patient and family notified of of transfer: 07/13/22  Discharge Plan and Services In-house Referral: Clinical Social Work Discharge Planning Services: CM Consult Post Acute Care Choice: [place]            Social Determinants of Health (SDOH) Interventions Housing Interventions: Intervention Not Indicated   Readmission Risk Interventions    07/13/2022   11:23 AM  Readmission Risk Prevention Plan  Transportation Screening Complete  PCP or Specialist Appt within 3-5 Days Complete  HRI or [place] Complete  Social Work Consult for [place] Planning/Counseling Complete  Palliative Care Screening Not Applicable  Medication Review Press photographer) Complete

## 2022-07-14 DIAGNOSIS — S32592A Other specified fracture of left pubis, initial encounter for closed fracture: Secondary | ICD-10-CM | POA: Diagnosis not present

## 2022-07-14 LAB — BASIC METABOLIC PANEL
Anion gap: 6 (ref 5–15)
BUN: 23 mg/dL (ref 8–23)
CO2: 27 mmol/L (ref 22–32)
Calcium: 7.8 mg/dL — ABNORMAL LOW (ref 8.9–10.3)
Chloride: 105 mmol/L (ref 98–111)
Creatinine, Ser: 0.87 mg/dL (ref 0.44–1.00)
GFR, Estimated: >60 mL/min (ref >60)
Glucose, Bld: 84 mg/dL (ref 70–99)
Potassium: 3.8 mmol/L (ref 3.5–5.1)
Sodium: 138 mmol/L (ref 135–145)

## 2022-07-14 LAB — GLUCOSE, CAPILLARY
Glucose-Capillary: 61 mg/dL — ABNORMAL LOW (ref 70–99)
Glucose-Capillary: 147 mg/dL — ABNORMAL HIGH (ref 70–99)
Glucose-Capillary: 159 mg/dL — ABNORMAL HIGH (ref 70–99)
Glucose-Capillary: 149 mg/dL — ABNORMAL HIGH (ref 70–99)

## 2022-07-14 MED ORDER — INSULIN GLARGINE-YFGN 100 UNIT/ML ~~LOC~~ SOLN
16.0000 [IU] | Freq: Every day | SUBCUTANEOUS | Status: DC
  Administered 2022-07-14 – 2022-07-22 (×9): 16 [IU] via SUBCUTANEOUS
  Filled 2022-07-14 (×10): qty 0.16

## 2022-07-14 NOTE — Inpatient Diabetes Management (Signed)
Inpatient Diabetes Program Recommendations  AACE/ADA: New Consensus Statement on Inpatient Glycemic Control (2015)  Target Ranges:  Prepandial:   less than 140 mg/dL      Peak postprandial:   less than 180 mg/dL (1-2 hours)      Critically ill patients:  140 - 180 mg/dL   Lab Results  Component Value Date   GLUCAP 61 (L) 07/14/2022   HGBA1C 5.9 (H) 07/10/2022    Review of Glycemic Control  Latest Reference Range & Units 07/13/22 16:40 07/13/22 21:20 07/14/22 08:22  Glucose-Capillary 70 - 99 mg/dL 167 (H) 209 (H) 61 (L)  (H): Data is abnormally high (L): Data is abnormally low Diabetes history: Type 2 DM Outpatient Diabetes medications: Lantus 15 units BID Current orders for Inpatient glycemic control: Semglee 20 units QHS, Novolog 0-15 units TID & HS, Solumedrol 40 mg QD  Inpatient Diabetes Program Recommendations:    Noted hypoglycemia this AM of 61 mg/dL. Consider decreasing Semglee to 15 units QHS.   Thanks, Bronson Curb, MSN, RNC-OB Diabetes Coordinator [phone] (8a-5p)

## 2022-07-14 NOTE — Progress Notes (Signed)
PROGRESS NOTE     Sheena [name], is a 88 y.o. female, DOB - [birth year], ZOX:[id number]  Admit date - 07/10/2022   Admitting Physician Harbor Paster Denton Brick, MD  Outpatient Primary MD for the patient is [name], [name], MD  LOS - 4  Chief Complaint  Patient presents with   Fall        Brief Narrative:   Brief Narrative:   88 y.o. female with pmhx relevant for depression/demention, HTN , DM2.Marland Kitchen  COPD admitted on 07/10/2022 from [facility] with Lt hip pain after mechanical fall and found to have pubic rami fractures -Discharge to  [facility] SN[facility]-- after 5 days isolation period due to COVID infection (tested positive for COVID 07/13/2022)   A/p 1)Klebsiella UTI -leukocytosis improving-- leukocytosis may persist due to steroids for COVID infection -Generalized weakness and falls -Did Not meet sepsis criteria -Continue IV Rocephin -last dose 07/15/2022   2)Acute Left pubic rami fractures----after mechanical fall -Outpatient follow-up with orthopedic consult -Physical consult appreciated recommends SNF rehab -Pain control appears adequate   3) COVID-19 infection with acute hypoxic respiratory failure--- related to opiate use for pain control in the setting of underlying COPD and Lt sided rib fractures 07/14/22 -=-Unable to wean off O2 at this time -Currently on 2 L of oxygen via nasal cannula -IV steroids and bronchodilators as ordered -Started on Paxlovid on 07/13/22   4)Social/Ethics-documentation reflects DNR status, patient is a poor historian overall Code status verified with daughter [name] [phone]--- -Attempted to reach patient's daughters [name] [phone] unable to reach her, left voicemails  -I spoke with daughter [name] [phone]--- I called and updated HCPOA--is [name] -[phone]   5)DM2- 07/14/22 -episodes of hypoglycemia okay to decrease Lantus to 16 units nightly  Use Novolog/Humalog Sliding  scale insulin with Accu-Cheks/Fingersticks as ordered    6)HTN-continue amlodipine, - May use IV hydralazine as needed elevated BP    7)Dementia and depression--- continue trazodone as needed, continue Zoloft 75 mg daily and Seroquel 12.5 mg  8)COPD/Rib Fractures/interstitial pneumonia-- -CT chest with bronchiectasis, pulm  fibrosis and possible interstitial pneumonia (likely COVID-related),  Left-sided second, third and fourth rib fractures  - continue bronchodilators -Incentive spirometry as ordered -Okay to eat Rocephin/azithromycin as ordered   9) Recurrent Falls--- at baseline patient ambulates with a walker -Had a mechanical fall- No head injury no nausea no vomiting no chest pains and palpitations no syncopal type symptoms -CT head and CT C-spine without acute findings PTA pt lived at [facility] ALF and did very poorly, patient has significant limitations with mobility related ADLs- this patient needs to continue to be monitored in the hospital until a SNF bed is obtained as she is not safe to go home with her current physcical limitations -Physical therapy and Occupational Therapy eval requested   10) chronic anemia- Hgb is down to 7.1 from 9.8 - no bleeding concerns at this time monitor closely -Repeat CBC on 07/15/22---- transfuse if Hgb less than 7    Status is: Inpatient   Remains inpatient appropriate because:    Dispo: The patient is from: ALF              Anticipated d/c is to: SNF              Anticipated d/c date is: 4 days----after 5 days isolation period due to COVID infection (tested positive for COVID 07/13/2022)              Patient currently  is not medically stable to d/c. Barriers: Not Clinically Stable-    Code Status :  -  Code Status: DNR    Family Communication: spoke with daughter [name] [phone]--- I called and updated HCPOA--is [name] -[phone]   DVT Prophylaxis  :   - SCDs   heparin injection 5,000 Units Start: 07/10/22  1800 SCDs Start: 07/10/22 1559 Place TED hose Start: 07/10/22 1559   Lab Results  Component Value Date   PLT 119 (L) 07/13/2022    Inpatient Medications  Scheduled Meds:  amLODipine  2.5 mg Oral Daily   dextromethorphan-guaiFENesin  1 tablet Oral BID   heparin  5,000 Units Subcutaneous Q8H   insulin aspart  0-15 Units Subcutaneous TID WC   insulin aspart  0-5 Units Subcutaneous QHS   insulin glargine-yfgn  16 Units Subcutaneous QHS   methocarbamol  500 mg Oral TID   methylPREDNISolone (SOLU-MEDROL) injection  40 mg Intravenous Q24H   multivitamin with minerals  1 tablet Oral Daily   nirmatrelvir/ritonavir EUA (renal dosing)  2 tablet Oral BID   polyethylene glycol  17 g Oral Daily   QUEtiapine  12.5 mg Oral QHS   senna-docusate  2 tablet Oral QHS   sertraline  75 mg Oral Daily   sodium chloride flush  3 mL Intravenous Q12H   sodium chloride flush  3 mL Intravenous Q12H   Continuous Infusions:  sodium chloride 10 mL/hr at 07/13/22 1422   azithromycin 500 mg (07/13/22 2014)   cefTRIAXone (ROCEPHIN)  IV 1 g (07/13/22 1239)   PRN Meds:.sodium chloride, acetaminophen **OR** acetaminophen, acetaminophen, albuterol, ALPRAZolam, bisacodyl, guaiFENesin-dextromethorphan, hydrALAZINE, ipratropium-albuterol, ondansetron **OR** ondansetron (ZOFRAN) IV, mouth rinse, oxyCODONE, sodium chloride flush, traZODone   Anti-infectives (From admission, onward)    Start     Dose/Rate Route Frequency Ordered Stop   07/13/22 2200  nirmatrelvir/ritonavir EUA (PAXLOVID) 3 tablet  Status:  Discontinued        3 tablet Oral 2 times daily 07/13/22 1541 07/13/22 1550   07/13/22 1700  nirmatrelvir/ritonavir EUA (renal dosing) (PAXLOVID) 2 tablet        2 tablet Oral 2 times daily 07/13/22 1550 07/18/22 2159   07/13/22 0000  cephALEXin (KEFLEX) 250 MG capsule        250 mg Oral 3 times daily 07/13/22 1401 07/16/22 2359   07/13/22 0000  azithromycin (ZITHROMAX) 500 MG tablet        500 mg Oral Daily  07/13/22 1433 07/16/22 2359   07/11/22 1200  cefTRIAXone (ROCEPHIN) 1 g in sodium chloride 0.9 % 100 mL IVPB        1 g 200 mL/hr over 30 Minutes Intravenous Every 24 hours 07/10/22 1555     07/10/22 1930  azithromycin (ZITHROMAX) 500 mg in sodium chloride 0.9 % 250 mL IVPB        500 mg 250 mL/hr over 60 Minutes Intravenous Every 24 hours 07/10/22 1917     07/10/22 1300  cefTRIAXone (ROCEPHIN) 2 g in sodium chloride 0.9 % 100 mL IVPB        2 g 200 mL/hr over 30 Minutes Intravenous  Once 07/10/22 1255 07/10/22 1336        Subjective: Sheena [name] today has no fevers, no emesis,  No chest pain,   -Cough, congestion and hypoxia persist -Oral intake is not great -Episode of hypoglycemia  Objective: Vitals:   07/13/22 0600 07/13/22 1455 07/13/22 2117 07/14/22 0405  BP: (!) 146/55 (!) 141/56 (!) 118/56 136/62  Pulse: 86  88 97 85  Resp: '20  18 18  '$ Temp: 99 F (37.2 C) 98.9 F (37.2 C) 98.9 F (37.2 C) 98.6 F (37 C)  TempSrc: Oral Oral Oral Oral  SpO2: 92% 90% 94% 96%  Weight:      Height:        Intake/Output Summary (Last 24 hours) at 07/14/2022 1156 Last data filed at 07/14/2022 0900 Gross per 24 hour  Intake 800 ml  Output 500 ml  Net 300 ml   Filed Weights   07/10/22 0752 07/10/22 2028  Weight: 81.6 kg 81 kg    Physical Exam  Gen:- Awake Alert, frail and elderly appearing HEENT:- Bell.AT, No sclera icterus, HOH Nose- Levy 2L/min Neck-Supple Neck,No JVD,.  Lungs-improving air movement, no wheezing CV- S1, S2 normal, regular  Abd-  +ve B.Sounds, Abd Soft, No tenderness,    Extremity - pedal pulses present  Psych-affect is appropriate, oriented x3 Neuro-generalized weakness, no new focal deficits, no tremors -Skin - warm, dry, left elbow skin tear MSK-discomfort with range of motion of left lower extremity and hips  Data Reviewed: I have personally reviewed following labs and imaging studies  CBC: Recent Labs  Lab 07/10/22 1000 07/12/22 0517  07/13/22 0858  WBC 20.4* 12.7* 14.0*  NEUTROABS 18.4*  --   --   HGB 9.8* 7.4* 7.1*  HCT 29.9* 23.9* 22.1*  MCV 90.9 91.9 90.9  PLT 202 144* 562*   Basic Metabolic Panel: Recent Labs  Lab 07/10/22 1000 07/12/22 0517 07/13/22 0858 07/14/22 0604  NA 140 137 138 138  K 3.5 4.3 3.7 3.8  CL 101 106 106 105  CO2 '27 27 26 27  '$ GLUCOSE 103* 126* 76 84  BUN 17 31* 25* 23  CREATININE 1.19* 1.10* 0.86 0.87  CALCIUM 8.3* 8.0* 7.7* 7.8*   GFR: Estimated Creatinine Clearance: 40.6 mL/min (by C-G formula based on SCr of 0.87 mg/dL).  Recent Results (from the past 240 hour(s))  Urine Culture     Status: Abnormal   Collection Time: 07/10/22 11:37 AM   Specimen: Urine, Clean Catch  Result Value Ref Range Status   Specimen Description   Final    URINE, CLEAN CATCH Performed at [facility], [address] [place], [place] [address]    Special Requests   Final    NONE Performed at [facility], [address], [place], [place] [address]    Culture >=100,000 COLONIES/mL KLEBSIELLA PNEUMONIAE (A)  Final   Report Status 07/13/2022 FINAL  Final   Organism ID, Bacteria KLEBSIELLA PNEUMONIAE (A)  Final      Susceptibility   Klebsiella pneumoniae - MIC*    AMPICILLIN >=32 RESISTANT Resistant     CEFAZOLIN <=4 SENSITIVE Sensitive     CEFEPIME <=0.12 SENSITIVE Sensitive     CEFTRIAXONE <=0.25 SENSITIVE Sensitive     CIPROFLOXACIN <=0.25 SENSITIVE Sensitive     GENTAMICIN <=1 SENSITIVE Sensitive     IMIPENEM <=0.25 SENSITIVE Sensitive     NITROFURANTOIN <=16 SENSITIVE Sensitive     TRIMETH/SULFA <=20 SENSITIVE Sensitive     AMPICILLIN/SULBACTAM 4 SENSITIVE Sensitive     PIP/TAZO <=4 SENSITIVE Sensitive     * >=100,000 COLONIES/mL KLEBSIELLA PNEUMONIAE  SARS Coronavirus 2 by RT PCR (hospital order, performed in [facility] hospital lab) *cepheid single result test* Anterior Nasal Swab     Status: Abnormal   Collection Time: 07/13/22  1:50 PM   Specimen: Anterior Nasal Swab  Result Value  Ref Range Status   SARS Coronavirus  2 by RT PCR POSITIVE (A) NEGATIVE Final    Comment: (NOTE) SARS-CoV-2 target nucleic acids are DETECTED  SARS-CoV-2 RNA is generally detectable in upper respiratory specimens  during the acute phase of infection.  Positive results are indicative  of the presence of the identified virus, but do not rule out bacterial infection or co-infection with other pathogens not detected by the test.  Clinical correlation with patient history and  other diagnostic information is necessary to determine patient infection status.  The expected result is negative.  Fact Sheet for Patients:   https://www.patel.info/   Fact Sheet for Healthcare Providers:   https://hall.com/    This test is not yet approved or cleared by the [place] FDA and  has been authorized for detection and/or diagnosis of SARS-CoV-2 by FDA under an Emergency Use Authorization (EUA).  This EUA will remain in effect (meaning this test can be used) for the duration of  the COVID-19 declaration under Section 564(b)(1)  of the Act, 21 U.S.C. section 360-bbb-3(b)(1), unless the authorization is terminated or revoked sooner.   Performed at [facility], [address], [place], [place] [address]    Scheduled Meds:  amLODipine  2.5 mg Oral Daily   dextromethorphan-guaiFENesin  1 tablet Oral BID   heparin  5,000 Units Subcutaneous Q8H   insulin aspart  0-15 Units Subcutaneous TID WC   insulin aspart  0-5 Units Subcutaneous QHS   insulin glargine-yfgn  16 Units Subcutaneous QHS   methocarbamol  500 mg Oral TID   methylPREDNISolone (SOLU-MEDROL) injection  40 mg Intravenous Q24H   multivitamin with minerals  1 tablet Oral Daily   nirmatrelvir/ritonavir EUA (renal dosing)  2 tablet Oral BID   polyethylene glycol  17 g Oral Daily   QUEtiapine  12.5 mg Oral QHS   senna-docusate  2 tablet Oral QHS   sertraline  75 mg Oral Daily   sodium chloride  flush  3 mL Intravenous Q12H   sodium chloride flush  3 mL Intravenous Q12H   Continuous Infusions:  sodium chloride 10 mL/hr at 07/13/22 1422   azithromycin 500 mg (07/13/22 2014)   cefTRIAXone (ROCEPHIN)  IV 1 g (07/13/22 1239)    LOS: 4 days   Roxan Hockey M.D on 07/14/2022 at 11:56 AM  Go to [email or web address] - for contact info  [facility] - Office  [phone]  If 7PM-7AM, please contact night-coverage [email or web address] 07/14/2022, 11:56 AM

## 2022-07-14 NOTE — Progress Notes (Signed)
Physical Therapy Treatment Patient Details Name: Sheena [name] MRN: [id number] DOB: September 13, 1923 Today's Date: 07/14/2022   History of Present Illness Sheena [name]  is a 88 y.o. female with pmhx relevant for depression/demention, HTN , DM2.Marland Kitchen  COPD presents from [facility] [place] [facility] with Lt hip pain after falling  -At baseline patient ambulates with a walker, she apparently was going to the bathroom and tripped and fell landing on the left hip also has a skin tear to her left elbow  -Apparently no loss of consciousness  -No head injury no nausea no vomiting no chest pains and palpitations no syncopal type symptoms  - patient is a poor historian overall, so additional history and Code status verified with daughter Sheena [name] [phone]---  -Attempted to reach patient's daughters Ms Sheena [name] Sheena [name] [phone] unable to reach her, left voicemails   -I spoke with daughter Sheena [name] [phone]---  -hip x-rays show left-sided pubic rami fractures  -CT head and CT C-spine without acute findings  -CT chest with bronchiectasis, pulm Neri fibrosis and possible interstitial pneumonia,  , Left-sided second third and fourth rib fractures   -BNP 60  -UA suggestive of UTI  -WBC 20.4 hemoglobin 9.8 which is close to prior baseline platelets 202  -Creatinine 1.1    PT Comments    Patient requires repeated verbal/tactile for participating with therapy, has difficulty sitting up at bedside due to c/o severe pain left hip, required a couple of attempt before able to stand with RW and tolerated taking a few side steps before having to sit due to generalized weakness, fatigue and left hip pain.  Patient tolerated sitting up in chair after therapy - nursing staff notified.  Patient will benefit from continued skilled physical therapy in hospital and recommended venue below to increase strength, balance, endurance for safe ADLs and gait.     Recommendations for follow up therapy are one component of  a multi-disciplinary discharge planning process, led by the attending physician.  Recommendations Sheena be updated based on patient status, additional functional criteria and insurance authorization.  Follow Up Recommendations  Skilled nursing-short term rehab (<3 hours/day) Can patient physically be transported by private vehicle: No   Assistance Recommended at Discharge Intermittent Supervision/Assistance  Patient can return home with the following A lot of help with bathing/dressing/bathroom;A lot of help with walking and/or transfers;Help with stairs or ramp for entrance;Assistance with cooking/housework   Equipment Recommendations  None recommended by PT    Recommendations for Other Services       Precautions / Restrictions Precautions Precautions: Fall Restrictions Weight Bearing Restrictions: No     Mobility  Bed Mobility Overal bed mobility: Needs Assistance Bed Mobility: Supine to Sit     Supine to sit: Max assist     General bed mobility comments: slow labored movement with c/o severe pain left hip    Transfers Overall transfer level: Needs assistance Equipment used: Rolling walker (2 wheels) Transfers: Sit to/from Stand, Bed to chair/wheelchair/BSC Sit to Stand: Mod assist   Step pivot transfers: Mod assist, Max assist       General transfer comment: limited mostly due to severe left hip pain    Ambulation/Gait Ambulation/Gait assistance: Max assist Gait Distance (Feet): 3 Feet Assistive device: Rolling walker (2 wheels) Gait Pattern/deviations: Decreased step length - right, Decreased step length - left, Decreased stance time - left, Decreased stride length, Antalgic, Shuffle Gait velocity: slow     General Gait Details: limited to a few slow labored side  steps due to increased left hip pain and poor standing balance   Stairs             Wheelchair Mobility    Modified Rankin (Stroke Patients Only)       Balance Overall balance  assessment: Needs assistance Sitting-balance support: Feet supported, No upper extremity supported Sitting balance-Leahy Scale: Fair Sitting balance - Comments: fair seated at EOB   Standing balance support: Reliant on assistive device for balance, No upper extremity supported, Bilateral upper extremity supported Standing balance-Leahy Scale: Poor Standing balance comment: using RW                            Cognition Arousal/Alertness: Awake/alert Behavior During Therapy: Anxious Overall Cognitive Status: History of cognitive impairments - at baseline                                          Exercises      General Comments        Pertinent Vitals/Pain Pain Assessment Pain Assessment: Faces Faces Pain Scale: Hurts even more Pain Location: left hip Pain Descriptors / Indicators: Grimacing, Guarding Pain Intervention(s): Limited activity within patient's tolerance, Monitored during session, Premedicated before session, Patient requesting pain meds-RN notified    Home Living                          Prior Function            PT Goals (current goals can now be found in the care plan section) Acute Rehab PT Goals Patient Stated Goal: return home PT Goal Formulation: With patient Time For Goal Achievement: 07/25/22 Potential to Achieve Goals: Good Progress towards PT goals: Progressing toward goals    Frequency    Min 3X/week      PT Plan Current plan remains appropriate    Co-evaluation PT/OT/SLP Co-Evaluation/Treatment: Yes Reason for Co-Treatment: To address functional/ADL transfers PT goals addressed during session: Mobility/safety with mobility;Balance;Proper use of DME        AM-PAC PT "6 Clicks" Mobility   Outcome Measure  Help needed turning from your back to your side while in a flat bed without using bedrails?: A Lot Help needed moving from lying on your back to sitting on the side of a flat bed without  using bedrails?: A Lot Help needed moving to and from a bed to a chair (including a wheelchair)?: A Lot Help needed standing up from a chair using your arms (e.g., wheelchair or bedside chair)?: A Lot Help needed to walk in hospital room?: A Lot Help needed climbing 3-5 steps with a railing? : Total 6 Click Score: 11    End of Session   Activity Tolerance: Patient tolerated treatment well;Patient limited by fatigue;Patient limited by pain Patient left: in chair;with call bell/phone within reach;with chair alarm set Nurse Communication: Mobility status PT Visit Diagnosis: Unsteadiness on feet (R26.81);Other abnormalities of gait and mobility (R26.89);Muscle weakness (generalized) (M62.81)     Time: 3785-8850 PT Time Calculation (min) (ACUTE ONLY): 20 min  Charges:  $Therapeutic Activity: 8-22 mins                     1:46 PM, 07/14/22 Lonell Grandchild, MPT Physical Therapist with [facility] [phone] office [phone] mobile phone

## 2022-07-14 NOTE — Evaluation (Signed)
Occupational Therapy Evaluation Patient Details Name: Sheena [name] MRN: [id number] DOB: 08/17/1923 Today's Date: 07/14/2022   History of Present Illness Sheena [name]  is a 88 y.o. female with pmhx relevant for depression/demention, HTN , DM2.Marland Kitchen  COPD presents from high [facility] assisted living facility with Lt hip pain after falling  -At baseline patient ambulates with a walker, she apparently was going to the bathroom and tripped and fell landing on the left hip also has a skin tear to her left elbow  -Apparently no loss of consciousness  -No head injury no nausea no vomiting no chest pains and palpitations no syncopal type symptoms  - patient is a poor historian overall, so additional history and Code status verified with daughter [name] [phone]---  -Attempted to reach patient's daughters [name] [phone] unable to reach her, left voicemails   -I spoke with daughter [name] [phone]---  -hip x-rays show left-sided pubic rami fractures  -CT head and CT C-spine without acute findings  -CT chest with bronchiectasis, pulm Neri fibrosis and possible interstitial pneumonia,  , Left-sided second third and fourth rib fractures   -BNP 58  -UA suggestive of UTI  -WBC 20.4 hemoglobin 9.8 which is close to prior baseline platelets 202  -Creatinine 1.1   Clinical Impression   Pt agreeable to OT and PT co-evaluation. Pt only verbalizing "no" today during mobility of B LE. Pt required max A for bed mobility and mod to max for stand pivot transfer with RW. Difficult to fully assess B UE strength due to cognitive deficits. Limited functional use noted with deficits and sings of pain in L UE with P/ROM. Pt was left in the chair with call bell within reach and chair alarm set. Pt will benefit from continued OT in the hospital and recommended venue below to increase strength, balance, and endurance for safe ADL's.        Recommendations for follow up therapy are one component of a  multi-disciplinary discharge planning process, led by the attending physician.  Recommendations may be updated based on patient status, additional functional criteria and insurance authorization.   Follow Up Recommendations  Skilled nursing-short term rehab (<3 hours/day)     Assistance Recommended at Discharge Frequent or constant Supervision/Assistance  Patient can return home with the following A lot of help with walking and/or transfers;A lot of help with bathing/dressing/bathroom;Assistance with cooking/housework;Assistance with feeding;Direct supervision/assist for medications management;Assist for transportation;Help with stairs or ramp for entrance    Functional Status Assessment  Patient has had a recent decline in their functional status and demonstrates the ability to make significant improvements in function in a reasonable and predictable amount of time.  Equipment Recommendations  None recommended by OT    Recommendations for Other Services       Precautions / Restrictions Precautions Precautions: Fall Restrictions Weight Bearing Restrictions: No      Mobility Bed Mobility Overal bed mobility: Needs Assistance Bed Mobility: Supine to Sit     Supine to sit: Max assist     General bed mobility comments: labored movement; increase in pain with movement of B LE.    Transfers Overall transfer level: Needs assistance Equipment used: Rolling walker (2 wheels) Transfers: Sit to/from Stand, Bed to chair/wheelchair/BSC Sit to Stand: Mod assist Stand pivot transfers: Mod assist, Max assist         General transfer comment: Unsteady with poor weight shift. Unable to take full step to L with L LE.  Balance Overall balance assessment: Needs assistance Sitting-balance support: Feet supported, Bilateral upper extremity supported Sitting balance-Leahy Scale: Fair Sitting balance - Comments: fair seated at EOB   Standing balance support: During functional  activity, Bilateral upper extremity supported, Reliant on assistive device for balance Standing balance-Leahy Scale: Poor Standing balance comment: using RW                           ADL either performed or assessed with clinical judgement   ADL Overall ADL's : Needs assistance/impaired Eating/Feeding: Moderate assistance;Maximal assistance;Sitting   Grooming: Moderate assistance;Maximal assistance;Sitting   Upper Body Bathing: Moderate assistance;Maximal assistance;Sitting   Lower Body Bathing: Maximal assistance;Total assistance;Bed level   Upper Body Dressing : Moderate assistance;Maximal assistance;Sitting   Lower Body Dressing: Maximal assistance;Total assistance;Bed level   Toilet Transfer: Moderate assistance;Maximal assistance;Rolling walker (2 wheels);Stand-pivot   Toileting- Water quality scientist and Hygiene: Total assistance;Bed level         General ADL Comments: Pt very weak with limited active engagement in ADL tasks.     Vision Baseline Vision/History:  (Unsure) Ability to See in Adequate Light:  (Unsure) Patient Visual Report: Other (comment) (Unsure. Pt unable to report.) Vision Assessment?: Vision impaired- to be further tested in functional context (Difficult to assess due to pt cognitive deficits.)                Pertinent Vitals/Pain Pain Assessment Pain Assessment: Faces Faces Pain Scale: Hurts even more Pain Location: left hip Pain Descriptors / Indicators: Grimacing, Guarding Pain Intervention(s): Limited activity within patient's tolerance, Monitored during session, Repositioned     Hand Dominance  (unsure)   Extremity/Trunk Assessment Upper Extremity Assessment Upper Extremity Assessment: RUE deficits/detail;LUE deficits/detail RUE Deficits / Details: Difficulty to assess due to cognitive deficits, but pt demonstrates [facility] P/ROM and ability to use UE functionally with RW. LUE Deficits / Details: Difficulty to assess due to  cognitive deficits, but pt demonstrates ~75% of available shoulder flexion P/ROM and ability to use UE functionally with RW.   Lower Extremity Assessment Lower Extremity Assessment: Defer to PT evaluation   Cervical / Trunk Assessment Cervical / Trunk Assessment: Kyphotic   Communication Communication Communication: Other (comment) (Mostly non-verbal. Able to verbalize "no.")   Cognition Arousal/Alertness: Awake/alert Behavior During Therapy: Anxious Overall Cognitive Status: History of cognitive impairments - at baseline                                                        Home Living Family/patient expects to be discharged to:: Assisted living                             Home Equipment: Rolling Walker (2 wheels);Rollator (4 wheels)   Additional Comments: Patient is poor historian      Prior Functioning/Environment Prior Level of Function : Needs assist       Physical Assist : Mobility (physical);ADLs (physical) Mobility (physical): Bed mobility;Transfers;Gait;Stairs   Mobility Comments: Supervised household ambulator using RW ADLs Comments: assisted by ALF staff. Pt unable to report fully on level of assist.        OT Problem List: Decreased range of motion;Decreased strength;Decreased activity tolerance;Impaired balance (sitting and/or standing);Impaired UE functional use      OT Treatment/Interventions:  Self-care/ADL training;Therapeutic exercise;Therapeutic activities;Patient/family education;Balance training    OT Goals(Current goals can be found in the care plan section) Acute Rehab OT Goals Patient Stated Goal: none stated OT Goal Formulation: Patient unable to participate in goal setting Time For Goal Achievement: 07/28/22 Potential to Achieve Goals: Fair  OT Frequency: Min 2X/week    Co-evaluation PT/OT/SLP Co-Evaluation/Treatment: Yes Reason for Co-Treatment: To address functional/ADL transfers   OT goals  addressed during session: ADL's and self-care                       End of Session Equipment Utilized During Treatment: Rolling walker (2 wheels);Oxygen Nurse Communication: Other (comment) (Notified pt was in chair.)  Activity Tolerance: Patient tolerated treatment well Patient left: in chair;with call bell/phone within reach;with chair alarm set  OT Visit Diagnosis: Unsteadiness on feet (R26.81);Other abnormalities of gait and mobility (R26.89);Muscle weakness (generalized) (M62.81);History of falling (Z91.81)                Time: 5638-7564 OT Time Calculation (min): 15 min Charges:  OT General Charges $OT Visit: 1 Visit OT Evaluation $OT Eval Low Complexity: 1 Low  Genesi Stefanko OT, MOT  Larey Seat 07/14/2022, 10:01 AM

## 2022-07-14 NOTE — Plan of Care (Signed)
  Problem: Acute Rehab OT Goals (only OT should resolve) Goal: Pt. Will Perform Eating Flowsheets (Taken 07/14/2022 1004) Pt Will Perform Eating:  with min guard assist  sitting Goal: Pt. Will Perform Grooming Flowsheets (Taken 07/14/2022 1004) Pt Will Perform Grooming:  with min assist  sitting Goal: Pt. Will Perform Upper Body Bathing Flowsheets (Taken 07/14/2022 1004) Pt Will Perform Upper Body Bathing:  with min assist  with min guard assist  sitting Goal: Pt. Will Perform Upper Body Dressing Flowsheets (Taken 07/14/2022 1004) Pt Will Perform Upper Body Dressing:  with min guard assist  with min assist  sitting Goal: Pt. Will Perform Lower Body Dressing Flowsheets (Taken 07/14/2022 1004) Pt Will Perform Lower Body Dressing:  with mod assist  sitting/lateral leans Goal: Pt. Will Transfer To Toilet Flowsheets (Taken 07/14/2022 1004) Pt Will Transfer to Toilet:  with min assist  with min guard assist  stand pivot transfer Goal: Pt/Caregiver Will Perform Home Exercise Program Flowsheets (Taken 07/14/2022 1004) Pt/caregiver will Perform Home Exercise Program:  Increased ROM  Increased strength  Right Upper extremity  Left upper extremity  With Supervision  Vittorio Mohs OT, MOT

## 2022-07-15 DIAGNOSIS — S32592A Other specified fracture of left pubis, initial encounter for closed fracture: Secondary | ICD-10-CM | POA: Diagnosis not present

## 2022-07-15 LAB — GLUCOSE, CAPILLARY
Glucose-Capillary: 190 mg/dL — ABNORMAL HIGH (ref 70–99)
Glucose-Capillary: 209 mg/dL — ABNORMAL HIGH (ref 70–99)

## 2022-07-15 LAB — CBC
HCT: 21.6 % — ABNORMAL LOW (ref 36.0–46.0)
Hemoglobin: 7 g/dL — ABNORMAL LOW (ref 12.0–15.0)
MCH: 28.9 pg (ref 26.0–34.0)
MCHC: 32.4 g/dL (ref 30.0–36.0)
MCV: 89.3 fL (ref 80.0–100.0)
Platelets: 115 10*3/uL — ABNORMAL LOW (ref 150–400)
RBC: 2.42 MIL/uL — ABNORMAL LOW (ref 3.87–5.11)
RDW: 15.8 % — ABNORMAL HIGH (ref 11.5–15.5)
WBC: 9.6 10*3/uL (ref 4.0–10.5)
nRBC: 0 % (ref 0.0–0.2)

## 2022-07-15 MED ORDER — SODIUM CHLORIDE 0.9 % IV SOLN
1.0000 g | Freq: Once | INTRAVENOUS | Status: AC
  Administered 2022-07-15: 1 g via INTRAVENOUS
  Filled 2022-07-15: qty 10

## 2022-07-15 NOTE — Progress Notes (Signed)
PROGRESS NOTE     Sheena [name], is a 88 y.o. female, DOB - [birth year], JSH:[id number]  Admit date - 07/10/2022   Admitting Physician Courage Denton Brick, MD  Outpatient Primary MD for the patient is [name], [name], MD  LOS - 5  Chief Complaint  Patient presents with   Fall        Brief Narrative:   Brief Narrative:   88 y.o. female with pmhx relevant for depression/demention, HTN , DM2.Sheena [name]  COPD admitted on 07/10/2022 from high [place] assisted living facility with Lt hip pain after mechanical fall and found to have pubic rami fractures -Discharge to  [facility] SNF-- after 5 days isolation period due to COVID infection (tested positive for COVID 07/13/2022)   A/p  Klebsiella UTI -leukocytosis improving-- leukocytosis may persist due to steroids for COVID infection -Generalized weakness and falls -Did Not meet sepsis criteria -treated with  IV Rocephin -last dose 07/15/2022   Acute Left pubic rami fractures----after mechanical fall -Outpatient follow-up with orthopedic consult -Physical consult appreciated recommends SNF rehab -Pain control appears adequate    COVID-19 infection with acute on chronic hypoxic respiratory failure--- related to opiate use for pain control in the setting of underlying COPD and Lt sided rib fractures -=reports on home o2 for a few years  -Currently on 2 L of oxygen via nasal cannula -IV steroids and bronchodilators as ordered -Started on Paxlovid on 07/13/22     IDDM2- -episodes of hypoglycemia , insulin dose decrease, continue to adjust insulin dose as needed -currently on steroids, plan to wean steroids    HTN- bp stable on current regimen ,continue amlodipine,   chronic anemia- Hgb is down to 7.1 from 9.8 - no bleeding concerns at this time monitor closely -Repeat CBC on 07/15/22---- transfuse if Hgb less than 7  Dementia and depression--- continue trazodone as needed, continue Zoloft 75 mg daily and Seroquel 12.5 mg No agitation,  no oriented to time ,but to place and person      Recurrent Falls--- at baseline patient ambulates with a walker from ALF -Had a mechanical fall- No head injury no nausea no vomiting no chest pains and palpitations no syncopal type symptoms -CT head and CT C-spine without acute findings PTA pt lived at [facility] and did very poorly, patient has significant limitations with mobility related ADLs- this patient needs to continue to be monitored in the hospital until a SNF bed is obtained as she is not safe to go home with her current physcical limitations -daughter desires palliative care to follow patient at snf     Dispo: The patient is from: ALF              Anticipated d/c is to: SNF              Anticipated d/c date is: on 12/9 -after 5 days isolation period due to COVID infection (tested positive for COVID 07/13/2022)                  Code Status :  -  Code Status: DNR , verified with family   Family Communication: spoke with daughter Sheena [name] [phone] on 12/6 HCPOA--is Sheena [name] -[phone], called pm 12/6, no answer   DVT Prophylaxis  :   - SCDs   heparin injection 5,000 Units Start: 07/10/22 1800 SCDs Start: 07/10/22 1559 Place TED hose Start: 07/10/22 1559   Lab Results  Component Value Date   PLT 115 (L) 07/15/2022    Inpatient Medications  Scheduled Meds:  amLODipine  2.5 mg Oral Daily   dextromethorphan-guaiFENesin  1 tablet Oral BID   heparin  5,000 Units Subcutaneous Q8H   insulin aspart  0-15 Units Subcutaneous TID WC   insulin aspart  0-5 Units Subcutaneous QHS   insulin glargine-yfgn  16 Units Subcutaneous QHS   methocarbamol  500 mg Oral TID   methylPREDNISolone (SOLU-MEDROL) injection  40 mg Intravenous Q24H   multivitamin with minerals  1 tablet Oral Daily   nirmatrelvir/ritonavir EUA (renal dosing)  2 tablet Oral BID   polyethylene glycol  17 g Oral Daily   QUEtiapine  12.5 mg Oral QHS   senna-docusate  2 tablet Oral QHS    sertraline  75 mg Oral Daily   sodium chloride flush  3 mL Intravenous Q12H   sodium chloride flush  3 mL Intravenous Q12H   Continuous Infusions:  sodium chloride Stopped (07/13/22 2148)   cefTRIAXone (ROCEPHIN)  IV Stopped (07/14/22 1355)   PRN Meds:.sodium chloride, acetaminophen **OR** acetaminophen, acetaminophen, albuterol, ALPRAZolam, bisacodyl, guaiFENesin-dextromethorphan, hydrALAZINE, ipratropium-albuterol, ondansetron **OR** ondansetron (ZOFRAN) IV, mouth rinse, oxyCODONE, sodium chloride flush, traZODone   Anti-infectives (From admission, onward)    Start     Dose/Rate Route Frequency Ordered Stop   07/15/22 1430  cefTRIAXone (ROCEPHIN) 1 g in sodium chloride 0.9 % 100 mL IVPB        1 g 200 mL/hr over 30 Minutes Intravenous  Once 07/15/22 1342 07/15/22 1428   07/13/22 2200  nirmatrelvir/ritonavir EUA (PAXLOVID) 3 tablet  Status:  Discontinued        3 tablet Oral 2 times daily 07/13/22 1541 07/13/22 1550   07/13/22 1700  nirmatrelvir/ritonavir EUA (renal dosing) (PAXLOVID) 2 tablet        2 tablet Oral 2 times daily 07/13/22 1550 07/18/22 2159   07/13/22 0000  cephALEXin (KEFLEX) 250 MG capsule        250 mg Oral 3 times daily 07/13/22 1401 07/16/22 2359   07/13/22 0000  azithromycin (ZITHROMAX) 500 MG tablet        500 mg Oral Daily 07/13/22 1433 07/16/22 2359   07/11/22 1200  cefTRIAXone (ROCEPHIN) 1 g in sodium chloride 0.9 % 100 mL IVPB        1 g 200 mL/hr over 30 Minutes Intravenous Every 24 hours 07/10/22 1555 07/15/22 2359   07/10/22 1930  azithromycin (ZITHROMAX) 500 mg in sodium chloride 0.9 % 250 mL IVPB        500 mg 250 mL/hr over 60 Minutes Intravenous Every 24 hours 07/10/22 1917 07/14/22 1845   07/10/22 1300  cefTRIAXone (ROCEPHIN) 2 g in sodium chloride 0.9 % 100 mL IVPB        2 g 200 mL/hr over 30 Minutes Intravenous  Once 07/10/22 1255 07/10/22 1336        Subjective:  [name] today has no fevers, no emesis,  No chest pain,   -I did not  hear her cough today during [place]ountner -Oral intake is not great   Objective: Vitals:   07/14/22 0405 07/14/22 1500 07/14/22 2147 07/15/22 0437  BP: 136/62 (!) 144/62 100/86 (!) 123/49  Pulse: 85 80 80 73  Resp: 18 (!) '24 18 16  '$ Temp: 98.6 F (37 C) 98.9 F (37.2 C) 98.6 F (37 C) 97.8 F (36.6 C)  TempSrc: Oral Oral    SpO2: 96% 98% 95% 93%  Weight:      Height:        Intake/Output Summary (Last 24 hours)  at 07/15/2022 1803 Last data filed at 07/15/2022 1621 Gross per 24 hour  Intake 544.89 ml  Output 400 ml  Net 144.89 ml   Filed Weights   07/10/22 0752 07/10/22 2028  Weight: 81.6 kg 81 kg    Physical Exam  Gen:- Awake Alert, frail and elderly, chronically ill-appearing, hard of hearing  HEENT:- [place].AT, No sclera icterus, HOH Nose- [place] 2L/min Neck-Supple Neck,No JVD,.  Lungs-improving air movement, no wheezing CV- S1, S2 normal, regular  Abd-  +ve B.Sounds, Abd Soft, No tenderness,    Extremity - pedal pulses present  Psych-affect is appropriate, oriented x2, not to time Neuro-generalized weakness, no new focal deficits, no tremors -Skin - warm, dry, left elbow skin tear MSK-discomfort with range of motion of left lower extremity and hips  Data Reviewed: I have personally reviewed following labs and imaging studies  CBC: Recent Labs  Lab 07/10/22 1000 07/12/22 0517 07/13/22 0858 07/15/22 0713  WBC 20.4* 12.7* 14.0* 9.6  NEUTROABS 18.4*  --   --   --   HGB 9.8* 7.4* 7.1* 7.0*  HCT 29.9* 23.9* 22.1* 21.6*  MCV 90.9 91.9 90.9 89.3  PLT 202 144* 119* 768*   Basic Metabolic Panel: Recent Labs  Lab 07/10/22 1000 07/12/22 0517 07/13/22 0858 07/14/22 0604  NA 140 137 138 138  K 3.5 4.3 3.7 3.8  CL 101 106 106 105  CO2 '27 27 26 27  '$ GLUCOSE 103* 126* 76 84  BUN 17 31* 25* 23  CREATININE 1.19* 1.10* 0.86 0.87  CALCIUM 8.3* 8.0* 7.7* 7.8*   GFR: Estimated Creatinine Clearance: 40.6 mL/min (by C-G formula based on SCr of 0.87 mg/dL).  Recent  Results (from the past 240 hour(s))  Urine Culture     Status: Abnormal   Collection Time: 07/10/22 11:37 AM   Specimen: Urine, Clean Catch  Result Value Ref Range Status   Specimen Description   Final    URINE, CLEAN CATCH Performed at [facility], [address], [place], [place] [address]    Special Requests   Final    NONE Performed at [facility], [address] [place], [place] [address]    Culture >=100,000 COLONIES/mL KLEBSIELLA PNEUMONIAE (A)  Final   Report Status 07/13/2022 FINAL  Final   Organism ID, Bacteria KLEBSIELLA PNEUMONIAE (A)  Final      Susceptibility   Klebsiella pneumoniae - MIC*    AMPICILLIN >=32 RESISTANT Resistant     CEFAZOLIN <=4 SENSITIVE Sensitive     CEFEPIME <=0.12 SENSITIVE Sensitive     CEFTRIAXONE <=0.25 SENSITIVE Sensitive     CIPROFLOXACIN <=0.25 SENSITIVE Sensitive     GENTAMICIN <=1 SENSITIVE Sensitive     IMIPENEM <=0.25 SENSITIVE Sensitive     NITROFURANTOIN <=16 SENSITIVE Sensitive     TRIMETH/SULFA <=20 SENSITIVE Sensitive     AMPICILLIN/SULBACTAM 4 SENSITIVE Sensitive     PIP/TAZO <=4 SENSITIVE Sensitive     * >=100,000 COLONIES/mL KLEBSIELLA PNEUMONIAE  SARS Coronavirus 2 by RT PCR (hospital order, performed in [facility] hospital lab) *cepheid single result test* Anterior Nasal Swab     Status: Abnormal   Collection Time: 07/13/22  1:50 PM   Specimen: Anterior Nasal Swab  Result Value Ref Range Status   SARS Coronavirus 2 by RT PCR POSITIVE (A) NEGATIVE Final    Comment: (NOTE) SARS-CoV-2 target nucleic acids are DETECTED  SARS-CoV-2 RNA is generally detectable in upper respiratory specimens  during the acute phase of infection.  Positive results are indicative  of the presence of the identified virus, but do not rule out bacterial infection or co-infection with other pathogens not detected by the test.  Clinical correlation with patient history and  other diagnostic information is necessary to determine patient infection  status.  The expected result is negative.  Fact Sheet for Patients:   https://www.patel.info/   Fact Sheet for Healthcare Providers:   https://hall.com/    This test is not yet approved or cleared by the [place] FDA and  has been authorized for detection and/or diagnosis of SARS-CoV-2 by FDA under an Emergency Use Authorization (EUA).  This EUA will remain in effect (meaning this test can be used) for the duration of  the COVID-19 declaration under Section 564(b)(1)  of the Act, 21 U.S.C. section 360-bbb-3(b)(1), unless the authorization is terminated or revoked sooner.   Performed at [facility], [address], [place], [place] [address]    Scheduled Meds:  amLODipine  2.5 mg Oral Daily   dextromethorphan-guaiFENesin  1 tablet Oral BID   heparin  5,000 Units Subcutaneous Q8H   insulin aspart  0-15 Units Subcutaneous TID WC   insulin aspart  0-5 Units Subcutaneous QHS   insulin glargine-yfgn  16 Units Subcutaneous QHS   methocarbamol  500 mg Oral TID   methylPREDNISolone (SOLU-MEDROL) injection  40 mg Intravenous Q24H   multivitamin with minerals  1 tablet Oral Daily   nirmatrelvir/ritonavir EUA (renal dosing)  2 tablet Oral BID   polyethylene glycol  17 g Oral Daily   QUEtiapine  12.5 mg Oral QHS   senna-docusate  2 tablet Oral QHS   sertraline  75 mg Oral Daily   sodium chloride flush  3 mL Intravenous Q12H   sodium chloride flush  3 mL Intravenous Q12H   Continuous Infusions:  sodium chloride Stopped (07/13/22 2148)   cefTRIAXone (ROCEPHIN)  IV Stopped (07/14/22 1355)    LOS: 5 days   Florencia Reasons M.D PhD FACP on 07/15/2022 at 6:03 PM  Go to [email or web address] - for contact info  [facility] - Office  [phone]  If 7PM-7AM, please contact night-coverage [email or web address] 07/15/2022, 6:03 PM

## 2022-07-15 NOTE — TOC Progression Note (Signed)
Transition of Care [facility]) - Progression Note    Patient Details  Name: Sheena [name] MRN: [id number] Date of Birth: 06-Mar-1924  Transition of Care [facility]) CM/SW [place], [place] Phone Number: 07/15/2022, 12:04 PM  Clinical Narrative:    CSW spoke to [facility] in admissions at [facility] to confirm what day they will be able to accept pt. Marianna Fuss states she will speak with DON to confirm and update CSW when able. TOC to start insurance auth again once there is a known date for D/C. TOC to follow.   Expected Discharge Plan: Skilled Nursing Facility Barriers to Discharge: Barriers Resolved  Expected Discharge Plan and Services Expected Discharge Plan: [place] In-house Referral: Clinical Social Work Discharge Planning Services: CM Consult Post Acute Care Choice: [place] Living arrangements for the past 2 months: [place] Expected Discharge Date: 07/13/22                                     Social Determinants of Health (SDOH) Interventions Housing Interventions: Intervention Not Indicated  Readmission Risk Interventions    07/13/2022   11:23 AM  Readmission Risk Prevention Plan  Transportation Screening Complete  PCP or Specialist Appt within 3-5 Days Complete  HRI or [place] Complete  Social Work Consult for [place] Planning/Counseling Complete  Palliative Care Screening Not Applicable  Medication Review Press photographer) Complete

## 2022-07-16 ENCOUNTER — Inpatient Hospital Stay (HOSPITAL_COMMUNITY): Payer: Medicare Other

## 2022-07-16 DIAGNOSIS — S32592A Other specified fracture of left pubis, initial encounter for closed fracture: Secondary | ICD-10-CM | POA: Diagnosis not present

## 2022-07-16 LAB — BASIC METABOLIC PANEL
Anion gap: 8 (ref 5–15)
BUN: 38 mg/dL — ABNORMAL HIGH (ref 8–23)
CO2: 24 mmol/L (ref 22–32)
Calcium: 7.6 mg/dL — ABNORMAL LOW (ref 8.9–10.3)
Chloride: 102 mmol/L (ref 98–111)
Creatinine, Ser: 1.17 mg/dL — ABNORMAL HIGH (ref 0.44–1.00)
GFR, Estimated: 43 mL/min — ABNORMAL LOW (ref >60)
Glucose, Bld: 197 mg/dL — ABNORMAL HIGH (ref 70–99)
Potassium: 4.1 mmol/L (ref 3.5–5.1)
Sodium: 134 mmol/L — ABNORMAL LOW (ref 135–145)

## 2022-07-16 LAB — CBC
HCT: 23.5 % — ABNORMAL LOW (ref 36.0–46.0)
Hemoglobin: 7.6 g/dL — ABNORMAL LOW (ref 12.0–15.0)
MCH: 29 pg (ref 26.0–34.0)
MCHC: 32.3 g/dL (ref 30.0–36.0)
MCV: 89.7 fL (ref 80.0–100.0)
Platelets: 137 10*3/uL — ABNORMAL LOW (ref 150–400)
RBC: 2.62 MIL/uL — ABNORMAL LOW (ref 3.87–5.11)
RDW: 16.4 % — ABNORMAL HIGH (ref 11.5–15.5)
WBC: 14.8 10*3/uL — ABNORMAL HIGH (ref 4.0–10.5)
nRBC: 0.3 % — ABNORMAL HIGH (ref 0.0–0.2)

## 2022-07-16 LAB — GLUCOSE, CAPILLARY
Glucose-Capillary: 171 mg/dL — ABNORMAL HIGH (ref 70–99)
Glucose-Capillary: 171 mg/dL — ABNORMAL HIGH (ref 70–99)
Glucose-Capillary: 155 mg/dL — ABNORMAL HIGH (ref 70–99)
Glucose-Capillary: 171 mg/dL — ABNORMAL HIGH (ref 70–99)

## 2022-07-16 LAB — MAGNESIUM: Magnesium: 2.3 mg/dL (ref 1.7–2.4)

## 2022-07-16 LAB — C-REACTIVE PROTEIN: CRP: 16.8 mg/dL — ABNORMAL HIGH (ref <1.0)

## 2022-07-16 MED ORDER — FUROSEMIDE 10 MG/ML IJ SOLN
40.0000 mg | Freq: Once | INTRAMUSCULAR | Status: AC
  Administered 2022-07-16: 40 mg via INTRAVENOUS
  Filled 2022-07-16: qty 4

## 2022-07-16 MED ORDER — SODIUM CHLORIDE 0.9 % IV SOLN
1.0000 g | INTRAVENOUS | Status: DC
  Administered 2022-07-16: 1 g via INTRAVENOUS
  Filled 2022-07-16: qty 10

## 2022-07-16 NOTE — Progress Notes (Signed)
PT Cancellation Note  Patient Details Name: Sheena [name] MRN: [id number] DOB: 05-29-1924   Cancelled Treatment:    Reason Eval/Treat Not Completed: Patient declined, no reason specified. Upon therapist entry, patient fast asleep, struggle to arouse patient.  Once aroused and educated on PT and purpose of treatment, patient just shook her head and mumbled no.  Patient then fell back asleep, when tried to arouse again to encourage patient to per dissipate in treatment, patient is shook her head indicating no.  Last attempt was performed and patient did not even arouse to therapist attempt.  Patient was educated that PT will return tomorrow to attempt treatment.   Wonda Olds 07/16/2022, 3:47 PM

## 2022-07-16 NOTE — TOC Progression Note (Signed)
Transition of Care [facility]) - Progression Note    Patient Details  Name: Sheena [name] MRN: [id number] Date of Birth: April 12, 1924  Transition of Care [facility]) CM/SW [place], [place] Phone Number: 07/16/2022, 12:04 PM  Clinical Narrative:    CSW updated by Marianna Fuss with [facility] that they will be able to accept pt to SNF at their facility on Sunday. TOC to start insurance auth. TOC to follow.   Expected Discharge Plan: Skilled Nursing Facility Barriers to Discharge: SNF Covid (awaiting completion of isolation)  Expected Discharge Plan and Services Expected Discharge Plan: [place] In-house Referral: Clinical Social Work Discharge Planning Services: CM Consult Post Acute Care Choice: [place] arrangements for the past 2 months: [place] Expected Discharge Date: 07/13/22                                     Social Determinants of Health (SDOH) Interventions Housing Interventions: Intervention Not Indicated  Readmission Risk Interventions    07/13/2022   11:23 AM  Readmission Risk Prevention Plan  Transportation Screening Complete  PCP or Specialist Appt within 3-5 Days Complete  HRI or [place] Complete  Social Work Consult for [place] Planning/Counseling Complete  Palliative Care Screening Not Applicable  Medication Review [employer]) Complete

## 2022-07-16 NOTE — Progress Notes (Signed)
PROGRESS NOTE     Sheena [name], is a 88 y.o. female, DOB - 1924/03/23, SKA:[id number]  Admit date - 07/10/2022   Admitting Physician Courage Denton Brick, MD  Outpatient Primary MD for the patient is [name], [name], MD  LOS - 6  Chief Complaint  Patient presents with   Fall        Brief Narrative:   Brief Narrative:   88 y.o. female with pmhx relevant for depression/demention, HTN , DM2.Marland Kitchen  COPD admitted on 07/10/2022 from [facility] [place] assisted living facility with Lt hip pain after mechanical fall and found to have pubic rami fractures -Discharge to  [facility] SNF-- after 5 days isolation period due to COVID infection (tested positive for COVID 07/13/2022)   A/p  Klebsiella UTI -treated with  IV Rocephin -last dose 07/15/2022    Acute Left pubic rami fractures----after mechanical fall -Outpatient follow-up with orthopedic consult -Physical consult appreciated recommends SNF rehab -Pain control appears adequate   Lt sided rib fractures from the fall   COVID-19 infection with acute on chronic hypoxic respiratory failure-- -=reports on home o2 at 2liters for a few years  --IV steroids and bronchodilators as ordered, -Started on Paxlovid on 07/13/22 -increased oxygen requirement on 12/7, repeat cxr showed worsening of pneumonia, lasix x1, started back on rocephin, continue steroids, paxlovid -discussed with daughter may consider comfort measures if she does not improve     IDDM2- -episodes of hypoglycemia , insulin dose decrease, continue to adjust insulin dose as needed -currently on steroids, plan to wean steroids    HTN- bp stable    chronic anemia- Hgb is down to 7.1 from 9.8 - no bleeding concerns at this time monitor closely -Repeat CBC on 07/15/22---- transfuse if Hgb less than 7  Dementia and depression--- continue trazodone as needed, continue Zoloft 75 mg daily and Seroquel 12.5 mg No agitation, no oriented to time ,but to place and person       Recurrent Falls--- at baseline patient ambulates with a walker from ALF -Had a mechanical fall- No head injury no nausea no vomiting no chest pains and palpitations no syncopal type symptoms -CT head and CT C-spine without acute findings PTA pt lived at [facility] ALF and did very poorly, patient has significant limitations with mobility related ADLs- this patient needs to continue to be monitored in the hospital until a SNF bed is obtained as she is not safe to go home with her current physcical limitations -daughter desires palliative care to follow patient at snf     Dispo: The patient is from: ALF              Anticipated d/c is to: SNF              Anticipated d/c date is: on 12/9 -after 5 days isolation period due to COVID infection (tested positive for COVID 07/13/2022) If o2 requirement improves, otherwise may need to discuss comfort measures                   Code Status :  -  Code Status: DNR , verified with family   Family Communication: spoke with daughter [name] [phone] on 12/6 and 12/7    DVT Prophylaxis  :   - SCDs   heparin injection 5,000 Units Start: 07/10/22 1800 SCDs Start: 07/10/22 1559 Place TED hose Start: 07/10/22 1559   Lab Results  Component Value Date   PLT 137 (L) 07/16/2022    Inpatient Medications  Scheduled  Meds:  dextromethorphan-guaiFENesin  1 tablet Oral BID   heparin  5,000 Units Subcutaneous Q8H   insulin aspart  0-15 Units Subcutaneous TID WC   insulin aspart  0-5 Units Subcutaneous QHS   insulin glargine-yfgn  16 Units Subcutaneous QHS   methocarbamol  500 mg Oral TID   methylPREDNISolone (SOLU-MEDROL) injection  40 mg Intravenous Q24H   multivitamin with minerals  1 tablet Oral Daily   nirmatrelvir/ritonavir EUA (renal dosing)  2 tablet Oral BID   polyethylene glycol  17 g Oral Daily   QUEtiapine  12.5 mg Oral QHS   senna-docusate  2 tablet Oral QHS   sertraline  75 mg Oral Daily   sodium chloride flush  3 mL Intravenous  Q12H   sodium chloride flush  3 mL Intravenous Q12H   Continuous Infusions:  sodium chloride Stopped (07/13/22 2148)   cefTRIAXone (ROCEPHIN)  IV     PRN Meds:.sodium chloride, acetaminophen **OR** acetaminophen, acetaminophen, albuterol, ALPRAZolam, bisacodyl, guaiFENesin-dextromethorphan, hydrALAZINE, ipratropium-albuterol, ondansetron **OR** ondansetron (ZOFRAN) IV, mouth rinse, oxyCODONE, sodium chloride flush, traZODone   Anti-infectives (From admission, onward)    Start     Dose/Rate Route Frequency Ordered Stop   07/16/22 1730  cefTRIAXone (ROCEPHIN) 1 g in sodium chloride 0.9 % 100 mL IVPB        1 g 200 mL/hr over 30 Minutes Intravenous Every 24 hours 07/16/22 1638 07/21/22 1729   07/15/22 1430  cefTRIAXone (ROCEPHIN) 1 g in sodium chloride 0.9 % 100 mL IVPB        1 g 200 mL/hr over 30 Minutes Intravenous  Once 07/15/22 1342 07/15/22 1428   07/13/22 2200  nirmatrelvir/ritonavir EUA (PAXLOVID) 3 tablet  Status:  Discontinued        3 tablet Oral 2 times daily 07/13/22 1541 07/13/22 1550   07/13/22 1700  nirmatrelvir/ritonavir EUA (renal dosing) (PAXLOVID) 2 tablet        2 tablet Oral 2 times daily 07/13/22 1550 07/18/22 2159   07/13/22 0000  cephALEXin (KEFLEX) 250 MG capsule        250 mg Oral 3 times daily 07/13/22 1401 07/16/22 2359   07/13/22 0000  azithromycin (ZITHROMAX) 500 MG tablet        500 mg Oral Daily 07/13/22 1433 07/16/22 2359   07/11/22 1200  cefTRIAXone (ROCEPHIN) 1 g in sodium chloride 0.9 % 100 mL IVPB        1 g 200 mL/hr over 30 Minutes Intravenous Every 24 hours 07/10/22 1555 07/15/22 2359   07/10/22 1930  azithromycin (ZITHROMAX) 500 mg in sodium chloride 0.9 % 250 mL IVPB        500 mg 250 mL/hr over 60 Minutes Intravenous Every 24 hours 07/10/22 1917 07/14/22 1845   07/10/22 1300  cefTRIAXone (ROCEPHIN) 2 g in sodium chloride 0.9 % 100 mL IVPB        2 g 200 mL/hr over 30 Minutes Intravenous  Once 07/10/22 1255 07/10/22 1336         Subjective:  Sheena [name] today has no fevers, no emesis,  No chest pain,   -I did not hear her cough today during [place]ner -Oral intake is not great   Objective: Vitals:   07/15/22 2130 07/16/22 0511 07/16/22 1407 07/16/22 1408  BP: (!) 124/51 (!) 117/48    Pulse: 79 74 79 79  Resp: 20 18 (!) 22 (!) 24  Temp: 98.1 F (36.7 C) 98.5 F (36.9 C)    TempSrc: Oral     SpO2:  95% 90% (!) 81% 91%  Weight:      Height:        Intake/Output Summary (Last 24 hours) at 07/16/2022 1707 Last data filed at 07/16/2022 1637 Gross per 24 hour  Intake 240 ml  Output 900 ml  Net -660 ml   Filed Weights   07/10/22 0752 07/10/22 2028  Weight: 81.6 kg 81 kg    Physical Exam  Gen:- Awake Alert, frail and elderly, chronically ill-appearing, hard of hearing  HEENT:- [place].AT, No sclera icterus, HOH Nose- [place] 2L/min Neck-Supple Neck,No JVD,.  Lungs-diminished, no wheezing CV- S1, S2 normal, regular  Abd-  +ve B.Sounds, Abd Soft, No tenderness,    Extremity - pedal pulses present  Psych-affect is appropriate, oriented x2, not to time Neuro-generalized weakness, no new focal deficits, no tremors -Skin - warm, dry, left elbow skin tear MSK-discomfort with range of motion of left lower extremity and hips  Data Reviewed: I have personally reviewed following labs and imaging studies  CBC: Recent Labs  Lab 07/10/22 1000 07/12/22 0517 07/13/22 0858 07/15/22 0713 07/16/22 1002  WBC 20.4* 12.7* 14.0* 9.6 14.8*  NEUTROABS 18.4*  --   --   --   --   HGB 9.8* 7.4* 7.1* 7.0* 7.6*  HCT 29.9* 23.9* 22.1* 21.6* 23.5*  MCV 90.9 91.9 90.9 89.3 89.7  PLT 202 144* 119* 115* 426*   Basic Metabolic Panel: Recent Labs  Lab 07/10/22 1000 07/12/22 0517 07/13/22 0858 07/14/22 0604 07/16/22 1002  NA 140 137 138 138 134*  K 3.5 4.3 3.7 3.8 4.1  CL 101 106 106 105 102  CO2 '27 27 26 27 24  '$ GLUCOSE 103* 126* 76 84 197*  BUN 17 31* 25* 23 38*  CREATININE 1.19* 1.10* 0.86 0.87 1.17*  CALCIUM  8.3* 8.0* 7.7* 7.8* 7.6*  MG  --   --   --   --  2.3   GFR: Estimated Creatinine Clearance: 30.2 mL/min (A) (by C-G formula based on SCr of 1.17 mg/dL (H)).  Recent Results (from the past 240 hour(s))  Urine Culture     Status: Abnormal   Collection Time: 07/10/22 11:37 AM   Specimen: Urine, Clean Catch  Result Value Ref Range Status   Specimen Description   Final    URINE, CLEAN CATCH Performed at [facility], [address], [place], [place] [address]    Special Requests   Final    NONE Performed at [facility], [address] [place], [place] [address]    Culture >=100,000 COLONIES/mL KLEBSIELLA PNEUMONIAE (A)  Final   Report Status 07/13/2022 FINAL  Final   Organism ID, Bacteria KLEBSIELLA PNEUMONIAE (A)  Final      Susceptibility   Klebsiella pneumoniae - MIC*    AMPICILLIN >=32 RESISTANT Resistant     CEFAZOLIN <=4 SENSITIVE Sensitive     CEFEPIME <=0.12 SENSITIVE Sensitive     CEFTRIAXONE <=0.25 SENSITIVE Sensitive     CIPROFLOXACIN <=0.25 SENSITIVE Sensitive     GENTAMICIN <=1 SENSITIVE Sensitive     IMIPENEM <=0.25 SENSITIVE Sensitive     NITROFURANTOIN <=16 SENSITIVE Sensitive     TRIMETH/SULFA <=20 SENSITIVE Sensitive     AMPICILLIN/SULBACTAM 4 SENSITIVE Sensitive     PIP/TAZO <=4 SENSITIVE Sensitive     * >=100,000 COLONIES/mL KLEBSIELLA PNEUMONIAE  SARS Coronavirus 2 by RT PCR (hospital order, performed in [facility] hospital lab) *cepheid single result test* Anterior Nasal Swab     Status: Abnormal   Collection Time: 07/13/22  1:50 PM   Specimen: Anterior Nasal Swab  Result Value Ref Range Status   SARS Coronavirus 2 by RT PCR POSITIVE (A) NEGATIVE Final    Comment: (NOTE) SARS-CoV-2 target nucleic acids are DETECTED  SARS-CoV-2 RNA is generally detectable in upper respiratory specimens  during the acute phase of infection.  Positive results are indicative  of the presence of the identified virus, but do not rule out bacterial infection or co-infection  with other pathogens not detected by the test.  Clinical correlation with patient history and  other diagnostic information is necessary to determine patient infection status.  The expected result is negative.  Fact Sheet for Patients:   https://www.patel.info/   Fact Sheet for Healthcare Providers:   https://hall.com/    This test is not yet approved or cleared by the [place] FDA and  has been authorized for detection and/or diagnosis of SARS-CoV-2 by FDA under an Emergency Use Authorization (EUA).  This EUA will remain in effect (meaning this test can be used) for the duration of  the COVID-19 declaration under Section 564(b)(1)  of the Act, 21 U.S.C. section 360-bbb-3(b)(1), unless the authorization is terminated or revoked sooner.   Performed at [facility], [address], [place],  [address]    Scheduled Meds:  dextromethorphan-guaiFENesin  1 tablet Oral BID   heparin  5,000 Units Subcutaneous Q8H   insulin aspart  0-15 Units Subcutaneous TID WC   insulin aspart  0-5 Units Subcutaneous QHS   insulin glargine-yfgn  16 Units Subcutaneous QHS   methocarbamol  500 mg Oral TID   methylPREDNISolone (SOLU-MEDROL) injection  40 mg Intravenous Q24H   multivitamin with minerals  1 tablet Oral Daily   nirmatrelvir/ritonavir EUA (renal dosing)  2 tablet Oral BID   polyethylene glycol  17 g Oral Daily   QUEtiapine  12.5 mg Oral QHS   senna-docusate  2 tablet Oral QHS   sertraline  75 mg Oral Daily   sodium chloride flush  3 mL Intravenous Q12H   sodium chloride flush  3 mL Intravenous Q12H   Continuous Infusions:  sodium chloride Stopped (07/13/22 2148)   cefTRIAXone (ROCEPHIN)  IV      LOS: 6 days   Florencia Reasons M.D PhD FACP on 07/16/2022 at 5:07 PM  Go to [email or web address] - for contact info  [facility] - Office  [phone]  If 7PM-7AM, please contact night-coverage [email or web address] 07/16/2022, 5:07 PM

## 2022-07-17 ENCOUNTER — Inpatient Hospital Stay (HOSPITAL_COMMUNITY): Payer: Medicare Other

## 2022-07-17 DIAGNOSIS — S32592A Other specified fracture of left pubis, initial encounter for closed fracture: Secondary | ICD-10-CM | POA: Diagnosis not present

## 2022-07-17 LAB — GLUCOSE, CAPILLARY
Glucose-Capillary: 186 mg/dL — ABNORMAL HIGH (ref 70–99)
Glucose-Capillary: 186 mg/dL — ABNORMAL HIGH (ref 70–99)
Glucose-Capillary: 134 mg/dL — ABNORMAL HIGH (ref 70–99)
Glucose-Capillary: 161 mg/dL — ABNORMAL HIGH (ref 70–99)

## 2022-07-17 LAB — CBC WITH DIFFERENTIAL/PLATELET
Abs Immature Granulocytes: 0.22 10*3/uL — ABNORMAL HIGH (ref 0.00–0.07)
Basophils Absolute: 0 10*3/uL (ref 0.0–0.1)
Basophils Relative: 0 %
Eosinophils Absolute: 0 10*3/uL (ref 0.0–0.5)
Eosinophils Relative: 0 %
HCT: 22.6 % — ABNORMAL LOW (ref 36.0–46.0)
Hemoglobin: 7.2 g/dL — ABNORMAL LOW (ref 12.0–15.0)
Immature Granulocytes: 2 %
Lymphocytes Relative: 4 %
Lymphs Abs: 0.4 10*3/uL — ABNORMAL LOW (ref 0.7–4.0)
MCH: 28.3 pg (ref 26.0–34.0)
MCHC: 31.9 g/dL (ref 30.0–36.0)
MCV: 89 fL (ref 80.0–100.0)
Monocytes Absolute: 0.6 10*3/uL (ref 0.1–1.0)
Monocytes Relative: 6 %
Neutro Abs: 9 10*3/uL — ABNORMAL HIGH (ref 1.7–7.7)
Neutrophils Relative %: 88 %
Platelets: 158 10*3/uL (ref 150–400)
RBC: 2.54 MIL/uL — ABNORMAL LOW (ref 3.87–5.11)
RDW: 16.8 % — ABNORMAL HIGH (ref 11.5–15.5)
WBC: 10.2 10*3/uL (ref 4.0–10.5)
nRBC: 0.4 % — ABNORMAL HIGH (ref 0.0–0.2)

## 2022-07-17 LAB — BASIC METABOLIC PANEL
Anion gap: 9 (ref 5–15)
BUN: 48 mg/dL — ABNORMAL HIGH (ref 8–23)
CO2: 26 mmol/L (ref 22–32)
Calcium: 7.4 mg/dL — ABNORMAL LOW (ref 8.9–10.3)
Chloride: 104 mmol/L (ref 98–111)
Creatinine, Ser: 1.33 mg/dL — ABNORMAL HIGH (ref 0.44–1.00)
GFR, Estimated: 37 mL/min — ABNORMAL LOW (ref >60)
Glucose, Bld: 214 mg/dL — ABNORMAL HIGH (ref 70–99)
Potassium: 3.5 mmol/L (ref 3.5–5.1)
Sodium: 139 mmol/L (ref 135–145)

## 2022-07-17 LAB — PHOSPHORUS: Phosphorus: 4.5 mg/dL (ref 2.5–4.6)

## 2022-07-17 LAB — MAGNESIUM: Magnesium: 2.4 mg/dL (ref 1.7–2.4)

## 2022-07-17 MED ORDER — GLUCERNA SHAKE PO LIQD
237.0000 mL | Freq: Three times a day (TID) | ORAL | Status: DC
  Administered 2022-07-17 – 2022-07-23 (×10): 237 mL via ORAL

## 2022-07-17 MED ORDER — FUROSEMIDE 10 MG/ML IJ SOLN
40.0000 mg | Freq: Once | INTRAMUSCULAR | Status: AC
  Administered 2022-07-17: 40 mg via INTRAVENOUS
  Filled 2022-07-17: qty 4

## 2022-07-17 MED ORDER — DOXYCYCLINE HYCLATE 100 MG PO TABS
100.0000 mg | ORAL_TABLET | Freq: Two times a day (BID) | ORAL | Status: DC
  Administered 2022-07-17 – 2022-07-22 (×10): 100 mg via ORAL
  Filled 2022-07-17 (×11): qty 1

## 2022-07-17 NOTE — Progress Notes (Signed)
Occupational Therapy Treatment Patient Details Name: Sheena [name] MRN: [id number] DOB: [birth year] Today's Date: 07/17/2022   History of present illness Sheena [name]  is a 87 y.o. female with pmhx relevant for depression/demention, HTN , DM2.Marland Kitchen  COPD presents from [facility] [place] assisted living facility with Lt hip pain after falling  -At baseline patient ambulates with a walker, she apparently was going to the bathroom and tripped and fell landing on the left hip also has a skin tear to her left elbow  -Apparently no loss of consciousness  -No head injury no nausea no vomiting no chest pains and palpitations no syncopal type symptoms  - patient is a poor historian overall, so additional history and Code status verified with daughter [name] [phone]---  -Attempted to reach patient's daughters [name] [phone] unable to reach her, left voicemails   -I spoke with daughter [name] [phone]---  -hip x-rays show left-sided pubic rami fractures  -CT head and CT C-spine without acute findings  -CT chest with bronchiectasis, pulm Neri fibrosis and possible interstitial pneumonia,  , Left-sided second third and fourth rib fractures   -BNP 60  -UA suggestive of UTI  -WBC 20.4 hemoglobin 9.8 which is close to prior baseline platelets 202  -Creatinine 1.1   OT comments  Pt agreeable to OT and PT co-treatment. Pt required max A for bed mobility and transfers today. Pt was very limited by L hip pain with much moaning and guarding during mobility. Pt attempted to stand with RW but achieved only partial stand. Transfer was graded to stand pivot without RW which required max A from PT. B UE exercises were attempted but pt was reluctant to participate. Pt was able to eat applesauce at end of session with set up assistance. Pt left in chair with chair alarm set and nursing staff notified. Pt will benefit from continued OT in the hospital and recommended venue below to increase strength,  balance, and endurance for safe ADL's.      Recommendations for follow up therapy are one component of a multi-disciplinary discharge planning process, led by the attending physician.  Recommendations may be updated based on patient status, additional functional criteria and insurance authorization.    Follow Up Recommendations  Skilled nursing-short term rehab (<3 hours/day)     Assistance Recommended at Discharge Frequent or constant Supervision/Assistance  Patient can return home with the following  A lot of help with walking and/or transfers;A lot of help with bathing/dressing/bathroom;Assistance with cooking/housework;Assistance with feeding;Direct supervision/assist for medications management;Assist for transportation;Help with stairs or ramp for entrance   Equipment Recommendations  None recommended by OT    Recommendations for Other Services      Precautions / Restrictions Precautions Precautions: Fall Restrictions Weight Bearing Restrictions: No       Mobility Bed Mobility Overal bed mobility: Needs Assistance Bed Mobility: Supine to Sit     Supine to sit: Max assist     General bed mobility comments: Slow labored movement with c/o severe pain left hip; attempted to pull to sit with B UE but pt required more back support.    Transfers Overall transfer level: Needs assistance Equipment used: Rolling walker (2 wheels) Transfers: Sit to/from Stand, Bed to chair/wheelchair/BSC Sit to Stand: Max assist Stand pivot transfers: Max assist         General transfer comment: Sit to stand attempted with PT assisting with use of RW, but pt was in much pain and achieved only partial stand. Task was  graded to stand pivot without RW and much assist from PT.     Balance Overall balance assessment: Needs assistance Sitting-balance support: Bilateral upper extremity supported, Feet supported Sitting balance-Leahy Scale: Fair Sitting balance - Comments: fair seated at  EOB Postural control: Right lateral lean Standing balance support: Reliant on assistive device for balance, Bilateral upper extremity supported, During functional activity Standing balance-Leahy Scale: Poor Standing balance comment: using RW and without                           ADL either performed or assessed with clinical judgement   ADL Overall ADL's : Needs assistance/impaired Eating/Feeding: Set up;Sitting Eating/Feeding Details (indicate cue type and reason): Pt able to eat applesauce with set up assist while seated in the recliner today.                                          Cognition Arousal/Alertness: Awake/alert Behavior During Therapy: Anxious Overall Cognitive Status: History of cognitive impairments - at baseline                                                             Pertinent Vitals/ Pain       Pain Assessment Pain Assessment: Faces Faces Pain Scale: Hurts whole lot Pain Location: left hip Pain Descriptors / Indicators: Grimacing, Guarding, Moaning Pain Intervention(s): Limited activity within patient's tolerance, Monitored during session, Repositioned                                                          Frequency  Min 2X/week        Progress Toward Goals  OT Goals(current goals can now be found in the care plan section)  Progress towards OT goals: Progressing toward goals  Acute Rehab OT Goals Patient Stated Goal: none stated OT Goal Formulation: Patient unable to participate in goal setting Time For Goal Achievement: 07/28/22 Potential to Achieve Goals: Fair ADL Goals Pt Will Perform Eating: with min guard assist;sitting Pt Will Perform Grooming: with min assist;sitting Pt Will Perform Upper Body Bathing: with min assist;with min guard assist;sitting Pt Will Perform Upper Body Dressing: with min guard assist;with min assist;sitting Pt Will Perform Lower  Body Dressing: with mod assist;sitting/lateral leans Pt Will Transfer to Toilet: with min assist;with min guard assist;stand pivot transfer Pt/caregiver will Perform Home Exercise Program: Increased ROM;Increased strength;Right Upper extremity;Left upper extremity;With Supervision  Plan Discharge plan remains appropriate    Co-evaluation    PT/OT/SLP Co-Evaluation/Treatment: Yes Reason for Co-Treatment: Complexity of the patient's impairments (multi-system involvement)   OT goals addressed during session: ADL's and self-care                         End of Session Equipment Utilized During Treatment: Rolling walker (2 wheels);Oxygen  OT Visit Diagnosis: Unsteadiness on feet (R26.81);Other abnormalities of gait and mobility (R26.89);Muscle weakness (generalized) (M62.81);History of falling (Z91.81)   Activity Tolerance Patient tolerated treatment  well   Patient Left in chair;with call bell/phone within reach;with chair alarm set   Nurse Communication Other (comment)        Time: 7460-0298 OT Time Calculation (min): 23 min  Charges: OT General Charges $OT Visit: 1 Visit OT Treatments $Self Care/Home Management : 8-22 mins  Perry Brucato OT, MOT  Larey Seat 07/17/2022, 10:00 AM

## 2022-07-17 NOTE — Progress Notes (Addendum)
PROGRESS NOTE     Sheena [name], is a 88 y.o. female, DOB - 09/04/1923, OIN:[id number]  Admit date - 07/10/2022   Admitting Physician Courage Denton Brick, MD  Outpatient Primary MD for the patient is [name], [name], MD  LOS - 7  Chief Complaint  Patient presents with   Fall          Brief Narrative:   88 y.o. female with pmhx relevant for depression/demention, HTN , DM2.Sheena [name]  COPD admitted on 07/10/2022 from [facility] with Lt hip pain after mechanical fall and found to have pubic rami fractures, rib fracture, also found to + covid, worsening hypoxia  12/8 7:30 pm addendum:  please let night shift know to call HPOA grandson preston at [phone] and daughter sharon at [phone], they are both aware the possibility and agreed to be called at night if Sharry deteriorate. please call night MD ,will plan to transition to full comfort measures if Clarice deteriorate. thx  - A/p  Klebsiella UTI -finished treatment with IV Rocephin -last dose 07/15/2022    Acute Left pubic rami fractures----after mechanical fall (POA) -Outpatient follow-up with orthopedic  --Pain control appears adequate -SNF placement   Lt sided rib fractures from the fall, does not appear in pain   COVID-19 infection with acute on chronic hypoxic respiratory failure--she was tested + on 12/4 -reports on home o2 at 2liters for a few years  --IV steroids and bronchodilators as ordered, -Started on Paxlovid on 07/13/22 -increased oxygen requirement on 12/7, repeat cxr showed worsening of pneumonia,  started back on abx, continue steroids, paxlovid, appear responded to iv lasix, continue as needed, monitor bp and cr -discussed with daughter may consider comfort measures if she does not improve     IDDM2- -episodes of hypoglycemia , insulin dose decrease, continue to adjust insulin dose as needed -currently on steroids, plan to wean steroids    HTN- bp stable    chronic anemia- Hgb  is down to 7.1 from 9.8 - no bleeding concerns at this time monitor closely -Repeat CBC on 07/15/22---- transfuse if Hgb less than 7  Dementia and depression--- continue trazodone as needed, continue Zoloft 75 mg daily and Seroquel 12.5 mg No agitation, no oriented to time ,but to place and person      Recurrent Falls--- at baseline patient ambulates with a walker from ALF -presents with  a mechanical fall- -CT head and CT C-spine without acute findings PTA pt lived at [facility] and did very poorly, patient has significant limitations with mobility related ADLs- this patient needs to continue to be monitored in the hospital until a SNF bed is obtained as she is not safe to go home with her current physcical limitations -daughter desires palliative care to follow patient at snf     Dispo: The patient is from: ALF                         Anticipated d/c date is:  may need to transition to full comfort measures if she continue to deteriorates                  Code Status :  -  Code Status: DNR , verified with family   Family Communication: spoke with daughter [name] [phone] on 12/6 , 12/7, 12/8 [name] on 12/8    DVT Prophylaxis  :   - SCDs   heparin injection 5,000 Units Start: 07/10/22 1800 SCDs  Start: 07/10/22 1559 Place TED hose Start: 07/10/22 1559   Lab Results  Component Value Date   PLT 158 07/17/2022    Inpatient Medications  Scheduled Meds:  dextromethorphan-guaiFENesin  1 tablet Oral BID   doxycycline  100 mg Oral Q12H   furosemide  40 mg Intravenous Once   heparin  5,000 Units Subcutaneous Q8H   insulin aspart  0-15 Units Subcutaneous TID WC   insulin aspart  0-5 Units Subcutaneous QHS   insulin glargine-yfgn  16 Units Subcutaneous QHS   methocarbamol  500 mg Oral TID   methylPREDNISolone (SOLU-MEDROL) injection  40 mg Intravenous Q24H   multivitamin with minerals  1 tablet Oral Daily   nirmatrelvir/ritonavir EUA (renal dosing)  2  tablet Oral BID   polyethylene glycol  17 g Oral Daily   QUEtiapine  12.5 mg Oral QHS   senna-docusate  2 tablet Oral QHS   sertraline  75 mg Oral Daily   sodium chloride flush  3 mL Intravenous Q12H   sodium chloride flush  3 mL Intravenous Q12H   Continuous Infusions:  sodium chloride Stopped (07/13/22 2148)   PRN Meds:.sodium chloride, acetaminophen **OR** acetaminophen, acetaminophen, albuterol, ALPRAZolam, bisacodyl, guaiFENesin-dextromethorphan, hydrALAZINE, ipratropium-albuterol, ondansetron **OR** ondansetron (ZOFRAN) IV, mouth rinse, oxyCODONE, sodium chloride flush, traZODone   Anti-infectives (From admission, onward)    Start     Dose/Rate Route Frequency Ordered Stop   07/17/22 1445  doxycycline (VIBRA-TABS) tablet 100 mg        100 mg Oral Every 12 hours 07/17/22 1356     07/16/22 1730  cefTRIAXone (ROCEPHIN) 1 g in sodium chloride 0.9 % 100 mL IVPB  Status:  Discontinued        1 g 200 mL/hr over 30 Minutes Intravenous Every 24 hours 07/16/22 1638 07/17/22 1356   07/15/22 1430  cefTRIAXone (ROCEPHIN) 1 g in sodium chloride 0.9 % 100 mL IVPB        1 g 200 mL/hr over 30 Minutes Intravenous  Once 07/15/22 1342 07/15/22 1428   07/13/22 2200  nirmatrelvir/ritonavir EUA (PAXLOVID) 3 tablet  Status:  Discontinued        3 tablet Oral 2 times daily 07/13/22 1541 07/13/22 1550   07/13/22 1700  nirmatrelvir/ritonavir EUA (renal dosing) (PAXLOVID) 2 tablet        2 tablet Oral 2 times daily 07/13/22 1550 07/18/22 2159   07/13/22 0000  cephALEXin (KEFLEX) 250 MG capsule        250 mg Oral 3 times daily 07/13/22 1401 07/16/22 2359   07/13/22 0000  azithromycin (ZITHROMAX) 500 MG tablet        500 mg Oral Daily 07/13/22 1433 07/16/22 2359   07/11/22 1200  cefTRIAXone (ROCEPHIN) 1 g in sodium chloride 0.9 % 100 mL IVPB        1 g 200 mL/hr over 30 Minutes Intravenous Every 24 hours 07/10/22 1555 07/15/22 2359   07/10/22 1930  azithromycin (ZITHROMAX) 500 mg in sodium chloride 0.9  % 250 mL IVPB        500 mg 250 mL/hr over 60 Minutes Intravenous Every 24 hours 07/10/22 1917 07/14/22 1845   07/10/22 1300  cefTRIAXone (ROCEPHIN) 2 g in sodium chloride 0.9 % 100 mL IVPB        2 g 200 mL/hr over 30 Minutes Intravenous  Once 07/10/22 1255 07/10/22 1336        Subjective:  She is on 10liter HFNC, she does not appear in acute distress, I did not hear  cough, there is no fever, poor oral intake, start nutrition supplement with glucerna    Objective: Vitals:   07/16/22 2104 07/17/22 0413 07/17/22 0930 07/17/22 1451  BP: (!) 122/52 (!) 116/56  (!) 112/52  Pulse: 77 74  69  Resp:  (!) 22  20  Temp: 97.6 F (36.4 C) 98.3 F (36.8 C)  97.7 F (36.5 C)  TempSrc: Oral Oral  Oral  SpO2: 95% 99% 92% 90%  Weight:      Height:        Intake/Output Summary (Last 24 hours) at 07/17/2022 1544 Last data filed at 07/17/2022 1300 Gross per 24 hour  Intake 220 ml  Output 1700 ml  Net -1480 ml   Filed Weights   07/10/22 0752 07/10/22 2028  Weight: 81.6 kg 81 kg    Physical Exam  Gen:- Awake Alert, frail and elderly, chronically ill-appearing, hard of hearing  HEENT:- [place].AT, No sclera icterus, HOH Nose- [place] 2L/min Neck-Supple Neck,No JVD,.  Lungs-diminished, no wheezing CV- S1, S2 normal, regular  Abd-  +ve B.Sounds, Abd Soft, No tenderness,    Extremity - pedal pulses present  Psych-affect is appropriate, oriented x2, not to time Neuro-generalized weakness, no new focal deficits, no tremors -Skin - warm, dry, left elbow skin tear MSK-discomfort with range of motion of left lower extremity and hips  Data Reviewed: I have personally reviewed following labs and imaging studies  CBC: Recent Labs  Lab 07/12/22 0517 07/13/22 0858 07/15/22 0713 07/16/22 1002 07/17/22 0535  WBC 12.7* 14.0* 9.6 14.8* 10.2  NEUTROABS  --   --   --   --  9.0*  HGB 7.4* 7.1* 7.0* 7.6* 7.2*  HCT 23.9* 22.1* 21.6* 23.5* 22.6*  MCV 91.9 90.9 89.3 89.7 89.0  PLT 144* 119* 115* 137*  935   Basic Metabolic Panel: Recent Labs  Lab 07/12/22 0517 07/13/22 0858 07/14/22 0604 07/16/22 1002 07/17/22 0535  NA 137 138 138 134* 139  K 4.3 3.7 3.8 4.1 3.5  CL 106 106 105 102 104  CO2 '27 26 27 24 26  '$ GLUCOSE 126* 76 84 197* 214*  BUN 31* 25* 23 38* 48*  CREATININE 1.10* 0.86 0.87 1.17* 1.33*  CALCIUM 8.0* 7.7* 7.8* 7.6* 7.4*  MG  --   --   --  2.3 2.4  PHOS  --   --   --   --  4.5   GFR: Estimated Creatinine Clearance: 26.6 mL/min (A) (by C-G formula based on SCr of 1.33 mg/dL (H)).  Recent Results (from the past 240 hour(s))  Urine Culture     Status: Abnormal   Collection Time: 07/10/22 11:37 AM   Specimen: Urine, Clean Catch  Result Value Ref Range Status   Specimen Description   Final    URINE, CLEAN CATCH Performed at [facility], [address] [place], [place] [address]    Special Requests   Final    NONE Performed at [facility] [address], [place], [place] [address]    Culture >=100,000 COLONIES/mL KLEBSIELLA PNEUMONIAE (A)  Final   Report Status 07/13/2022 FINAL  Final   Organism ID, Bacteria KLEBSIELLA PNEUMONIAE (A)  Final      Susceptibility   Klebsiella pneumoniae - MIC*    AMPICILLIN >=32 RESISTANT Resistant     CEFAZOLIN <=4 SENSITIVE Sensitive     CEFEPIME <=0.12 SENSITIVE Sensitive     CEFTRIAXONE <=0.25 SENSITIVE Sensitive     CIPROFLOXACIN <=0.25 SENSITIVE Sensitive     GENTAMICIN <=1  SENSITIVE Sensitive     IMIPENEM <=0.25 SENSITIVE Sensitive     NITROFURANTOIN <=16 SENSITIVE Sensitive     TRIMETH/SULFA <=20 SENSITIVE Sensitive     AMPICILLIN/SULBACTAM 4 SENSITIVE Sensitive     PIP/TAZO <=4 SENSITIVE Sensitive     * >=100,000 COLONIES/mL KLEBSIELLA PNEUMONIAE  SARS Coronavirus 2 by RT PCR (hospital order, performed in [place] hospital lab) *cepheid single result test* Anterior Nasal Swab     Status: Abnormal   Collection Time: 07/13/22  1:50 PM   Specimen: Anterior Nasal Swab  Result Value Ref Range Status   SARS  Coronavirus 2 by RT PCR POSITIVE (A) NEGATIVE Final    Comment: (NOTE) SARS-CoV-2 target nucleic acids are DETECTED  SARS-CoV-2 RNA is generally detectable in upper respiratory specimens  during the acute phase of infection.  Positive results are indicative  of the presence of the identified virus, but do not rule out bacterial infection or co-infection with other pathogens not detected by the test.  Clinical correlation with patient history and  other diagnostic information is necessary to determine patient infection status.  The expected result is negative.  Fact Sheet for Patients:   https://www.patel.info/   Fact Sheet for Healthcare Providers:   https://hall.com/    This test is not yet approved or cleared by the [place] FDA and  has been authorized for detection and/or diagnosis of SARS-CoV-2 by FDA under an Emergency Use Authorization (EUA).  This EUA will remain in effect (meaning this test can be used) for the duration of  the COVID-19 declaration under Section 564(b)(1)  of the Act, 21 U.S.C. section 360-bbb-3(b)(1), unless the authorization is terminated or revoked sooner.   Performed at [facility], [address], [place], [place] [address]    Scheduled Meds:  dextromethorphan-guaiFENesin  1 tablet Oral BID   doxycycline  100 mg Oral Q12H   furosemide  40 mg Intravenous Once   heparin  5,000 Units Subcutaneous Q8H   insulin aspart  0-15 Units Subcutaneous TID WC   insulin aspart  0-5 Units Subcutaneous QHS   insulin glargine-yfgn  16 Units Subcutaneous QHS   methocarbamol  500 mg Oral TID   methylPREDNISolone (SOLU-MEDROL) injection  40 mg Intravenous Q24H   multivitamin with minerals  1 tablet Oral Daily   nirmatrelvir/ritonavir EUA (renal dosing)  2 tablet Oral BID   polyethylene glycol  17 g Oral Daily   QUEtiapine  12.5 mg Oral QHS   senna-docusate  2 tablet Oral QHS   sertraline  75 mg Oral Daily   sodium  chloride flush  3 mL Intravenous Q12H   sodium chloride flush  3 mL Intravenous Q12H   Continuous Infusions:  sodium chloride Stopped (07/13/22 2148)    LOS: 7 days   Florencia Reasons M.D PhD FACP on 07/17/2022 at 3:44 PM  Go to [email or web address] - for contact info  [facility] - Office  [phone]  If 7PM-7AM, please contact night-coverage [email or web address] 07/17/2022, 3:44 PM

## 2022-07-17 NOTE — Progress Notes (Signed)
Pt had a low o2 sat of 68% on 3L when the tech called me into the room. Assessed the patient and turned up her oxygen gradually to 6L and she was sating at 80%. Notified MD and called respiratory and asked for them to do a PRN neb tx. MD ordered a dose of '40mg'$  of IV lasix, and CXR. Came back to check on patient and noted she was on 15 L HFNC after RT came to see her. With a sat of 100%. This nurse tried to wean her down to 10L HFNC and her o2 sat maintained at 95%. Notified MD her current BP was 123/72 MAP 83, Pulse 78 and R 24. Notified MD of the output with the first dose of lasix. MD put in N.O of another dose of 40 MG IV Lasix and stated to leave patient on the 10 HFNC. No distress noted on patient. Environmental health practitioner.

## 2022-07-17 NOTE — Progress Notes (Signed)
Physical Therapy Treatment Patient Details Name: Sheena [name] MRN: [id number] DOB: [birth year] Today's Date: 07/17/2022   History of Present Illness Sheena [name]  is a 88 y.o. female with pmhx relevant for depression/demention, HTN , DM2.Marland Kitchen  COPD presents from [facility] with Lt hip pain after falling  -At baseline patient ambulates with a walker, she apparently was going to the bathroom and tripped and fell landing on the left hip also has a skin tear to her left elbow  -Apparently no loss of consciousness  -No head injury no nausea no vomiting no chest pains and palpitations no syncopal type symptoms  - patient is a poor historian overall, so additional history and Code status verified with daughter [name] [phone]---  -Attempted to reach patient's daughters [name] [phone] unable to reach her, left voicemails   -I spoke with daughter [name] [phone]---  -hip x-rays show left-sided pubic rami fractures  -CT head and CT C-spine without acute findings  -CT chest with bronchiectasis, pulm Neri fibrosis and possible interstitial pneumonia,  , Left-sided second third and fourth rib fractures   -BNP 60  -UA suggestive of UTI  -WBC 20.4 hemoglobin 9.8 which is close to prior baseline platelets 202  -Creatinine 1.1    PT Comments    Patient demonstrates slow labored movement for sitting up at bedside with c/o severe pain left hip, frequent guarding of LLE resulting in frequent episodes of leaning/falling backwards, unable to transfer using RW mostly due to apprehension and required Max assist stand pivot with right knee blocked to transfer to chair.  Patient tolerated sitting up in chair after therapy - nursing staff notified.  Patient will benefit from continued skilled physical therapy in hospital and recommended venue below to increase strength, balance, endurance for safe ADLs and gait.      Recommendations for follow up therapy are one  component of a multi-disciplinary discharge planning process, led by the attending physician.  Recommendations may be updated based on patient status, additional functional criteria and insurance authorization.  Follow Up Recommendations  Skilled nursing-short term rehab (<3 hours/day) Can patient physically be transported by private vehicle: No   Assistance Recommended at Discharge Intermittent Supervision/Assistance  Patient can return home with the following A lot of help with bathing/dressing/bathroom;A lot of help with walking and/or transfers;Help with stairs or ramp for entrance;Assistance with cooking/housework   Equipment Recommendations  None recommended by PT    Recommendations for Other Services       Precautions / Restrictions Precautions Precautions: Fall Restrictions Weight Bearing Restrictions: No     Mobility  Bed Mobility Overal bed mobility: Needs Assistance Bed Mobility: Supine to Sit     Supine to sit: Max assist     General bed mobility comments: slow labored movment with poor tolerance for moving LLE due to increased hip pain    Transfers Overall transfer level: Needs assistance Equipment used: Rolling walker (2 wheels), 1 person hand held assist Transfers: Sit to/from Stand, Bed to chair/wheelchair/BSC Sit to Stand: Max assist Stand pivot transfers: Max assist         General transfer comment: [place] unable to transfer using RW due to poor carryover and required Max assist stand pivot transfer with right knee blocked to transfer to chair    Ambulation/Gait                   Stairs  Wheelchair Mobility    Modified Rankin (Stroke Patients Only)       Balance Overall balance assessment: Needs assistance Sitting-balance support: Feet supported, No upper extremity supported Sitting balance-Leahy Scale: Poor Sitting balance - Comments: fair/poor with frequent leaning/falling backwards mostly due to guarding  when pain increases left hip Postural control: Posterior lean, Right lateral lean Standing balance support: During functional activity, Reliant on assistive device for balance, Bilateral upper extremity supported Standing balance-Leahy Scale: Poor Standing balance comment: using RW and without                            Cognition Arousal/Alertness: Awake/alert Behavior During Therapy: Anxious Overall Cognitive Status: History of cognitive impairments - at baseline                                          Exercises      General Comments        Pertinent Vitals/Pain Pain Assessment Pain Assessment: Faces Faces Pain Scale: Hurts whole lot Pain Location: left hip Pain Descriptors / Indicators: Grimacing, Guarding, Moaning Pain Intervention(s): Limited activity within patient's tolerance, Monitored during session, Repositioned    Home Living                          Prior Function            PT Goals (current goals can now be found in the care plan section) Acute Rehab PT Goals Patient Stated Goal: return home PT Goal Formulation: With patient Time For Goal Achievement: 07/25/22 Potential to Achieve Goals: Good Progress towards PT goals: Progressing toward goals    Frequency    Min 3X/week      PT Plan Current plan remains appropriate    Co-evaluation PT/OT/SLP Co-Evaluation/Treatment: Yes Reason for Co-Treatment: Complexity of the patient's impairments (multi-system involvement);To address functional/ADL transfers PT goals addressed during session: Mobility/safety with mobility;Balance;Proper use of DME OT goals addressed during session: ADL's and self-care      AM-PAC PT "6 Clicks" Mobility   Outcome Measure  Help needed turning from your back to your side while in a flat bed without using bedrails?: A Lot Help needed moving from lying on your back to sitting on the side of a flat bed without using bedrails?: A  Lot Help needed moving to and from a bed to a chair (including a wheelchair)?: A Lot Help needed standing up from a chair using your arms (e.g., wheelchair or bedside chair)?: A Lot Help needed to walk in hospital room?: A Lot Help needed climbing 3-5 steps with a railing? : Total 6 Click Score: 11    End of Session   Activity Tolerance: Patient tolerated treatment well;Patient limited by fatigue;Patient limited by pain Patient left: in chair;with call bell/phone within reach;with chair alarm set Nurse Communication: Mobility status PT Visit Diagnosis: Unsteadiness on feet (R26.81);Other abnormalities of gait and mobility (R26.89);Muscle weakness (generalized) (M62.81)     Time: 5681-2751 PT Time Calculation (min) (ACUTE ONLY): 29 min  Charges:  $Therapeutic Activity: 23-37 mins                     11:30 AM, 07/17/22 Lonell Grandchild, MPT Physical Therapist with [facility] 336 [phone] office [phone] mobile phone

## 2022-07-18 DIAGNOSIS — S32592A Other specified fracture of left pubis, initial encounter for closed fracture: Secondary | ICD-10-CM | POA: Diagnosis not present

## 2022-07-18 LAB — GLUCOSE, CAPILLARY
Glucose-Capillary: 170 mg/dL — ABNORMAL HIGH (ref 70–99)
Glucose-Capillary: 166 mg/dL — ABNORMAL HIGH (ref 70–99)
Glucose-Capillary: 171 mg/dL — ABNORMAL HIGH (ref 70–99)
Glucose-Capillary: 197 mg/dL — ABNORMAL HIGH (ref 70–99)

## 2022-07-18 LAB — RETICULOCYTES
Immature Retic Fract: 26.8 % — ABNORMAL HIGH (ref 2.3–15.9)
RBC.: 2.62 MIL/uL — ABNORMAL LOW (ref 3.87–5.11)
Retic Count, Absolute: 109.8 10*3/uL (ref 19.0–186.0)
Retic Ct Pct: 4.2 % — ABNORMAL HIGH (ref 0.4–3.1)

## 2022-07-18 LAB — HEPATIC FUNCTION PANEL
ALT: 20 U/L (ref 0–44)
AST: 27 U/L (ref 15–41)
Albumin: 2.3 g/dL — ABNORMAL LOW (ref 3.5–5.0)
Alkaline Phosphatase: 98 U/L (ref 38–126)
Bilirubin, Direct: 0.2 mg/dL (ref 0.0–0.2)
Indirect Bilirubin: 0.7 mg/dL (ref 0.3–0.9)
Total Bilirubin: 0.9 mg/dL (ref 0.3–1.2)
Total Protein: 6.8 g/dL (ref 6.5–8.1)

## 2022-07-18 LAB — CBC WITH DIFFERENTIAL/PLATELET
Abs Immature Granulocytes: 0.27 10*3/uL — ABNORMAL HIGH (ref 0.00–0.07)
Basophils Absolute: 0 10*3/uL (ref 0.0–0.1)
Basophils Relative: 0 %
Eosinophils Absolute: 0 10*3/uL (ref 0.0–0.5)
Eosinophils Relative: 0 %
HCT: 23.4 % — ABNORMAL LOW (ref 36.0–46.0)
Hemoglobin: 7.4 g/dL — ABNORMAL LOW (ref 12.0–15.0)
Immature Granulocytes: 2 %
Lymphocytes Relative: 4 %
Lymphs Abs: 0.5 10*3/uL — ABNORMAL LOW (ref 0.7–4.0)
MCH: 28.2 pg (ref 26.0–34.0)
MCHC: 31.6 g/dL (ref 30.0–36.0)
MCV: 89.3 fL (ref 80.0–100.0)
Monocytes Absolute: 0.5 10*3/uL (ref 0.1–1.0)
Monocytes Relative: 4 %
Neutro Abs: 10.8 10*3/uL — ABNORMAL HIGH (ref 1.7–7.7)
Neutrophils Relative %: 90 %
Platelets: 175 10*3/uL (ref 150–400)
RBC: 2.62 MIL/uL — ABNORMAL LOW (ref 3.87–5.11)
RDW: 17 % — ABNORMAL HIGH (ref 11.5–15.5)
WBC: 12.1 10*3/uL — ABNORMAL HIGH (ref 4.0–10.5)
nRBC: 0.2 % (ref 0.0–0.2)

## 2022-07-18 LAB — BASIC METABOLIC PANEL
Anion gap: 10 (ref 5–15)
BUN: 58 mg/dL — ABNORMAL HIGH (ref 8–23)
CO2: 27 mmol/L (ref 22–32)
Calcium: 7.4 mg/dL — ABNORMAL LOW (ref 8.9–10.3)
Chloride: 104 mmol/L (ref 98–111)
Creatinine, Ser: 1.35 mg/dL — ABNORMAL HIGH (ref 0.44–1.00)
GFR, Estimated: 36 mL/min — ABNORMAL LOW (ref >60)
Glucose, Bld: 178 mg/dL — ABNORMAL HIGH (ref 70–99)
Potassium: 3.5 mmol/L (ref 3.5–5.1)
Sodium: 141 mmol/L (ref 135–145)

## 2022-07-18 LAB — C-REACTIVE PROTEIN: CRP: 11.9 mg/dL — ABNORMAL HIGH (ref <1.0)

## 2022-07-18 LAB — PROCALCITONIN: Procalcitonin: 0.12 ng/mL

## 2022-07-18 MED ORDER — IPRATROPIUM-ALBUTEROL 0.5-2.5 (3) MG/3ML IN SOLN
3.0000 mL | Freq: Three times a day (TID) | RESPIRATORY_TRACT | Status: DC
  Administered 2022-07-18 – 2022-07-19 (×4): 3 mL via RESPIRATORY_TRACT
  Filled 2022-07-18 (×5): qty 3

## 2022-07-18 MED ORDER — CHLORHEXIDINE GLUCONATE CLOTH 2 % EX PADS
6.0000 | MEDICATED_PAD | Freq: Every day | CUTANEOUS | Status: DC
  Administered 2022-07-18 – 2022-07-23 (×6): 6 via TOPICAL

## 2022-07-18 MED ORDER — MORPHINE SULFATE (PF) 2 MG/ML IV SOLN
1.0000 mg | INTRAVENOUS | Status: DC | PRN
  Administered 2022-07-19 – 2022-07-22 (×6): 1 mg via INTRAVENOUS
  Filled 2022-07-18 (×6): qty 1

## 2022-07-18 NOTE — Progress Notes (Signed)
Turned patient oxygen down to 10 HFNC and sats her 94%. Notified MD

## 2022-07-18 NOTE — Progress Notes (Signed)
MD stated that pt is not ready to transition to full comfort care. Notified MD to put in order to transition to ICU. Environmental health practitioner.

## 2022-07-18 NOTE — Progress Notes (Signed)
PROGRESS NOTE     Sheena [name], is a 88 y.o. female, DOB - [birth year], NLG:[id number]  Admit date - 07/10/2022   Admitting Physician Courage Denton Brick, MD  Outpatient Primary MD for the patient is [name], [name], MD  LOS - 8  Chief Complaint  Patient presents with   Fall          Brief Narrative:   88 y.o. female with pmhx relevant for depression/demention, HTN , DM2.Marland Kitchen  COPD admitted on 07/10/2022 from [facility] [employer] [facility] [facility] living facility with Lt hip pain after mechanical fall and found to have pubic rami fractures, rib fracture, also found to + covid, worsening hypoxia   @'@please'$  call HPOA grandson [name] at [phone] and daughter sharon at [phone] if patient deteriorate, they are both aware the possibility and agreed to be called at night if [place] deteriorate. Family agreed to transition to full comfort measures if Jaianna deteriorate.@@  A/p  Klebsiella UTI -finished treatment with IV Rocephin -last dose 07/15/2022    Acute Left pubic rami fractures----after mechanical fall (POA) -Outpatient follow-up with orthopedic  --Pain control appears adequate -SNF placement   Lt sided rib fractures from the fall, does not appear in pain   COVID-19 infection with acute on chronic hypoxic respiratory failure--she was tested + on 12/4 -reports on home o2 at 2liters for a few years  --IV steroids and bronchodilators as ordered, - -Paxlovid on 07/13/22-07/18/22 -increased oxygen requirement on 12/7, repeat cxr showed worsening of pneumonia,  started back on abx, continue steroids,  prn iv lasix -discussed with daughter [name] and HPOA [name] may consider comfort measures if she deteriorate, they are in agreement     IDDM2- -episodes of hypoglycemia , insulin dose decrease, continue to adjust insulin dose as needed -currently on steroids    HTN- bp stable    chronic anemia- Hgb is down to 7.1 from 9.8 - no bleeding concerns at this time monitor closely -Repeat CBC  on 07/15/22---- transfuse if Hgb less than 7  Dementia and depression--- continue trazodone as needed, continue Zoloft 75 mg daily and Seroquel 12.5 mg No agitation, no oriented to time ,but to place and person      Recurrent Falls--- at baseline patient ambulates with a walker from ALF -presents with  a mechanical fall- -CT head and CT C-spine without acute findings PTA pt lived at [facility] ALF and did very poorly, patient has significant limitations with mobility related ADLs- this patient needs to continue to be monitored in the hospital until a SNF bed is obtained as she is not safe to go home with her current physcical limitations -daughter desires palliative care to follow patient at snf if able to discharge     Dispo: The patient is from: ALF                         Anticipated d/c date is:  may need to transition to full comfort measures if she deteriorates                  Code Status :  -  Code Status: DNR , verified with family   Family Communication: spoke with daughter [name] [phone] on 12/6 , 12/7, 12/8, 12/9 [name] on 12/8    DVT Prophylaxis  :   - SCDs   heparin injection 5,000 Units Start: 07/10/22 1800 SCDs Start: 07/10/22 1559 Place TED hose Start: 07/10/22 1559   Lab Results  Component Value Date  PLT 175 07/18/2022    Inpatient Medications  Scheduled Meds:  dextromethorphan-guaiFENesin  1 tablet Oral BID   doxycycline  100 mg Oral Q12H   feeding supplement (GLUCERNA SHAKE)  237 mL Oral TID BM   heparin  5,000 Units Subcutaneous Q8H   insulin aspart  0-15 Units Subcutaneous TID WC   insulin aspart  0-5 Units Subcutaneous QHS   insulin glargine-yfgn  16 Units Subcutaneous QHS   ipratropium-albuterol  3 mL Nebulization TID   methocarbamol  500 mg Oral TID   methylPREDNISolone (SOLU-MEDROL) injection  40 mg Intravenous Q24H   multivitamin with minerals  1 tablet Oral Daily   nirmatrelvir/ritonavir EUA (renal dosing)  2 tablet  Oral BID   polyethylene glycol  17 g Oral Daily   QUEtiapine  12.5 mg Oral QHS   senna-docusate  2 tablet Oral QHS   sertraline  75 mg Oral Daily   sodium chloride flush  3 mL Intravenous Q12H   sodium chloride flush  3 mL Intravenous Q12H   Continuous Infusions:  sodium chloride Stopped (07/13/22 2148)   PRN Meds:.sodium chloride, acetaminophen **OR** acetaminophen, acetaminophen, albuterol, ALPRAZolam, bisacodyl, guaiFENesin-dextromethorphan, hydrALAZINE, ipratropium-albuterol, ondansetron **OR** ondansetron (ZOFRAN) IV, mouth rinse, oxyCODONE, sodium chloride flush, traZODone   Anti-infectives (From admission, onward)    Start     Dose/Rate Route Frequency Ordered Stop   07/17/22 1445  doxycycline (VIBRA-TABS) tablet 100 mg        100 mg Oral Every 12 hours 07/17/22 1356     07/16/22 1730  cefTRIAXone (ROCEPHIN) 1 g in sodium chloride 0.9 % 100 mL IVPB  Status:  Discontinued        1 g 200 mL/hr over 30 Minutes Intravenous Every 24 hours 07/16/22 1638 07/17/22 1356   07/15/22 1430  cefTRIAXone (ROCEPHIN) 1 g in sodium chloride 0.9 % 100 mL IVPB        1 g 200 mL/hr over 30 Minutes Intravenous  Once 07/15/22 1342 07/15/22 1428   07/13/22 2200  nirmatrelvir/ritonavir EUA (PAXLOVID) 3 tablet  Status:  Discontinued        3 tablet Oral 2 times daily 07/13/22 1541 07/13/22 1550   07/13/22 1700  nirmatrelvir/ritonavir EUA (renal dosing) (PAXLOVID) 2 tablet        2 tablet Oral 2 times daily 07/13/22 1550 07/18/22 2159   07/13/22 0000  cephALEXin (KEFLEX) 250 MG capsule        250 mg Oral 3 times daily 07/13/22 1401 07/16/22 2359   07/13/22 0000  azithromycin (ZITHROMAX) 500 MG tablet        500 mg Oral Daily 07/13/22 1433 07/16/22 2359   07/11/22 1200  cefTRIAXone (ROCEPHIN) 1 g in sodium chloride 0.9 % 100 mL IVPB        1 g 200 mL/hr over 30 Minutes Intravenous Every 24 hours 07/10/22 1555 07/15/22 2359   07/10/22 1930  azithromycin (ZITHROMAX) 500 mg in sodium chloride 0.9 % 250  mL IVPB        500 mg 250 mL/hr over 60 Minutes Intravenous Every 24 hours 07/10/22 1917 07/14/22 1845   07/10/22 1300  cefTRIAXone (ROCEPHIN) 2 g in sodium chloride 0.9 % 100 mL IVPB        2 g 200 mL/hr over 30 Minutes Intravenous  Once 07/10/22 1255 07/10/22 1336        Subjective:  She does not appear to get any better or any worse Remain on HFNC, she is very weak but does not appear in  acute distress  I did not hear cough, there is no fever, Her  oral intake if poor     Objective: Vitals:   07/17/22 1826 07/17/22 2130 07/18/22 0418 07/18/22 1525  BP:  (!) 124/50 107/79 (!) 130/51  Pulse:  68 66 73  Resp:  '20 20 20  '$ Temp:  97.8 F (36.6 C) 99.9 F (37.7 C) 98 F (36.7 C)  TempSrc:  Oral Oral Oral  SpO2: 90% 94% 97% 94%  Weight:      Height:        Intake/Output Summary (Last 24 hours) at 07/18/2022 1725 Last data filed at 07/18/2022 1000 Gross per 24 hour  Intake 243 ml  Output 950 ml  Net -707 ml   Filed Weights   07/10/22 0752 07/10/22 2028  Weight: 81.6 kg 81 kg    Physical Exam  Gen:- Awake Alert, frail and elderly, chronically ill-appearing, hard of hearing  HEENT:- [place].AT, No sclera icterus, HOH Nose- [place] 2L/min Neck-Supple Neck,No JVD,.  Lungs-diminished, no wheezing CV- S1, S2 normal, regular  Abd-  +ve B.Sounds, Abd Soft, No tenderness,    Extremity - pedal pulses present  Psych-affect is appropriate, oriented x2, not to time Neuro-generalized weakness, no new focal deficits, no tremors -Skin - warm, dry, left elbow skin tear MSK-discomfort with range of motion of left lower extremity and hips  Data Reviewed: I have personally reviewed following labs and imaging studies  CBC: Recent Labs  Lab 07/13/22 0858 07/15/22 0713 07/16/22 1002 07/17/22 0535 07/18/22 0501  WBC 14.0* 9.6 14.8* 10.2 12.1*  NEUTROABS  --   --   --  9.0* 10.8*  HGB 7.1* 7.0* 7.6* 7.2* 7.4*  HCT 22.1* 21.6* 23.5* 22.6* 23.4*  MCV 90.9 89.3 89.7 89.0 89.3  PLT 119*  115* 137* 158 242   Basic Metabolic Panel: Recent Labs  Lab 07/13/22 0858 07/14/22 0604 07/16/22 1002 07/17/22 0535 07/18/22 0501  NA 138 138 134* 139 141  K 3.7 3.8 4.1 3.5 3.5  CL 106 105 102 104 104  CO2 '26 27 24 26 27  '$ GLUCOSE 76 84 197* 214* 178*  BUN 25* 23 38* 48* 58*  CREATININE 0.86 0.87 1.17* 1.33* 1.35*  CALCIUM 7.7* 7.8* 7.6* 7.4* 7.4*  MG  --   --  2.3 2.4  --   PHOS  --   --   --  4.5  --    GFR: Estimated Creatinine Clearance: 26.2 mL/min (A) (by C-G formula based on SCr of 1.35 mg/dL (H)).  Recent Results (from the past 240 hour(s))  Urine Culture     Status: Abnormal   Collection Time: 07/10/22 11:37 AM   Specimen: Urine, Clean Catch  Result Value Ref Range Status   Specimen Description   Final    URINE, CLEAN CATCH Performed at [facility], [address] [place], [place] [address]    Special Requests   Final    NONE Performed at [facility], [address], [place], [place] [address]    Culture >=100,000 COLONIES/mL KLEBSIELLA PNEUMONIAE (A)  Final   Report Status 07/13/2022 FINAL  Final   Organism ID, Bacteria KLEBSIELLA PNEUMONIAE (A)  Final      Susceptibility   Klebsiella pneumoniae - MIC*    AMPICILLIN >=32 RESISTANT Resistant     CEFAZOLIN <=4 SENSITIVE Sensitive     CEFEPIME <=0.12 SENSITIVE Sensitive     CEFTRIAXONE <=0.25 SENSITIVE Sensitive     CIPROFLOXACIN <=0.25 SENSITIVE Sensitive  GENTAMICIN <=1 SENSITIVE Sensitive     IMIPENEM <=0.25 SENSITIVE Sensitive     NITROFURANTOIN <=16 SENSITIVE Sensitive     TRIMETH/SULFA <=20 SENSITIVE Sensitive     AMPICILLIN/SULBACTAM 4 SENSITIVE Sensitive     PIP/TAZO <=4 SENSITIVE Sensitive     * >=100,000 COLONIES/mL KLEBSIELLA PNEUMONIAE  SARS Coronavirus 2 by RT PCR (hospital order, performed in [place] hospital lab) *cepheid single result test* Anterior Nasal Swab     Status: Abnormal   Collection Time: 07/13/22  1:50 PM   Specimen: Anterior Nasal Swab  Result Value Ref Range Status    SARS Coronavirus 2 by RT PCR POSITIVE (A) NEGATIVE Final    Comment: (NOTE) SARS-CoV-2 target nucleic acids are DETECTED  SARS-CoV-2 RNA is generally detectable in upper respiratory specimens  during the acute phase of infection.  Positive results are indicative  of the presence of the identified virus, but do not rule out bacterial infection or co-infection with other pathogens not detected by the test.  Clinical correlation with patient history and  other diagnostic information is necessary to determine patient infection status.  The expected result is negative.  Fact Sheet for Patients:   https://www.patel.info/   Fact Sheet for Healthcare Providers:   https://hall.com/    This test is not yet approved or cleared by the [place] FDA and  has been authorized for detection and/or diagnosis of SARS-CoV-2 by FDA under an Emergency Use Authorization (EUA).  This EUA will remain in effect (meaning this test can be used) for the duration of  the COVID-19 declaration under Section 564(b)(1)  of the Act, 21 U.S.C. section 360-bbb-3(b)(1), unless the authorization is terminated or revoked sooner.   Performed at [facility], [address], [place], [place] [address]    Scheduled Meds:  dextromethorphan-guaiFENesin  1 tablet Oral BID   doxycycline  100 mg Oral Q12H   feeding supplement (GLUCERNA SHAKE)  237 mL Oral TID BM   heparin  5,000 Units Subcutaneous Q8H   insulin aspart  0-15 Units Subcutaneous TID WC   insulin aspart  0-5 Units Subcutaneous QHS   insulin glargine-yfgn  16 Units Subcutaneous QHS   ipratropium-albuterol  3 mL Nebulization TID   methocarbamol  500 mg Oral TID   methylPREDNISolone (SOLU-MEDROL) injection  40 mg Intravenous Q24H   multivitamin with minerals  1 tablet Oral Daily   nirmatrelvir/ritonavir EUA (renal dosing)  2 tablet Oral BID   polyethylene glycol  17 g Oral Daily   QUEtiapine  12.5 mg Oral QHS    senna-docusate  2 tablet Oral QHS   sertraline  75 mg Oral Daily   sodium chloride flush  3 mL Intravenous Q12H   sodium chloride flush  3 mL Intravenous Q12H   Continuous Infusions:  sodium chloride Stopped (07/13/22 2148)    LOS: 8 days   Florencia Reasons M.D PhD FACP on 07/18/2022 at 5:25 PM  Go to [email or web address] - for contact info  [facility] - Office  [phone]  If 7PM-7AM, please contact night-coverage [email or web address] 07/18/2022, 5:25 PM

## 2022-07-18 NOTE — Progress Notes (Signed)
Patients saturation at 86 on 15 lpm Increased to 96 on neb treatment, Lung sounds are decreased no wheezes with respirations at 20. Cough is congested.

## 2022-07-18 NOTE — Progress Notes (Addendum)
Patients sats are 85-88% on 10 HFNC. Per MD she stated to put her between 10-15 HFNC until she shows signs of deterioration she will speak to family to transition to comfort care.  Bumped patients oxygen up to 15L HF. Asked charge nurse to assess patient. Patients lips are blue tinged, using accessory muscles to breathe, and sats are 86-90% on 15 HFNC. Notified MD

## 2022-07-18 NOTE — Progress Notes (Signed)
Pts o2 sats were coming down to 80% - 84%  with 10L HFNC. Notified MD and she came to assess the patient and stated she would talk to the daughters and family. She said the family agreed to transition to comfort measures when she shows signs of deterioration. Notified upcoming nurse and charge nurse. Patient was bumped back up to 15 HFNC and sats are 91%

## 2022-07-18 NOTE — TOC Progression Note (Signed)
Transition of Care [facility]) - Progression Note    Patient Details  Name: Sheena [name] MRN: [id number] Date of Birth: [birth year]  Transition of [facility] CM/SW Contact  Joanne Chars, LCSW Phone Number: 07/18/2022, 9:26 AM  Clinical Narrative:   SNF auth approved in [place]: [id number], [id number], 5 days: 12/8-12/12.    Expected Discharge Plan: [place] Barriers to Discharge: Continued Medical Work up  Expected Discharge Plan and Services Expected Discharge Plan: [place] In-house Referral: Clinical Social Work Discharge Planning Services: CM Consult Post Acute Care Choice: [place] Living arrangements for the past 2 months: [place] Expected Discharge Date: 07/13/22                                     Social Determinants of Health (SDOH) Interventions Housing Interventions: Intervention Not Indicated  Readmission Risk Interventions    07/13/2022   11:23 AM  Readmission Risk Prevention Plan  Transportation Screening Complete  PCP or Specialist Appt within 3-5 Days Complete  HRI or [place] Complete  Social Work Consult for [place] Planning/Counseling Complete  Palliative Care Screening Not Applicable  Medication Review Press photographer) Complete

## 2022-07-19 DIAGNOSIS — S32592A Other specified fracture of left pubis, initial encounter for closed fracture: Secondary | ICD-10-CM | POA: Diagnosis not present

## 2022-07-19 LAB — CBC WITH DIFFERENTIAL/PLATELET
Abs Immature Granulocytes: 0.24 10*3/uL — ABNORMAL HIGH (ref 0.00–0.07)
Basophils Absolute: 0 10*3/uL (ref 0.0–0.1)
Basophils Relative: 0 %
Eosinophils Absolute: 0 10*3/uL (ref 0.0–0.5)
Eosinophils Relative: 0 %
HCT: 23.5 % — ABNORMAL LOW (ref 36.0–46.0)
Hemoglobin: 7.5 g/dL — ABNORMAL LOW (ref 12.0–15.0)
Immature Granulocytes: 2 %
Lymphocytes Relative: 4 %
Lymphs Abs: 0.5 10*3/uL — ABNORMAL LOW (ref 0.7–4.0)
MCH: 28.8 pg (ref 26.0–34.0)
MCHC: 31.9 g/dL (ref 30.0–36.0)
MCV: 90.4 fL (ref 80.0–100.0)
Monocytes Absolute: 0.4 10*3/uL (ref 0.1–1.0)
Monocytes Relative: 4 %
Neutro Abs: 11.1 10*3/uL — ABNORMAL HIGH (ref 1.7–7.7)
Neutrophils Relative %: 90 %
Platelets: 185 10*3/uL (ref 150–400)
RBC: 2.6 MIL/uL — ABNORMAL LOW (ref 3.87–5.11)
RDW: 17.1 % — ABNORMAL HIGH (ref 11.5–15.5)
WBC: 12.3 10*3/uL — ABNORMAL HIGH (ref 4.0–10.5)
nRBC: 0.2 % (ref 0.0–0.2)

## 2022-07-19 LAB — BASIC METABOLIC PANEL
Anion gap: 9 (ref 5–15)
BUN: 63 mg/dL — ABNORMAL HIGH (ref 8–23)
CO2: 26 mmol/L (ref 22–32)
Calcium: 7.5 mg/dL — ABNORMAL LOW (ref 8.9–10.3)
Chloride: 105 mmol/L (ref 98–111)
Creatinine, Ser: 1.25 mg/dL — ABNORMAL HIGH (ref 0.44–1.00)
GFR, Estimated: 39 mL/min — ABNORMAL LOW (ref >60)
Glucose, Bld: 227 mg/dL — ABNORMAL HIGH (ref 70–99)
Potassium: 3.7 mmol/L (ref 3.5–5.1)
Sodium: 140 mmol/L (ref 135–145)

## 2022-07-19 LAB — GLUCOSE, CAPILLARY
Glucose-Capillary: 211 mg/dL — ABNORMAL HIGH (ref 70–99)
Glucose-Capillary: 271 mg/dL — ABNORMAL HIGH (ref 70–99)
Glucose-Capillary: 207 mg/dL — ABNORMAL HIGH (ref 70–99)
Glucose-Capillary: 288 mg/dL — ABNORMAL HIGH (ref 70–99)

## 2022-07-19 LAB — MRSA NEXT GEN BY PCR, NASAL: MRSA by PCR Next Gen: DETECTED — AB

## 2022-07-19 LAB — PROCALCITONIN: Procalcitonin: <0.1 ng/mL

## 2022-07-19 LAB — C-REACTIVE PROTEIN: CRP: 7.8 mg/dL — ABNORMAL HIGH (ref <1.0)

## 2022-07-19 MED ORDER — POLYETHYLENE GLYCOL 3350 17 G PO PACK
17.0000 g | PACK | Freq: Two times a day (BID) | ORAL | Status: DC
  Administered 2022-07-19 – 2022-07-23 (×8): 17 g via ORAL
  Filled 2022-07-19 (×8): qty 1

## 2022-07-19 MED ORDER — MUPIROCIN 2 % EX OINT
1.0000 | TOPICAL_OINTMENT | Freq: Two times a day (BID) | CUTANEOUS | Status: DC
  Administered 2022-07-19 – 2022-07-23 (×8): 1 via NASAL
  Filled 2022-07-19 (×2): qty 22

## 2022-07-19 MED ORDER — BISACODYL 10 MG RE SUPP
10.0000 mg | Freq: Every day | RECTAL | Status: AC
  Filled 2022-07-19: qty 1

## 2022-07-19 MED ORDER — SENNOSIDES-DOCUSATE SODIUM 8.6-50 MG PO TABS
1.0000 | ORAL_TABLET | Freq: Two times a day (BID) | ORAL | Status: DC
  Administered 2022-07-19 – 2022-07-23 (×9): 1 via ORAL
  Filled 2022-07-19 (×10): qty 1

## 2022-07-19 NOTE — Progress Notes (Signed)
Patient had  an uneventful day. Patient was up in the bedside chair via the sky lift after extensive redirecting. Patient now on 7L HF O2 via [place]. Patient was medicated x2 for pain and noted effective. Patient tolerated breakfast and not so much for lunch and dinner. But ate snacks between meals. Patient is now back in bed, HOB elevated to 30 degree, bed alarm on and functioning, call bell with in reach. Patient is watching TV. Spoke to patient's daughter [name] and updated.

## 2022-07-19 NOTE — Progress Notes (Signed)
Date and time results received: 07/19/22 1722 (use smartphrase ".now" to insert current time)  Test: MRSA nasal Critical Value: +  Name of Provider Notified: Dr. Erlinda Hong  Orders Received? Or Actions Taken?: +MRSA nasal swab protocol implemented.

## 2022-07-19 NOTE — Progress Notes (Signed)
PROGRESS NOTE     Sheena [name], is a 88 y.o. female, DOB - 1924-03-11, RCB:[id number]  Admit date - 07/10/2022   Admitting Physician Courage Denton Brick, MD  Outpatient Primary MD for the patient is [name], [name], MD  LOS - 9  Chief Complaint  Patient presents with   Fall          Brief Narrative:   88 y.o. female with pmhx relevant for depression/demention, HTN , DM2.Marland Kitchen  COPD admitted on 07/10/2022 from high [facility] facility with Lt hip pain after mechanical fall and found to have pubic rami fractures, rib fracture, also found to + covid, worsening hypoxia   @'@please'$  call HPOA grandson [name] at [phone] and daughter sharon at [phone] if patient deteriorate, they are both aware the possibility and agreed to be called at night if [name] deteriorate. Family agreed to transition to full comfort measures if [name] deteriorate.@@  A/p   COVID-19 infection with acute on chronic hypoxic respiratory failure--she was tested + on 12/4 -reports on home o2 at [facility] for a few years  --IV steroids and bronchodilators as ordered, - -Paxlovid on 07/13/22-07/18/22 -increased oxygen requirement on 12/7, repeat cxr showed worsening of pneumonia,  started back on abx, continue steroids,  prn iv lasix -o2 requirement holding steady around 9-10 liters for the last few days, she does not appear in significant respiratory distress, crp slowing coming down -discussed with daughter [name] and HPOA [name] may consider comfort measures if she deteriorate, they are in agreement   Klebsiella UTI ,POA -finished treatment with IV Rocephin -last dose 07/15/2022    Acute Left pubic rami fractures----after mechanical fall (POA) -Outpatient follow-up with orthopedic  --Pain control appears adequate -SNF placement   Lt sided rib fractures (POA) from the fall, does not appear in pain      IDDM2- -episodes of hypoglycemia , insulin dose decrease, continue to adjust insulin dose  as needed -currently on steroids    HTN- bp stable    chronic anemia- Hgb is down to 7.1 from 9.8 - no bleeding concerns at this time monitor closely -Repeat CBC on 07/15/22---- transfuse if Hgb less than 7  Dementia and depression--- continue trazodone as needed, continue Zoloft 75 mg daily and Seroquel 12.5 mg No agitation, no oriented to time ,but to place and person      Recurrent Falls--- at baseline patient ambulates with a walker from ALF -presents with  a mechanical fall- -CT head and CT C-spine without acute findings PTA pt lived at [facility] ALF and did very poorly, patient has significant limitations with mobility related ADLs- this patient needs to continue to be monitored in the hospital until a SNF bed is obtained as she is not safe to go home with her current physcical limitations -daughter desires palliative care to follow patient at snf if able to discharge     Dispo: The patient is from: ALF                         Anticipated d/c date is:  may need to transition to full comfort measures if she deteriorates                  Code Status :  -  Code Status: DNR , verified with family   Family Communication: spoke with daughter [name] [phone] on 12/6 , 12/7, 12/8, 12/9, 12/10 [name] on 12/8    DVT Prophylaxis  :   -  SCDs   heparin injection 5,000 Units Start: 07/10/22 1800 SCDs Start: 07/10/22 1559 Place TED hose Start: 07/10/22 1559   Lab Results  Component Value Date   PLT 185 07/19/2022    Inpatient Medications  Scheduled Meds:  Chlorhexidine Gluconate Cloth  6 each Topical Daily   dextromethorphan-guaiFENesin  1 tablet Oral BID   doxycycline  100 mg Oral Q12H   feeding supplement (GLUCERNA SHAKE)  237 mL Oral TID BM   heparin  5,000 Units Subcutaneous Q8H   insulin aspart  0-15 Units Subcutaneous TID WC   insulin aspart  0-5 Units Subcutaneous QHS   insulin glargine-yfgn  16 Units Subcutaneous QHS   ipratropium-albuterol  3  mL Nebulization TID   methocarbamol  500 mg Oral TID   methylPREDNISolone (SOLU-MEDROL) injection  40 mg Intravenous Q24H   multivitamin with minerals  1 tablet Oral Daily   polyethylene glycol  17 g Oral Daily   QUEtiapine  12.5 mg Oral QHS   senna-docusate  2 tablet Oral QHS   sertraline  75 mg Oral Daily   sodium chloride flush  3 mL Intravenous Q12H   sodium chloride flush  3 mL Intravenous Q12H   Continuous Infusions:  sodium chloride Stopped (07/13/22 2148)   PRN Meds:.sodium chloride, acetaminophen **OR** acetaminophen, acetaminophen, albuterol, ALPRAZolam, bisacodyl, guaiFENesin-dextromethorphan, hydrALAZINE, ipratropium-albuterol, morphine injection, ondansetron **OR** ondansetron (ZOFRAN) IV, mouth rinse, oxyCODONE, sodium chloride flush, traZODone   Anti-infectives (From admission, onward)    Start     Dose/Rate Route Frequency Ordered Stop   07/17/22 1445  doxycycline (VIBRA-TABS) tablet 100 mg        100 mg Oral Every 12 hours 07/17/22 1356     07/16/22 1730  cefTRIAXone (ROCEPHIN) 1 g in sodium chloride 0.9 % 100 mL IVPB  Status:  Discontinued        1 g 200 mL/hr over 30 Minutes Intravenous Every 24 hours 07/16/22 1638 07/17/22 1356   07/15/22 1430  cefTRIAXone (ROCEPHIN) 1 g in sodium chloride 0.9 % 100 mL IVPB        1 g 200 mL/hr over 30 Minutes Intravenous  Once 07/15/22 1342 07/15/22 1428   07/13/22 2200  nirmatrelvir/ritonavir EUA (PAXLOVID) 3 tablet  Status:  Discontinued        3 tablet Oral 2 times daily 07/13/22 1541 07/13/22 1550   07/13/22 1700  nirmatrelvir/ritonavir EUA (renal dosing) (PAXLOVID) 2 tablet        2 tablet Oral 2 times daily 07/13/22 1550 07/18/22 2159   07/13/22 0000  cephALEXin (KEFLEX) 250 MG capsule        250 mg Oral 3 times daily 07/13/22 1401 07/16/22 2359   07/13/22 0000  azithromycin (ZITHROMAX) 500 MG tablet        500 mg Oral Daily 07/13/22 1433 07/16/22 2359   07/11/22 1200  cefTRIAXone (ROCEPHIN) 1 g in sodium chloride 0.9 %  100 mL IVPB        1 g 200 mL/hr over 30 Minutes Intravenous Every 24 hours 07/10/22 1555 07/15/22 2359   07/10/22 1930  azithromycin (ZITHROMAX) 500 mg in sodium chloride 0.9 % 250 mL IVPB        500 mg 250 mL/hr over 60 Minutes Intravenous Every 24 hours 07/10/22 1917 07/14/22 1845   07/10/22 1300  cefTRIAXone (ROCEPHIN) 2 g in sodium chloride 0.9 % 100 mL IVPB        2 g 200 mL/hr over 30 Minutes Intravenous  Once 07/10/22 1255 07/10/22 1336  Subjective:  She does not appear to get any better or any worse Remain on HFNC, she is very weak but does not appear in acute distress  I did not hear cough, there is no fever, Her  oral intake if poor     Objective: Vitals:   07/19/22 1300 07/19/22 1400 07/19/22 1500 07/19/22 1518  BP: (!) 128/41 (!) 116/37    Pulse: 73 74 80   Resp: 16 16 (!) 21   Temp:      TempSrc:      SpO2: 100% 100% 97% 97%  Weight:      Height:        Intake/Output Summary (Last 24 hours) at 07/19/2022 1710 Last data filed at 07/19/2022 1500 Gross per 24 hour  Intake 1077 ml  Output 1570 ml  Net -493 ml   Filed Weights   07/10/22 0752 07/10/22 2028 07/19/22 0421  Weight: 81.6 kg 81 kg 76.8 kg    Physical Exam  Gen:- Awake Alert, frail and elderly, chronically ill-appearing, hard of hearing  HEENT:- [place].AT, No sclera icterus, HOH Nose- [place] 2L/min Neck-Supple Neck,No JVD,.  Lungs-diminished, no wheezing CV- S1, S2 normal, regular  Abd-  +ve B.Sounds, Abd Soft, No tenderness,    Extremity - pedal pulses present  Psych-affect is appropriate, oriented x2, not to time Neuro-generalized weakness, no new focal deficits, no tremors -Skin - warm, dry, left elbow skin tear MSK-discomfort with range of motion of left lower extremity and hips  Data Reviewed: I have personally reviewed following labs and imaging studies  CBC: Recent Labs  Lab 07/15/22 0713 07/16/22 1002 07/17/22 0535 07/18/22 0501 07/19/22 0333  WBC 9.6 14.8* 10.2 12.1*  12.3*  NEUTROABS  --   --  9.0* 10.8* 11.1*  HGB 7.0* 7.6* 7.2* 7.4* 7.5*  HCT 21.6* 23.5* 22.6* 23.4* 23.5*  MCV 89.3 89.7 89.0 89.3 90.4  PLT 115* 137* 158 175 244   Basic Metabolic Panel: Recent Labs  Lab 07/14/22 0604 07/16/22 1002 07/17/22 0535 07/18/22 0501 07/19/22 0333  NA 138 134* 139 141 140  K 3.8 4.1 3.5 3.5 3.7  CL 105 102 104 104 105  CO2 '27 24 26 27 26  '$ GLUCOSE 84 197* 214* 178* 227*  BUN 23 38* 48* 58* 63*  CREATININE 0.87 1.17* 1.33* 1.35* 1.25*  CALCIUM 7.8* 7.6* 7.4* 7.4* 7.5*  MG  --  2.3 2.4  --   --   PHOS  --   --  4.5  --   --    GFR: Estimated Creatinine Clearance: 27.6 mL/min (A) (by C-G formula based on SCr of 1.25 mg/dL (H)).  Recent Results (from the past 240 hour(s))  Urine Culture     Status: Abnormal   Collection Time: 07/10/22 11:37 AM   Specimen: Urine, Clean Catch  Result Value Ref Range Status   Specimen Description   Final    URINE, CLEAN CATCH Performed at [facility], [address], [place], [place] [address]    Special Requests   Final    NONE Performed at [facility], [address], [place], [place] [address]    Culture >=100,000 COLONIES/mL KLEBSIELLA PNEUMONIAE (A)  Final   Report Status 07/13/2022 FINAL  Final   Organism ID, Bacteria KLEBSIELLA PNEUMONIAE (A)  Final      Susceptibility   Klebsiella pneumoniae - MIC*    AMPICILLIN >=32 RESISTANT Resistant     CEFAZOLIN <=4 SENSITIVE Sensitive     CEFEPIME <=0.12 SENSITIVE Sensitive  CEFTRIAXONE <=0.25 SENSITIVE Sensitive     CIPROFLOXACIN <=0.25 SENSITIVE Sensitive     GENTAMICIN <=1 SENSITIVE Sensitive     IMIPENEM <=0.25 SENSITIVE Sensitive     NITROFURANTOIN <=16 SENSITIVE Sensitive     TRIMETH/SULFA <=20 SENSITIVE Sensitive     AMPICILLIN/SULBACTAM 4 SENSITIVE Sensitive     PIP/TAZO <=4 SENSITIVE Sensitive     * >=100,000 COLONIES/mL KLEBSIELLA PNEUMONIAE  SARS Coronavirus 2 by RT PCR (hospital order, performed in [place] hospital lab) *cepheid single  result test* Anterior Nasal Swab     Status: Abnormal   Collection Time: 07/13/22  1:50 PM   Specimen: Anterior Nasal Swab  Result Value Ref Range Status   SARS Coronavirus 2 by RT PCR POSITIVE (A) NEGATIVE Final    Comment: (NOTE) SARS-CoV-2 target nucleic acids are DETECTED  SARS-CoV-2 RNA is generally detectable in upper respiratory specimens  during the acute phase of infection.  Positive results are indicative  of the presence of the identified virus, but do not rule out bacterial infection or co-infection with other pathogens not detected by the test.  Clinical correlation with patient history and  other diagnostic information is necessary to determine patient infection status.  The expected result is negative.  Fact Sheet for Patients:   https://www.patel.info/   Fact Sheet for Healthcare Providers:   https://hall.com/    This test is not yet approved or cleared by the [place] FDA and  has been authorized for detection and/or diagnosis of SARS-CoV-2 by FDA under an Emergency Use Authorization (EUA).  This EUA will remain in effect (meaning this test can be used) for the duration of  the COVID-19 declaration under Section 564(b)(1)  of the Act, 21 U.S.C. section 360-bbb-3(b)(1), unless the authorization is terminated or revoked sooner.   Performed at [facility], [address] [place], [place] [address]    Scheduled Meds:  Chlorhexidine Gluconate Cloth  6 each Topical Daily   dextromethorphan-guaiFENesin  1 tablet Oral BID   doxycycline  100 mg Oral Q12H   feeding supplement (GLUCERNA SHAKE)  237 mL Oral TID BM   heparin  5,000 Units Subcutaneous Q8H   insulin aspart  0-15 Units Subcutaneous TID WC   insulin aspart  0-5 Units Subcutaneous QHS   insulin glargine-yfgn  16 Units Subcutaneous QHS   ipratropium-albuterol  3 mL Nebulization TID   methocarbamol  500 mg Oral TID   methylPREDNISolone (SOLU-MEDROL) injection   40 mg Intravenous Q24H   multivitamin with minerals  1 tablet Oral Daily   polyethylene glycol  17 g Oral Daily   QUEtiapine  12.5 mg Oral QHS   senna-docusate  2 tablet Oral QHS   sertraline  75 mg Oral Daily   sodium chloride flush  3 mL Intravenous Q12H   sodium chloride flush  3 mL Intravenous Q12H   Continuous Infusions:  sodium chloride Stopped (07/13/22 2148)    LOS: 9 days   Florencia Reasons M.D PhD FACP on 07/19/2022 at 5:10 PM  Go to [email or web address] - for contact info  [facility] - Office  [phone]  If 7PM-7AM, please contact night-coverage [email or web address] 07/19/2022, 5:10 PM

## 2022-07-20 ENCOUNTER — Inpatient Hospital Stay (HOSPITAL_COMMUNITY): Payer: Medicare Other

## 2022-07-20 DIAGNOSIS — J449 Chronic obstructive pulmonary disease, unspecified: Secondary | ICD-10-CM | POA: Diagnosis not present

## 2022-07-20 DIAGNOSIS — I10 Essential (primary) hypertension: Secondary | ICD-10-CM | POA: Diagnosis not present

## 2022-07-20 DIAGNOSIS — S32592A Other specified fracture of left pubis, initial encounter for closed fracture: Secondary | ICD-10-CM | POA: Diagnosis not present

## 2022-07-20 DIAGNOSIS — N3000 Acute cystitis without hematuria: Secondary | ICD-10-CM

## 2022-07-20 DIAGNOSIS — Z66 Do not resuscitate: Secondary | ICD-10-CM | POA: Diagnosis not present

## 2022-07-20 DIAGNOSIS — Z794 Long term (current) use of insulin: Secondary | ICD-10-CM

## 2022-07-20 DIAGNOSIS — Z7189 Other specified counseling: Secondary | ICD-10-CM

## 2022-07-20 DIAGNOSIS — W19XXXA Unspecified fall, initial encounter: Secondary | ICD-10-CM

## 2022-07-20 DIAGNOSIS — U071 COVID-19: Secondary | ICD-10-CM

## 2022-07-20 DIAGNOSIS — D72829 Elevated white blood cell count, unspecified: Secondary | ICD-10-CM

## 2022-07-20 DIAGNOSIS — Z515 Encounter for palliative care: Secondary | ICD-10-CM | POA: Diagnosis not present

## 2022-07-20 DIAGNOSIS — J1282 Pneumonia due to coronavirus disease 2019: Secondary | ICD-10-CM

## 2022-07-20 DIAGNOSIS — E119 Type 2 diabetes mellitus without complications: Secondary | ICD-10-CM | POA: Diagnosis not present

## 2022-07-20 LAB — BASIC METABOLIC PANEL
Anion gap: 7 (ref 5–15)
BUN: 73 mg/dL — ABNORMAL HIGH (ref 8–23)
CO2: 28 mmol/L (ref 22–32)
Calcium: 7.7 mg/dL — ABNORMAL LOW (ref 8.9–10.3)
Chloride: 107 mmol/L (ref 98–111)
Creatinine, Ser: 1.28 mg/dL — ABNORMAL HIGH (ref 0.44–1.00)
GFR, Estimated: 38 mL/min — ABNORMAL LOW (ref >60)
Glucose, Bld: 274 mg/dL — ABNORMAL HIGH (ref 70–99)
Potassium: 4.3 mmol/L (ref 3.5–5.1)
Sodium: 142 mmol/L (ref 135–145)

## 2022-07-20 LAB — GLUCOSE, CAPILLARY
Glucose-Capillary: 197 mg/dL — ABNORMAL HIGH (ref 70–99)
Glucose-Capillary: 129 mg/dL — ABNORMAL HIGH (ref 70–99)
Glucose-Capillary: 167 mg/dL — ABNORMAL HIGH (ref 70–99)
Glucose-Capillary: 189 mg/dL — ABNORMAL HIGH (ref 70–99)

## 2022-07-20 LAB — MAGNESIUM: Magnesium: 2.9 mg/dL — ABNORMAL HIGH (ref 1.7–2.4)

## 2022-07-20 LAB — PHOSPHORUS: Phosphorus: 3.3 mg/dL (ref 2.5–4.6)

## 2022-07-20 MED ORDER — IPRATROPIUM-ALBUTEROL 0.5-2.5 (3) MG/3ML IN SOLN
3.0000 mL | Freq: Three times a day (TID) | RESPIRATORY_TRACT | Status: DC
  Administered 2022-07-20 – 2022-07-22 (×7): 3 mL via RESPIRATORY_TRACT
  Filled 2022-07-20 (×7): qty 3

## 2022-07-20 NOTE — Inpatient Diabetes Management (Signed)
Inpatient Diabetes Program Recommendations  AACE/ADA: New Consensus Statement on Inpatient Glycemic Control (2015)  Target Ranges:  Prepandial:   less than 140 mg/dL      Peak postprandial:   less than 180 mg/dL (1-2 hours)      Critically ill patients:  140 - 180 mg/dL   Lab Results  Component Value Date   GLUCAP 197 (H) 07/20/2022   HGBA1C 5.9 (H) 07/10/2022    Review of Glycemic Control  Latest Reference Range & Units 07/19/22 11:52 07/19/22 17:19 07/19/22 22:22 07/20/22 07:32  Glucose-Capillary 70 - 99 mg/dL 271 (H) 207 (H) 288 (H) 197 (H)  (H): Data is abnormally high  Current orders for Inpatient glycemic control: Semglee 16 units QHS, Novolog 0-15 units TID & HS Solumedrol 40 mg QD  Inpatient Diabetes Program Recommendations:    If appropriate and trends continue to exceed 250's mg/dL, could consider slight increase to Semglee- 18 units QHS.   Thanks, Bronson Curb, MSN, RNC-OB Diabetes Coordinator [phone] (8a-5p)

## 2022-07-20 NOTE — Consult Note (Signed)
Palliative Care Consult Note                                  Date: 07/20/2022   Patient Name: Sheena [name]  DOB: 03-03-1924  MRN: [id number]  Age / Sex: 88 y.o., female  PCP: Carrolyn Meiers, MD Referring Physician: Murlean Iba, MD  Reason for Consultation: Establishing goals of care  HPI/Patient Profile: 88 y.o. female  with past medical history of depression/demention, HTN , DM2.Marland Kitchen  COPD admitted on 07/10/2022 from [facility] with Lt hip pain after mechanical fall and found to have pubic rami fractures, rib fracture, also found to + covid, worsening hypoxia. During hospitalization she has had worsening respiratory status and increasing oxygen needs.  PMT was consulted for [place] conversations.  Past Medical History:  Diagnosis Date   Asthma    treated by Dr. Donneta Romberg   Depression    Diabetes mellitus, type 2 ([place])    H/O: hysterectomy 1973   fibroids   Hyperlipidemia    Hypertension    Left arm pain    Lumbago    Melanoma ([place])    left arm, resected, annual CXR's ordered   Syncope 08/2013   Suspected vagal-mediated    Subjective:   This NP Walden Field reviewed medical records, received report from team, assessed the patient and then meet at the patient's bedside to discuss diagnosis, prognosis, GOC, EOL wishes disposition and options.  I met with the patient at the bedside who is oriented x 1 and unable to have meaningful conversations.  I did call and speak with the patient's grandson Sheena [name] who is healthcare power of attorney.  I also attempted to call the patient's daughter Sheena [name] and left a voicemail for call back in order to update her.   Concept of Palliative Care was introduced as specialized medical care for people and their families living with serious illness.  If focuses on providing relief from the symptoms and stress of a serious illness.  The goal is to improve  quality of life for both the patient and the family. Values and goals of care important to patient and family were attempted to be elicited.  Created space and opportunity for patient  and family to explore thoughts and feelings regarding current medical situation   Natural trajectory and current clinical status were discussed. Questions and concerns addressed. Patient  encouraged to call with questions or concerns.    Patient/Family Understanding of Illness: Sheena [name] understands the patient fell and has a fracture of the pubic bone and 3 ribs on the left side.  Then she tested positive for COVID-19.  There is a Sheena [name] that she had COVID which caused weakness and subsequently her fall.  She is in the hospital currently secondary to [place] and has not been able to receive therapy due to her respiratory status.  She is struggling with oxygen needs currently.  We had further discussion on the clinical details of her acute situation.  Life Review: The patient has 2 daughters who live locally.  Her grandson/HCPOA Sheena [name] lives in [place].  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  She has a very strong faith and after retirement went to [employer] in [place] and got her degree.  Patient Values: Sheena [name] (she is [place] and attended [employer] in [place], [place]), family  Goals:  Continue current treatments for hopeful improvement, realistic to possibility of decline.  If the patient declines they are agreeable to focus on comfort care.  If no improvement over the coming days are agreeable to further discussions about care options.  Today's Discussion: In addition to discussions described above he had substantial discussion on multiple topics.  We again reviewed clinical status.  We discussed the importance of her faith and Sheena [name] has opted for a chaplaincy consult.  He states that she would likely very much appreciate if a chaplain could read some  Bible verses to her.  I did notify him that his grandma's x-ray looked slightly improved today but that she does remain on high oxygen needs (9 L/min) with some noted shortness of breath although her saturations are between 92 and 94%.  She is awake and alert oriented x 1 and thankfully not in pain.  He is grateful for this.  He asked what the plan is if her respiratory status improves.  I indicated that from what I can read if her respiratory status improved to the point where she can discharge from the hospital the plan is to discharge to skilled nursing facility/rehab stay for therapy and strengthening.  I explained that even without a fracture being this sick for this long and her age would result in substantial weakness and debility needing rehab.  He verbalized understanding.  He then asked what would happen if she does not get better but does not get worse.  I stated if she remains stable and requiring high oxygen needs that would in itself predict a ongoing decline as she would likely develop wounds, high risk for worsening pneumonia, clots, breakdown, malnutrition, etc.  At this point of ongoing high need but stable we will have further discussion about care options from full aggressive care to shift to comfort care and all parts in between.  He verbalized understanding.  At this point we agreed to take it a day to time, allow a little more time for outcomes to see how she does from a respiratory standpoint.  We will have further discussions on goals of care as needed and based on her clinical progress.  I provided emotional and general support through therapeutic listening, empathy, sharing of stories, and other techniques. I answered all questions and addressed all concerns to the best of my ability.  Per Preston's request I called his mother (the patient's daughter) to update her on the patient's status today.  The call went to voicemail and I left a message for call back.  ROS Limited due to  AMS Review of Systems  Constitutional:        Denies pain in general  Respiratory:  Positive for shortness of breath. Negative for cough.   Gastrointestinal:  Negative for abdominal pain, nausea and vomiting.    Objective:   Primary Diagnoses: Present on Admission:  Closed fracture of multiple pubic rami, left, initial encounter ([place])  COPD (chronic obstructive pulmonary disease) ([place])  Hypertension   Physical Exam Vitals and nursing note reviewed.  Constitutional:      General: She is sleeping. She is not in acute distress. HENT:     Head: Normocephalic and atraumatic.  Cardiovascular:     Rate and Rhythm: Normal rate and regular rhythm.  Pulmonary:     Effort: Pulmonary effort is normal. No respiratory distress.  Abdominal:     General: Abdomen is flat. Bowel sounds are normal.     Palpations: Abdomen is soft.  Skin:  General: Skin is warm and dry.  Neurological:     Mental Status: She is easily aroused. She is disoriented and confused.  Psychiatric:        Mood and Affect: Mood normal.        Behavior: Behavior normal.     Comments: Pleasantly conversational     Vital Signs:  BP (!) 138/40   Pulse 73   Temp 97.8 F (36.6 C) (Axillary)   Resp 18   Ht _0  (1.676 m)   Wt 76.8 kg   SpO2 100%   BMI 27.33 kg/m   Palliative Assessment/Data: 30%    Advanced Care Planning:   Existing Vynca/ACP Documentation: None  Primary Decision Maker: NEXT OF KIN  Code Status/Advance Care Planning: DNR  A discussion was had today regarding advanced directives. Concepts specific to code status, artifical feeding and hydration, continued IV antibiotics and rehospitalization was had.  The difference between a aggressive medical intervention path and a palliative comfort care path for this patient at this time was had.   Decisions/Changes to ACP: None today  Assessment & Plan:   Impression: 88 year old female status post fall with fractures likely requiring  rehab.  She tested positive for COVID-19 and developed pneumonia with worsening oxygen needs.  Her oxygen needs have been stable at 9 to 10 L/min for the past several days.  Family is aware of her predicament.  They seem amendable to comfort care if she deteriorates.  I confirmed with Sheena [name] (grandson/HCPOA) to continue current care, no escalation of care, remain DNR, further [place] conversations as the patient clinical progress evolves.  Overall prognosis is poor.  SUMMARY OF RECOMMENDATIONS   Remain DNR Continue current scope of care No escalation of care If deterioration, family seems agreeable to comfort care Further [place] conversations as clinical picture evolves Spiritual care consult for chaplaincy support We will follow-up in a couple days, please notify us for any substantial change in the meantime Continued emotional and spiritual support of the patient and family  Symptom Management:  Per primary team PMT is available to assist as needed  Prognosis:  Unable to determine  Discharge Planning:  To Be Determined   Discussed with: Patient's family, medical team, nursing team, spiritual care team, [facility] team    Thank you for allowing [place] to participate in the care of TANSY LOREK PMT will continue to support holistically.  Time Total: 105 min  Greater than 50%  of this time was spent counseling and coordinating care related to the above assessment and plan.  Signed by: Walden Field, NP Palliative Medicine Team  Team Phone # [phone] (Nights/Weekends)  07/20/2022, 4:25 PM

## 2022-07-20 NOTE — Progress Notes (Signed)
PROGRESS NOTE     Sheena [name], is a 88 y.o. female, DOB - Jan 01, 1924, FYB:[id number]  Admit date - 07/10/2022   Admitting Physician Courage Denton Brick, MD  Outpatient Primary MD for the patient is [name], [name], MD  LOS - 10  Chief Complaint  Patient presents with   Fall      Brief Narrative:   88 y.o. female with pmhx relevant for depression/demention, HTN , DM2.Marland Kitchen  COPD [facility] on 07/10/[facility] with Lt hip pain after mechanical fall and found to have pubic rami fractures, rib fracture, also found to + covid, worsening hypoxia   @'@please'$  call HPOA grandson [name] at [phone] and daughter sharon at [phone] if patient deteriorate, they are both aware the possibility and agreed to be called at night if Irmalee deteriorate. Family agreed to transition to full comfort measures if Dlisa deteriorate.@@  A/p   COVID-19 infection with acute on chronic hypoxic respiratory failure--she was tested + on 12/4 -reports on home o2 at [facility] for a few years  -IV steroids and bronchodilators as ordered -Paxlovid on 07/13/22-07/18/22 -increased oxygen requirement on 12/7, repeat cxr showed worsening of pneumonia,  started back on abx, continue steroids,  prn iv lasix -o2 requirement holding steady around 9-10 liters for the last few days, she does not appear in significant respiratory distress, crp slowing coming down -discussed with daughter Sheena [name] and HPOA [name] may consider comfort measures if she deteriorate, they are in agreement -If no meaningful improvement or recovery in next 24 hours of intensive medical therapy then would plan to transition to full comfort measures; palliative care team consulted to assist;    Klebsiella UTI ,POA -finished treatment with IV Rocephin -last dose 07/15/2022    Acute Left pubic rami fractures----after mechanical fall (POA) -Outpatient follow-up with orthopedic  -Pain control appears adequate -SNF  placement was the initial plan but may change depending on palliative discussions   Lt sided rib fractures (POA) from the fall, does not appear in pain    IDDM2 -episodes of hypoglycemia , insulin dose decrease, continue to adjust insulin dose as needed -currently on steroids CBG (last 3)  Recent Labs    07/20/22 0732 07/20/22 1114 07/20/22 1553  GLUCAP 197* 129* 167*     HTN- bp stable    chronic anemia- Hgb is down to 7.1 from 9.8 - no bleeding concerns at this time monitor closely -Repeat CBC on 07/15/22---- transfuse if Hgb less than 7  Dementia and depression--- continue trazodone as needed, continue Zoloft 75 mg daily and Seroquel 12.5 mg No agitation, no oriented to time ,but to place and person    Recurrent Falls--- at baseline patient ambulates with a walker from ALF -presents with  a mechanical fall- -CT head and CT C-spine without acute findings PTA pt lived at [facility]F and did very poorly, patient has significant limitations with mobility related ADLs- this patient needs to continue to be monitored in the hospital until a SNF bed is obtained as she is not safe to go home with her current physical limitations -daughter desires palliative care to follow patient at snf if able to discharge     Dispo: The patient is from: ALF                         Anticipated d/c date is:  may need to transition to full comfort measures if she deteriorates  Code Status :  -  Code Status: DNR , verified with family   Family Communication:  Dr. Erlinda Hong spoke with daughter [name] [phone] on 12/6 , 12/7, 12/8, 12/9, 12/10 [name] on 12/8    DVT Prophylaxis  :   - SCDs   heparin injection 5,000 Units Start: 07/10/22 1800 SCDs Start: 07/10/22 1559 Place TED hose Start: 07/10/22 1559   Lab Results  Component Value Date   PLT 185 07/19/2022    Inpatient Medications  Scheduled Meds:  bisacodyl  10 mg Rectal Daily   Chlorhexidine Gluconate  Cloth  6 each Topical Daily   dextromethorphan-guaiFENesin  1 tablet Oral BID   doxycycline  100 mg Oral Q12H   feeding supplement (GLUCERNA SHAKE)  237 mL Oral TID BM   heparin  5,000 Units Subcutaneous Q8H   insulin aspart  0-15 Units Subcutaneous TID WC   insulin aspart  0-5 Units Subcutaneous QHS   insulin glargine-yfgn  16 Units Subcutaneous QHS   ipratropium-albuterol  3 mL Nebulization TID   methocarbamol  500 mg Oral TID   methylPREDNISolone (SOLU-MEDROL) injection  40 mg Intravenous Q24H   multivitamin with minerals  1 tablet Oral Daily   mupirocin ointment  1 Application Nasal BID   polyethylene glycol  17 g Oral BID   QUEtiapine  12.5 mg Oral QHS   senna-docusate  1 tablet Oral BID   sertraline  75 mg Oral Daily   sodium chloride flush  3 mL Intravenous Q12H   sodium chloride flush  3 mL Intravenous Q12H   Continuous Infusions:  sodium chloride Stopped (07/13/22 2148)   PRN Meds:.sodium chloride, acetaminophen **OR** acetaminophen, acetaminophen, albuterol, ALPRAZolam, bisacodyl, guaiFENesin-dextromethorphan, hydrALAZINE, ipratropium-albuterol, morphine injection, ondansetron **OR** ondansetron (ZOFRAN) IV, mouth rinse, oxyCODONE, sodium chloride flush, traZODone   Anti-infectives (From admission, onward)    Start     Dose/Rate Route Frequency Ordered Stop   07/17/22 1445  doxycycline (VIBRA-TABS) tablet 100 mg        100 mg Oral Every 12 hours 07/17/22 1356     07/16/22 1730  cefTRIAXone (ROCEPHIN) 1 g in sodium chloride 0.9 % 100 mL IVPB  Status:  Discontinued        1 g 200 mL/hr over 30 Minutes Intravenous Every 24 hours 07/16/22 1638 07/17/22 1356   07/15/22 1430  cefTRIAXone (ROCEPHIN) 1 g in sodium chloride 0.9 % 100 mL IVPB        1 g 200 mL/hr over 30 Minutes Intravenous  Once 07/15/22 1342 07/15/22 1428   07/13/22 2200  nirmatrelvir/ritonavir EUA (PAXLOVID) 3 tablet  Status:  Discontinued        3 tablet Oral 2 times daily 07/13/22 1541 07/13/22 1550    07/13/22 1700  nirmatrelvir/ritonavir EUA (renal dosing) (PAXLOVID) 2 tablet        2 tablet Oral 2 times daily 07/13/22 1550 07/18/22 2159   07/13/22 0000  cephALEXin (KEFLEX) 250 MG capsule        250 mg Oral 3 times daily 07/13/22 1401 07/16/22 2359   07/13/22 0000  azithromycin (ZITHROMAX) 500 MG tablet        500 mg Oral Daily 07/13/22 1433 07/16/22 2359   07/11/22 1200  cefTRIAXone (ROCEPHIN) 1 g in sodium chloride 0.9 % 100 mL IVPB        1 g 200 mL/hr over 30 Minutes Intravenous Every 24 hours 07/10/22 1555 07/15/22 2359   07/10/22 1930  azithromycin (ZITHROMAX) 500 mg in sodium chloride 0.9 %  250 mL IVPB        500 mg 250 mL/hr over 60 Minutes Intravenous Every 24 hours 07/10/22 1917 07/14/22 1845   07/10/22 1300  cefTRIAXone (ROCEPHIN) 2 g in sodium chloride 0.9 % 100 mL IVPB        2 g 200 mL/hr over 30 Minutes Intravenous  Once 07/10/22 1255 07/10/22 1336        Subjective: Hard of hearing. She is now on high flow nasal cannula 9L/min;   Objective: Vitals:   07/20/22 1351 07/20/22 1400 07/20/22 1500 07/20/22 1555  BP:  109/62 (!) 138/40   Pulse: 74 84 73   Resp: '20 18 18   '$ Temp:    97.8 F (36.6 C)  TempSrc:    Axillary  SpO2: 98% 93% 100%   Weight:      Height:        Intake/Output Summary (Last 24 hours) at 07/20/2022 1657 Last data filed at 07/20/2022 1521 Gross per 24 hour  Intake 387 ml  Output 1050 ml  Net -663 ml   Filed Weights   07/10/22 0752 07/10/22 2028 07/19/22 0421  Weight: 81.6 kg 81 kg 76.8 kg    Physical Exam  Gen:- Awake Alert, frail and elderly, chronically ill-appearing, hard of hearing  HEENT: NCAT, PERRL.  Nose- on HFNC 9L/m Neck- supple, no thyromegaly;  Lungs- shallow BS, moderate increased work of breathing.  CV- normal S1, S2 sounds  Abd - soft, ND/NT, no HSM.     Extremity - pedal pulses present  Psych-flat affect today.  Neuro-generalized weakness, no new focal deficits, no tremors -Skin - warm, dry, left elbow skin  tear MSK-diffuse osteoarthritic changes.   Data Reviewed: I have personally reviewed following labs and imaging studies  CBC: Recent Labs  Lab 07/15/22 0713 07/16/22 1002 07/17/22 0535 07/18/22 0501 07/19/22 0333  WBC 9.6 14.8* 10.2 12.1* 12.3*  NEUTROABS  --   --  9.0* 10.8* 11.1*  HGB 7.0* 7.6* 7.2* 7.4* 7.5*  HCT 21.6* 23.5* 22.6* 23.4* 23.5*  MCV 89.3 89.7 89.0 89.3 90.4  PLT 115* 137* 158 175 144   Basic Metabolic Panel: Recent Labs  Lab 07/16/22 1002 07/17/22 0535 07/18/22 0501 07/19/22 0333 07/20/22 0311  NA 134* 139 141 140 142  K 4.1 3.5 3.5 3.7 4.3  CL 102 104 104 105 107  CO2 '24 26 27 26 28  '$ GLUCOSE 197* 214* 178* 227* 274*  BUN 38* 48* 58* 63* 73*  CREATININE 1.17* 1.33* 1.35* 1.25* 1.28*  CALCIUM 7.6* 7.4* 7.4* 7.5* 7.7*  MG 2.3 2.4  --   --  2.9*  PHOS  --  4.5  --   --  3.3   GFR: Estimated Creatinine Clearance: 26.9 mL/min (A) (by C-G formula based on SCr of 1.28 mg/dL (H)).  Recent Results (from the past 240 hour(s))  SARS Coronavirus 2 by RT PCR (hospital order, performed in [facility] hospital lab) *cepheid single result test* Anterior Nasal Swab     Status: Abnormal   Collection Time: 07/13/22  1:50 PM   Specimen: Anterior Nasal Swab  Result Value Ref Range Status   SARS Coronavirus 2 by RT PCR POSITIVE (A) NEGATIVE Final    Comment: (NOTE) SARS-CoV-2 target nucleic acids are DETECTED  SARS-CoV-2 RNA is generally detectable in upper respiratory specimens  during the acute phase of infection.  Positive results are indicative  of the presence of the identified virus, but do not rule out bacterial infection or co-infection with  other pathogens not detected by the test.  Clinical correlation with patient history and  other diagnostic information is necessary to determine patient infection status.  The expected result is negative.  Fact Sheet for Patients:   https://www.patel.info/   Fact Sheet for Healthcare Providers:    https://hall.com/    This test is not yet approved or cleared by the [place] FDA and  has been authorized for detection and/or diagnosis of SARS-CoV-2 by FDA under an Emergency Use Authorization (EUA).  This EUA will remain in effect (meaning this test can be used) for the duration of  the COVID-19 declaration under Section 564(b)(1)  of the Act, 21 U.S.C. section 360-bbb-3(b)(1), unless the authorization is terminated or revoked sooner.   Performed at [facility], [address] [place], [place] [address]   MRSA Next Gen by PCR, Nasal     Status: Abnormal   Collection Time: 07/18/22  7:57 PM   Specimen: Nasal Mucosa; Nasal Swab  Result Value Ref Range Status   MRSA by PCR Next Gen DETECTED (A) NOT DETECTED Final    Comment: CRITICAL RESULT CALLED TO, READ BACK BY AND VERIFIED WITH: L. HYLTON BY LBASTON AT 1721, 07/19/22. (NOTE) The GeneXpert MRSA Assay (FDA approved for NASAL specimens only), is one component of a comprehensive MRSA colonization surveillance program. It is not intended to diagnose MRSA infection nor to guide or monitor treatment for MRSA infections. Test performance is not FDA approved in patients less than 53 years old. Performed at [facility], [address], [place], [place] [address]    Scheduled Meds:  bisacodyl  10 mg Rectal Daily   Chlorhexidine Gluconate Cloth  6 each Topical Daily   dextromethorphan-guaiFENesin  1 tablet Oral BID   doxycycline  100 mg Oral Q12H   feeding supplement (GLUCERNA SHAKE)  237 mL Oral TID BM   heparin  5,000 Units Subcutaneous Q8H   insulin aspart  0-15 Units Subcutaneous TID WC   insulin aspart  0-5 Units Subcutaneous QHS   insulin glargine-yfgn  16 Units Subcutaneous QHS   ipratropium-albuterol  3 mL Nebulization TID   methocarbamol  500 mg Oral TID   methylPREDNISolone (SOLU-MEDROL) injection  40 mg Intravenous Q24H   multivitamin with minerals  1 tablet Oral Daily   mupirocin  ointment  1 Application Nasal BID   polyethylene glycol  17 g Oral BID   QUEtiapine  12.5 mg Oral QHS   senna-docusate  1 tablet Oral BID   sertraline  75 mg Oral Daily   sodium chloride flush  3 mL Intravenous Q12H   sodium chloride flush  3 mL Intravenous Q12H   Continuous Infusions:  sodium chloride Stopped (07/13/22 2148)    LOS: 10 days   Thersia Petraglia M.D  07/20/2022 at 4:57 PM  Go to [email or web address] - for contact info  [facility] - Office  [phone]  If 7PM-7AM, please contact night-coverage [email or web address] 07/20/2022, 4:57 PM

## 2022-07-21 DIAGNOSIS — L8990 Pressure ulcer of unspecified site, unspecified stage: Secondary | ICD-10-CM | POA: Insufficient documentation

## 2022-07-21 DIAGNOSIS — W19XXXA Unspecified fall, initial encounter: Secondary | ICD-10-CM | POA: Diagnosis not present

## 2022-07-21 DIAGNOSIS — Z66 Do not resuscitate: Secondary | ICD-10-CM | POA: Diagnosis not present

## 2022-07-21 DIAGNOSIS — Z515 Encounter for palliative care: Secondary | ICD-10-CM | POA: Diagnosis not present

## 2022-07-21 DIAGNOSIS — Z7189 Other specified counseling: Secondary | ICD-10-CM | POA: Diagnosis not present

## 2022-07-21 DIAGNOSIS — D72829 Elevated white blood cell count, unspecified: Secondary | ICD-10-CM | POA: Diagnosis not present

## 2022-07-21 DIAGNOSIS — R0902 Hypoxemia: Secondary | ICD-10-CM

## 2022-07-21 DIAGNOSIS — S32592A Other specified fracture of left pubis, initial encounter for closed fracture: Secondary | ICD-10-CM | POA: Diagnosis not present

## 2022-07-21 DIAGNOSIS — J189 Pneumonia, unspecified organism: Secondary | ICD-10-CM

## 2022-07-21 LAB — CBC
HCT: 25.9 % — ABNORMAL LOW (ref 36.0–46.0)
Hemoglobin: 7.8 g/dL — ABNORMAL LOW (ref 12.0–15.0)
MCH: 28.9 pg (ref 26.0–34.0)
MCHC: 30.1 g/dL (ref 30.0–36.0)
MCV: 95.9 fL (ref 80.0–100.0)
Platelets: 185 10*3/uL (ref 150–400)
RBC: 2.7 MIL/uL — ABNORMAL LOW (ref 3.87–5.11)
RDW: 17.6 % — ABNORMAL HIGH (ref 11.5–15.5)
WBC: 15.1 10*3/uL — ABNORMAL HIGH (ref 4.0–10.5)
nRBC: 0 % (ref 0.0–0.2)

## 2022-07-21 LAB — GLUCOSE, CAPILLARY
Glucose-Capillary: 156 mg/dL — ABNORMAL HIGH (ref 70–99)
Glucose-Capillary: 128 mg/dL — ABNORMAL HIGH (ref 70–99)
Glucose-Capillary: 109 mg/dL — ABNORMAL HIGH (ref 70–99)
Glucose-Capillary: 209 mg/dL — ABNORMAL HIGH (ref 70–99)

## 2022-07-21 LAB — BASIC METABOLIC PANEL
Anion gap: 8 (ref 5–15)
BUN: 53 mg/dL — ABNORMAL HIGH (ref 8–23)
CO2: 24 mmol/L (ref 22–32)
Calcium: 7.8 mg/dL — ABNORMAL LOW (ref 8.9–10.3)
Chloride: 114 mmol/L — ABNORMAL HIGH (ref 98–111)
Creatinine, Ser: 1 mg/dL (ref 0.44–1.00)
GFR, Estimated: 52 mL/min — ABNORMAL LOW (ref >60)
Glucose, Bld: 179 mg/dL — ABNORMAL HIGH (ref 70–99)
Potassium: 4.8 mmol/L (ref 3.5–5.1)
Sodium: 146 mmol/L — ABNORMAL HIGH (ref 135–145)

## 2022-07-21 NOTE — Progress Notes (Signed)
Pt was down from 9L to [facility] with oxygen sats in low 90s. After bathing pt, she became hypoxic at 75% on 4L. Increased oxygen to 7L, oxygen sat up to 83%. Increased again to 9L and pt able to maintain saturation at 95%.

## 2022-07-21 NOTE — Progress Notes (Signed)
PT Cancellation Note  Patient Details Name: Sheena [name] MRN: [id number] DOB: [birth year]   Cancelled Treatment:    Reason Eval/Treat Not Completed: Medical issues which prohibited therapy.  Patient transferred to a higher level of care and will need new PT consult to resume therapy when patient is medically stable.  Thank you.    3:21 PM, 07/21/22 Lonell Grandchild, MPT Physical Therapist with [facility] [phone] office [phone] mobile phone

## 2022-07-21 NOTE — Progress Notes (Signed)
PROGRESS NOTE     Sheena Sheena [name], is Sheena [name] 88 y.o. female, DOB - [birth year], JKD:[id number]  Admit date - 07/10/2022   Admitting Physician Sheena Denton Brick, MD  Outpatient Primary MD for the patient is Sheena [name], Sheena Baxter, MD  LOS - 52  Chief Complaint  Patient presents with   Fall      Brief Narrative:   88 y.o. female with pmhx relevant for depression/demention, HTN , DM2.Marland Kitchen  COPD admitted on 07/10/2022 from high [place] assisted living facility with Lt hip pain after mechanical fall and found to have pubic rami fractures, rib fracture, also found to + covid, worsening hypoxia   @'@please'$  call HPOA grandson Sheena Sheena [name] at [phone] and daughter Sheena [name] at [phone] if patient deteriorate, they are both aware the possibility and agreed to be called at night if Sina deteriorate. Family agreed to transition to full comfort measures if Sheena Sheena [name] deteriorate.@@  Sheena [name]/p   COVID-19 infection with acute on chronic hypoxic respiratory failure--she was tested + on 12/4 -reports on home o2 at [facility] for Sheena [name] few years  -IV steroids and bronchodilators as ordered -Paxlovid on 07/13/22-07/18/22 -increased oxygen requirement on 12/7, repeat cxr showed worsening of pneumonia,  started back on abx, continue steroids,  prn iv lasix -o2 requirement holding steady around 9-10 liters for the last few days, she does not appear in significant respiratory distress, crp slowing coming down -discussed with daughter Sheena Sheena [name] and HPOA Sheena Sheena [name] may consider comfort measures if she deteriorate, they are in agreement -If no meaningful improvement or recovery in next 24 hours of intensive medical therapy then would plan to transition to full comfort measures; palliative care team consulted to assist;    Klebsiella UTI ,POA -finished treatment with IV Rocephin -last dose 07/15/2022    Acute Left pubic rami fractures----after mechanical fall (POA) -Outpatient follow-up with orthopedic  -Pain control appears adequate -SNF  placement was the initial plan but may change depending on palliative discussions   Lt sided rib fractures (POA) from the fall, does not appear in pain    IDDM2 -episodes of hypoglycemia , insulin dose decrease, continue to adjust insulin dose as needed -currently on steroids CBG (last 3)  Recent Labs    07/20/22 2229 07/21/22 0714 07/21/22 1102  GLUCAP 189* 156* 128*     HTN- bp stable    chronic anemia- Hgb is down to 7.1 from 9.8 - no bleeding concerns at this time monitor closely -Repeat CBC on 07/15/22---- transfuse if Hgb less than 7  Dementia and depression--- continue trazodone as needed, continue Zoloft 75 mg daily and Seroquel 12.5 mg No agitation, no oriented to time ,but to place and person    Recurrent Falls--- at baseline patient ambulates with Sheena [name] walker from ALF -presents with  Sheena [name] mechanical fall- -CT head and CT C-spine without acute findings PTA pt lived at [facility] ALF and did very poorly, patient has significant limitations with mobility related ADLs- this patient needs to continue to be monitored in the hospital until Sheena [name] SNF bed is obtained as she is not safe to go home with her current physical limitations -daughter desires palliative care to follow patient at snf if able to discharge     Dispo: The patient is from: ALF                         Anticipated d/c date is:  may need to transition to full comfort measures if she deteriorates  Code Status :  -  Code Status: DNR , verified with family   Family Communication:  Dr. Erlinda [name] spoke with daughter Sheena Sheena [name] [phone] on 12/6 , 12/7, 12/8, 12/9, 12/10 Sheena Sheena [name] on 12/8    DVT Prophylaxis  :   - SCDs   heparin injection 5,000 Units Start: 07/10/22 1800 SCDs Start: 07/10/22 1559 Place TED hose Start: 07/10/22 1559   Lab Results  Component Value Date   PLT 185 07/21/2022    Inpatient Medications  Scheduled Meds:  Chlorhexidine Gluconate Cloth  6 each Topical Daily    dextromethorphan-guaiFENesin  1 tablet Oral BID   doxycycline  100 mg Oral Q12H   feeding supplement (GLUCERNA SHAKE)  237 mL Oral TID BM   heparin  5,000 Units Subcutaneous Q8H   insulin aspart  0-15 Units Subcutaneous TID WC   insulin aspart  0-5 Units Subcutaneous QHS   insulin glargine-yfgn  16 Units Subcutaneous QHS   ipratropium-albuterol  3 mL Nebulization TID   methocarbamol  500 mg Oral TID   methylPREDNISolone (SOLU-MEDROL) injection  40 mg Intravenous Q24H   multivitamin with minerals  1 tablet Oral Daily   mupirocin ointment  1 Application Nasal BID   polyethylene glycol  17 g Oral BID   QUEtiapine  12.5 mg Oral QHS   senna-docusate  1 tablet Oral BID   sertraline  75 mg Oral Daily   sodium chloride flush  3 mL Intravenous Q12H   sodium chloride flush  3 mL Intravenous Q12H   Continuous Infusions:  sodium chloride Stopped (07/13/22 2148)   PRN Meds:.sodium chloride, acetaminophen **OR** acetaminophen, acetaminophen, albuterol, ALPRAZolam, bisacodyl, guaiFENesin-dextromethorphan, hydrALAZINE, ipratropium-albuterol, morphine injection, ondansetron **OR** ondansetron (ZOFRAN) IV, mouth rinse, oxyCODONE, sodium chloride flush, traZODone   Anti-infectives (From admission, onward)    Start     Dose/Rate Route Frequency Ordered Stop   07/17/22 1445  doxycycline (VIBRA-TABS) tablet 100 mg        100 mg Oral Every 12 hours 07/17/22 1356     07/16/22 1730  cefTRIAXone (ROCEPHIN) 1 g in sodium chloride 0.9 % 100 mL IVPB  Status:  Discontinued        1 g 200 mL/hr over 30 Minutes Intravenous Every 24 hours 07/16/22 1638 07/17/22 1356   07/15/22 1430  cefTRIAXone (ROCEPHIN) 1 g in sodium chloride 0.9 % 100 mL IVPB        1 g 200 mL/hr over 30 Minutes Intravenous  Once 07/15/22 1342 07/15/22 1428   07/13/22 2200  nirmatrelvir/ritonavir EUA (PAXLOVID) 3 tablet  Status:  Discontinued        3 tablet Oral 2 times daily 07/13/22 1541 07/13/22 1550   07/13/22 1700   nirmatrelvir/ritonavir EUA (renal dosing) (PAXLOVID) 2 tablet        2 tablet Oral 2 times daily 07/13/22 1550 07/18/22 2159   07/13/22 0000  cephALEXin (KEFLEX) 250 MG capsule        250 mg Oral 3 times daily 07/13/22 1401 07/16/22 2359   07/13/22 0000  azithromycin (ZITHROMAX) 500 MG tablet        500 mg Oral Daily 07/13/22 1433 07/16/22 2359   07/11/22 1200  cefTRIAXone (ROCEPHIN) 1 g in sodium chloride 0.9 % 100 mL IVPB        1 g 200 mL/hr over 30 Minutes Intravenous Every 24 hours 07/10/22 1555 07/15/22 2359   07/10/22 1930  azithromycin (ZITHROMAX) 500 mg in sodium chloride 0.9 % 250 mL IVPB  500 mg 250 mL/hr over 60 Minutes Intravenous Every 24 hours 07/10/22 1917 07/14/22 1845   07/10/22 1300  cefTRIAXone (ROCEPHIN) 2 g in sodium chloride 0.9 % 100 mL IVPB        2 g 200 mL/hr over 30 Minutes Intravenous  Once 07/10/22 1255 07/10/22 1336        Subjective: Hard of hearing. She is now on high flow nasal cannula 9L/min; Having some SOB symptoms.   Objective: Vitals:   07/21/22 0840 07/21/22 0848 07/21/22 1000 07/21/22 1103  BP:   (!) 120/39   Pulse: 71 78 74   Resp: 15 17 (!) 21   Temp:    (!) 97.5 F (36.4 C)  TempSrc:    Axillary  SpO2: 100% 95% 94%   Weight:      Height:        Intake/Output Summary (Last 24 hours) at 07/21/2022 1406 Last data filed at 07/21/2022 1000 Gross per 24 hour  Intake 390 ml  Output 1050 ml  Net -660 ml   Filed Weights   07/10/22 0752 07/10/22 2028 07/19/22 0421  Weight: 81.6 kg 81 kg 76.8 kg    Physical Exam  Gen:- Awake Alert, frail and elderly, chronically ill-appearing, hard of hearing  HEENT: NCAT, PERRL.  Nose- on HFNC 9L/m Neck- supple, no thyromegaly;  Lungs- shallow BS, moderate increased work of breathing.  CV- normal S1, S2 sounds  Abd - soft, ND/NT, no HSM.     Extremity - pedal pulses present  Psych-flat affect today.  Neuro-generalized weakness, no new focal deficits, no tremors -Skin - warm, dry, left  elbow skin tear MSK-diffuse osteoarthritic changes.   Data Reviewed: I have personally reviewed following labs and imaging studies  CBC: Recent Labs  Lab 07/16/22 1002 07/17/22 0535 07/18/22 0501 07/19/22 0333 07/21/22 0409  WBC 14.8* 10.2 12.1* 12.3* 15.1*  NEUTROABS  --  9.0* 10.8* 11.1*  --   HGB 7.6* 7.2* 7.4* 7.5* 7.8*  HCT 23.5* 22.6* 23.4* 23.5* 25.9*  MCV 89.7 89.0 89.3 90.4 95.9  PLT 137* 158 175 185 706   Basic Metabolic Panel: Recent Labs  Lab 07/16/22 1002 07/17/22 0535 07/18/22 0501 07/19/22 0333 07/20/22 0311 07/21/22 0409  NA 134* 139 141 140 142 146*  K 4.1 3.5 3.5 3.7 4.3 4.8  CL 102 104 104 105 107 114*  CO2 '24 26 27 26 28 24  '$ GLUCOSE 197* 214* 178* 227* 274* 179*  BUN 38* 48* 58* 63* 73* 53*  CREATININE 1.17* 1.33* 1.35* 1.25* 1.28* 1.00  CALCIUM 7.6* 7.4* 7.4* 7.5* 7.7* 7.8*  MG 2.3 2.4  --   --  2.9*  --   PHOS  --  4.5  --   --  3.3  --    GFR: Estimated Creatinine Clearance: 34.4 mL/min (by C-G formula based on SCr of 1 mg/dL).  Recent Results (from the past 240 hour(s))  SARS Coronavirus 2 by RT PCR (hospital order, performed in [facility] hospital lab) *cepheid single result test* Anterior Nasal Swab     Status: Abnormal   Collection Time: 07/13/22  1:50 PM   Specimen: Anterior Nasal Swab  Result Value Ref Range Status   SARS Coronavirus 2 by RT PCR POSITIVE (Sheena [name]) NEGATIVE Final    Comment: (NOTE) SARS-CoV-2 target nucleic acids are DETECTED  SARS-CoV-2 RNA is generally detectable in upper respiratory specimens  during the acute phase of infection.  Positive results are indicative  of the presence of the identified virus,  but do not rule out bacterial infection or co-infection with other pathogens not detected by the test.  Clinical correlation with patient history and  other diagnostic information is necessary to determine patient infection status.  The expected result is negative.  Fact Sheet for Patients:    https://www.patel.info/   Fact Sheet for Healthcare Providers:   https://hall.com/    This test is not yet approved or cleared by the [place] FDA and  has been authorized for detection and/or diagnosis of SARS-CoV-2 by FDA under an Emergency Use Authorization (EUA).  This EUA will remain in effect (meaning this test can be used) for the duration of  the COVID-19 declaration under Section 564(b)(1)  of the Act, 21 U.S.C. section 360-bbb-3(b)(1), unless the authorization is terminated or revoked sooner.   Performed at [facility], [address], [place], [place] [address]   MRSA Next Gen by PCR, Nasal     Status: Abnormal   Collection Time: 07/18/22  7:57 PM   Specimen: Nasal Mucosa; Nasal Swab  Result Value Ref Range Status   MRSA by PCR Next Gen DETECTED (Sheena [name]) NOT DETECTED Final    Comment: CRITICAL RESULT CALLED TO, READ BACK BY AND VERIFIED WITH: L. HYLTON BY LBASTON AT 1721, 07/19/22. (NOTE) The GeneXpert MRSA Assay (FDA approved for NASAL specimens only), is one component of Sheena [name] comprehensive MRSA colonization surveillance program. It is not intended to diagnose MRSA infection nor to guide or monitor treatment for MRSA infections. Test performance is not FDA approved in patients less than 43 years old. Performed at [facility], [address] [place], [place] [address]    Scheduled Meds:  Chlorhexidine Gluconate Cloth  6 each Topical Daily   dextromethorphan-guaiFENesin  1 tablet Oral BID   doxycycline  100 mg Oral Q12H   feeding supplement (GLUCERNA SHAKE)  237 mL Oral TID BM   heparin  5,000 Units Subcutaneous Q8H   insulin aspart  0-15 Units Subcutaneous TID WC   insulin aspart  0-5 Units Subcutaneous QHS   insulin glargine-yfgn  16 Units Subcutaneous QHS   ipratropium-albuterol  3 mL Nebulization TID   methocarbamol  500 mg Oral TID   methylPREDNISolone (SOLU-MEDROL) injection  40 mg Intravenous Q24H   multivitamin  with minerals  1 tablet Oral Daily   mupirocin ointment  1 Application Nasal BID   polyethylene glycol  17 g Oral BID   QUEtiapine  12.5 mg Oral QHS   senna-docusate  1 tablet Oral BID   sertraline  75 mg Oral Daily   sodium chloride flush  3 mL Intravenous Q12H   sodium chloride flush  3 mL Intravenous Q12H   Continuous Infusions:  sodium chloride Stopped (07/13/22 2148)    LOS: 11 days   Zaida Reiland M.D  07/21/2022 at 2:06 PM  Go to [email or web address] - for contact info  [facility] - Office  [phone]  If 7PM-7AM, please contact night-coverage [email or web address] 07/21/2022, 2:06 PM

## 2022-07-21 NOTE — Progress Notes (Signed)
Daily Progress Note   Patient Name: Sheena [name]       Date: 07/21/2022 DOB: 02-18-1924  Age: 88 y.o. MRN#: [id number] Attending Physician: Murlean Iba, MD Primary Care Physician: Carrolyn Meiers, MD Admit Date: 07/10/2022 Length of Stay: 11 days  Reason for Consultation/Follow-up: Establishing goals of care  HPI/Patient Profile:  88 y.o. female  with past medical history of depression/demention, HTN , DM2.Marland Kitchen  COPD admitted on 07/10/2022 from high [place] assisted living facility with Lt hip pain after mechanical fall and found to have pubic rami fractures, rib fracture, also found to + covid, worsening hypoxia. During hospitalization she has had worsening respiratory status and increasing oxygen needs.   PMT was consulted for [place] conversations.  Subjective:   Subjective: Chart Reviewed. Updates received. Patient Assessed. Created space and opportunity for patient  and family to explore thoughts and feelings regarding current medical situation.  Today's Discussion: Today met with the patient at the bedside.  She was sleeping but easily awakened to voice.  She states that she is somewhat short of breath, denies any pain today.  No nausea or vomiting.  States that she is hungry and could eat.  Discussion with nursing staff indicates patient has eaten a little bit.  She is about to give her medications crushed in applesauce at this time as well.  She notes that oxygen has been turned down to 4 L/min (likely a test to see her tolerance) but she rapidly desatted into the 70s when the CNA attempted to give her a bath.  Oxygen was increased to 6 L/min but she was only satting in the 80s so it was bumped back up into 9 L/min.  She is currently satting 94 to 96% on 9 liters per minute.  With conversation she does appear somewhat dyspneic, although sats maintained themselves.  No other complaints today.  I provided emotional and general support through therapeutic listening, empathy,  sharing of stories, and other techniques. I answered all questions and addressed all concerns to the best of my ability.  Review of Systems  Constitutional:        Denies pain in general  Respiratory:  Positive for shortness of breath. Negative for chest tightness.   Cardiovascular:  Negative for chest pain.  Gastrointestinal:  Negative for abdominal pain, nausea and vomiting.    Objective:   Vital Signs:  BP (!) 120/39   Pulse 74   Temp (!) 97.5 F (36.4 C) (Axillary)   Resp (!) 21   Ht _0  (1.676 m)   Wt 76.8 kg   SpO2 94%   BMI 27.33 kg/m   Physical Exam: Physical Exam Vitals and nursing note reviewed.  Constitutional:      General: She is sleeping. She is not in acute distress.    Appearance: She is ill-appearing.     Comments: Appears fatigued  HENT:     Head: Normocephalic and atraumatic.  Cardiovascular:     Rate and Rhythm: Normal rate.  Pulmonary:     Effort: Pulmonary effort is normal. No respiratory distress.     Comments: Does become slightly tachypneic with excessive talking Abdominal:     General: Abdomen is flat. Bowel sounds are normal.     Palpations: Abdomen is soft.  Skin:    General: Skin is warm and dry.  Neurological:     Mental Status: She is easily aroused.  Psychiatric:        Mood and Affect: Mood normal.  Behavior: Behavior normal.     Palliative Assessment/Data: 20-30%    Existing Vynca/ACP Documentation: None  Assessment & Plan:   Impression: Present on Admission:  Closed fracture of multiple pubic rami, left, initial encounter (HCC)  COPD (chronic obstructive pulmonary disease) ([place])  Hypertension  88 year old female status post fall with fractures likely requiring rehab.  She tested positive for COVID-19 and developed pneumonia with worsening oxygen needs.  Her oxygen needs have been stable at 9 to 10 L/min for the past several days.  Family is aware of her predicament.  They seem amendable to comfort care if she  deteriorates.  I confirmed with [name] (grandson/HCPOA) to continue current care, no escalation of care, remain DNR, further [place] conversations as the patient clinical progress evolves.  Today patient had desat with activity (bath) requiring oxygen back up to 9 lpm. Overall prognosis is poor.  SUMMARY OF RECOMMENDATIONS   Remain DNR Continue current scope of care No escalation of care If deterioration, family seems agreeable to comfort care Further [place] conversations as clinical picture evolves (per hospitalist note, consider comfort if no improvement in 24 hours) PMT will continue to follow, plan in person follow-up tomorrow for more goals conversations with family Continued emotional and spiritual support of the patient and family  Symptom Management:  Per primary team PMT is available to assist as needed, especially if transition to comfort care  Code Status: DNR  Prognosis: Unable to determine  Discharge Planning: To Be Determined  Discussed with: Patient, medical team, nursing team, patient's family  Thank you for allowing [place] to participate in the care of Sheena [name] PMT will continue to support holistically.  Billing based on MDM: High  Problems Addressed: One acute or chronic illness or injury that poses a threat to life or bodily function  Amount and/or Complexity of Data: Category 3:Discussion of management or test interpretation with external physician/other qualified health care professional/appropriate source (not separately reported)  Risks: Decision not to resuscitate or to de-escalate care because of poor prognosis (confirmed DNR)   Walden Field, NP Palliative Medicine Team  Team Phone # [phone] (Nights/Weekends)  04/08/2021, 8:17 AM

## 2022-07-22 ENCOUNTER — Inpatient Hospital Stay (HOSPITAL_COMMUNITY): Payer: Medicare Other

## 2022-07-22 DIAGNOSIS — J9621 Acute and chronic respiratory failure with hypoxia: Secondary | ICD-10-CM

## 2022-07-22 DIAGNOSIS — U071 COVID-19: Secondary | ICD-10-CM | POA: Insufficient documentation

## 2022-07-22 DIAGNOSIS — J1282 Pneumonia due to coronavirus disease 2019: Secondary | ICD-10-CM | POA: Insufficient documentation

## 2022-07-22 DIAGNOSIS — Z7189 Other specified counseling: Secondary | ICD-10-CM | POA: Diagnosis not present

## 2022-07-22 DIAGNOSIS — Z66 Do not resuscitate: Secondary | ICD-10-CM | POA: Diagnosis not present

## 2022-07-22 DIAGNOSIS — Z515 Encounter for palliative care: Secondary | ICD-10-CM | POA: Diagnosis not present

## 2022-07-22 DIAGNOSIS — S32592A Other specified fracture of left pubis, initial encounter for closed fracture: Secondary | ICD-10-CM | POA: Diagnosis not present

## 2022-07-22 DIAGNOSIS — W19XXXA Unspecified fall, initial encounter: Secondary | ICD-10-CM | POA: Diagnosis not present

## 2022-07-22 LAB — CBC
HCT: 24.4 % — ABNORMAL LOW (ref 36.0–46.0)
Hemoglobin: 7.6 g/dL — ABNORMAL LOW (ref 12.0–15.0)
MCH: 28.8 pg (ref 26.0–34.0)
MCHC: 31.1 g/dL (ref 30.0–36.0)
MCV: 92.4 fL (ref 80.0–100.0)
Platelets: 236 10*3/uL (ref 150–400)
RBC: 2.64 MIL/uL — ABNORMAL LOW (ref 3.87–5.11)
RDW: 17.4 % — ABNORMAL HIGH (ref 11.5–15.5)
WBC: 26.4 10*3/uL — ABNORMAL HIGH (ref 4.0–10.5)
nRBC: 0 % (ref 0.0–0.2)

## 2022-07-22 LAB — D-DIMER, QUANTITATIVE: D-Dimer, Quant: 7.96 ug/mL-FEU — ABNORMAL HIGH (ref 0.00–0.50)

## 2022-07-22 LAB — BLOOD GAS, VENOUS
Acid-Base Excess: 6.2 mmol/L — ABNORMAL HIGH (ref 0.0–2.0)
Bicarbonate: 29.5 mmol/L — ABNORMAL HIGH (ref 20.0–28.0)
Drawn by: 7478
O2 Saturation: 78.6 %
Patient temperature: 36.9
pCO2, Ven: 37 mmHg — ABNORMAL LOW (ref 44–60)
pH, Ven: 7.51 — ABNORMAL HIGH (ref 7.25–7.43)
pO2, Ven: 48 mmHg — ABNORMAL HIGH (ref 32–45)

## 2022-07-22 LAB — IRON AND TIBC
Iron: 22 ug/dL — ABNORMAL LOW (ref 28–170)
Saturation Ratios: 14 % (ref 10.4–31.8)
TIBC: 161 ug/dL — ABNORMAL LOW (ref 250–450)
UIBC: 139 ug/dL

## 2022-07-22 LAB — C-REACTIVE PROTEIN: CRP: 12.4 mg/dL — ABNORMAL HIGH (ref <1.0)

## 2022-07-22 LAB — GLUCOSE, CAPILLARY
Glucose-Capillary: 123 mg/dL — ABNORMAL HIGH (ref 70–99)
Glucose-Capillary: 91 mg/dL (ref 70–99)
Glucose-Capillary: 137 mg/dL — ABNORMAL HIGH (ref 70–99)
Glucose-Capillary: 143 mg/dL — ABNORMAL HIGH (ref 70–99)
Glucose-Capillary: 410 mg/dL — ABNORMAL HIGH (ref 70–99)
Glucose-Capillary: 422 mg/dL — ABNORMAL HIGH (ref 70–99)

## 2022-07-22 LAB — BASIC METABOLIC PANEL
Anion gap: 12 (ref 5–15)
BUN: 50 mg/dL — ABNORMAL HIGH (ref 8–23)
CO2: 25 mmol/L (ref 22–32)
Calcium: 8 mg/dL — ABNORMAL LOW (ref 8.9–10.3)
Chloride: 109 mmol/L (ref 98–111)
Creatinine, Ser: 1.07 mg/dL — ABNORMAL HIGH (ref 0.44–1.00)
GFR, Estimated: 48 mL/min — ABNORMAL LOW (ref >60)
Glucose, Bld: 171 mg/dL — ABNORMAL HIGH (ref 70–99)
Potassium: 4 mmol/L (ref 3.5–5.1)
Sodium: 146 mmol/L — ABNORMAL HIGH (ref 135–145)

## 2022-07-22 LAB — PROCALCITONIN: Procalcitonin: <0.1 ng/mL

## 2022-07-22 LAB — BRAIN NATRIURETIC PEPTIDE: B Natriuretic Peptide: 104 pg/mL — ABNORMAL HIGH (ref 0.0–100.0)

## 2022-07-22 LAB — FERRITIN: Ferritin: 195 ng/mL (ref 11–307)

## 2022-07-22 LAB — VITAMIN B12: Vitamin B-12: 364 pg/mL (ref 180–914)

## 2022-07-22 LAB — FOLATE: Folate: 15.8 ng/mL (ref >5.9)

## 2022-07-22 LAB — MAGNESIUM: Magnesium: 2.6 mg/dL — ABNORMAL HIGH (ref 1.7–2.4)

## 2022-07-22 MED ORDER — IPRATROPIUM-ALBUTEROL 0.5-2.5 (3) MG/3ML IN SOLN
3.0000 mL | Freq: Two times a day (BID) | RESPIRATORY_TRACT | Status: DC
  Administered 2022-07-22 – 2022-07-23 (×2): 3 mL via RESPIRATORY_TRACT
  Filled 2022-07-22 (×2): qty 3

## 2022-07-22 MED ORDER — IOHEXOL 350 MG/ML SOLN
100.0000 mL | Freq: Once | INTRAVENOUS | Status: AC | PRN
  Administered 2022-07-22: 100 mL via INTRAVENOUS

## 2022-07-22 NOTE — Progress Notes (Signed)
PROGRESS NOTE  Sheena [name] VQM:[id number] DOB: March 01, 1924 DOA: 07/10/2022 PCP: Carrolyn Meiers, MD  Brief History:  88 y.o. female with pmhx relevant for depression/demention, HTN , DM2.Marland Kitchen  COPD admitted on 07/10/2022 from [facility] [place] [facility] facility with Lt hip pain after mechanical fall and found to have pubic rami fractures, rib fracture, also found to + covid, worsening hypoxia.  She required up to 10L HFNC.  She was started on IV steroid and completed a course of Paxlovid.  She continues to remain deconditioned with desaturation with minimal activity and eating.  Assessment/Plan: COVID-19 infection with acute on chronic hypoxic respiratory failure with hypoxia- -she was tested + on 12/4 -reports on home o2 at [facility] for a few years  -IV steroids and bronchodilators as ordered -Paxlovid on 07/13/22-07/18/22 -increased oxygen requirement on 12/7, repeat cxr showed worsening of pneumonia,  started back on abx, continue steroids,  prn iv lasix -o2 requirement up to 10 liters for the last few days, she does not appear in significant respiratory distress, crp slowing coming down -discussed with daughter Sheena [name] and HPOA Sheena [name] may consider comfort measures if she deteriorate, they are in agreement -check CRP -check D-dimer -may need CT chest  Acute metabolic Encephalopathy -due to hypoxia, infection, UTI  Klebsiella UTI ,POA -finished treatment with IV Rocephin -last dose 07/15/2022   Acute Left pubic rami fractures----after mechanical fall (POA) -Outpatient follow-up with orthopedic  -Pain control appears adequate -SNF placement was the initial plan but may change depending on palliative discussions    Lt sided rib fractures (POA) from the fall, does not appear in pain    Controlled Diabetes mellitus type 2 -07/10/22 A1C--5.9 -episodes of hypoglycemia , insulin dose decrease, continue to adjust insulin dose as needed -currently on steroids  HTN- bp  stable    Anemia of chronic disease Hgb is down to 7.1 from 9.8 - no bleeding concerns at this time monitor closely -check anemia panel   Dementia and depression--- continue trazodone as needed, continue Zoloft 75 mg daily and Seroquel 12.5 mg No agitation, no oriented to time ,but to place and person    Recurrent Falls--- at baseline patient ambulates with a walker from ALF -presents with  a mechanical fall- -CT head and CT C-spine without acute findings PTA pt lived at [facility] ALF and did very poorly, patient has significant limitations with mobility related ADLs- this patient needs to continue to be monitored in the hospital until a SNF bed is obtained as she is not safe to go home with her current physical limitations -daughter desires palliative care to follow patient at snf if able to discharge        Family Communication:   daughter updated 12/13  Consultants:  palliative  Code Status:  DNR  DVT Prophylaxis:  Biehle Heparin / K[place]y Lovenox   Procedures: As Listed in Progress Note Above  Antibiotics: None  RN Pressure Injury Documentation: Pressure Injury 07/19/22 Sacrum Medial Stage 1 -  Intact skin with non-blanchable redness of a localized area usually over a bony prominence. (Active)  07/19/22 0845  Location: Sacrum  Location Orientation: Medial  Staging: Stage 1 -  Intact skin with non-blanchable redness of a localized area usually over a bony prominence.  Wound Description (Comments):   Present on Admission:   Dressing Type Foam - Lift dressing to assess site every shift 07/22/22 0905        Subjective: Patient is pleasantly  confused.  Review of systems is limited.  She denies any nausea, vomiting, diarrhea.  She has some shortness of breath.  Objective: Vitals:   07/22/22 0705 07/22/22 0800 07/22/22 0836 07/22/22 1117  BP:  (!) 135/38    Pulse:  72 77   Resp:  18 (!) 25   Temp: 98.2 F (36.8 C)   97.6 F (36.4 C)  TempSrc: Oral   Axillary  SpO2:   100% 95%   Weight:      Height:        Intake/Output Summary (Last 24 hours) at 07/22/2022 1121 Last data filed at 07/22/2022 0523 Gross per 24 hour  Intake 476 ml  Output 600 ml  Net -124 ml   Weight change:  Exam:  General:  Pt is alert, does not follow commands appropriately, not in acute distress HEENT: No icterus, No thrush, No neck mass, [place]/AT Cardiovascular: RRR, S1/S2, no rubs, no gallops Respiratory: Bilateral crackles.  No wheezing.  Good air movement Abdomen: Soft/+BS, non tender, non distended, no guarding Extremities: No edema, No lymphangitis, No petechiae, No rashes, no synovitis   Data Reviewed: I have personally reviewed following labs and imaging studies Basic Metabolic Panel: Recent Labs  Lab 07/16/22 1002 07/17/22 0535 07/18/22 0501 07/19/22 0333 07/20/22 0311 07/21/22 0409  NA 134* 139 141 140 142 146*  K 4.1 3.5 3.5 3.7 4.3 4.8  CL 102 104 104 105 107 114*  CO2 '24 26 27 26 28 24  '$ GLUCOSE 197* 214* 178* 227* 274* 179*  BUN 38* 48* 58* 63* 73* 53*  CREATININE 1.17* 1.33* 1.35* 1.25* 1.28* 1.00  CALCIUM 7.6* 7.4* 7.4* 7.5* 7.7* 7.8*  MG 2.3 2.4  --   --  2.9*  --   PHOS  --  4.5  --   --  3.3  --    Liver Function Tests: Recent Labs  Lab 07/18/22 0501  AST 27  ALT 20  ALKPHOS 98  BILITOT 0.9  PROT 6.8  ALBUMIN 2.3*   No results for input(s): "LIPASE", "AMYLASE" in the last 168 hours. No results for input(s): "AMMONIA" in the last 168 hours. Coagulation Profile: No results for input(s): "INR", "PROTIME" in the last 168 hours. CBC: Recent Labs  Lab 07/16/22 1002 07/17/22 0535 07/18/22 0501 07/19/22 0333 07/21/22 0409  WBC 14.8* 10.2 12.1* 12.3* 15.1*  NEUTROABS  --  9.0* 10.8* 11.1*  --   HGB 7.6* 7.2* 7.4* 7.5* 7.8*  HCT 23.5* 22.6* 23.4* 23.5* 25.9*  MCV 89.7 89.0 89.3 90.4 95.9  PLT 137* 158 175 185 185   Cardiac Enzymes: No results for input(s): "CKTOTAL", "CKMB", "CKMBINDEX", "TROPONINI" in the last 168  hours. BNP: Invalid input(s): "POCBNP" CBG: Recent Labs  Lab 07/21/22 1556 07/21/22 1939 07/22/22 0519 07/22/22 0702 07/22/22 1114  GLUCAP 109* 209* 123* 91 137*   HbA1C: No results for input(s): "HGBA1C" in the last 72 hours. Urine analysis:    Component Value Date/Time   COLORURINE AMBER (A) 07/10/2022 1137   APPEARANCEUR HAZY (A) 07/10/2022 1137   APPEARANCEUR Clear 03/05/2014 2034   LABSPEC 1.016 07/10/2022 1137   LABSPEC 1.010 03/05/2014 2034   PHURINE 5.0 07/10/2022 1137   GLUCOSEU NEGATIVE 07/10/2022 1137   GLUCOSEU Negative 03/05/2014 2034   HGBUR NEGATIVE 07/10/2022 1137   BILIRUBINUR NEGATIVE 07/10/2022 1137   BILIRUBINUR Negative 03/05/2014 2034   KETONESUR 5 (A) 07/10/2022 1137   PROTEINUR 100 (A) 07/10/2022 1137   UROBILINOGEN 0.2 06/13/2013 1148   NITRITE POSITIVE (A)  07/10/2022 1137   LEUKOCYTESUR LARGE (A) 07/10/2022 1137   LEUKOCYTESUR 1+ 03/05/2014 2034   Sepsis Labs: '@LABRCNTIP'$ (procalcitonin:4,lacticidven:4) ) Recent Results (from the past 240 hour(s))  SARS Coronavirus 2 by RT PCR (hospital order, performed in [facility] hospital lab) *cepheid single result test* Anterior Nasal Swab     Status: Abnormal   Collection Time: 07/13/22  1:50 PM   Specimen: Anterior Nasal Swab  Result Value Ref Range Status   SARS Coronavirus 2 by RT PCR POSITIVE (A) NEGATIVE Final    Comment: (NOTE) SARS-CoV-2 target nucleic acids are DETECTED  SARS-CoV-2 RNA is generally detectable in upper respiratory specimens  during the acute phase of infection.  Positive results are indicative  of the presence of the identified virus, but do not rule out bacterial infection or co-infection with other pathogens not detected by the test.  Clinical correlation with patient history and  other diagnostic information is necessary to determine patient infection status.  The expected result is negative.  Fact Sheet for Patients:    https://www.patel.info/   Fact Sheet for Healthcare Providers:   https://hall.com/    This test is not yet approved or cleared by the [place] FDA and  has been authorized for detection and/or diagnosis of SARS-CoV-2 by FDA under an Emergency Use Authorization (EUA).  This EUA will remain in effect (meaning this test can be used) for the duration of  the COVID-19 declaration under Section 564(b)(1)  of the Act, 21 U.S.C. section 360-bbb-3(b)(1), unless the authorization is terminated or revoked sooner.   Performed at [facility], [address] [place], [place] [address]   MRSA Next Gen by PCR, Nasal     Status: Abnormal   Collection Time: 07/18/22  7:57 PM   Specimen: Nasal Mucosa; Nasal Swab  Result Value Ref Range Status   MRSA by PCR Next Gen DETECTED (A) NOT DETECTED Final    Comment: CRITICAL RESULT CALLED TO, READ BACK BY AND VERIFIED WITH: L. HYLTON BY LBASTON AT 1721, 07/19/22. (NOTE) The GeneXpert MRSA Assay (FDA approved for NASAL specimens only), is one component of a comprehensive MRSA colonization surveillance program. It is not intended to diagnose MRSA infection nor to guide or monitor treatment for MRSA infections. Test performance is not FDA approved in patients less than 16 years old. Performed at [facility], [address] [place], [place] [address]      Scheduled Meds:  Chlorhexidine Gluconate Cloth  6 each Topical Daily   dextromethorphan-guaiFENesin  1 tablet Oral BID   doxycycline  100 mg Oral Q12H   feeding supplement (GLUCERNA SHAKE)  237 mL Oral TID BM   heparin  5,000 Units Subcutaneous Q8H   insulin aspart  0-15 Units Subcutaneous TID WC   insulin aspart  0-5 Units Subcutaneous QHS   insulin glargine-yfgn  16 Units Subcutaneous QHS   ipratropium-albuterol  3 mL Nebulization TID   methocarbamol  500 mg Oral TID   methylPREDNISolone (SOLU-MEDROL) injection  40 mg Intravenous Q24H   multivitamin  with minerals  1 tablet Oral Daily   mupirocin ointment  1 Application Nasal BID   polyethylene glycol  17 g Oral BID   QUEtiapine  12.5 mg Oral QHS   senna-docusate  1 tablet Oral BID   sertraline  75 mg Oral Daily   sodium chloride flush  3 mL Intravenous Q12H   sodium chloride flush  3 mL Intravenous Q12H   Continuous Infusions:  sodium chloride Stopped (07/13/22 2148)    Procedures/Studies:  DG CHEST PORT 1 VIEW  Result Date: 07/20/2022 CLINICAL DATA:  Acute on chronic respiratory failure EXAM: PORTABLE CHEST 1 VIEW COMPARISON:  Chest radiograph dated 07/16/2022 FINDINGS: Low lung volumes. Head slightly improved right upper lung aeration. Persistent bilateral lower lung patchy and interstitial opacities. Questionable trace bilateral pleural effusions. No pneumothorax. Similar cardiomediastinal silhouette. The visualized skeletal structures are unremarkable. IMPRESSION: 1. Slightly improved right upper lung aeration. 2. Persistent bilateral lower lung patchy and interstitial opacities. 3. Questionable trace bilateral pleural effusions. Electronically Signed   By: Darrin Nipper M.D.   On: 07/20/2022 09:26   US RENAL  Result Date: 07/17/2022 CLINICAL DATA:  Elevated creatinine level. EXAM: RENAL / URINARY TRACT ULTRASOUND COMPLETE COMPARISON:  None Available. FINDINGS: Right Kidney: Renal measurements: 9.3 x 5.1 x 3.6 cm = volume: 89 mL. Cortical thinning is noted. Mildly increased echogenicity of renal parenchyma is noted. No mass or hydronephrosis visualized. Left Kidney: Renal measurements: 8.7 x 5.0 x 4.9 cm = volume: 110 mL. Cortical thinning is noted. Mildly increased echogenicity of renal parenchyma is noted. 1.2 cm simple cyst is seen in upper pole. No mass or hydronephrosis visualized. Bladder: Not well visualized due to lack of distension. Other: None. IMPRESSION: Mildly increased echogenicity of renal parenchyma is noted suggesting medical renal disease. Mild cortical thinning is noted  bilaterally as well. No hydronephrosis or renal obstruction is noted. Electronically Signed   By: Marijo Conception M.D.   On: 07/17/2022 11:26   DG CHEST PORT 1 VIEW  Result Date: 07/16/2022 CLINICAL DATA:  Hypoxia. EXAM: PORTABLE CHEST 1 VIEW COMPARISON:  July 10, 2022. FINDINGS: Stable cardiomediastinal silhouette. Increased bilateral lung opacities are noted, right greater than left, concerning for multifocal pneumonia. Continued presence of mildly displaced left clavicular fracture. IMPRESSION: Significantly increased bilateral lung opacities are noted, right greater than left, concerning for multifocal pneumonia. Electronically Signed   By: Marijo Conception M.D.   On: 07/16/2022 14:25   CT Chest Wo Contrast  Result Date: 07/10/2022 CLINICAL DATA:  Pleural effusion, evaluate for malignancy EXAM: CT CHEST WITHOUT CONTRAST TECHNIQUE: Multidetector CT imaging of the chest was performed following the [name] protocol without IV contrast. RADIATION DOSE REDUCTION: This exam was performed according to the departmental dose-optimization program which includes automated exposure control, adjustment of the mA and/or kV according to patient size and/or use of iterative reconstruction technique. COMPARISON:  Previous chest radiographs including the examination done on 07/10/2022 and CT chest done on 05/01/2010 FINDINGS: Cardiovascular: Scattered calcifications are seen in thoracic aorta and its major branches. There are small scattered coronary artery calcifications are noted. There is dense calcification in the region of mitral annulus. There is ectasia of main pulmonary artery measuring 3.8 cm suggesting pulmonary arterial hypertension. There is ectasia of ascending thoracic aorta measuring 3.8 cm. Mediastinum/Nodes: There is slight interval increase in size of lymph nodes in mediastinum measuring up to 10 mm in short axis. In the previous CT, lymph nodes were seen in the mediastinum measuring up to 8.6 mm in  short axis measurement. There is 1.4 cm smooth marginated fluid density nodule in the posterior right lobe of thyroid. There is increase in size since the previous study. No follow-up is recommended. Lungs/Pleura: There is ectasia of bronchi in both lungs, more so in the lower lung fields with interval worsening. Increased interstitial markings are seen in the periphery of both lungs, more so in the lower lung fields with interval progression of pulmonary fibrosis. There are scattered faint ground-glass densities  in mid and lower lung fields. There is no focal consolidation. There is no pleural effusion or pneumothorax. Upper Abdomen: Small hiatal hernia is seen. Scattered calcifications are seen in abdominal aorta. There is 11 mm exophytic low-density lesion in the posterolateral aspect of upper pole of left kidney suggesting possible renal cyst. No follow-up imaging is recommended. Musculoskeletal: Recent undisplaced fractures are seen in the posterior aspects of left second and third ribs. Deformities are noted in the anterior/anterolateral aspect of left second, third and fourth ribs, possibly recent fractures. There is encroachment of neural foramina by bony spurs in the visualized lower cervical spine. Small bony spurs are seen in both shoulders. IMPRESSION: Bronchiectasis. Increased interstitial markings are seen in the periphery of both lungs with interval progression suggesting worsening of pulmonary fibrosis. There are faint ground-glass densities in the mid and lower lung fields suggesting interstitial lung disease or interstitial pneumonia. There is no focal pulmonary consolidation. There is no pleural effusion. There is slight increase in size of lymph nodes in mediastinum, possibly suggesting reactive hyperplasia. There is ectasia of main pulmonary artery suggesting pulmonary arterial hypertension. Scattered coronary artery calcifications are seen. Aortic atherosclerosis. Recent fractures are seen in  left second, third and fourth ribs. There is no pneumothorax. Small hiatal hernia. Other findings as described in the body of the report. Electronically Signed   By: Elmer Picker M.D.   On: 07/10/2022 12:29   CT Cervical Spine Wo Contrast  Result Date: 07/10/2022 CLINICAL DATA:  Neck trauma (Age >= 65y) EXAM: CT CERVICAL SPINE WITHOUT CONTRAST TECHNIQUE: Multidetector CT imaging of the cervical spine was performed without intravenous contrast. Multiplanar CT image reconstructions were also generated. RADIATION DOSE REDUCTION: This exam was performed according to the departmental dose-optimization program which includes automated exposure control, adjustment of the mA and/or kV according to patient size and/or use of iterative reconstruction technique. COMPARISON:  10/20/2020. FINDINGS: Alignment: Reversed cervical lordosis noted consistent with sequela of degenerative changes versus muscle spasm or positioning. No suggestion of an acute traumatic etiology for this finding. Skull base and vertebrae: No acute fracture. No primary bone lesion or focal pathologic process. Soft tissues and spinal canal: No prevertebral fluid or swelling. No visible canal hematoma. Disc levels: Multilevel disc disease identified with loss of disc spaces and marginal osteophytes extending from C4 through C7. Upper chest: Negative. IMPRESSION: Degenerative changes. There are no acute traumatic abnormalities identified. Electronically Signed   By: Sammie Bench M.D.   On: 07/10/2022 09:30   CT Head Wo Contrast  Result Date: 07/10/2022 CLINICAL DATA:  Head trauma, intracranial arterial injury suspected EXAM: CT HEAD WITHOUT CONTRAST TECHNIQUE: Contiguous axial images were obtained from the base of the skull through the vertex without intravenous contrast. RADIATION DOSE REDUCTION: This exam was performed according to the departmental dose-optimization program which includes automated exposure control, adjustment of the mA  and/or kV according to patient size and/or use of iterative reconstruction technique. COMPARISON:  10/20/2020 FINDINGS: Brain: There is periventricular white matter decreased attenuation consistent with small vessel ischemic changes. Ventricles, sulci and cisterns are prominent consistent with age related involutional changes. No acute intracranial hemorrhage, mass effect or shift. No hydrocephalus. Vascular: No hyperdense vessel or unexpected calcification. Skull: Normal. Negative for fracture or focal lesion. Sinuses/Orbits: Mild chronic left maxillary sinusitis. IMPRESSION: Atrophy and chronic small vessel ischemic changes. No acute intracranial process identified. Electronically Signed   By: Sammie Bench M.D.   On: 07/10/2022 09:27   DG Hip Unilat W or Wo Pelvis  2-3 Views Left  Result Date: 07/10/2022 CLINICAL DATA:  Fall EXAM: DG HIP (WITH OR WITHOUT PELVIS) 3V LEFT COMPARISON:  10/20/2020. FINDINGS: Osseous structures are osteopenic. Left total hip arthroplasty. Prosthesis appears intact without Periprosthetic lucency. There are acute fractures of the inferior and superior pubic rami on the left displaced by a few mm. The pelvic ring appears otherwise intact. CT may be helpful to assess for additional radiographically occult fractures. IMPRESSION: Acute left pubic rami fractures. Status post left total hip arthroplasty prosthesis appears intact. Electronically Signed   By: Sammie Bench M.D.   On: 07/10/2022 09:22   DG Chest Port 1 View  Result Date: 07/10/2022 CLINICAL DATA:  Fall EXAM: PORTABLE CHEST 1 VIEW COMPARISON:  01/06/2022 FINDINGS: The cardiac silhouette appears prominent. There is pulmonary vascular congestion. There is minimal left basilar consolidation or volume loss. There may be small left-sided pleural effusion. No pneumothorax identified. Aorta is calcified. There are thoracic degenerative changes. IMPRESSION: Minimal left basilar consolidation and possible effusion. Pulmonary  vascular congestion. Electronically Signed   By: Sammie Bench M.D.   On: 07/10/2022 09:19    Orson Eva, DO  [facility]  If 7PM-7AM, please contact night-coverage [email or web address] Password [email or web address] 07/22/2022, 11:21 AM   LOS: 12 days

## 2022-07-22 NOTE — Progress Notes (Signed)
Daily Progress Note   Patient Name: Sheena [name]       Date: 07/22/2022 DOB: 08/30/1923  Age: 88 y.o. MRN#: [id number] Attending Physician: Sheena Eva, MD Primary Care Physician: Sheena Meiers, MD Admit Date: 07/10/2022 Length of Stay: 12 days  Reason for Consultation/Follow-up: Establishing goals of care  HPI/Patient Profile:  88 y.o. female  with past medical history of depression/demention, HTN , DM2.Marland Kitchen  COPD admitted on 07/10/2022 from [facility] [place] [facility] facility with Lt hip pain after mechanical fall and found to have pubic rami fractures, rib fracture, also found to + covid, worsening hypoxia. During hospitalization she has had worsening respiratory status and increasing oxygen needs.   PMT was consulted for [place] conversations.  Subjective:   Subjective: Chart Reviewed. Updates received. Patient Assessed. Created space and opportunity for patient  and family to explore thoughts and feelings regarding current medical situation.  Today's Discussion: Today I saw the patient at the bedside, no family was present.  She states she is somewhat short of breath and is also tired.  I excuse myself to allow her to rest.  I received an update from the nurse she stated that she had a desat to 80% with simple assessment, questions, and medications in applesauce.  He also notes that she desatted into the 21s yesterday with a bath.  It seems that with minimal activity, stimulation she has desat despite oxygen requiring increasing oxygen up to 10 L/min.  When she is not being stimulated and no activity she does seem to tolerate closer to 4 to 6 L of oxygen.  Later in the day I called and spoke with the patient's grandson/HCPOA Sheena [name].  I explained clinical update where she is not getting worse but she is also knows not getting substantially better.  I explained the issues with desaturation with minimal exertion or activity requiring increased oxygen needs.  We discussed that she  appears to be having a hard time getting over her COVID and pneumonia given her history (specifically COPD) and advanced age.  He verbalized understanding.  We discussed options moving forward including continued aggressive medical care with allowing more time for her to declare herself.  Another option would be to consider transition to comfort care given no improvement.  We discussed, and he agreed, that they remain on board for DNR and no escalation of care if she declines and for significant decline would likely transition to comfort care.  He states he will call and discussed all this with his family.  He feels that they will likely request a few more days for outcomes.  He feels a good timeline would be to follow-up with a family meeting on Monday (in person versus telephonic versus next) for another update and to start making decisions on how to proceed.  I offered that palliative medicine has an on-call provider throughout the weekend, the hospitalist is on 24/7 throughout the weekend and he can call for any questions or concerns.  I confirmed he does have the palliative medicine team contact information.  I encouraged him to reach out for any questions.   I provided emotional and general support through therapeutic listening, empathy, sharing of stories, and other techniques. I answered all questions and addressed all concerns to the best of my ability.  Review of Systems  Constitutional:  Positive for fatigue.       Denies pain in general  Respiratory:  Positive for shortness of breath.   Gastrointestinal:  Negative for nausea and  vomiting.    Objective:   Vital Signs:  BP (!) 135/38   Pulse 77   Temp 98.2 F (36.8 C) (Oral)   Resp (!) 25   Ht '5\' 6"'$  (1.676 m)   Wt 76.6 kg   SpO2 95%   BMI 27.26 kg/m   Physical Exam: Physical Exam Vitals and nursing note reviewed.  Constitutional:      General: She is not in acute distress.    Appearance: She is ill-appearing.  HENT:      Head: Normocephalic and atraumatic.  Cardiovascular:     Rate and Rhythm: Normal rate.  Pulmonary:     Effort: Pulmonary effort is normal. No respiratory distress.     Comments: Does appear dyspneic and tachypneic with talking, activity Abdominal:     General: Abdomen is flat. Bowel sounds are normal. There is no distension.     Palpations: Abdomen is soft.  Skin:    General: Skin is warm and dry.  Neurological:     Mental Status: She is alert.  Psychiatric:        Mood and Affect: Mood normal.        Behavior: Behavior normal.     Palliative Assessment/Data: 20-30%    Existing Vynca/ACP Documentation: None  Assessment & Plan:   Impression: Present on Admission:  Closed fracture of multiple pubic rami, left, initial encounter (HCC)  COPD (chronic obstructive pulmonary disease) (HCC)  Hypertension  88 year old female status post fall with fractures likely requiring rehab.  She tested positive for COVID-19 and developed pneumonia with worsening oxygen needs.  Her oxygen needs have been stable at 9 to 10 L/min for the past several days.  Family is aware of her predicament.  They seem amendable to comfort care if she deteriorates.  I confirmed with Sheena [name] (grandson/HCPOA) to continue current care, no escalation of care, remain DNR.  Today patient continues to have desaturations with minimal activity/exertion requiring oxygen back up to 8-10 lpm.  With no activity, conversation, stimulation and then she typically tolerates 4 to 6 L/min high flow nasal cannula.  She seems to be stuck in a "purgatory" type situation where she is not improving but not worsening.  Overall this would portend a expected decline.  Grandson/HCPOA is updated, plans for likely check in with update/decision making on Monday after a few more days for her to declare herself.  Overall prognosis is poor.   SUMMARY OF RECOMMENDATIONS   Remain DNR Continue current scope of care No escalation of care If  deterioration, family seems agreeable to comfort care Plan follow-up with family on Monday for family meeting, update, begin making decisions PMT will continue to follow, plan in person follow-up tomorrow Continued emotional and spiritual support of the patient and family  Symptom Management:  Per primary team PMT is available to assist as needed, especially if transition to comfort care   Code Status: DNR  Prognosis: Unable to determine  Discharge Planning: To Be Determined  Discussed with: Patient, patient's family, medical team, nursing team  Thank you for allowing [place] to participate in the care of SOLE LENGACHER PMT will continue to support holistically.  Time Total: 75 min  Visit consisted of counseling and education dealing with the complex and emotionally intense issues of symptom management and palliative care in the setting of serious and potentially life-threatening illness. Greater than 50%  of this time was spent counseling and coordinating care related to the above assessment and plan.  Sheena Field, NP Palliative  Medicine Team  Team Phone # [phone] (Nights/Weekends)  04/08/2021, 8:17 AM

## 2022-07-22 NOTE — Progress Notes (Signed)
OT Cancellation Note  Patient Details Name: Sheena [name] MRN: [id number] DOB: Apr 20, 1924   Cancelled Treatment:    Reason Eval/Treat Not Completed: Medical issues which prohibited therapy. Pt transferred to a higher level of care and weill need new OT consult to resume therapy when pt is medically stable. Thank you.   Nellene Courtois OT, MOT   Larey Seat 07/22/2022, 8:11 AM

## 2022-07-22 NOTE — Progress Notes (Signed)
Patient initially on 3L  but had to increase to 6L. While engaging patient in short conversation for assessment and administering morning medications, patients oxygen dropped to 80% while on 3 liters. Increased to 6L nasal canula to maintain oxygen sat above 90%. Will titrate oxygen back down as able to do so.

## 2022-07-23 LAB — BASIC METABOLIC PANEL
Anion gap: 6 (ref 5–15)
BUN: 46 mg/dL — ABNORMAL HIGH (ref 8–23)
CO2: 24 mmol/L (ref 22–32)
Calcium: 7.7 mg/dL — ABNORMAL LOW (ref 8.9–10.3)
Chloride: 113 mmol/L — ABNORMAL HIGH (ref 98–111)
Creatinine, Ser: 1.16 mg/dL — ABNORMAL HIGH (ref 0.44–1.00)
GFR, Estimated: 43 mL/min — ABNORMAL LOW (ref >60)
Glucose, Bld: 191 mg/dL — ABNORMAL HIGH (ref 70–99)
Potassium: 3.9 mmol/L (ref 3.5–5.1)
Sodium: 143 mmol/L (ref 135–145)

## 2022-07-23 LAB — GLUCOSE, CAPILLARY
Glucose-Capillary: 100 mg/dL — ABNORMAL HIGH (ref 70–99)
Glucose-Capillary: 126 mg/dL — ABNORMAL HIGH (ref 70–99)
Glucose-Capillary: 126 mg/dL — ABNORMAL HIGH (ref 70–99)
Glucose-Capillary: 167 mg/dL — ABNORMAL HIGH (ref 70–99)
Glucose-Capillary: 256 mg/dL — ABNORMAL HIGH (ref 70–99)
Glucose-Capillary: 421 mg/dL — ABNORMAL HIGH (ref 70–99)

## 2022-07-23 LAB — MAGNESIUM: Magnesium: 2.7 mg/dL — ABNORMAL HIGH (ref 1.7–2.4)

## 2022-07-23 MED ORDER — GLYCOPYRROLATE 0.2 MG/ML IJ SOLN: 0.2000 mg | INTRAMUSCULAR | Status: DC | PRN

## 2022-07-23 MED ORDER — MORPHINE SULFATE (PF) 2 MG/ML IV SOLN
1.0000 mg | INTRAVENOUS | Status: DC | PRN
  Administered 2022-07-23 – 2022-07-24 (×4): 1 mg via INTRAVENOUS
  Filled 2022-07-23 (×4): qty 1

## 2022-07-23 MED ORDER — HALOPERIDOL 0.5 MG PO TABS: 0.5000 mg | ORAL_TABLET | ORAL | Status: DC | PRN

## 2022-07-23 MED ORDER — HALOPERIDOL LACTATE 2 MG/ML PO CONC: 0.5000 mg | ORAL | Status: DC | PRN

## 2022-07-23 MED ORDER — POLYVINYL ALCOHOL 1.4 % OP SOLN: 1.0000 [drp] | Freq: Four times a day (QID) | OPHTHALMIC | Status: DC | PRN

## 2022-07-23 MED ORDER — HALOPERIDOL LACTATE 5 MG/ML IJ SOLN: 0.5000 mg | INTRAMUSCULAR | Status: DC | PRN

## 2022-07-23 MED ORDER — LORAZEPAM 2 MG/ML IJ SOLN
1.0000 mg | INTRAMUSCULAR | Status: DC | PRN
  Administered 2022-07-24: 1 mg via INTRAVENOUS
  Filled 2022-07-23: qty 1

## 2022-07-23 MED ORDER — ALPRAZOLAM 0.5 MG PO TABS: 0.5000 mg | ORAL_TABLET | Freq: Three times a day (TID) | ORAL | Status: DC | PRN

## 2022-07-23 MED ORDER — GLYCOPYRROLATE 1 MG PO TABS: 1.0000 mg | ORAL_TABLET | ORAL | Status: DC | PRN

## 2022-07-23 MED ORDER — BIOTENE DRY MOUTH MT LIQD: 15.0000 mL | OROMUCOSAL | Status: DC | PRN

## 2022-07-23 NOTE — Progress Notes (Signed)
Daily Progress Note   Patient Name: Sheena [name]       Date: 07/23/2022 DOB: Jun 06, 1924  Age: 88 y.o. MRN#: [id number] Attending Physician: Orson Eva, MD Primary Care Physician: Carrolyn Meiers, MD Admit Date: 07/10/2022 Length of Stay: 13 days  Reason for Consultation/Follow-up: Establishing goals of care  HPI/Patient Profile:  88 y.o. female  with past medical history of depression/demention, HTN , DM2.Marland Kitchen  COPD admitted on 07/10/2022 from [facility] [place] assisted living facility with Lt hip pain after mechanical fall and found to have pubic rami fractures, rib fracture, also found to + covid, worsening hypoxia. During hospitalization she has had worsening respiratory status and increasing oxygen needs.   PMT was consulted for [place] conversations.  Subjective:   Subjective: Chart Reviewed. Updates received. Patient Assessed. Created space and opportunity for patient  and family to explore thoughts and feelings regarding current medical situation.  Today's Discussion: Today I saw the patient at the bedside, her grandson who lives locally Sheena [name]) was present.  She states she is somewhat short of breath and is also tired.  I excuse myself to allow her to visit with her grandson.  I received an update from the nurse, he stated that the patient did have a brief episode of desaturation requiring increasing oxygen to 6 L/min.  Otherwise has been on 3 to 4 L/min.  Later I received a message for callback from the patient's grandson/HCPOA Sheena [name]. I gave him a clinical update. He states he has spoken with his family yesterday and today, just spoke with Dr. Carles Collet. Family is in agreement to transition to comfort care at this time. I explained comfort care as the patient would no longer receive aggressive medical interventions such as continuous vital signs, lab work, radiology testing, or medications not focused on comfort. All care would focus on how the patient is looking and feeling. This would  include management of any symptoms that may cause discomfort, pain, shortness of breath, cough, nausea, agitation, anxiety, and/or secretions etc. Symptoms would be managed with medications and other non-pharmacological interventions such as spiritual support if requested, repositioning, music therapy, or therapeutic listening. He verbalized understanding and appreciation.   He shares that in speaking with other family they feel she has had a good, long life. They share that the patient has not been the person they know for several years, she is just a "shell of the grandma I knew." She has missed her husband terribly these past few years and while they are sad she is near end of life, they are glad she will be reunited with him.  I offered that palliative medicine has an on-call provider throughout the weekend, the hospitalist is on 24/7 throughout the weekend and he can call for any questions or concerns.  I confirmed he does have the palliative medicine team contact information and nursing unit contact information.  I encouraged him to reach out for any questions.  I provided emotional and general support through therapeutic listening, empathy, sharing of stories, and other techniques. I answered all questions and addressed all concerns to the best of my ability.  Review of Systems  Constitutional:  Positive for fatigue.       Denies pain in general  Respiratory:  Positive for shortness of breath.   Gastrointestinal:  Negative for nausea and vomiting.    Objective:   Vital Signs:  BP (!) 145/64   Pulse 84   Temp 97.9 F (36.6 C) (Axillary)   Resp (!) 21  Ht '5\' 6"'$  (1.676 m)   Wt 76.2 kg   SpO2 100%   BMI 27.11 kg/m   Physical Exam: Physical Exam Vitals and nursing note reviewed.  Constitutional:      General: She is not in acute distress.    Appearance: She is ill-appearing.  HENT:     Head: Normocephalic and atraumatic.  Cardiovascular:     Rate and Rhythm: Normal rate.   Pulmonary:     Effort: Pulmonary effort is normal. No respiratory distress.     Comments: Does appear dyspneic and tachypneic with talking, activity Abdominal:     General: Abdomen is flat. Bowel sounds are normal. There is no distension.     Palpations: Abdomen is soft.  Skin:    General: Skin is warm and dry.  Neurological:     Mental Status: She is alert.  Psychiatric:        Mood and Affect: Mood normal.        Behavior: Behavior normal.     Palliative Assessment/Data: 20-30%    Existing Vynca/ACP Documentation: None  Assessment & Plan:   Impression: Present on Admission:  Closed fracture of multiple pubic rami, left, initial encounter (HCC)  COPD (chronic obstructive pulmonary disease) (HCC)  Hypertension  Acute on chronic respiratory failure with hypoxia ([place])  88 year old female status post fall with fractures likely requiring rehab.  She tested positive for COVID-19 and developed pneumonia with worsening oxygen needs.  Her oxygen needs have been stable at 9 to 10 L/min for the past several days.  Family is aware of her predicament.  They seem amendable to comfort care if she deteriorates.  Today they have elected to make her comfort care. Overall prognosis is grave.   SUMMARY OF RECOMMENDATIONS   Remain DNR Transition to comfort care No escalation of care PMT will follow-up on Monday when back on service if patient is still here Continued emotional and spiritual support of the patient and family  Symptom Management:  Tylenol 650 mg every 6 hours as needed mild pain or fever Robinul 0.2 mg every 4 hours as needed excessive secretions Haldol 0.5 mg IV every 4 hours as needed agitation or delirium Ativan 1 mg every 4 hours as needed anxiety or fear Increased morphine 1 mg every hour as needed severe pain or dyspnea Zofran 4 mg IV every 6 hours as needed nausea or vomiting Polyvinyl alcohol 1.4% ophthalmic 1 drop OU 4 times daily as needed dry eyes  Code  Status: DNR  Prognosis: Unable to determine  Discharge Planning: To Be Determined  Discussed with: Patient, patient's family, medical team, nursing team  Thank you for allowing [place] to participate in the care of Sheena [name] PMT will continue to support holistically.  Time Total: 90 min  Visit consisted of counseling and education dealing with the complex and emotionally intense issues of symptom management and palliative care in the setting of serious and potentially life-threatening illness. Greater than 50%  of this time was spent counseling and coordinating care related to the above assessment and plan.  Walden Field, NP Palliative Medicine Team  Team Phone # [phone] (Nights/Weekends)  04/08/2021, 8:17 AM

## 2022-07-23 NOTE — Progress Notes (Signed)
PROGRESS NOTE  Sheena [name] PPJ:[id number] DOB: January 22, 1924 DOA: 07/10/2022 PCP: Carrolyn Meiers, MD  Brief History:  88 y.o. female with pmhx relevant for depression/demention, HTN , DM2.Marland Kitchen  COPD admitted on 07/10/2022 from [facility] with Lt hip pain after mechanical fall and found to have pubic rami fractures, rib fracture, also found to + covid, worsening hypoxia.  She required up to 10L HFNC.  She was started on IV steroid and completed a course of Paxlovid.  She continues to remain deconditioned with desaturation with minimal activity and eating.   Assessment/Plan: COVID-19 infection with acute on chronic hypoxic respiratory failure with hypoxia- -she was tested + on 12/4 -reports on home o2 at [facility] for a few years  -IV steroids and bronchodilators as ordered -Paxlovid on 07/13/22-07/18/22 -increased oxygen requirement on 12/7, repeat cxr showed worsening of pneumonia,  started back on abx, continue steroids,  prn iv lasix -o2 requirement up to 10 liters for the last few days, she does not appear in significant respiratory distress, crp slowing coming down -discussed with daughter [name] and HPOA [name] may consider comfort measures if she deteriorate, they are in agreement -check CRP 7.8>>12.4 -check D-dimer 7.95 -CTA chest--no PE.  Bilateral multifocal airspace disease -PCT <5.80   Acute metabolic Encephalopathy -due to hypoxia, infection, UTI   Klebsiella UTI ,POA -finished treatment with IV Rocephin -last dose 07/15/2022   Acute Left pubic rami fractures----after mechanical fall (POA) -Outpatient follow-up with orthopedic  -Pain control appears adequate -SNF placement was the initial plan but may change depending on palliative discussions    Lt sided rib fractures (POA) from the fall, does not appear in pain    Controlled Diabetes mellitus type 2 -07/10/22 A1C--5.9 -episodes of hypoglycemia , insulin dose decrease, continue to  adjust insulin dose as needed -currently on steroids   HTN- bp stable    Anemia of chronic disease Hgb is down to 7.1 from 9.8 - no bleeding concerns at this time monitor closely -check anemia panel   Dementia and depression--- continue trazodone as needed, continue Zoloft 75 mg daily and Seroquel 12.5 mg No agitation, no oriented to time ,but to place and person    Recurrent Falls--- at baseline patient ambulates with a walker from ALF -presents with  a mechanical fall- -CT head and CT C-spine without acute findings PTA pt lived at [facility]F and did very poorly, patient has significant limitations with mobility related ADLs- this patient needs to continue to be monitored in the hospital until a SNF bed is obtained as she is not safe to go home with her current physical limitations -daughter desires palliative care to follow patient at snf if able to discharge   [place] -discussed with daughter and grandson (HPOA)  -discussed pt's frailty and FTT and poor po intake and poor baseline function -discussed pt's slow and lack of improvement clinically and prolonged hospitalization -discussed that patient will not likely return to her baseline functional state -they agreed to transition patient's focus of care to comfort measures         Family Communication:   daughter and grandson updated 12/14   Consultants:  palliative   Code Status:  FULL COMFORT   DVT Prophylaxis:  FULL COMFORT     Procedures: As Listed in Progress Note Above   Antibiotics: None     Subjective: Pt complains of sob and pain all over.  Very little po intake.  ROS limited due to confusion.    Objective: Vitals:   07/23/22 0800 07/23/22 0900 07/23/22 1000 07/23/22 1112  BP: (!) 106/59 (!) 151/45 (!) 145/64   Pulse: 86 91 84   Resp: (!) 23 19 (!) 21   Temp:  (!) 97.4 F (36.3 C)  97.9 F (36.6 C)  TempSrc:  Axillary  Axillary  SpO2: 96% (!) 87% 100%   Weight:      Height:         Intake/Output Summary (Last 24 hours) at 07/23/2022 1119 Last data filed at 07/22/2022 1900 Gross per 24 hour  Intake 120 ml  Output 450 ml  Net -330 ml   Weight change: -0.4 kg Exam:  General:  Pt is alert, doe snot followscommands appropriately, not in acute distress HEENT: No icterus, No thrush, No neck mass, [place]/AT Cardiovascular: RRR, S1/S2, no rubs, no gallops Respiratory: bilateral rales. No wheeze Abdomen: Soft/+BS, non tender, non distended, no guarding Extremities: No edema, No lymphangitis, No petechiae, No rashes, no synovitis   Data Reviewed: I have personally reviewed following labs and imaging studies Basic Metabolic Panel: Recent Labs  Lab 07/17/22 0535 07/18/22 0501 07/19/22 0333 07/20/22 0311 07/21/22 0409 07/22/22 1318 07/23/22 0720  NA 139   < > 140 142 146* 146* 143  K 3.5   < > 3.7 4.3 4.8 4.0 3.9  CL 104   < > 105 107 114* 109 113*  CO2 26   < > '26 28 24 25 24  '$ GLUCOSE 214*   < > 227* 274* 179* 171* 191*  BUN 48*   < > 63* 73* 53* 50* 46*  CREATININE 1.33*   < > 1.25* 1.28* 1.00 1.07* 1.16*  CALCIUM 7.4*   < > 7.5* 7.7* 7.8* 8.0* 7.7*  MG 2.4  --   --  2.9*  --  2.6* 2.7*  PHOS 4.5  --   --  3.3  --   --   --    < > = values in this interval not displayed.   Liver Function Tests: Recent Labs  Lab 07/18/22 0501  AST 27  ALT 20  ALKPHOS 98  BILITOT 0.9  PROT 6.8  ALBUMIN 2.3*   No results for input(s): "LIPASE", "AMYLASE" in the last 168 hours. No results for input(s): "AMMONIA" in the last 168 hours. Coagulation Profile: No results for input(s): "INR", "PROTIME" in the last 168 hours. CBC: Recent Labs  Lab 07/17/22 0535 07/18/22 0501 07/19/22 0333 07/21/22 0409 07/22/22 1318  WBC 10.2 12.1* 12.3* 15.1* 26.4*  NEUTROABS 9.0* 10.8* 11.1*  --   --   HGB 7.2* 7.4* 7.5* 7.8* 7.6*  HCT 22.6* 23.4* 23.5* 25.9* 24.4*  MCV 89.0 89.3 90.4 95.9 92.4  PLT 158 175 185 185 236   Cardiac Enzymes: No results for input(s): "CKTOTAL",  "CKMB", "CKMBINDEX", "TROPONINI" in the last 168 hours. BNP: Invalid input(s): "POCBNP" CBG: Recent Labs  Lab 07/22/22 2152 07/22/22 2213 07/23/22 0000 07/23/22 0453 07/23/22 0803  GLUCAP 410* 422* 421* 256* 167*   HbA1C: No results for input(s): "HGBA1C" in the last 72 hours. Urine analysis:    Component Value Date/Time   COLORURINE AMBER (A) 07/10/2022 1137   APPEARANCEUR HAZY (A) 07/10/2022 1137   APPEARANCEUR Clear 03/05/2014 2034   LABSPEC 1.016 07/10/2022 1137   LABSPEC 1.010 03/05/2014 2034   PHURINE 5.0 07/10/2022 1137   GLUCOSEU NEGATIVE 07/10/2022 1137   GLUCOSEU Negative 03/05/2014 2034   HGBUR NEGATIVE 07/10/2022 1137  BILIRUBINUR NEGATIVE 07/10/2022 1137   BILIRUBINUR Negative 03/05/2014 2034   KETONESUR 5 (A) 07/10/2022 1137   PROTEINUR 100 (A) 07/10/2022 1137   UROBILINOGEN 0.2 06/13/2013 1148   NITRITE POSITIVE (A) 07/10/2022 1137   LEUKOCYTESUR LARGE (A) 07/10/2022 1137   LEUKOCYTESUR 1+ 03/05/2014 2034   Sepsis Labs: '@LABRCNTIP'$ (procalcitonin:4,lacticidven:4) ) Recent Results (from the past 240 hour(s))  SARS Coronavirus 2 by RT PCR (hospital order, performed in [facility] lab) *cepheid single result test* Anterior Nasal Swab     Status: Abnormal   Collection Time: 07/13/22  1:50 PM   Specimen: Anterior Nasal Swab  Result Value Ref Range Status   SARS Coronavirus 2 by RT PCR POSITIVE (A) NEGATIVE Final    Comment: (NOTE) SARS-CoV-2 target nucleic acids are DETECTED  SARS-CoV-2 RNA is generally detectable in upper respiratory specimens  during the acute phase of infection.  Positive results are indicative  of the presence of the identified virus, but do not rule out bacterial infection or co-infection with other pathogens not detected by the test.  Clinical correlation with patient history and  other diagnostic information is necessary to determine patient infection status.  The expected result is negative.  Fact Sheet for Patients:    https://www.patel.info/   Fact Sheet for Healthcare Providers:   https://hall.com/    This test is not yet approved or cleared by the [place] FDA and  has been authorized for detection and/or diagnosis of SARS-CoV-2 by FDA under an Emergency Use Authorization (EUA).  This EUA will remain in effect (meaning this test can be used) for the duration of  the COVID-19 declaration under Section 564(b)(1)  of the Act, 21 U.S.C. section 360-bbb-3(b)(1), unless the authorization is terminated or revoked sooner.   Performed at [facility], [address], [place], [place] [address]   MRSA Next Gen by PCR, Nasal     Status: Abnormal   Collection Time: 07/18/22  7:57 PM   Specimen: Nasal Mucosa; Nasal Swab  Result Value Ref Range Status   MRSA by PCR Next Gen DETECTED (A) NOT DETECTED Final    Comment: CRITICAL RESULT CALLED TO, READ BACK BY AND VERIFIED WITH: L. HYLTON BY LBASTON AT 1721, 07/19/22. (NOTE) The GeneXpert MRSA Assay (FDA approved for NASAL specimens only), is one component of a comprehensive MRSA colonization surveillance program. It is not intended to diagnose MRSA infection nor to guide or monitor treatment for MRSA infections. Test performance is not FDA approved in patients less than 18 years old. Performed at [facility], [address] [place], [place] [address]      Scheduled Meds:  feeding supplement (GLUCERNA SHAKE)  237 mL Oral TID BM   QUEtiapine  12.5 mg Oral QHS   senna-docusate  1 tablet Oral BID   Continuous Infusions:  Procedures/Studies: CT Angio Chest Pulmonary Embolism (PE) W or WO Contrast  Result Date: 07/22/2022 CLINICAL DATA:  COVID positive, hypoxia, elevated D-dimer EXAM: CT ANGIOGRAPHY CHEST WITH CONTRAST TECHNIQUE: Multidetector CT imaging of the chest was performed using the standard protocol during bolus administration of intravenous contrast. Multiplanar CT image reconstructions and MIPs  were obtained to evaluate the vascular anatomy. RADIATION DOSE REDUCTION: This exam was performed according to the departmental dose-optimization program which includes automated exposure control, adjustment of the mA and/or kV according to patient size and/or use of iterative reconstruction technique. CONTRAST:  197m OMNIPAQUE IOHEXOL 350 MG/ML SOLN COMPARISON:  07/20/2022, 07/10/2022 FINDINGS: Cardiovascular: This is a technically adequate evaluation of the pulmonary  vasculature. No filling defects or pulmonary emboli. Dilated main pulmonary artery consistent with pulmonary arterial hypertension. Stable cardiomegaly without pericardial effusion. Stable aortic atherosclerosis. No aneurysm or dissection. Mediastinum/Nodes: Mediastinal adenopathy likely reactive. Largest lymph node right paratracheal region measures 12 mm in short axis. Thyroid, trachea, and esophagus are unremarkable. Lungs/Pleura: Chronic bilateral bronchiectasis and bibasilar scarring. There is diffuse bilateral airspace disease with relative sparing of the left upper lobe. No effusion or pneumothorax. Central airways are patent. Upper Abdomen: No acute abnormality. Musculoskeletal: Multiple left-sided rib fractures unchanged. Reconstructed images demonstrate no additional findings. Review of the MIP images confirms the above findings. IMPRESSION: 1. No evidence of pulmonary embolus. 2. Multifocal bilateral airspace disease, with relative sparing of the left upper lobe, consistent with bilateral pneumonia. The appearance and pattern is consistent with given history of COVID-19. 3. Reactive mediastinal adenopathy. 4. Dilated main pulmonary artery consistent with pulmonary arterial hypertension. 5. Chronic bilateral bronchiectasis and bibasilar scarring. 6.  Aortic Atherosclerosis (ICD10-I70.0). 7. Stable left-sided rib fractures. Electronically Signed   By: Randa Ngo M.D.   On: 07/22/2022 21:16   DG CHEST PORT 1 VIEW  Result Date:  07/20/2022 CLINICAL DATA:  Acute on chronic respiratory failure EXAM: PORTABLE CHEST 1 VIEW COMPARISON:  Chest radiograph dated 07/16/2022 FINDINGS: Low lung volumes. Head slightly improved right upper lung aeration. Persistent bilateral lower lung patchy and interstitial opacities. Questionable trace bilateral pleural effusions. No pneumothorax. Similar cardiomediastinal silhouette. The visualized skeletal structures are unremarkable. IMPRESSION: 1. Slightly improved right upper lung aeration. 2. Persistent bilateral lower lung patchy and interstitial opacities. 3. Questionable trace bilateral pleural effusions. Electronically Signed   By: Darrin Nipper M.D.   On: 07/20/2022 09:26   US RENAL  Result Date: 07/17/2022 CLINICAL DATA:  Elevated creatinine level. EXAM: RENAL / URINARY TRACT ULTRASOUND COMPLETE COMPARISON:  None Available. FINDINGS: Right Kidney: Renal measurements: 9.3 x 5.1 x 3.6 cm = volume: 89 mL. Cortical thinning is noted. Mildly increased echogenicity of renal parenchyma is noted. No mass or hydronephrosis visualized. Left Kidney: Renal measurements: 8.7 x 5.0 x 4.9 cm = volume: 110 mL. Cortical thinning is noted. Mildly increased echogenicity of renal parenchyma is noted. 1.2 cm simple cyst is seen in upper pole. No mass or hydronephrosis visualized. Bladder: Not well visualized due to lack of distension. Other: None. IMPRESSION: Mildly increased echogenicity of renal parenchyma is noted suggesting medical renal disease. Mild cortical thinning is noted bilaterally as well. No hydronephrosis or renal obstruction is noted. Electronically Signed   By: Marijo Conception M.D.   On: 07/17/2022 11:26   DG CHEST PORT 1 VIEW  Result Date: 07/16/2022 CLINICAL DATA:  Hypoxia. EXAM: PORTABLE CHEST 1 VIEW COMPARISON:  July 10, 2022. FINDINGS: Stable cardiomediastinal silhouette. Increased bilateral lung opacities are noted, right greater than left, concerning for multifocal pneumonia. Continued presence  of mildly displaced left clavicular fracture. IMPRESSION: Significantly increased bilateral lung opacities are noted, right greater than left, concerning for multifocal pneumonia. Electronically Signed   By: Marijo Conception M.D.   On: 07/16/2022 14:25   CT Chest Wo Contrast  Result Date: 07/10/2022 CLINICAL DATA:  Pleural effusion, evaluate for malignancy EXAM: CT CHEST WITHOUT CONTRAST TECHNIQUE: Multidetector CT imaging of the chest was performed following the standard protocol without IV contrast. RADIATION DOSE REDUCTION: This exam was performed according to the departmental dose-optimization program which includes automated exposure control, adjustment of the mA and/or kV according to patient size and/or use of iterative reconstruction technique. COMPARISON:  Previous chest  radiographs including the examination done on 07/10/2022 and CT chest done on 05/01/2010 FINDINGS: Cardiovascular: Scattered calcifications are seen in thoracic aorta and its major branches. There are small scattered coronary artery calcifications are noted. There is dense calcification in the region of mitral annulus. There is ectasia of main pulmonary artery measuring 3.8 cm suggesting pulmonary arterial hypertension. There is ectasia of ascending thoracic aorta measuring 3.8 cm. Mediastinum/Nodes: There is slight interval increase in size of lymph nodes in mediastinum measuring up to 10 mm in short axis. In the previous CT, lymph nodes were seen in the mediastinum measuring up to 8.6 mm in short axis measurement. There is 1.4 cm smooth marginated fluid density nodule in the posterior right lobe of thyroid. There is increase in size since the previous study. No follow-up is recommended. Lungs/Pleura: There is ectasia of bronchi in both lungs, more so in the lower lung fields with interval worsening. Increased interstitial markings are seen in the periphery of both lungs, more so in the lower lung fields with interval progression of  pulmonary fibrosis. There are scattered faint ground-glass densities in mid and lower lung fields. There is no focal consolidation. There is no pleural effusion or pneumothorax. Upper Abdomen: Small hiatal hernia is seen. Scattered calcifications are seen in abdominal aorta. There is 11 mm exophytic low-density lesion in the posterolateral aspect of upper pole of left kidney suggesting possible renal cyst. No follow-up imaging is recommended. Musculoskeletal: Recent undisplaced fractures are seen in the posterior aspects of left second and third ribs. Deformities are noted in the anterior/anterolateral aspect of left second, third and fourth ribs, possibly recent fractures. There is encroachment of neural foramina by bony spurs in the visualized lower cervical spine. Small bony spurs are seen in both shoulders. IMPRESSION: Bronchiectasis. Increased interstitial markings are seen in the periphery of both lungs with interval progression suggesting worsening of pulmonary fibrosis. There are faint ground-glass densities in the mid and lower lung fields suggesting interstitial lung disease or interstitial pneumonia. There is no focal pulmonary consolidation. There is no pleural effusion. There is slight increase in size of lymph nodes in mediastinum, possibly suggesting reactive hyperplasia. There is ectasia of main pulmonary artery suggesting pulmonary arterial hypertension. Scattered coronary artery calcifications are seen. Aortic atherosclerosis. Recent fractures are seen in left second, third and fourth ribs. There is no pneumothorax. Small hiatal hernia. Other findings as described in the body of the report. Electronically Signed   By: Elmer Picker M.D.   On: 07/10/2022 12:29   CT Cervical Spine Wo Contrast  Result Date: 07/10/2022 CLINICAL DATA:  Neck trauma (Age >= 65y) EXAM: CT CERVICAL SPINE WITHOUT CONTRAST TECHNIQUE: Multidetector CT imaging of the cervical spine was performed without intravenous  contrast. Multiplanar CT image reconstructions were also generated. RADIATION DOSE REDUCTION: This exam was performed according to the departmental dose-optimization program which includes automated exposure control, adjustment of the mA and/or kV according to patient size and/or use of iterative reconstruction technique. COMPARISON:  10/20/2020. FINDINGS: Alignment: Reversed cervical lordosis noted consistent with sequela of degenerative changes versus muscle spasm or positioning. No suggestion of an acute traumatic etiology for this finding. Skull base and vertebrae: No acute fracture. No primary bone lesion or focal pathologic process. Soft tissues and spinal canal: No prevertebral fluid or swelling. No visible canal hematoma. Disc levels: Multilevel disc disease identified with loss of disc spaces and marginal osteophytes extending from C4 through C7. Upper chest: Negative. IMPRESSION: Degenerative changes. There are no acute traumatic abnormalities  identified. Electronically Signed   By: Sammie Bench M.D.   On: 07/10/2022 09:30   CT Head Wo Contrast  Result Date: 07/10/2022 CLINICAL DATA:  Head trauma, intracranial arterial injury suspected EXAM: CT HEAD WITHOUT CONTRAST TECHNIQUE: Contiguous axial images were obtained from the base of the skull through the vertex without intravenous contrast. RADIATION DOSE REDUCTION: This exam was performed according to the departmental dose-optimization program which includes automated exposure control, adjustment of the mA and/or kV according to patient size and/or use of iterative reconstruction technique. COMPARISON:  10/20/2020 FINDINGS: Brain: There is periventricular white matter decreased attenuation consistent with small vessel ischemic changes. Ventricles, sulci and cisterns are prominent consistent with age related involutional changes. No acute intracranial hemorrhage, mass effect or shift. No hydrocephalus. Vascular: No hyperdense vessel or unexpected  calcification. Skull: Normal. Negative for fracture or focal lesion. Sinuses/Orbits: Mild chronic left maxillary sinusitis. IMPRESSION: Atrophy and chronic small vessel ischemic changes. No acute intracranial process identified. Electronically Signed   By: Sammie Bench M.D.   On: 07/10/2022 09:27   DG Hip Unilat W or Wo Pelvis 2-3 Views Left  Result Date: 07/10/2022 CLINICAL DATA:  Fall EXAM: DG HIP (WITH OR WITHOUT PELVIS) 3V LEFT COMPARISON:  10/20/2020. FINDINGS: Osseous structures are osteopenic. Left total hip arthroplasty. Prosthesis appears intact without Periprosthetic lucency. There are acute fractures of the inferior and superior pubic rami on the left displaced by a few mm. The pelvic ring appears otherwise intact. CT may be helpful to assess for additional radiographically occult fractures. IMPRESSION: Acute left pubic rami fractures. Status post left total hip arthroplasty prosthesis appears intact. Electronically Signed   By: Sammie Bench M.D.   On: 07/10/2022 09:22   DG Chest Port 1 View  Result Date: 07/10/2022 CLINICAL DATA:  Fall EXAM: PORTABLE CHEST 1 VIEW COMPARISON:  01/06/2022 FINDINGS: The cardiac silhouette appears prominent. There is pulmonary vascular congestion. There is minimal left basilar consolidation or volume loss. There may be small left-sided pleural effusion. No pneumothorax identified. Aorta is calcified. There are thoracic degenerative changes. IMPRESSION: Minimal left basilar consolidation and possible effusion. Pulmonary vascular congestion. Electronically Signed   By: Sammie Bench M.D.   On: 07/10/2022 09:19    Orson Eva, DO  [facility]  If 7PM-7AM, please contact night-coverage [email or web address] Password [email or web address] 07/23/2022, 11:19 AM   LOS: 13 days

## 2022-07-23 NOTE — Inpatient Diabetes Management (Signed)
Inpatient Diabetes Program Recommendations  AACE/ADA: New Consensus Statement on Inpatient Glycemic Control (2015)  Target Ranges:  Prepandial:   less than 140 mg/dL      Peak postprandial:   less than 180 mg/dL (1-2 hours)      Critically ill patients:  140 - 180 mg/dL   Lab Results  Component Value Date   GLUCAP 167 (H) 07/23/2022   HGBA1C 5.9 (H) 07/10/2022    Review of Glycemic Control  Latest Reference Range & Units 07/22/22 11:14 07/22/22 16:10 07/22/22 21:52 07/22/22 22:13 07/23/22 00:00 07/23/22 04:53 07/23/22 08:03  Glucose-Capillary 70 - 99 mg/dL 137 (H) 143 (H) 410 (H) 422 (H) 421 (H) 256 (H) 167 (H)  (H): Data is abnormally high Current orders for Inpatient glycemic control: none   Inpatient Diabetes Program Recommendations:    Noted discontinuation of steroids.   Consider adding Novolog 0-6 units TID & HS if within goals of care.  Thanks, Bronson Curb, MSN, RNC-OB Diabetes Coordinator [phone] (8a-5p)

## 2022-07-24 ENCOUNTER — Inpatient Hospital Stay (HOSPITAL_COMMUNITY)
Admission: RE | Admit: 2022-07-24 | Discharge: 2022-08-10 | DRG: 951 | Disposition: E | Source: Skilled Nursing Facility | Attending: Internal Medicine | Admitting: Internal Medicine

## 2022-07-24 DIAGNOSIS — E785 Hyperlipidemia, unspecified: Secondary | ICD-10-CM | POA: Diagnosis present

## 2022-07-24 DIAGNOSIS — F32A Depression, unspecified: Secondary | ICD-10-CM | POA: Diagnosis present

## 2022-07-24 DIAGNOSIS — G9341 Metabolic encephalopathy: Secondary | ICD-10-CM | POA: Diagnosis present

## 2022-07-24 DIAGNOSIS — D638 Anemia in other chronic diseases classified elsewhere: Secondary | ICD-10-CM | POA: Diagnosis present

## 2022-07-24 DIAGNOSIS — J1282 Pneumonia due to coronavirus disease 2019: Secondary | ICD-10-CM | POA: Diagnosis present

## 2022-07-24 DIAGNOSIS — Z66 Do not resuscitate: Secondary | ICD-10-CM | POA: Diagnosis present

## 2022-07-24 DIAGNOSIS — R627 Adult failure to thrive: Secondary | ICD-10-CM | POA: Diagnosis present

## 2022-07-24 DIAGNOSIS — W1830XD Fall on same level, unspecified, subsequent encounter: Secondary | ICD-10-CM

## 2022-07-24 DIAGNOSIS — E11649 Type 2 diabetes mellitus with hypoglycemia without coma: Secondary | ICD-10-CM | POA: Diagnosis present

## 2022-07-24 DIAGNOSIS — N390 Urinary tract infection, site not specified: Secondary | ICD-10-CM | POA: Diagnosis present

## 2022-07-24 DIAGNOSIS — Z85820 Personal history of malignant melanoma of skin: Secondary | ICD-10-CM

## 2022-07-24 DIAGNOSIS — J9621 Acute and chronic respiratory failure with hypoxia: Secondary | ICD-10-CM | POA: Diagnosis present

## 2022-07-24 DIAGNOSIS — Z8249 Family history of ischemic heart disease and other diseases of the circulatory system: Secondary | ICD-10-CM

## 2022-07-24 DIAGNOSIS — U071 COVID-19: Secondary | ICD-10-CM | POA: Diagnosis present

## 2022-07-24 DIAGNOSIS — Z515 Encounter for palliative care: Principal | ICD-10-CM

## 2022-07-24 DIAGNOSIS — J440 Chronic obstructive pulmonary disease with acute lower respiratory infection: Secondary | ICD-10-CM | POA: Diagnosis present

## 2022-07-24 DIAGNOSIS — R296 Repeated falls: Secondary | ICD-10-CM | POA: Diagnosis present

## 2022-07-24 DIAGNOSIS — Z96642 Presence of left artificial hip joint: Secondary | ICD-10-CM | POA: Diagnosis present

## 2022-07-24 DIAGNOSIS — Z833 Family history of diabetes mellitus: Secondary | ICD-10-CM

## 2022-07-24 DIAGNOSIS — S2242XD Multiple fractures of ribs, left side, subsequent encounter for fracture with routine healing: Secondary | ICD-10-CM

## 2022-07-24 DIAGNOSIS — S32592A Other specified fracture of left pubis, initial encounter for closed fracture: Secondary | ICD-10-CM | POA: Diagnosis not present

## 2022-07-24 DIAGNOSIS — I10 Essential (primary) hypertension: Secondary | ICD-10-CM | POA: Diagnosis present

## 2022-07-24 DIAGNOSIS — Z79899 Other long term (current) drug therapy: Secondary | ICD-10-CM

## 2022-07-24 DIAGNOSIS — F0393 Unspecified dementia, unspecified severity, with mood disturbance: Secondary | ICD-10-CM | POA: Diagnosis present

## 2022-07-24 DIAGNOSIS — Z9981 Dependence on supplemental oxygen: Secondary | ICD-10-CM

## 2022-07-24 DIAGNOSIS — S32592D Other specified fracture of left pubis, subsequent encounter for fracture with routine healing: Secondary | ICD-10-CM

## 2022-07-24 DIAGNOSIS — B961 Klebsiella pneumoniae [K. pneumoniae] as the cause of diseases classified elsewhere: Secondary | ICD-10-CM | POA: Diagnosis present

## 2022-07-24 MED ORDER — MORPHINE 100MG IN NS 100ML (1MG/ML) PREMIX INFUSION
4.0000 mg/h | INTRAVENOUS | Status: DC
  Administered 2022-07-24: 2 mg/h via INTRAVENOUS
  Filled 2022-07-24: qty 100

## 2022-07-24 MED ORDER — HALOPERIDOL LACTATE 5 MG/ML IJ SOLN: 0.5000 mg | INTRAMUSCULAR | Status: DC | PRN

## 2022-07-24 MED ORDER — GLYCOPYRROLATE 0.2 MG/ML IJ SOLN: 0.2000 mg | INTRAMUSCULAR | Status: DC | PRN

## 2022-07-24 MED ORDER — MORPHINE BOLUS VIA INFUSION: 2.0000 mg | INTRAVENOUS | Status: DC | PRN

## 2022-07-24 MED ORDER — ACETAMINOPHEN 325 MG PO TABS: 650.0000 mg | ORAL_TABLET | Freq: Four times a day (QID) | ORAL | Status: DC | PRN

## 2022-07-24 MED ORDER — MORPHINE SULFATE (PF) 2 MG/ML IV SOLN
2.0000 mg | INTRAVENOUS | Status: DC | PRN
  Administered 2022-07-24: 2 mg via INTRAVENOUS
  Filled 2022-07-24: qty 1

## 2022-07-24 MED ORDER — BIOTENE DRY MOUTH MT LIQD: 15.0000 mL | OROMUCOSAL | Status: DC | PRN

## 2022-07-24 MED ORDER — GLYCOPYRROLATE 1 MG PO TABS: 1.0000 mg | ORAL_TABLET | ORAL | Status: DC | PRN

## 2022-07-24 MED ORDER — ACETAMINOPHEN 650 MG RE SUPP: 650.0000 mg | Freq: Four times a day (QID) | RECTAL | Status: DC | PRN

## 2022-07-24 MED ORDER — ONDANSETRON HCL 4 MG/2ML IJ SOLN: 4.0000 mg | Freq: Four times a day (QID) | INTRAMUSCULAR | Status: DC | PRN

## 2022-07-24 MED ORDER — LORAZEPAM 2 MG/ML IJ SOLN
1.0000 mg | INTRAMUSCULAR | Status: DC | PRN
  Administered 2022-07-24: 1 mg via INTRAVENOUS
  Filled 2022-07-24: qty 1

## 2022-07-24 MED ORDER — POLYVINYL ALCOHOL 1.4 % OP SOLN: 1.0000 [drp] | Freq: Four times a day (QID) | OPHTHALMIC | Status: DC | PRN

## 2022-07-24 MED ORDER — HALOPERIDOL LACTATE 2 MG/ML PO CONC: 0.5000 mg | ORAL | Status: DC | PRN

## 2022-07-24 MED ORDER — MORPHINE SULFATE (PF) 2 MG/ML IV SOLN
2.0000 mg | Freq: Once | INTRAVENOUS | Status: AC
  Administered 2022-07-24: 2 mg via INTRAVENOUS
  Filled 2022-07-24: qty 1

## 2022-07-24 MED ORDER — HALOPERIDOL 0.5 MG PO TABS: 0.5000 mg | ORAL_TABLET | ORAL | Status: DC | PRN

## 2022-07-24 MED ORDER — LORAZEPAM 2 MG/ML IJ SOLN
1.0000 mg | Freq: Once | INTRAMUSCULAR | Status: AC
  Administered 2022-07-24: 1 mg via INTRAVENOUS
  Filled 2022-07-24: qty 1

## 2022-07-24 MED ORDER — ONDANSETRON 4 MG PO TBDP: 4.0000 mg | ORAL_TABLET | Freq: Four times a day (QID) | ORAL | Status: DC | PRN

## 2022-08-10 NOTE — TOC Progression Note (Signed)
Transition of Care [facility]) - Progression Note    Patient Details  Name: Sheena [name] MRN: [id number] Date of Birth: 06-14-1924  [facility]) CM/SW Contact  Shade Flood, LCSW Phone Number: 2022-08-05, 12:19 PM  Clinical Narrative:     TOC following. Referred to [facility] at request of MD and family.  Expected Discharge Plan: [place] Barriers to Discharge: Continued Medical Work up  Expected Discharge Plan and Services Expected Discharge Plan: [place] In-house Referral: Clinical Social Work Discharge Planning Services: CM Consult Post Acute Care Choice: [place] Living arrangements for the past 2 months: [place] Expected Discharge Date: 07/13/22                                     Social Determinants of Health (SDOH) Interventions Housing Interventions: Intervention Not Indicated  Readmission Risk Interventions    07/13/2022   11:23 AM  Readmission Risk Prevention Plan  Transportation Screening Complete  PCP or Specialist Appt within 3-5 Days Complete  HRI or [place] Complete  Social Work Consult for [place] Planning/Counseling Complete  Palliative Care Screening Not Applicable  Medication Review Press photographer) Complete

## 2022-08-10 NOTE — H&P (Signed)
History and Physical    Patient: Sheena [name] SWF:[id number] DOB: 1924-01-29 DOA: Aug 20, 2022 DOS: the patient was seen and examined on 08/20/2022 PCP: Carrolyn Meiers, MD  Patient coming from:  Hospital  Chief Complaint: End of life care HPI: Sheena [name] 89 y.o. female with pmhx relevant for depression/demention, HTN , DM2.Marland Kitchen  COPD admitted on 07/10/2022 from [facility] with Lt hip pain after mechanical fall and found to have pubic rami fractures, rib fracture, also found to + covid, worsening hypoxia.  She required up to 10L HFNC.  She was started on IV steroid and completed a course of Paxlovid.  She continues to remain deconditioned with desaturation with minimal activity and eating.  The patient remained hypoxemic.  She had very little reserve.  With minimal activity such as eating or moving in bed, the patient would desaturate her oxygen.  In addition, the patient was very resistant to any kind of movement or therapy secondary to pain.  Her oral intake remained extremely poor.  Workup was done to rule out other treatable causes and metabolic issues.  The patient continued to deteriorate slowly.  Palliative medicine was consulted.  Goals of care discussions were undertaken.  Given the patient's frailty, poor baseline function, and severity of her acute medical issues, it was felt that she had low likelihood to regain any level of function near her baseline previously.  After discussion with the patient's family, he was felt in the best interest to transition the patient to focus care on comfort measures.  [facility].  Unfortunately, the patient had deteriorated and was not stable for transfer to [facility].  She was subsequently transition to general inpatient hospice admission.  Comfort measures were continued.   Review of Systems: As mentioned in the history of present illness. All other systems reviewed and are  negative. Past Medical History:  Diagnosis Date   Asthma    treated by Dr. Donneta Romberg   Depression    Diabetes mellitus, type 2 ([place])    H/O: hysterectomy 1973   fibroids   Hyperlipidemia    Hypertension    Left arm pain    Lumbago    Melanoma ([place])    left arm, resected, annual CXR's ordered   Syncope 08/2013   Suspected vagal-mediated   Past Surgical History:  Procedure Laterality Date   ABDOMINAL HYSTERECTOMY  1973   fibroid uterus   BREAST BIOPSY  1985   normal   JOINT REPLACEMENT   2003   Left Hip   TOTAL HIP ARTHROPLASTY  2003   left, Dr. Durward Fortes   TRANSTHORACIC ECHOCARDIOGRAM  08/23/13   EF 60-65%, borderline concentric LVH, impaired LV diastolic relaxation, LA ULN   Social History:  reports that she has never smoked. She has never used smokeless tobacco. She reports that she does not drink alcohol and does not use drugs.  No Known Allergies  Family History  Problem Relation Age of Onset   Diabetes Mother    Heart disease Mother        CHF   Heart disease Father    Diabetes Father     Prior to Admission medications   Medication Sig Start Date End Date Taking? Authorizing Provider  acetaminophen (TYLENOL) 325 MG tablet Take 2 tablets (650 mg total) by mouth every 6 (six) hours as needed for mild pain, headache or fever. 07/13/22   Roxan Hockey, MD  albuterol (PROVENTIL) (2.5 MG/3ML) 0.083% nebulizer solution Take  3 mLs (2.5 mg total) by nebulization every 6 (six) hours as needed for wheezing or shortness of breath. 07/13/22   Roxan Hockey, MD  albuterol (VENTOLIN HFA) 108 (90 Base) MCG/ACT inhaler Inhale 1 puff into the lungs every 6 (six) hours as needed for wheezing or shortness of breath. 07/13/22   Roxan Hockey, MD  ALPRAZolam [name]) 0.5 MG tablet Take 1 tablet (0.5 mg total) by mouth daily as needed for anxiety or sleep. 07/13/22   Roxan Hockey, MD  insulin glargine (LANTUS SOLOSTAR) 100 UNIT/ML Solostar Pen Inject 15 Units into the skin at  bedtime. 07/13/22   Roxan Hockey, MD  methocarbamol (ROBAXIN) 500 MG tablet Take 1 tablet (500 mg total) by mouth 3 (three) times daily. 07/13/22   Roxan Hockey, MD  OXYGEN Inhale 3 L/min into the lungs continuous.    [provider]  polyethylene glycol (MIRALAX / GLYCOLAX) 17 g packet Take 17 g by mouth daily. 07/14/22   Roxan Hockey, MD  senna-docusate (SENOKOT-S) 8.6-50 MG tablet Take 2 tablets by mouth at bedtime. 07/13/22   Roxan Hockey, MD  Calcium Carbonate (CALCIUM 500 PO) Take by mouth.  04/30/16  [provider]    Physical Exam: HEENT:  /AT, No thrush, no icterus CV:  RRR, no rub, no S3, no S4 Lung: bilateral rales Abd:  soft/+BS, NT Ext:  No edema, no lymphangitis, no synovitis, no rash  Data Reviewed: {Data reviewed in history  Assessment and Plan: COVID-19 infection with acute on chronic hypoxic respiratory failure with hypoxia- -she was tested + on 12/4 -reports on home o2 at [facility] for a few years  -IV steroids and bronchodilators as ordered -Paxlovid on 07/13/22-07/18/22 -increased oxygen requirement on 12/7, repeat cxr showed worsening of pneumonia,  started back on abx, continue steroids,  prn iv lasix -o2 requirement up to 10 liters for the last few days, she does not appear in significant respiratory distress, crp slowing coming down -discussed with daughter [name] and Douglas may consider comfort measures if she deteriorate, they are in agreement -check CRP 7.8>>12.4 -check D-dimer 7.95 -CTA chest--no PE.  Bilateral multifocal airspace disease -PCT <0.10 -No further workup as the patient's focus of care has been transitioned to focus on comfort measures.   Acute metabolic Encephalopathy -due to hypoxia, infection, UTI   Klebsiella UTI ,POA -finished treatment with IV Rocephin -last dose 07/15/2022   Acute Left pubic rami fractures----after mechanical fall (POA) -Outpatient follow-up with orthopedic  -Pain control  appears adequate -SNF placement was the initial plan but may change depending on palliative discussions    Lt sided rib fractures (POA) from the fall, does not appear in pain    Controlled Diabetes mellitus type 2 -07/10/22 A1C--5.9 -episodes of hypoglycemia , insulin dose decrease, continue to adjust insulin dose as needed -currently on steroids   HTN- bp stable    Anemia of chronic disease Hgb is down to 7.1 from 9.8 - no bleeding concerns at this time monitor closely -check anemia panel   Dementia and depression--- continue trazodone as needed, continue Zoloft 75 mg daily and Seroquel 12.5 mg No agitation, no oriented to time ,but to place and person    Recurrent Falls--- at baseline patient ambulates with a walker from ALF -presents with  a mechanical fall- -CT head and CT C-spine without acute findings PTA pt lived at [facility] ALF and did very poorly, patient has significant limitations with mobility related ADLs- this patient needs to continue to be monitored in the  hospital until a SNF bed is obtained as she is not safe to go home with her current physical limitations -daughter desires palliative care to follow patient at snf if able to discharge   [place] -discussed with daughter and grandson (HPOA)  -discussed pt's frailty and FTT and poor po intake and poor baseline function -discussed pt's slow and lack of improvement clinically and prolonged hospitalization -discussed that patient will not likely return to her baseline functional state -they agreed to transition patient's focus of care to comfort measures     Advance Care Planning: FULL COMFORT  Consults: none  Family Communication: none  Severity of Illness: The appropriate patient status for this patient is INPATIENT. Inpatient status is judged to be reasonable and necessary in order to provide the required intensity of service to ensure the patient's safety. The patient's presenting symptoms, physical exam findings,  and initial radiographic and laboratory data in the context of their chronic comorbidities is felt to place them at high risk for further clinical deterioration. Furthermore, it is not anticipated that the patient will be medically stable for discharge from the hospital within 2 midnights of admission.   * I certify that at the point of admission it is my clinical judgment that the patient will require inpatient hospital care spanning beyond 2 midnights from the point of admission due to high intensity of service, high risk for further deterioration and high frequency of surveillance required.*  Author: Orson Eva, MD 07-Aug-2022 3:43 PM  For on call review [email or web address].

## 2022-08-10 NOTE — Discharge Summary (Signed)
Physician Discharge Summary   Patient: Sheena [name] MRN: [id number] DOB: [birth year]  Admit date:     07/10/2022  Discharge date: 08/20/2022  Discharge Physician: Shanon Brow Darleny Sem   PCP: Carrolyn Meiers, MD      Hospital Course: 89 y.o. female with pmhx relevant for depression/demention, HTN , DM2.Marland Kitchen  COPD admitted on 07/10/2022 from [facility] [place] [facility] with Lt hip pain after mechanical fall and found to have pubic rami fractures, rib fracture, also found to + covid, worsening hypoxia.  She required up to 10L HFNC.  She was started on IV steroid and completed a course of Paxlovid.  She continues to remain deconditioned with desaturation with minimal activity and eating.  The patient remained hypoxemic.  She had very little reserve.  With minimal activity such as eating or moving in bed, the patient would desaturate her oxygen.  In addition, the patient was very resistant to any kind of movement or therapy secondary to pain.  Her oral intake remained extremely poor.  Workup was done to rule out other treatable causes and metabolic issues.  The patient continued to deteriorate slowly.  Palliative medicine was consulted.  Goals of care discussions were undertaken.  Given the patient's frailty, poor baseline function, and severity of her acute medical issues, it was felt that she had low likelihood to regain any level of function near her baseline previously.  After discussion with the patient's family, he was felt in the best interest to transition the patient to focus care on comfort measures.  [facility].  Unfortunately, the patient had deteriorated and was not stable for transfer to [place].  She was subsequently transition to general inpatient hospice admission.  Comfort measures were continued.  Assessment and Plan: COVID-19 infection with acute on chronic hypoxic respiratory failure with hypoxia- -she was tested + on 12/4 -reports on home o2 at  [facility] for a few years  -IV steroids and bronchodilators as ordered -Paxlovid on 07/13/22-07/18/22 -increased oxygen requirement on 12/7, repeat cxr showed worsening of pneumonia,  started back on abx, continue steroids,  prn iv lasix -o2 requirement up to 10 liters for the last few days, she does not appear in significant respiratory distress, crp slowing coming down -discussed with daughter [name] and HPOA [name] may consider comfort measures if she deteriorate, they are in agreement -check CRP 7.8>>12.4 -check D-dimer 7.95 -CTA chest--no PE.  Bilateral multifocal airspace disease -PCT <0.10 -No further workup as the patient's focus of care has been transitioned to focus on comfort measures.   Acute metabolic Encephalopathy -due to hypoxia, infection, UTI   Klebsiella UTI ,POA -finished treatment with IV Rocephin -last dose 07/15/2022   Acute Left pubic rami fractures----after mechanical fall (POA) -Outpatient follow-up with orthopedic  -Pain control appears adequate -SNF placement was the initial plan but may change depending on palliative discussions    Lt sided rib fractures (POA) from the fall, does not appear in pain    Controlled Diabetes mellitus type 2 -07/10/22 A1C--5.9 -episodes of hypoglycemia , insulin dose decrease, continue to adjust insulin dose as needed -currently on steroids   HTN- bp stable    Anemia of chronic disease Hgb is down to 7.1 from 9.8 - no bleeding concerns at this time monitor closely -check anemia panel   Dementia and depression--- continue trazodone as needed, continue Zoloft 75 mg daily and Seroquel 12.5 mg No agitation, no oriented to time ,but to place and person  Recurrent Falls--- at baseline patient ambulates with a walker from ALF -presents with  a mechanical fall- -CT head and CT C-spine without acute findings PTA pt lived at [facility] and did very poorly, patient has significant limitations with mobility related ADLs- this  patient needs to continue to be monitored in the hospital until a SNF bed is obtained as she is not safe to go home with her current physical limitations -daughter desires palliative care to follow patient at snf if able to discharge   [place] -discussed with daughter and grandson (HPOA)  -discussed pt's frailty and FTT and poor po intake and poor baseline function -discussed pt's slow and lack of improvement clinically and prolonged hospitalization -discussed that patient will not likely return to her baseline functional state -they agreed to transition patient's focus of care to comfort measures         Consultants: palliative Procedures performed: none  Disposition: Hospice care Diet recommendation:  Discharge Diet Orders (From admission, onward)     Start     Ordered   07/13/22 0000  Diet - low sodium heart healthy        07/13/22 1401   07/13/22 0000  Diet Carb Modified        07/13/22 1401           Regular diet DISCHARGE MEDICATION: Allergies as of 07/31/2022   No Known Allergies      Medication List     STOP taking these medications    Allergy Relief 25 MG tablet Generic drug: diphenhydrAMINE   amLODipine 5 MG tablet Commonly known as: NORVASC   atorvastatin 40 MG tablet Commonly known as: LIPITOR   furosemide 20 MG tablet Commonly known as: LASIX   Geri-Tussin DM 10-100 MG/5ML liquid Generic drug: dextromethorphan-guaiFENesin   guaiFENesin 600 MG 12 hr tablet Commonly known as: MUCINEX   levofloxacin 750 MG tablet Commonly known as: Levaquin   loratadine 10 MG tablet Commonly known as: Claritin   ondansetron 4 MG disintegrating tablet Commonly known as: Zofran ODT   PRESERVISION AREDS 2 PO   QUEtiapine 25 MG tablet Commonly known as: SEROQUEL   sertraline 50 MG tablet Commonly known as: ZOLOFT   tiotropium 18 MCG inhalation capsule Commonly known as: Spiriva HandiHaler   triamcinolone cream 0.1 % Commonly known as: KENALOG        TAKE these medications    acetaminophen 325 MG tablet Commonly known as: TYLENOL Take 2 tablets (650 mg total) by mouth every 6 (six) hours as needed for mild pain, headache or fever.   albuterol 108 (90 Base) MCG/ACT inhaler Commonly known as: VENTOLIN HFA Inhale 1 puff into the lungs every 6 (six) hours as needed for wheezing or shortness of breath.   albuterol (2.5 MG/3ML) 0.083% nebulizer solution Commonly known as: PROVENTIL Take 3 mLs (2.5 mg total) by nebulization every 6 (six) hours as needed for wheezing or shortness of breath.   ALPRAZolam 0.5 MG tablet Commonly known as: XANAX Take 1 tablet (0.5 mg total) by mouth daily as needed for anxiety or sleep. What changed: reasons to take this   Lantus SoloStar 100 UNIT/ML Solostar Pen Generic drug: insulin glargine Inject 15 Units into the skin at bedtime. What changed:  how much to take when to take this   methocarbamol 500 MG tablet Commonly known as: ROBAXIN Take 1 tablet (500 mg total) by mouth 3 (three) times daily.   OXYGEN Inhale 3 L/min into the lungs continuous.   polyethylene glycol  17 g packet Commonly known as: MIRALAX / GLYCOLAX Take 17 g by mouth daily.   senna-docusate 8.6-50 MG tablet Commonly known as: Senokot-S Take 2 tablets by mouth at bedtime.        Contact information for follow-up providers     Carole Civil, MD. Call in 2 week(s).   Specialties: Orthopedic Surgery, Radiology Contact information: [address] [place] [address] [phone]              Contact information for after-discharge care     [facility] .   Service: Skilled Nursing Contact information: [address] [phone]                    Discharge Exam: Danley Danker Weights   07/19/22 0421 07/22/22 0522 07/23/22 0400  Weight: 76.8 kg 76.6 kg 76.2 kg   HEENT:  [place]/AT, No thrush, no  icterus CV:  RRR, no rub, no S3, no S4 Lung:  bilateral rales Abd:  soft/+BS, NT Ext:  No edema, no lymphangitis, no synovitis, no rash   Condition at discharge: stable  The results of significant diagnostics from this hospitalization (including imaging, microbiology, ancillary and laboratory) are listed below for reference.   Imaging Studies: CT Angio Chest Pulmonary Embolism (PE) W or WO Contrast  Result Date: 07/22/2022 CLINICAL DATA:  COVID positive, hypoxia, elevated D-dimer EXAM: CT ANGIOGRAPHY CHEST WITH CONTRAST TECHNIQUE: Multidetector CT imaging of the chest was performed using the standard protocol during bolus administration of intravenous contrast. Multiplanar CT image reconstructions and MIPs were obtained to evaluate the vascular anatomy. RADIATION DOSE REDUCTION: This exam was performed according to the departmental dose-optimization program which includes automated exposure control, adjustment of the mA and/or kV according to patient size and/or use of iterative reconstruction technique. CONTRAST:  137m OMNIPAQUE IOHEXOL 350 MG/ML SOLN COMPARISON:  07/20/2022, 07/10/2022 FINDINGS: Cardiovascular: This is a technically adequate evaluation of the pulmonary vasculature. No filling defects or pulmonary emboli. Dilated main pulmonary artery consistent with pulmonary arterial hypertension. Stable cardiomegaly without pericardial effusion. Stable aortic atherosclerosis. No aneurysm or dissection. Mediastinum/Nodes: Mediastinal adenopathy likely reactive. Largest lymph node right paratracheal region measures 12 mm in short axis. Thyroid, trachea, and esophagus are unremarkable. Lungs/Pleura: Chronic bilateral bronchiectasis and bibasilar scarring. There is diffuse bilateral airspace disease with relative sparing of the left upper lobe. No effusion or pneumothorax. Central airways are patent. Upper Abdomen: No acute abnormality. Musculoskeletal: Multiple left-sided rib fractures unchanged.  Reconstructed images demonstrate no additional findings. Review of the MIP images confirms the above findings. IMPRESSION: 1. No evidence of pulmonary embolus. 2. Multifocal bilateral airspace disease, with relative sparing of the left upper lobe, consistent with bilateral pneumonia. The appearance and pattern is consistent with given history of COVID-19. 3. Reactive mediastinal adenopathy. 4. Dilated main pulmonary artery consistent with pulmonary arterial hypertension. 5. Chronic bilateral bronchiectasis and bibasilar scarring. 6.  Aortic Atherosclerosis (ICD10-I70.0). 7. Stable left-sided rib fractures. Electronically Signed   By: MRanda NgoM.D.   On: 07/22/2022 21:16   DG CHEST PORT 1 VIEW  Result Date: 07/20/2022 CLINICAL DATA:  Acute on chronic respiratory failure EXAM: PORTABLE CHEST 1 VIEW COMPARISON:  Chest radiograph dated 07/16/2022 FINDINGS: Low lung volumes. Head slightly improved right upper lung aeration. Persistent bilateral lower lung patchy and interstitial opacities. Questionable trace bilateral pleural effusions. No pneumothorax. Similar cardiomediastinal silhouette. The visualized skeletal structures are unremarkable. IMPRESSION: 1. Slightly improved  right upper lung aeration. 2. Persistent bilateral lower lung patchy and interstitial opacities. 3. Questionable trace bilateral pleural effusions. Electronically Signed   By: Darrin Nipper M.D.   On: 07/20/2022 09:26   US RENAL  Result Date: 07/17/2022 CLINICAL DATA:  Elevated creatinine level. EXAM: RENAL / URINARY TRACT ULTRASOUND COMPLETE COMPARISON:  None Available. FINDINGS: Right Kidney: Renal measurements: 9.3 x 5.1 x 3.6 cm = volume: 89 mL. Cortical thinning is noted. Mildly increased echogenicity of renal parenchyma is noted. No mass or hydronephrosis visualized. Left Kidney: Renal measurements: 8.7 x 5.0 x 4.9 cm = volume: 110 mL. Cortical thinning is noted. Mildly increased echogenicity of renal parenchyma is noted. 1.2 cm  simple cyst is seen in upper pole. No mass or hydronephrosis visualized. Bladder: Not well visualized due to lack of distension. Other: None. IMPRESSION: Mildly increased echogenicity of renal parenchyma is noted suggesting medical renal disease. Mild cortical thinning is noted bilaterally as well. No hydronephrosis or renal obstruction is noted. Electronically Signed   By: Marijo Conception M.D.   On: 07/17/2022 11:26   DG CHEST PORT 1 VIEW  Result Date: 07/16/2022 CLINICAL DATA:  Hypoxia. EXAM: PORTABLE CHEST 1 VIEW COMPARISON:  July 10, 2022. FINDINGS: Stable cardiomediastinal silhouette. Increased bilateral lung opacities are noted, right greater than left, concerning for multifocal pneumonia. Continued presence of mildly displaced left clavicular fracture. IMPRESSION: Significantly increased bilateral lung opacities are noted, right greater than left, concerning for multifocal pneumonia. Electronically Signed   By: Marijo Conception M.D.   On: 07/16/2022 14:25   CT Chest Wo Contrast  Result Date: 07/10/2022 CLINICAL DATA:  Pleural effusion, evaluate for malignancy EXAM: CT CHEST WITHOUT CONTRAST TECHNIQUE: Multidetector CT imaging of the chest was performed following the standard protocol without IV contrast. RADIATION DOSE REDUCTION: This exam was performed according to the departmental dose-optimization program which includes automated exposure control, adjustment of the mA and/or kV according to patient size and/or use of iterative reconstruction technique. COMPARISON:  Previous chest radiographs including the examination done on 07/10/2022 and CT chest done on 05/01/2010 FINDINGS: Cardiovascular: Scattered calcifications are seen in thoracic aorta and its major branches. There are small scattered coronary artery calcifications are noted. There is dense calcification in the region of mitral annulus. There is ectasia of main pulmonary artery measuring 3.8 cm suggesting pulmonary arterial hypertension.  There is ectasia of ascending thoracic aorta measuring 3.8 cm. Mediastinum/Nodes: There is slight interval increase in size of lymph nodes in mediastinum measuring up to 10 mm in short axis. In the previous CT, lymph nodes were seen in the mediastinum measuring up to 8.6 mm in short axis measurement. There is 1.4 cm smooth marginated fluid density nodule in the posterior right lobe of thyroid. There is increase in size since the previous study. No follow-up is recommended. Lungs/Pleura: There is ectasia of bronchi in both lungs, more so in the lower lung fields with interval worsening. Increased interstitial markings are seen in the periphery of both lungs, more so in the lower lung fields with interval progression of pulmonary fibrosis. There are scattered faint ground-glass densities in mid and lower lung fields. There is no focal consolidation. There is no pleural effusion or pneumothorax. Upper Abdomen: Small hiatal hernia is seen. Scattered calcifications are seen in abdominal aorta. There is 11 mm exophytic low-density lesion in the posterolateral aspect of upper pole of left kidney suggesting possible renal cyst. No follow-up imaging is recommended. Musculoskeletal: Recent undisplaced fractures are seen in the posterior  aspects of left second and third ribs. Deformities are noted in the anterior/anterolateral aspect of left second, third and fourth ribs, possibly recent fractures. There is encroachment of neural foramina by bony spurs in the visualized lower cervical spine. Small bony spurs are seen in both shoulders. IMPRESSION: Bronchiectasis. Increased interstitial markings are seen in the periphery of both lungs with interval progression suggesting worsening of pulmonary fibrosis. There are faint ground-glass densities in the mid and lower lung fields suggesting interstitial lung disease or interstitial pneumonia. There is no focal pulmonary consolidation. There is no pleural effusion. There is slight  increase in size of lymph nodes in mediastinum, possibly suggesting reactive hyperplasia. There is ectasia of main pulmonary artery suggesting pulmonary arterial hypertension. Scattered coronary artery calcifications are seen. Aortic atherosclerosis. Recent fractures are seen in left second, third and fourth ribs. There is no pneumothorax. Small hiatal hernia. Other findings as described in the body of the report. Electronically Signed   By: Elmer Picker M.D.   On: 07/10/2022 12:29   CT Cervical Spine Wo Contrast  Result Date: 07/10/2022 CLINICAL DATA:  Neck trauma (Age >= 65y) EXAM: CT CERVICAL SPINE WITHOUT CONTRAST TECHNIQUE: Multidetector CT imaging of the cervical spine was performed without intravenous contrast. Multiplanar CT image reconstructions were also generated. RADIATION DOSE REDUCTION: This exam was performed according to the departmental dose-optimization program which includes automated exposure control, adjustment of the mA and/or kV according to patient size and/or use of iterative reconstruction technique. COMPARISON:  10/20/2020. FINDINGS: Alignment: Reversed cervical lordosis noted consistent with sequela of degenerative changes versus muscle spasm or positioning. No suggestion of an acute traumatic etiology for this finding. Skull base and vertebrae: No acute fracture. No primary bone lesion or focal pathologic process. Soft tissues and spinal canal: No prevertebral fluid or swelling. No visible canal hematoma. Disc levels: Multilevel disc disease identified with loss of disc spaces and marginal osteophytes extending from C4 through C7. Upper chest: Negative. IMPRESSION: Degenerative changes. There are no acute traumatic abnormalities identified. Electronically Signed   By: Sammie Bench M.D.   On: 07/10/2022 09:30   CT Head Wo Contrast  Result Date: 07/10/2022 CLINICAL DATA:  Head trauma, intracranial arterial injury suspected EXAM: CT HEAD WITHOUT CONTRAST TECHNIQUE:  Contiguous axial images were obtained from the base of the skull through the vertex without intravenous contrast. RADIATION DOSE REDUCTION: This exam was performed according to the departmental dose-optimization program which includes automated exposure control, adjustment of the mA and/or kV according to patient size and/or use of iterative reconstruction technique. COMPARISON:  10/20/2020 FINDINGS: Brain: There is periventricular white matter decreased attenuation consistent with small vessel ischemic changes. Ventricles, sulci and cisterns are prominent consistent with age related involutional changes. No acute intracranial hemorrhage, mass effect or shift. No hydrocephalus. Vascular: No hyperdense vessel or unexpected calcification. Skull: Normal. Negative for fracture or focal lesion. Sinuses/Orbits: Mild chronic left maxillary sinusitis. IMPRESSION: Atrophy and chronic small vessel ischemic changes. No acute intracranial process identified. Electronically Signed   By: Sammie Bench M.D.   On: 07/10/2022 09:27   DG Hip Unilat W or Wo Pelvis 2-3 Views Left  Result Date: 07/10/2022 CLINICAL DATA:  Fall EXAM: DG HIP (WITH OR WITHOUT PELVIS) 3V LEFT COMPARISON:  10/20/2020. FINDINGS: Osseous structures are osteopenic. Left total hip arthroplasty. Prosthesis appears intact without Periprosthetic lucency. There are acute fractures of the inferior and superior pubic rami on the left displaced by a few mm. The pelvic ring appears otherwise intact. CT may be helpful  to assess for additional radiographically occult fractures. IMPRESSION: Acute left pubic rami fractures. Status post left total hip arthroplasty prosthesis appears intact. Electronically Signed   By: Sammie Bench M.D.   On: 07/10/2022 09:22   DG Chest Port 1 View  Result Date: 07/10/2022 CLINICAL DATA:  Fall EXAM: PORTABLE CHEST 1 VIEW COMPARISON:  01/06/2022 FINDINGS: The cardiac silhouette appears prominent. There is pulmonary vascular  congestion. There is minimal left basilar consolidation or volume loss. There may be small left-sided pleural effusion. No pneumothorax identified. Aorta is calcified. There are thoracic degenerative changes. IMPRESSION: Minimal left basilar consolidation and possible effusion. Pulmonary vascular congestion. Electronically Signed   By: Sammie Bench M.D.   On: 07/10/2022 09:19    Microbiology: Results for orders placed or performed during the hospital encounter of 07/10/22  Urine Culture     Status: Abnormal   Collection Time: 07/10/22 11:37 AM   Specimen: Urine, Clean Catch  Result Value Ref Range Status   Specimen Description   Final    URINE, CLEAN CATCH Performed at [facility], [address], [place], [place] [address]    Special Requests   Final    NONE Performed at [facility], [address] [place], [place] [address]    Culture >=100,000 COLONIES/mL KLEBSIELLA PNEUMONIAE (A)  Final   Report Status 07/13/2022 FINAL  Final   Organism ID, Bacteria KLEBSIELLA PNEUMONIAE (A)  Final      Susceptibility   Klebsiella pneumoniae - MIC*    AMPICILLIN >=32 RESISTANT Resistant     CEFAZOLIN <=4 SENSITIVE Sensitive     CEFEPIME <=0.12 SENSITIVE Sensitive     CEFTRIAXONE <=0.25 SENSITIVE Sensitive     CIPROFLOXACIN <=0.25 SENSITIVE Sensitive     GENTAMICIN <=1 SENSITIVE Sensitive     IMIPENEM <=0.25 SENSITIVE Sensitive     NITROFURANTOIN <=16 SENSITIVE Sensitive     TRIMETH/SULFA <=20 SENSITIVE Sensitive     AMPICILLIN/SULBACTAM 4 SENSITIVE Sensitive     PIP/TAZO <=4 SENSITIVE Sensitive     * >=100,000 COLONIES/mL KLEBSIELLA PNEUMONIAE  SARS Coronavirus 2 by RT PCR (hospital order, performed in [place] hospital lab) *cepheid single result test* Anterior Nasal Swab     Status: Abnormal   Collection Time: 07/13/22  1:50 PM   Specimen: Anterior Nasal Swab  Result Value Ref Range Status   SARS Coronavirus 2 by RT PCR POSITIVE (A) NEGATIVE Final    Comment: (NOTE) SARS-CoV-2  target nucleic acids are DETECTED  SARS-CoV-2 RNA is generally detectable in upper respiratory specimens  during the acute phase of infection.  Positive results are indicative  of the presence of the identified virus, but do not rule out bacterial infection or co-infection with other pathogens not detected by the test.  Clinical correlation with patient history and  other diagnostic information is necessary to determine patient infection status.  The expected result is negative.  Fact Sheet for Patients:   https://www.patel.info/   Fact Sheet for Healthcare Providers:   https://hall.com/    This test is not yet approved or cleared by the [place] FDA and  has been authorized for detection and/or diagnosis of SARS-CoV-2 by FDA under an Emergency Use Authorization (EUA).  This EUA will remain in effect (meaning this test can be used) for the duration of  the COVID-19 declaration under Section 564(b)(1)  of the Act, 21 U.S.C. section 360-bbb-3(b)(1), unless the authorization is terminated or revoked sooner.   Performed at [facility], [address], [place], [place] [address]  MRSA Next Gen by PCR, Nasal     Status: Abnormal   Collection Time: 07/18/22  7:57 PM   Specimen: Nasal Mucosa; Nasal Swab  Result Value Ref Range Status   MRSA by PCR Next Gen DETECTED (A) NOT DETECTED Final    Comment: CRITICAL RESULT CALLED TO, READ BACK BY AND VERIFIED WITH: L. HYLTON BY LBASTON AT 1721, 07/19/22. (NOTE) The GeneXpert MRSA Assay (FDA approved for NASAL specimens only), is one component of a comprehensive MRSA colonization surveillance program. It is not intended to diagnose MRSA infection nor to guide or monitor treatment for MRSA infections. Test performance is not FDA approved in patients less than 47 years old. Performed at [facility], [address] [place],  [address]     Labs: CBC: Recent Labs  Lab 07/18/22 0501  07/19/22 0333 07/21/22 0409 07/22/22 1318  WBC 12.1* 12.3* 15.1* 26.4*  NEUTROABS 10.8* 11.1*  --   --   HGB 7.4* 7.5* 7.8* 7.6*  HCT 23.4* 23.5* 25.9* 24.4*  MCV 89.3 90.4 95.9 92.4  PLT 175 185 185 779   Basic Metabolic Panel: Recent Labs  Lab 07/19/22 0333 07/20/22 0311 07/21/22 0409 07/22/22 1318 07/23/22 0720  NA 140 142 146* 146* 143  K 3.7 4.3 4.8 4.0 3.9  CL 105 107 114* 109 113*  CO2 '26 28 24 25 24  '$ GLUCOSE 227* 274* 179* 171* 191*  BUN 63* 73* 53* 50* 46*  CREATININE 1.25* 1.28* 1.00 1.07* 1.16*  CALCIUM 7.5* 7.7* 7.8* 8.0* 7.7*  MG  --  2.9*  --  2.6* 2.7*  PHOS  --  3.3  --   --   --    Liver Function Tests: Recent Labs  Lab 07/18/22 0501  AST 27  ALT 20  ALKPHOS 98  BILITOT 0.9  PROT 6.8  ALBUMIN 2.3*   CBG: Recent Labs  Lab 07/23/22 0000 07/23/22 0453 07/23/22 0803 07/23/22 1110 07/23/22 1229  GLUCAP 421* 256* 167* 126*  126* 100*    Discharge time spent: greater than 30 minutes.  Signed: Orson Eva, MD [facility] Jul 25, 2022

## 2022-08-10 NOTE — Death Summary Note (Signed)
DEATH SUMMARY   Patient Details  Name: Sheena [name] MRN: [id number] DOB: [birth year] BPZ:WCHEN, [name], MD Admission/Discharge Information   Admit Date:  08/06/2022  Date of Death: Date of Death: 08-06-22  Time of Death: Time of Death: 1920  Length of Stay: 1   Principle Cause of death: acute respiratory failure due to COVID-19    Hospital Course: 89 y.o. female with pmhx relevant for depression/demention, HTN , DM2.Marland Kitchen  COPD admitted on 07/10/2022 from [facility] [place] [facility] with Lt hip pain after mechanical fall and found to have pubic rami fractures, rib fracture, also found to + covid, worsening hypoxia.  She required up to 10L HFNC.  She was started on IV steroid and completed a course of Paxlovid.  She continues to remain deconditioned with desaturation with minimal activity and eating.  The patient remained hypoxemic.  She had very little reserve.  With minimal activity such as eating or moving in bed, the patient would desaturate her oxygen.  In addition, the patient was very resistant to any kind of movement or therapy secondary to pain.  Her oral intake remained extremely poor.  Workup was done to rule out other treatable causes and metabolic issues.  The patient continued to deteriorate slowly.  Palliative medicine was consulted.  Goals of care discussions were undertaken.  Given the patient's frailty, poor baseline function, and severity of her acute medical issues, it was felt that she had low likelihood to regain any level of function near her baseline previously.  After discussion with the patient's family, he was felt in the best interest to transition the patient to focus care on comfort measures.  [facility].  Unfortunately, the patient had deteriorated and was not stable for transfer to [place].  She was subsequently transition to general inpatient hospice admission.  Comfort measures were continued.   Assessment and  Plan: COVID-19 infection with acute on chronic hypoxic respiratory failure with hypoxia- -she was tested + on 12/4 -reports on home o2 at [facility] for a few years  -IV steroids and bronchodilators as ordered -Paxlovid on 07/13/22-07/18/22 -increased oxygen requirement on 12/7, repeat cxr showed worsening of pneumonia,  started back on abx, continue steroids,  prn iv lasix -o2 requirement up to 10 liters for the last few days, she does not appear in significant respiratory distress, crp slowing coming down -discussed with daughter [name] and HPOA [name] may consider comfort measures if she deteriorate, they are in agreement -check CRP 7.8>>12.4 -check D-dimer 7.95 -CTA chest--no PE.  Bilateral multifocal airspace disease -PCT <0.10 -No further workup as the patient's focus of care has been transitioned to focus on comfort measures.   Acute metabolic Encephalopathy -due to hypoxia, infection, UTI   Klebsiella UTI ,POA -finished treatment with IV Rocephin -last dose 07/15/2022   Acute Left pubic rami fractures----after mechanical fall (POA) -Outpatient follow-up with orthopedic  -Pain control appears adequate -SNF placement was the initial plan but may change depending on palliative discussions    Lt sided rib fractures (POA) from the fall, does not appear in pain    Controlled Diabetes mellitus type 2 -07/10/22 A1C--5.9 -episodes of hypoglycemia , insulin dose decrease, continue to adjust insulin dose as needed -currently on steroids   HTN- bp stable    Anemia of chronic disease Hgb is down to 7.1 from 9.8 - no bleeding concerns at this time monitor closely -check anemia panel   Dementia and depression--- continue trazodone as needed,  continue Zoloft 75 mg daily and Seroquel 12.5 mg No agitation, no oriented to time ,but to place and person    Recurrent Falls--- at baseline patient ambulates with a walker from ALF -presents with  a mechanical fall- -CT head and CT C-spine  without acute findings PTA pt lived at [facility]F and did very poorly, patient has significant limitations with mobility related ADLs- this patient needs to continue to be monitored in the hospital until a SNF bed is obtained as she is not safe to go home with her current physical limitations -daughter desires palliative care to follow patient at snf if able to discharge   [place] -discussed with daughter and grandson (HPOA)  -discussed pt's frailty and FTT and poor po intake and poor baseline function -discussed pt's slow and lack of improvement clinically and prolonged hospitalization -discussed that patient will not likely return to her baseline functional state -they agreed to transition patient's focus of care to comfort measures           Procedures: none  Consultations: palliative medicine  The results of significant diagnostics from this hospitalization (including imaging, microbiology, ancillary and laboratory) are listed below for reference.   Significant Diagnostic Studies: CT Angio Chest Pulmonary Embolism (PE) W or WO Contrast  Result Date: 07/22/2022 CLINICAL DATA:  COVID positive, hypoxia, elevated D-dimer EXAM: CT ANGIOGRAPHY CHEST WITH CONTRAST TECHNIQUE: Multidetector CT imaging of the chest was performed using the standard protocol during bolus administration of intravenous contrast. Multiplanar CT image reconstructions and MIPs were obtained to evaluate the vascular anatomy. RADIATION DOSE REDUCTION: This exam was performed according to the departmental dose-optimization program which includes automated exposure control, adjustment of the mA and/or kV according to patient size and/or use of iterative reconstruction technique. CONTRAST:  148m OMNIPAQUE IOHEXOL 350 MG/ML SOLN COMPARISON:  07/20/2022, 07/10/2022 FINDINGS: Cardiovascular: This is a technically adequate evaluation of the pulmonary vasculature. No filling defects or pulmonary emboli. Dilated main pulmonary  artery consistent with pulmonary arterial hypertension. Stable cardiomegaly without pericardial effusion. Stable aortic atherosclerosis. No aneurysm or dissection. Mediastinum/Nodes: Mediastinal adenopathy likely reactive. Largest lymph node right paratracheal region measures 12 mm in short axis. Thyroid, trachea, and esophagus are unremarkable. Lungs/Pleura: Chronic bilateral bronchiectasis and bibasilar scarring. There is diffuse bilateral airspace disease with relative sparing of the left upper lobe. No effusion or pneumothorax. Central airways are patent. Upper Abdomen: No acute abnormality. Musculoskeletal: Multiple left-sided rib fractures unchanged. Reconstructed images demonstrate no additional findings. Review of the MIP images confirms the above findings. IMPRESSION: 1. No evidence of pulmonary embolus. 2. Multifocal bilateral airspace disease, with relative sparing of the left upper lobe, consistent with bilateral pneumonia. The appearance and pattern is consistent with given history of COVID-19. 3. Reactive mediastinal adenopathy. 4. Dilated main pulmonary artery consistent with pulmonary arterial hypertension. 5. Chronic bilateral bronchiectasis and bibasilar scarring. 6.  Aortic Atherosclerosis (ICD10-I70.0). 7. Stable left-sided rib fractures. Electronically Signed   By: MRanda NgoM.D.   On: 07/22/2022 21:16   DG CHEST PORT 1 VIEW  Result Date: 07/20/2022 CLINICAL DATA:  Acute on chronic respiratory failure EXAM: PORTABLE CHEST 1 VIEW COMPARISON:  Chest radiograph dated 07/16/2022 FINDINGS: Low lung volumes. Head slightly improved right upper lung aeration. Persistent bilateral lower lung patchy and interstitial opacities. Questionable trace bilateral pleural effusions. No pneumothorax. Similar cardiomediastinal silhouette. The visualized skeletal structures are unremarkable. IMPRESSION: 1. Slightly improved right upper lung aeration. 2. Persistent bilateral lower lung patchy and  interstitial opacities. 3. Questionable trace bilateral pleural  effusions. Electronically Signed   By: Darrin Nipper M.D.   On: 07/20/2022 09:26   US RENAL  Result Date: 07/17/2022 CLINICAL DATA:  Elevated creatinine level. EXAM: RENAL / URINARY TRACT ULTRASOUND COMPLETE COMPARISON:  None Available. FINDINGS: Right Kidney: Renal measurements: 9.3 x 5.1 x 3.6 cm = volume: 89 mL. Cortical thinning is noted. Mildly increased echogenicity of renal parenchyma is noted. No mass or hydronephrosis visualized. Left Kidney: Renal measurements: 8.7 x 5.0 x 4.9 cm = volume: 110 mL. Cortical thinning is noted. Mildly increased echogenicity of renal parenchyma is noted. 1.2 cm simple cyst is seen in upper pole. No mass or hydronephrosis visualized. Bladder: Not well visualized due to lack of distension. Other: None. IMPRESSION: Mildly increased echogenicity of renal parenchyma is noted suggesting medical renal disease. Mild cortical thinning is noted bilaterally as well. No hydronephrosis or renal obstruction is noted. Electronically Signed   By: Marijo Conception M.D.   On: 07/17/2022 11:26   DG CHEST PORT 1 VIEW  Result Date: 07/16/2022 CLINICAL DATA:  Hypoxia. EXAM: PORTABLE CHEST 1 VIEW COMPARISON:  July 10, 2022. FINDINGS: Stable cardiomediastinal silhouette. Increased bilateral lung opacities are noted, right greater than left, concerning for multifocal pneumonia. Continued presence of mildly displaced left clavicular fracture. IMPRESSION: Significantly increased bilateral lung opacities are noted, right greater than left, concerning for multifocal pneumonia. Electronically Signed   By: Marijo Conception M.D.   On: 07/16/2022 14:25   CT Chest Wo Contrast  Result Date: 07/10/2022 CLINICAL DATA:  Pleural effusion, evaluate for malignancy EXAM: CT CHEST WITHOUT CONTRAST TECHNIQUE: Multidetector CT imaging of the chest was performed following the standard protocol without IV contrast. RADIATION DOSE REDUCTION: This exam  was performed according to the departmental dose-optimization program which includes automated exposure control, adjustment of the mA and/or kV according to patient size and/or use of iterative reconstruction technique. COMPARISON:  Previous chest radiographs including the examination done on 07/10/2022 and CT chest done on 05/01/2010 FINDINGS: Cardiovascular: Scattered calcifications are seen in thoracic aorta and its major branches. There are small scattered coronary artery calcifications are noted. There is dense calcification in the region of mitral annulus. There is ectasia of main pulmonary artery measuring 3.8 cm suggesting pulmonary arterial hypertension. There is ectasia of ascending thoracic aorta measuring 3.8 cm. Mediastinum/Nodes: There is slight interval increase in size of lymph nodes in mediastinum measuring up to 10 mm in short axis. In the previous CT, lymph nodes were seen in the mediastinum measuring up to 8.6 mm in short axis measurement. There is 1.4 cm smooth marginated fluid density nodule in the posterior right lobe of thyroid. There is increase in size since the previous study. No follow-up is recommended. Lungs/Pleura: There is ectasia of bronchi in both lungs, more so in the lower lung fields with interval worsening. Increased interstitial markings are seen in the periphery of both lungs, more so in the lower lung fields with interval progression of pulmonary fibrosis. There are scattered faint ground-glass densities in mid and lower lung fields. There is no focal consolidation. There is no pleural effusion or pneumothorax. Upper Abdomen: Small hiatal hernia is seen. Scattered calcifications are seen in abdominal aorta. There is 11 mm exophytic low-density lesion in the posterolateral aspect of upper pole of left kidney suggesting possible renal cyst. No follow-up imaging is recommended. Musculoskeletal: Recent undisplaced fractures are seen in the posterior aspects of left second and  third ribs. Deformities are noted in the anterior/anterolateral aspect of left second, third  and fourth ribs, possibly recent fractures. There is encroachment of neural foramina by bony spurs in the visualized lower cervical spine. Small bony spurs are seen in both shoulders. IMPRESSION: Bronchiectasis. Increased interstitial markings are seen in the periphery of both lungs with interval progression suggesting worsening of pulmonary fibrosis. There are faint ground-glass densities in the mid and lower lung fields suggesting interstitial lung disease or interstitial pneumonia. There is no focal pulmonary consolidation. There is no pleural effusion. There is slight increase in size of lymph nodes in mediastinum, possibly suggesting reactive hyperplasia. There is ectasia of main pulmonary artery suggesting pulmonary arterial hypertension. Scattered coronary artery calcifications are seen. Aortic atherosclerosis. Recent fractures are seen in left second, third and fourth ribs. There is no pneumothorax. Small hiatal hernia. Other findings as described in the body of the report. Electronically Signed   By: Elmer Picker M.D.   On: 07/10/2022 12:29   CT Cervical Spine Wo Contrast  Result Date: 07/10/2022 CLINICAL DATA:  Neck trauma (Age >= 65y) EXAM: CT CERVICAL SPINE WITHOUT CONTRAST TECHNIQUE: Multidetector CT imaging of the cervical spine was performed without intravenous contrast. Multiplanar CT image reconstructions were also generated. RADIATION DOSE REDUCTION: This exam was performed according to the departmental dose-optimization program which includes automated exposure control, adjustment of the mA and/or kV according to patient size and/or use of iterative reconstruction technique. COMPARISON:  10/20/2020. FINDINGS: Alignment: Reversed cervical lordosis noted consistent with sequela of degenerative changes versus muscle spasm or positioning. No suggestion of an acute traumatic etiology for this  finding. Skull base and vertebrae: No acute fracture. No primary bone lesion or focal pathologic process. Soft tissues and spinal canal: No prevertebral fluid or swelling. No visible canal hematoma. Disc levels: Multilevel disc disease identified with loss of disc spaces and marginal osteophytes extending from C4 through C7. Upper chest: Negative. IMPRESSION: Degenerative changes. There are no acute traumatic abnormalities identified. Electronically Signed   By: Sammie Bench M.D.   On: 07/10/2022 09:30   CT Head Wo Contrast  Result Date: 07/10/2022 CLINICAL DATA:  Head trauma, intracranial arterial injury suspected EXAM: CT HEAD WITHOUT CONTRAST TECHNIQUE: Contiguous axial images were obtained from the base of the skull through the vertex without intravenous contrast. RADIATION DOSE REDUCTION: This exam was performed according to the departmental dose-optimization program which includes automated exposure control, adjustment of the mA and/or kV according to patient size and/or use of iterative reconstruction technique. COMPARISON:  10/20/2020 FINDINGS: Brain: There is periventricular white matter decreased attenuation consistent with small vessel ischemic changes. Ventricles, sulci and cisterns are prominent consistent with age related involutional changes. No acute intracranial hemorrhage, mass effect or shift. No hydrocephalus. Vascular: No hyperdense vessel or unexpected calcification. Skull: Normal. Negative for fracture or focal lesion. Sinuses/Orbits: Mild chronic left maxillary sinusitis. IMPRESSION: Atrophy and chronic small vessel ischemic changes. No acute intracranial process identified. Electronically Signed   By: Sammie Bench M.D.   On: 07/10/2022 09:27   DG Hip Unilat W or Wo Pelvis 2-3 Views Left  Result Date: 07/10/2022 CLINICAL DATA:  Fall EXAM: DG HIP (WITH OR WITHOUT PELVIS) 3V LEFT COMPARISON:  10/20/2020. FINDINGS: Osseous structures are osteopenic. Left total hip arthroplasty.  Prosthesis appears intact without Periprosthetic lucency. There are acute fractures of the inferior and superior pubic rami on the left displaced by a few mm. The pelvic ring appears otherwise intact. CT may be helpful to assess for additional radiographically occult fractures. IMPRESSION: Acute left pubic rami fractures. Status post left total hip  arthroplasty prosthesis appears intact. Electronically Signed   By: Sammie Bench M.D.   On: 07/10/2022 09:22   DG Chest Port 1 View  Result Date: 07/10/2022 CLINICAL DATA:  Fall EXAM: PORTABLE CHEST 1 VIEW COMPARISON:  01/06/2022 FINDINGS: The cardiac silhouette appears prominent. There is pulmonary vascular congestion. There is minimal left basilar consolidation or volume loss. There may be small left-sided pleural effusion. No pneumothorax identified. Aorta is calcified. There are thoracic degenerative changes. IMPRESSION: Minimal left basilar consolidation and possible effusion. Pulmonary vascular congestion. Electronically Signed   By: Sammie Bench M.D.   On: 07/10/2022 09:19    Microbiology: Recent Results (from the past 240 hour(s))  MRSA Next Gen by PCR, Nasal     Status: Abnormal   Collection Time: 07/18/22  7:57 PM   Specimen: Nasal Mucosa; Nasal Swab  Result Value Ref Range Status   MRSA by PCR Next Gen DETECTED (A) NOT DETECTED Final    Comment: CRITICAL RESULT CALLED TO, READ BACK BY AND VERIFIED WITH: L. HYLTON BY LBASTON AT 1721, 07/19/22. (NOTE) The GeneXpert MRSA Assay (FDA approved for NASAL specimens only), is one component of a comprehensive MRSA colonization surveillance program. It is not intended to diagnose MRSA infection nor to guide or monitor treatment for MRSA infections. Test performance is not FDA approved in patients less than 38 years old. Performed at [facility], [address], [place], [place] [address]       Signed: Orson Eva, MD August 13, 2022

## 2022-08-10 NOTE — Progress Notes (Addendum)
Time of Death 57.  Two nurse verification. Charge nurse notified. MD notified. Hospice notified.

## 2022-08-10 NOTE — TOC Transition Note (Addendum)
Transition of Care [facility]) - CM/SW Discharge Note   Patient Details  Name: Sheena [name] MRN: [id number] Date of Birth: 1924/06/10  Transition of [facility] CM/SW Contact:  Shade Flood, LCSW Phone Number: 08/20/2022, 1:07 PM   Clinical Narrative:     Pt accepted for care at [facility]. Hospice staff will arrange EMS. RN to call report. Family aware and in agreement.  No other TOC needs for dc.  1351: Per MD and RN, pt declining and not stable to transfer. Updated Samantha at [facility]. They are going to see if they have an RN available to admit to GIP status. If they do not, hospice referral will be cancelled.  Final next level of care: [place] Barriers to Discharge: Barriers Resolved   Patient Goals and CMS Choice Patient states their goals for this hospitalization and ongoing recovery are:: SNF CMS Medicare.gov Compare Post Acute Care list provided to:: Patient Represenative (must comment) Choice offered to / list presented to : Adult Children  Discharge Placement              Patient chooses bed at: Other - please specify in the comment section below: ([place]) Patient to be transferred to facility by: EMS Name of family member notified: [name] Patient and family notified of of transfer: 20-Aug-2022  Discharge Plan and Services In-house Referral: Clinical Social Work Discharge Planning Services: CM Consult Post Acute Care Choice: Hospice                               Social Determinants of Health (SDOH) Interventions Housing Interventions: Intervention Not Indicated   Readmission Risk Interventions    07/13/2022   11:23 AM  Readmission Risk Prevention Plan  Transportation Screening Complete  PCP or Specialist Appt within 3-5 Days Complete  HRI or [place] Complete  Social Work Consult for [place] Planning/Counseling Complete  Palliative Care Screening Not Applicable  Medication Review Press photographer) Complete

## 2022-08-10 DEATH — deceased
